# Patient Record
Sex: Male | Born: 1950
Health system: Southern US, Community
[De-identification: ages and names within clinical notes are randomized; demographics above are authoritative.]

## PROBLEM LIST (undated history)

## (undated) DIAGNOSIS — Z87438 Personal history of other diseases of male genital organs: Secondary | ICD-10-CM

## (undated) DIAGNOSIS — E871 Hypo-osmolality and hyponatremia: Secondary | ICD-10-CM

## (undated) DIAGNOSIS — K7460 Unspecified cirrhosis of liver: Secondary | ICD-10-CM

## (undated) DIAGNOSIS — E538 Deficiency of other specified B group vitamins: Secondary | ICD-10-CM

## (undated) DIAGNOSIS — F17200 Nicotine dependence, unspecified, uncomplicated: Secondary | ICD-10-CM

## (undated) DIAGNOSIS — F1911 Other psychoactive substance abuse, in remission: Secondary | ICD-10-CM

## (undated) DIAGNOSIS — M7021 Olecranon bursitis, right elbow: Secondary | ICD-10-CM

## (undated) DIAGNOSIS — M159 Polyosteoarthritis, unspecified: Secondary | ICD-10-CM

## (undated) DIAGNOSIS — I509 Heart failure, unspecified: Secondary | ICD-10-CM

## (undated) DIAGNOSIS — F329 Major depressive disorder, single episode, unspecified: Secondary | ICD-10-CM

## (undated) DIAGNOSIS — J449 Chronic obstructive pulmonary disease, unspecified: Secondary | ICD-10-CM

## (undated) DIAGNOSIS — I480 Paroxysmal atrial fibrillation: Secondary | ICD-10-CM

## (undated) DIAGNOSIS — I10 Essential (primary) hypertension: Secondary | ICD-10-CM

## (undated) DIAGNOSIS — N401 Enlarged prostate with lower urinary tract symptoms: Secondary | ICD-10-CM

## (undated) DIAGNOSIS — R5381 Other malaise: Secondary | ICD-10-CM

## (undated) DIAGNOSIS — C801 Malignant (primary) neoplasm, unspecified: Secondary | ICD-10-CM

## (undated) DIAGNOSIS — Z8719 Personal history of other diseases of the digestive system: Secondary | ICD-10-CM

## (undated) DIAGNOSIS — R45851 Suicidal ideations: Secondary | ICD-10-CM

## (undated) DIAGNOSIS — L0292 Furuncle, unspecified: Secondary | ICD-10-CM

## (undated) DIAGNOSIS — N529 Male erectile dysfunction, unspecified: Secondary | ICD-10-CM

## (undated) DIAGNOSIS — F1020 Alcohol dependence, uncomplicated: Secondary | ICD-10-CM

## (undated) DIAGNOSIS — F32A Depression, unspecified: Secondary | ICD-10-CM

## (undated) DIAGNOSIS — I5189 Other ill-defined heart diseases: Secondary | ICD-10-CM

## (undated) DIAGNOSIS — I951 Orthostatic hypotension: Secondary | ICD-10-CM

## (undated) DIAGNOSIS — N138 Other obstructive and reflux uropathy: Secondary | ICD-10-CM

## (undated) DIAGNOSIS — Z8619 Personal history of other infectious and parasitic diseases: Secondary | ICD-10-CM

## (undated) DIAGNOSIS — F419 Anxiety disorder, unspecified: Secondary | ICD-10-CM

## (undated) DIAGNOSIS — I4891 Unspecified atrial fibrillation: Secondary | ICD-10-CM

## (undated) DIAGNOSIS — R972 Elevated prostate specific antigen [PSA]: Secondary | ICD-10-CM

## (undated) DIAGNOSIS — N200 Calculus of kidney: Secondary | ICD-10-CM

## (undated) DIAGNOSIS — J45909 Unspecified asthma, uncomplicated: Secondary | ICD-10-CM

## (undated) DIAGNOSIS — M1990 Unspecified osteoarthritis, unspecified site: Secondary | ICD-10-CM

## (undated) HISTORY — DX: Male erectile dysfunction, unspecified: N52.9

## (undated) HISTORY — DX: Other ill-defined heart diseases: I51.89

## (undated) HISTORY — DX: Depression, unspecified: F32.A

## (undated) HISTORY — DX: Deficiency of other specified B group vitamins: E53.8

## (undated) HISTORY — DX: Calculus of kidney: N20.0

## (undated) HISTORY — DX: Furuncle, unspecified: L02.92

## (undated) HISTORY — DX: Alcohol dependence, uncomplicated: F10.20

## (undated) HISTORY — DX: Other malaise: R53.81

## (undated) HISTORY — DX: Orthostatic hypotension: I95.1

## (undated) HISTORY — DX: Chronic obstructive pulmonary disease, unspecified: J44.9

## (undated) HISTORY — DX: Anxiety disorder, unspecified: F41.9

## (undated) HISTORY — DX: Unspecified cirrhosis of liver: K74.60

## (undated) HISTORY — DX: Elevated prostate specific antigen (PSA): R97.20

## (undated) HISTORY — DX: Major depressive disorder, single episode, unspecified: F32.9

## (undated) HISTORY — DX: Polyosteoarthritis, unspecified: M15.9

## (undated) HISTORY — DX: Personal history of other diseases of male genital organs: Z87.438

## (undated) HISTORY — DX: Other obstructive and reflux uropathy: N40.1

## (undated) HISTORY — DX: Hypo-osmolality and hyponatremia: E87.1

## (undated) HISTORY — DX: Benign prostatic hyperplasia with lower urinary tract symptoms: N13.8

## (undated) HISTORY — DX: Nicotine dependence, unspecified, uncomplicated: F17.200

## (undated) HISTORY — PX: HIP FRACTURE SURGERY: SHX118

## (undated) HISTORY — DX: Personal history of other infectious and parasitic diseases: Z86.19

## (undated) HISTORY — DX: Suicidal ideations: R45.851

## (undated) HISTORY — DX: Unspecified atrial fibrillation: I48.91

## (undated) HISTORY — DX: Essential (primary) hypertension: I10

## (undated) HISTORY — DX: Other psychoactive substance abuse, in remission: F19.11

## (undated) HISTORY — DX: Paroxysmal atrial fibrillation: I48.0

---

## 2001-03-22 ENCOUNTER — Inpatient Hospital Stay (HOSPITAL_COMMUNITY): Admission: EM | Admit: 2001-03-22 | Discharge: 2001-03-25 | Payer: Self-pay | Admitting: *Deleted

## 2010-12-17 ENCOUNTER — Emergency Department (HOSPITAL_COMMUNITY): Payer: Self-pay

## 2010-12-17 ENCOUNTER — Emergency Department (HOSPITAL_COMMUNITY)
Admission: EM | Admit: 2010-12-17 | Discharge: 2010-12-18 | Disposition: A | Discharge status: Home / Self Care | Payer: Self-pay | Attending: Emergency Medicine | Admitting: Emergency Medicine

## 2010-12-17 DIAGNOSIS — IMO0002 Reserved for concepts with insufficient information to code with codable children: Secondary | ICD-10-CM | POA: Insufficient documentation

## 2010-12-17 DIAGNOSIS — W19XXXA Unspecified fall, initial encounter: Secondary | ICD-10-CM | POA: Insufficient documentation

## 2010-12-17 DIAGNOSIS — M7989 Other specified soft tissue disorders: Secondary | ICD-10-CM | POA: Insufficient documentation

## 2010-12-17 DIAGNOSIS — M79609 Pain in unspecified limb: Secondary | ICD-10-CM | POA: Insufficient documentation

## 2010-12-17 DIAGNOSIS — F1010 Alcohol abuse, uncomplicated: Secondary | ICD-10-CM | POA: Insufficient documentation

## 2010-12-17 DIAGNOSIS — S5010XA Contusion of unspecified forearm, initial encounter: Secondary | ICD-10-CM | POA: Insufficient documentation

## 2010-12-17 DIAGNOSIS — I10 Essential (primary) hypertension: Secondary | ICD-10-CM | POA: Insufficient documentation

## 2010-12-17 DIAGNOSIS — F341 Dysthymic disorder: Secondary | ICD-10-CM | POA: Insufficient documentation

## 2010-12-17 DIAGNOSIS — Y929 Unspecified place or not applicable: Secondary | ICD-10-CM | POA: Insufficient documentation

## 2010-12-17 LAB — POCT I-STAT, CHEM 8
Chloride: 92 mEq/L — ABNORMAL LOW (ref 96–112)
Creatinine, Ser: 1.3 mg/dL (ref 0.4–1.5)
Glucose, Bld: 91 mg/dL (ref 70–99)
Potassium: 3.7 mEq/L (ref 3.5–5.1)

## 2010-12-17 IMAGING — CR DG FOREARM 2V*R*
2 series · 2 of 2 positions shown · non-contrast
Comparison: None.

CLINICAL DATA: Fall, pain.  Laceration.

RIGHT FOREARM - 2 VIEW

[view not recorded (1 of 2)]
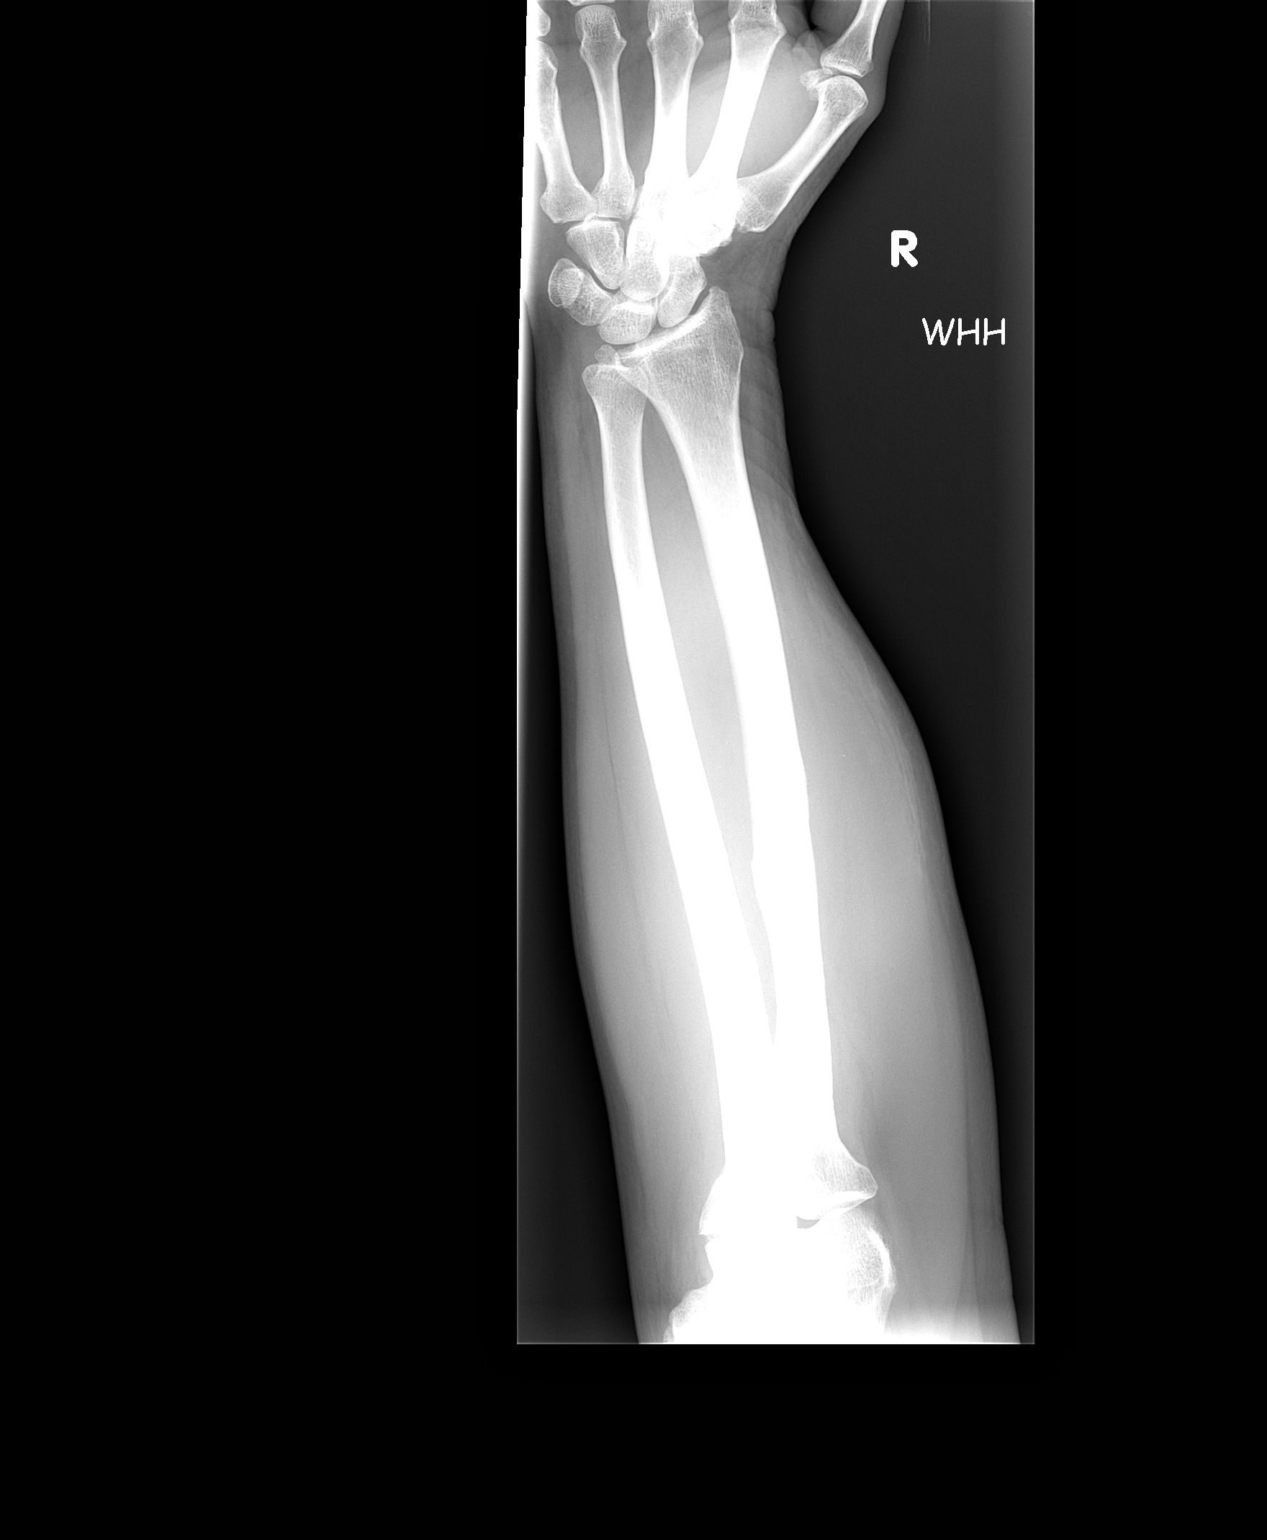

[view not recorded (2 of 2)]
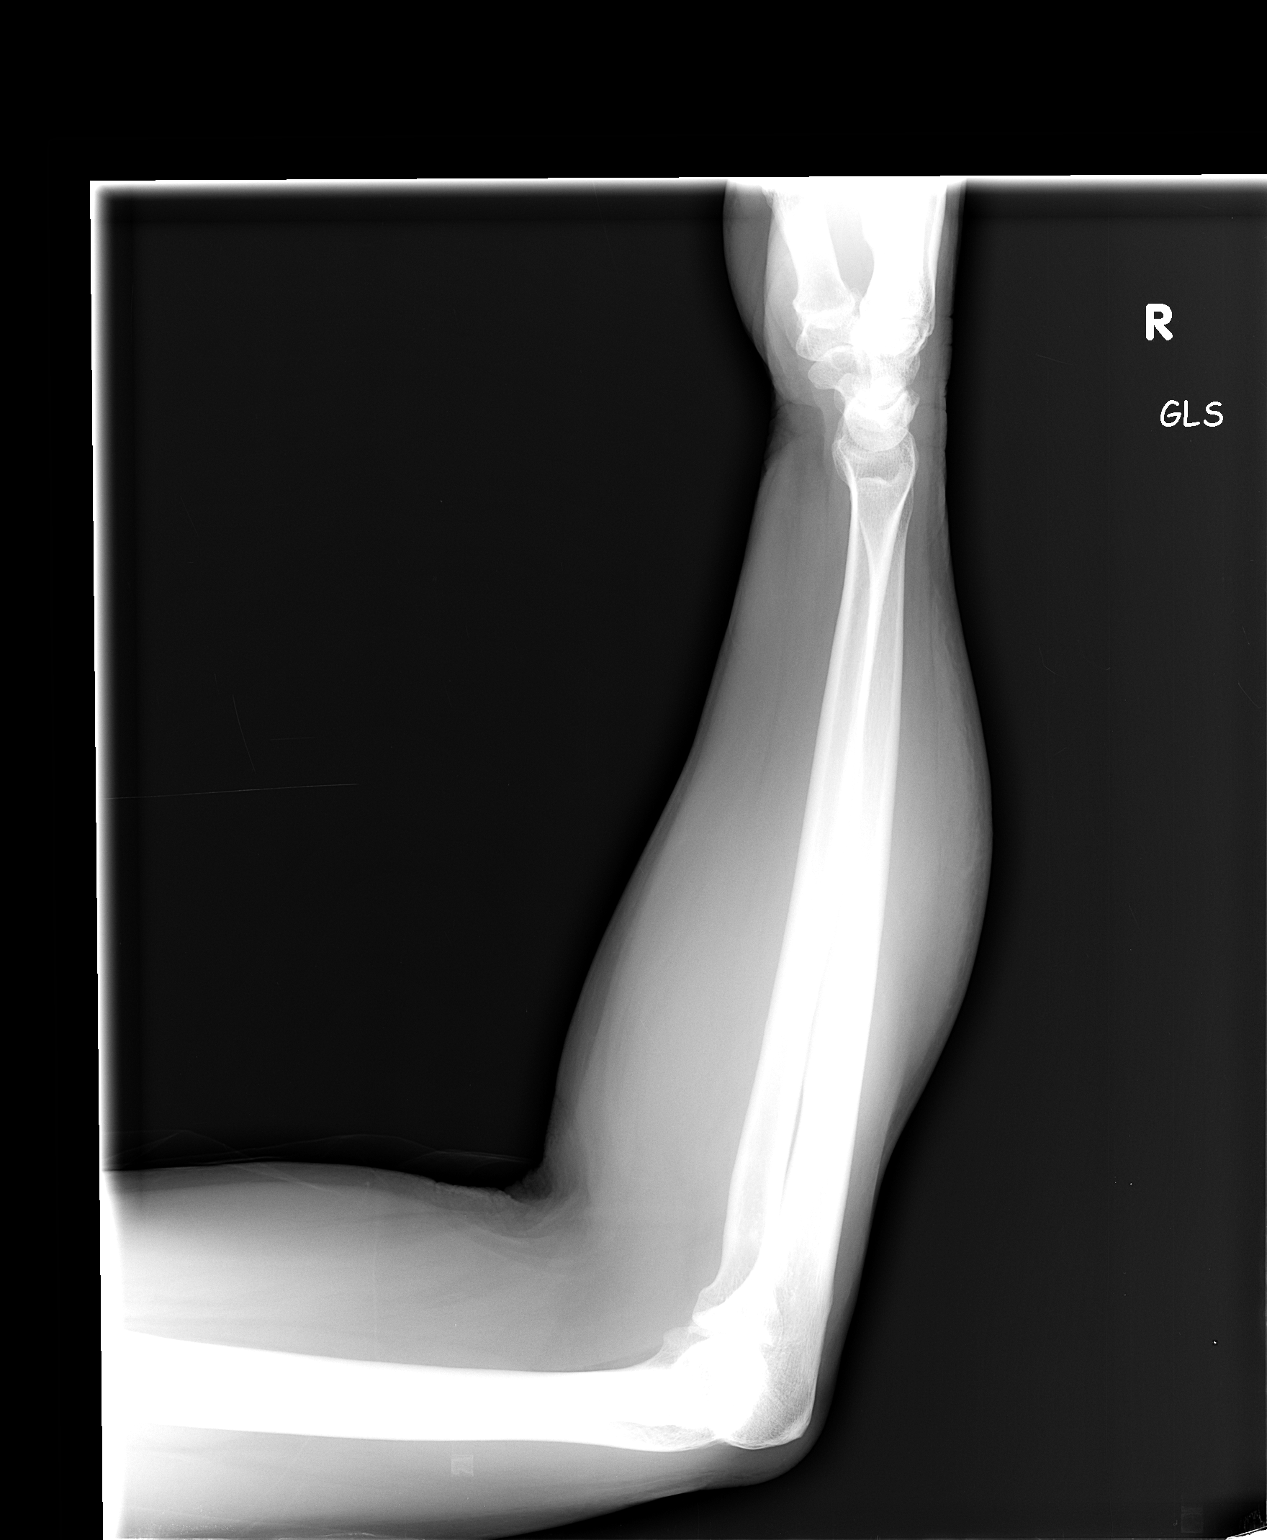

[2 of 2 positions shown; findings below may reference images not displayed]

FINDINGS: There is marked soft tissue swelling over the dorsum of
the mid forearm.  No underlying fracture or foreign body.
IMPRESSION: Soft tissue swelling.  No acute bony abnormality.

## 2012-02-06 ENCOUNTER — Ambulatory Visit (INDEPENDENT_AMBULATORY_CARE_PROVIDER_SITE_OTHER): Payer: Self-pay | Admitting: Family Medicine

## 2012-02-06 ENCOUNTER — Encounter: Payer: Self-pay | Admitting: Family Medicine

## 2012-02-06 VITALS — BP 112/77 | HR 71 | Temp 98.3°F | Wt 181.0 lb

## 2012-02-06 DIAGNOSIS — J441 Chronic obstructive pulmonary disease with (acute) exacerbation: Secondary | ICD-10-CM | POA: Insufficient documentation

## 2012-02-06 DIAGNOSIS — E538 Deficiency of other specified B group vitamins: Secondary | ICD-10-CM

## 2012-02-06 DIAGNOSIS — I10 Essential (primary) hypertension: Secondary | ICD-10-CM

## 2012-02-06 DIAGNOSIS — J449 Chronic obstructive pulmonary disease, unspecified: Secondary | ICD-10-CM | POA: Insufficient documentation

## 2012-02-06 DIAGNOSIS — J4489 Other specified chronic obstructive pulmonary disease: Secondary | ICD-10-CM

## 2012-02-06 DIAGNOSIS — F17200 Nicotine dependence, unspecified, uncomplicated: Secondary | ICD-10-CM

## 2012-02-06 MED ORDER — PREDNISONE 20 MG PO TABS: ORAL_TABLET | ORAL | Status: DC

## 2012-02-06 MED ORDER — CYANOCOBALAMIN 1000 MCG/ML IJ SOLN: 1000.0000 ug | Freq: Once | INTRAMUSCULAR | Status: DC

## 2012-02-06 MED ORDER — AZITHROMYCIN 250 MG PO TABS: ORAL_TABLET | ORAL | Status: DC

## 2012-02-06 NOTE — Assessment & Plan Note (Signed)
Improved aeration/wheezing with one albuterol/atrovent (from sample closet) neb in office today. He clearly needs steroids: rx'd 15 d prednisone taper--60 qd x 5d, then 40 qd x 5d, then 20 qd x 5d. Z-pack rx'd as well.  Continue ventolin HFA q4h prn.  Call or return if not improving in 48h.

## 2012-02-06 NOTE — Assessment & Plan Note (Signed)
Poor control, pt refusal to comply with recommendations of chronic/daily preventative meds (like inhaled steroid/bronchodilator combo, spireva, etc) due to cost.  Pt refuses to quit smoking.  I stressed the importance of quitting smoking today in no uncertain terms.  I told him his COPD is uncontrolled and the best way to have a chance of getting better is to quit smoking.  He expressed understanding but made no commitment to a quit attempt.  Will continue to encourage him, continue to work on getting him to accept a daily controller med--at least samples in the beginning and maybe patient assistance beyond that.

## 2012-02-06 NOTE — Assessment & Plan Note (Signed)
Control good as per his bp check here today. Will obtain old records. No change in meds today.  No labs today.

## 2012-02-06 NOTE — Patient Instructions (Signed)
Buy robitussin DM over the counter cough med and take as directed.

## 2012-02-06 NOTE — Progress Notes (Signed)
Office Note 02/06/2012  CC:  Chief Complaint  Patient presents with  . Establish Care    COPD flare    HPI:  Jacob Frank is a 61 y.o. White male who is here to establish care and discuss respiratory complaints. Patient's most recent primary MD: Dr. Milford Cage at [facility] in [place], [place].  I also saw him at that same office when I worked there a couple of years ago, so he is mildly familiar to me. Old records were not reviewed prior to or during today's visit.  He reports switching care to me today b/c of lack of available appt at his current office as well as frustration with that office regarding his desire for some disability paperwork to be filled out.   He also says he'll be making f/u appt soon with me to get another DOT physical (requires annual updates of this form lately due to HTN, COPD, med mgmt).  Last one was 03/21/2011 and he brought this form in today to photocopy for our records.  Pt presents complaining of respiratory symptoms for 10 + days.  Primary symptoms are: nasal congestion, cough productive of whitish sputum.  Worst symptoms seems to be the cough, wheezing, achiness.  Lately the symptoms seem to be worsening.  Feels like fatigue is setting in, wheezing and chest tightness worsening.  No fevers.  In the first couple of days of the illness he had some frequent watery BMs but these have resolved.  He has had no n/v.  Appetite down last few days.  No abd pains.    Pertinent negatives:  No pain in face or teeth, no HA or ST.  Symptoms made worse by ambulation.  Symptoms improved by ventolin.  No OTC cough med. Smoker? yes Recent sick contact? Not known Muscle or joint aches? Shoulders achy and weak. Flu shot this season at least 2 wks ago? yes  Additional ROS: no n/v/d or abdominal pain.  No rash.  No neck stiffness.   +Mild fatigue.  +Mild appetite loss.   Past Medical History  Diagnosis Date  . HTN (hypertension)   . Anxiety   .  Tobacco dependence     80-90 pack-yr hx  . History of substance abuse     cocaine and meth; pt claims he's been clean since 2005  . Vitamin B12 deficiency     hx unclear but pt states he's been getting monthly vit B12 injections and they help him feel better  . Furuncles     inner thighs; required I&D in the past  . COPD (chronic obstructive pulmonary disease)     hx of treatment with prn albuterol only due to pt refusing to pay for any daily preventative meds.  Has required many courses of systemic steroids.    History reviewed. No pertinent past surgical history.  Family History  Problem Relation Age of Onset  . Arthritis Mother   . Cirrhosis Mother   . Cancer Father     brain tumor    History   Social History  . Marital Status: Married    Spouse Name: N/A    Number of Children: N/A  . Years of Education: N/A   Occupational History  . Not on file.   Social History Main Topics  . Smoking status: Current Everyday Smoker    Types: Cigarettes  . Smokeless tobacco: Never Used  . Alcohol Use: Yes     1-2 x month (6-12 pk)  . Drug  Use: No  . Sexually Active: Not on file   Other Topics Concern  . 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  Alcohol 6-12 beers q month.Admits to being a cocaine and meth addict in the past, says he's been clean since 2005.He is unable to exercise.    Outpatient Encounter Prescriptions as of 02/06/2012  Medication Sig Dispense Refill  . albuterol (VENTOLIN HFA) 108 (90 BASE) MCG/ACT inhaler Inhale 2 puffs into the lungs every 4 (four) hours as needed.      . ALPRAZolam (XANAX) 1 MG tablet Take 1 mg by mouth 3 (three) times daily as needed.      [name] aspirin 325 MG EC tablet Take 325 mg by mouth daily.      . cyanocobalamin (,VITAMIN B-12,) 1000 MCG/ML injection Inject 1,000 mcg into the  muscle every 30 (thirty) days.      [name] FLUoxetine (PROZAC) 20 MG capsule Take 20 mg by mouth daily.      [name] lisinopril-hydrochlorothiazide (PRINZIDE,ZESTORETIC) 20-25 MG per tablet Take 1 tablet by mouth daily.      [name] azithromycin (ZITHROMAX) 250 MG tablet 2 tabs po qd x 1d, then 1 tab po qd x 4d  6 each  0  . predniSONE (DELTASONE) 20 MG tablet 3 tabs po qd x 5d, then 2 tabs po qd x 5d, then 1 tab po qd x 5d, then stop  30 tablet  0   Facility-Administered Encounter Medications as of 02/06/2012  Medication Dose Route Frequency Provider Last Rate Last Dose  . cyanocobalamin ((VITAMIN B-12)) injection 1,000 mcg  1,000 mcg Intramuscular Once Jeoffrey Massed, MD        No Known Allergies  ROS Review of Systems  Constitutional: Positive for fatigue. Negative for fever.  HENT: Positive for congestion. Negative for sore throat and sinus pressure.   Eyes: Negative for visual disturbance.  Respiratory: Positive for cough, chest tightness, shortness of breath and wheezing.   Cardiovascular: Negative for chest pain.  Gastrointestinal: Negative for nausea, vomiting and abdominal pain.  Genitourinary: Negative for dysuria.  Musculoskeletal: Positive for myalgias. Negative for back pain and joint swelling.  Skin: Negative for rash.  Neurological: Positive for weakness (generalized). Negative for headaches.  Hematological: Negative for adenopathy.    PE; Blood pressure 112/77, pulse 71, temperature 98.3 F (36.8 C), temperature source Temporal, weight 181 lb (82.101 kg), SpO2 95.00%. VS: noted--normal. Gen: alert, NAD, NONTOXIC APPEARING. HEENT: eyes without injection, drainage, or swelling.  Ears: EACs clear, TMs with normal light reflex and landmarks.  Nose: no rhinorrhea noted.  Nasal mucosa minimally injected and edematous.  No paranasal sinus TTP.  No facial swelling.  Throat and mouth without focal lesion.  No pharyngial swelling, erythema, or exudate.   Neck: supple, no LAD.  Carotids 2+  bilat. LUNGS:  Some inspiratory rhonchi are heard diffusely, with prominent expiratory wheezing and prolonged exp phase heard in all lung fields, aeration moderately diminished on expiration, breathing is nonlabored.   CV: RRR, no m/r/g. ABD: soft, NT, ND, BS normal.  No hepatospenomegaly or mass.  No bruits. EXT: no c/c/e SKIN: no rash  Pertinent labs:  None today  ASSESSMENT AND PLAN:   COPD exacerbation Improved aeration/wheezing with one albuterol/atrovent (from sample closet) neb in office today. He clearly needs steroids: rx'd 15 d prednisone taper--60  qd x 5d, then 40 qd x 5d, then 20 qd x 5d. Z-pack rx'd as well.  Continue ventolin HFA q4h prn.  Call or return if not improving in 48h.  COPD (chronic obstructive pulmonary disease) Poor control, pt refusal to comply with recommendations of chronic/daily preventative meds (like inhaled steroid/bronchodilator combo, spireva, etc) due to cost.  Pt refuses to quit smoking.  I stressed the importance of quitting smoking today in no uncertain terms.  I told him his COPD is uncontrolled and the best way to have a chance of getting better is to quit smoking.  He expressed understanding but made no commitment to a quit attempt.  Will continue to encourage him, continue to work on getting him to accept a daily controller med--at least samples in the beginning and maybe patient assistance beyond that.  HTN (hypertension), benign Control good as per his bp check here today. Will obtain old records. No change in meds today.  No labs today.  Tobacco dependence Spent 3-10 minutes today discussing pt's smoking habit and the short and long term risks of smoking.  I clearly advised patient to completely quit smoking.   Vitamin B12 deficiency Unclear whether he has a documented hx of vit B12 deficiency or maybe these were started simply as a potential energy boost/aid. Will obtain old records.  Gave 1000 mcg dose IM here today.     Return for  about 1 mo f/u for DOT physical.

## 2012-02-06 NOTE — Assessment & Plan Note (Signed)
Spent 3-10 minutes today discussing pt's smoking habit and the short and long term risks of smoking.  I clearly advised patient to completely quit smoking.  

## 2012-02-06 NOTE — Assessment & Plan Note (Signed)
Unclear whether he has a documented hx of vit B12 deficiency or maybe these were started simply as a potential energy boost/aid. Will obtain old records.  Gave 1000 mcg dose IM here today.

## 2012-02-14 ENCOUNTER — Encounter: Payer: Self-pay | Admitting: Family Medicine

## 2012-02-29 ENCOUNTER — Ambulatory Visit (INDEPENDENT_AMBULATORY_CARE_PROVIDER_SITE_OTHER): Payer: Self-pay | Admitting: *Deleted

## 2012-02-29 ENCOUNTER — Other Ambulatory Visit: Payer: Self-pay | Admitting: Family Medicine

## 2012-02-29 DIAGNOSIS — E538 Deficiency of other specified B group vitamins: Secondary | ICD-10-CM

## 2012-02-29 MED ORDER — CYANOCOBALAMIN 1000 MCG/ML IJ SOLN
1000.0000 ug | Freq: Once | INTRAMUSCULAR | Status: AC
  Administered 2012-02-29: 1000 ug via INTRAMUSCULAR

## 2012-02-29 MED ORDER — ALPRAZOLAM 1 MG PO TABS: 1.0000 mg | ORAL_TABLET | Freq: Three times a day (TID) | ORAL | Status: DC | PRN

## 2012-02-29 NOTE — Progress Notes (Signed)
Pt arrived in office accompanying his mother and requesting B12 injection.  OK per Dr. Milinda Cave.  Pt was advised we will do this time as it is more convenient, but we will not routinely administer early.  Pt tolerated injection well in right deltoid.  Pt was given written RX for alprazolam.

## 2012-03-31 ENCOUNTER — Other Ambulatory Visit: Payer: Self-pay | Admitting: Family Medicine

## 2012-03-31 NOTE — Telephone Encounter (Signed)
eScribe request for refill on Albuterol MDI Last seen on 02/06/12 Follow up needed in one month for DOT physical, otherwise no follow up noted RX sent

## 2012-04-16 ENCOUNTER — Telehealth: Payer: Self-pay | Admitting: Family Medicine

## 2012-04-16 NOTE — Telephone Encounter (Signed)
Advised pt he would need to be seen to get steroid RX.  appt made for Monday 7/22.

## 2012-04-16 NOTE — Telephone Encounter (Signed)
Patient just got back from [place] and had a hard time breathing due to the heat. He is getting ready to drive back out, can he get a steroid? Patient could not recall the name of the med but said that Dr Milinda Cave knows what kind works for him.

## 2012-04-21 ENCOUNTER — Encounter: Payer: Self-pay | Admitting: Family Medicine

## 2012-04-21 ENCOUNTER — Ambulatory Visit (INDEPENDENT_AMBULATORY_CARE_PROVIDER_SITE_OTHER): Payer: Self-pay | Admitting: Family Medicine

## 2012-04-21 VITALS — BP 125/79 | HR 84 | Temp 98.2°F | Wt 183.0 lb

## 2012-04-21 DIAGNOSIS — R310 Gross hematuria: Secondary | ICD-10-CM

## 2012-04-21 DIAGNOSIS — N419 Inflammatory disease of prostate, unspecified: Secondary | ICD-10-CM

## 2012-04-21 DIAGNOSIS — J449 Chronic obstructive pulmonary disease, unspecified: Secondary | ICD-10-CM

## 2012-04-21 DIAGNOSIS — E538 Deficiency of other specified B group vitamins: Secondary | ICD-10-CM

## 2012-04-21 LAB — POCT URINALYSIS DIPSTICK
Glucose, UA: NEGATIVE
Ketones, UA: NEGATIVE
Leukocytes, UA: NEGATIVE
Spec Grav, UA: 1.01
Urobilinogen, UA: 0.2

## 2012-04-21 MED ORDER — CYANOCOBALAMIN 1000 MCG/ML IJ SOLN: 1000.0000 ug | Freq: Once | INTRAMUSCULAR | Status: DC

## 2012-04-21 MED ORDER — SULFAMETHOXAZOLE-TMP DS 800-160 MG PO TABS: ORAL_TABLET | ORAL | Status: DC

## 2012-04-21 NOTE — Progress Notes (Signed)
OFFICE NOTE  04/21/2012  CC:  Chief Complaint  Patient presents with  . Breathing Problem    PC on 7/17 stating breathing problems due to heat     HPI: Patient is a 61 y.o. Caucasian male who is here for recent groin region pain and today had gross blood in urine x 2.  Has been doing a lot of long distance trucking lately, "bouncing around a lot" in truck.  Has had acute prostatitis before but never with gross hematuria.  No fever, no dysuria, no flank pain.  Says his breathing is at baseline--no change in dyspnea, cough, or sputum production.  He has chronic bronchitis/COPD and continues to smoke.  Sounds like he uses albuterol pretty regularly.  Pertinent PMH:  Past Medical History  Diagnosis Date  . HTN (hypertension)   . Anxiety and depression   . Tobacco dependence     80-90 pack-yr hx  . History of substance abuse     cocaine and meth + IV drug use; pt claims he's been clean since 2005  . Vitamin B12 deficiency     hx unclear but pt states he's been getting monthly vit B12 injections and they help him feel better  . Furuncles     inner thighs; required I&D in the past  . COPD (chronic obstructive pulmonary disease)     hx of treatment with prn albuterol only due to pt refusing to pay for any daily preventative meds.  Has required many courses of systemic steroids.  . Erectile dysfunction   . History of acute prostatitis   . Hearing loss     ? 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  . History of hepatitis Distant past    Hep B surface antigen and antibody NEG and Hep C antibody testing neg; transaminases ok.    MEDS:  Outpatient Prescriptions Prior to Visit  Medication Sig Dispense Refill  . ALPRAZolam (XANAX) 1 MG tablet Take 1 tablet (1 mg total) by mouth 3 (three) times daily as needed.  90 tablet  2  . aspirin 325 MG EC tablet Take 325 mg by mouth daily.      [name] azithromycin (ZITHROMAX) 250 MG tablet 2 tabs po qd x 1d, then 1 tab po qd x 4d  6  each  0  . cyanocobalamin (,VITAMIN B-12,) 1000 MCG/ML injection Inject 1,000 mcg into the muscle every 30 (thirty) days.      [name] FLUoxetine (PROZAC) 20 MG capsule Take 20 mg by mouth daily.      [name] lisinopril-hydrochlorothiazide (PRINZIDE,ZESTORETIC) 20-25 MG per tablet Take 1 tablet by mouth daily.      . predniSONE (DELTASONE) 20 MG tablet 3 tabs po qd x 5d, then 2 tabs po qd x 5d, then 1 tab po qd x 5d, then stop  30 tablet  0  . VENTOLIN HFA 108 (90 BASE) MCG/ACT inhaler 2 PUFFS EVERY FOUR HOURS AS NEEDED FOR WHEEZING  18 g  1  **Note: currently not on prednisone and zithromax as is listed above.  PE: Blood pressure 125/79, pulse 84, temperature 98.2 F (36.8 C), temperature source Temporal, weight 183 lb (83.008 kg), SpO2 94.00%. Gen: Alert,  NAD.  Patient is oriented to person, place, time, and situation. DRE: no anal lesions, no palbable mass in anal vault.  He has a moderately sized prostate gland with diffuse tenderness to palpation and mild induration felt diffusely, right side>left.  No distinct nodule palpable.   Genitals normal; both  testes normal without tenderness, masses, hydroceles, varicoceles, erythema or swelling. Shaft normal, circumcised, meatus normal without discharge. No inguinal hernia noted. No inguinal lymphadenopathy.  LABS: CC UA today showed small blood and was otherwise normal.  IMPRESSION AND PLAN:  Vitamin B12 deficiency Vit B12 1000 mcg IM given today.  Prostatitis Send urine for c/s. Start bactrim 500mg  bid x 14d. Return for recheck of prostate exam to make sure sx's have improved/resolved and the induration of his prostate gland is gone.  COPD (chronic obstructive pulmonary disease) With ongoing tobacco dependence. No acute changes to support dx of acute exacerbation. I added tudorza 1 puff bid today (#2 samples given today)--nurse did inhaler education in office today.  Therapeutic expectations and side effect profile of medication discussed today.   Patient's questions answered. He may continue q4h prn albuterol inhaler. Encouraged smoking cessation but he is not contemplating it at this time.     FOLLOW UP: 3-4 wks, earlier if not improving.

## 2012-04-21 NOTE — Assessment & Plan Note (Signed)
Vit B12 1000 mcg IM given today.

## 2012-04-22 ENCOUNTER — Encounter: Payer: Self-pay | Admitting: Family Medicine

## 2012-04-22 ENCOUNTER — Telehealth: Payer: Self-pay | Admitting: Family Medicine

## 2012-04-22 NOTE — Telephone Encounter (Signed)
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

## 2012-04-22 NOTE — Telephone Encounter (Signed)
Caller: Ethon/Pt; Phone Number: [phone]; Message from caller: Jacob Frank would like a note faxed to his work so he may be out of work through 04/24/12 due to his illness.  Pls fax to [employer]: [employer]  [employer].  Fax [phone]. Pls call Humphrey when this is done.

## 2012-04-22 NOTE — Telephone Encounter (Signed)
Letter faxed as requested and pt notified

## 2012-04-22 NOTE — Telephone Encounter (Signed)
Letter done-PM 

## 2012-04-22 NOTE — Telephone Encounter (Signed)
Dr. Milinda Cave, is this note OK?

## 2012-04-23 DIAGNOSIS — N419 Inflammatory disease of prostate, unspecified: Secondary | ICD-10-CM | POA: Insufficient documentation

## 2012-04-23 DIAGNOSIS — R310 Gross hematuria: Secondary | ICD-10-CM | POA: Insufficient documentation

## 2012-04-23 LAB — URINE CULTURE
Colony Count: NO GROWTH
Organism ID, Bacteria: NO GROWTH

## 2012-04-23 NOTE — Assessment & Plan Note (Signed)
With ongoing tobacco dependence. No acute changes to support dx of acute exacerbation. I added tudorza 1 puff bid today (#2 samples given today)--nurse did inhaler education in office today.  Therapeutic expectations and side effect profile of medication discussed today.  Patient's questions answered. He may continue q4h prn albuterol inhaler. Encouraged smoking cessation but he is not contemplating it at this time.

## 2012-04-23 NOTE — Assessment & Plan Note (Signed)
Send urine for c/s. Start bactrim 500mg  bid x 14d. Return for recheck of prostate exam to make sure sx's have improved/resolved and the induration of his prostate gland is gone.

## 2012-05-12 ENCOUNTER — Ambulatory Visit (INDEPENDENT_AMBULATORY_CARE_PROVIDER_SITE_OTHER): Payer: Self-pay | Admitting: Family Medicine

## 2012-05-12 ENCOUNTER — Encounter: Payer: Self-pay | Admitting: Family Medicine

## 2012-05-12 VITALS — BP 146/90 | HR 99 | Ht 69.0 in | Wt 185.0 lb

## 2012-05-12 DIAGNOSIS — N419 Inflammatory disease of prostate, unspecified: Secondary | ICD-10-CM

## 2012-05-12 DIAGNOSIS — R319 Hematuria, unspecified: Secondary | ICD-10-CM

## 2012-05-12 DIAGNOSIS — Z125 Encounter for screening for malignant neoplasm of prostate: Secondary | ICD-10-CM

## 2012-05-12 DIAGNOSIS — R972 Elevated prostate specific antigen [PSA]: Secondary | ICD-10-CM

## 2012-05-12 LAB — URINALYSIS, ROUTINE W REFLEX MICROSCOPIC
Hgb urine dipstick: NEGATIVE
Ketones, ur: NEGATIVE
Leukocytes, UA: NEGATIVE
Specific Gravity, Urine: 1.015 (ref 1.000–1.030)
Urobilinogen, UA: 0.2 (ref 0.0–1.0)

## 2012-05-12 NOTE — Progress Notes (Signed)
OFFICE NOTE  05/12/2012  CC:  Chief Complaint  Patient presents with  . Follow-up    Prostate, possible urine test  . Medication Management    Refills on BP and other prescriptions.     HPI: Patient is a 61 y.o. Caucasian male who is here for 2 wk f/u for acute prostatitis with gross hematuria, he is now s/p 2 wk course of bactrim and took some time off work.  No more discomfort or pain, no more gross blood.    Pertinent PMH:  Past Medical History  Diagnosis Date  . HTN (hypertension)   . Anxiety and depression   . Tobacco dependence     80-90 pack-yr hx  . History of substance abuse     cocaine and meth + IV drug use; pt claims he's been clean since 2005  . Vitamin B12 deficiency     hx unclear but pt states he's been getting monthly vit B12 injections and they help him feel better  . Furuncles     inner thighs; required I&D in the past  . COPD (chronic obstructive pulmonary disease)     hx of treatment with prn albuterol only due to pt refusing to pay for any daily preventative meds.  Has required many courses of systemic steroids.  . Erectile dysfunction   . History of acute prostatitis   . Hearing loss     ? 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  . History of hepatitis Distant past    Hep B surface antigen and antibody NEG and Hep C antibody testing neg; transaminases ok.    MEDS:  Outpatient Prescriptions Prior to Visit  Medication Sig Dispense Refill  . ALPRAZolam (XANAX) 1 MG tablet Take 1 tablet (1 mg total) by mouth 3 (three) times daily as needed.  90 tablet  2  . aspirin 325 MG EC tablet Take 325 mg by mouth daily.      . cyanocobalamin (,VITAMIN B-12,) 1000 MCG/ML injection Inject 1,000 mcg into the muscle every 30 (thirty) days.      [name] FLUoxetine (PROZAC) 20 MG capsule Take 20 mg by mouth daily.      [name] lisinopril-hydrochlorothiazide (PRINZIDE,ZESTORETIC) 20-25 MG per tablet Take 1 tablet by mouth daily.      . VENTOLIN HFA 108 (90  BASE) MCG/ACT inhaler 2 PUFFS EVERY FOUR HOURS AS NEEDED FOR WHEEZING  18 g  1  . sulfamethoxazole-trimethoprim (BACTRIM DS) 800-160 MG per tablet 1 tab po bid x 14d  28 tablet  0    PE: Blood pressure 146/90, pulse 99, height 5\' 9"  (1.753 m), weight 185 lb (83.915 kg), SpO2 99.00%. Gen: Alert, well appearing.  Patient is oriented to person, place, time, and situation. DRE today: prostate without tenderness.  Moderate sized, right lobe minimally firm and large compared to left.  Brown stool wipings in rectal vault.  No mass.  IMPRESSION AND PLAN:  Prostatitis Resolved with 2 wks of bactrim. Will check urine today to make sure his hematuria has resolved.  Prostate cancer screening Check PSA today.  I feel reassured that his prostate exam is better today compared to when he had acute prostatitis.     FOLLOW UP: 50mo for CPE

## 2012-05-12 NOTE — Assessment & Plan Note (Signed)
Check PSA today.  I feel reassured that his prostate exam is better today compared to when he had acute prostatitis.

## 2012-05-12 NOTE — Assessment & Plan Note (Signed)
Resolved with 2 wks of bactrim. Will check urine today to make sure his hematuria has resolved.

## 2012-05-13 ENCOUNTER — Telehealth: Payer: Self-pay | Admitting: *Deleted

## 2012-05-13 NOTE — Telephone Encounter (Signed)
Called number we had on file for [place] and it was disconnected.  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   I advised her there was no sign of blood in the urine.  I did advise his prostate blood test was elevated.  I explained that sometmes a recent infection can cause this.  I told her to have him call in 1 month and make a lab appointment and Dr. Milinda Cave will enter the order.  If it is still elevated then he needs to see a urologist to get evaluated for possible prostate cancer.  It if comes back down to normal then we will keep a close eye on it for a while. (  PSA test every 6 mo for a year or two.)

## 2012-05-13 NOTE — Addendum Note (Signed)
Addended by: Jeoffrey Massed on: 05/13/2012 08:21 AM   Modules accepted: Orders

## 2012-05-13 NOTE — Telephone Encounter (Signed)
Message copied by Derry Skill on Tue May 13, 2012 10:25 AM ------      Message from: Jeoffrey Massed      Created: Tue May 13, 2012  8:20 AM       Pls notify pt that his urine no longer had any sign of blood in it.  However, his prostate blood test was elevated.  Sometimes this is because of recent infection like he had, so I want to wait 1 month and then repeat it (have him make appt for lab visit in 1 mo and I'll enter the order).  If it is still elevated then he needs to see a urologist to get evaluated for possible prostate cancer.  If it comes back down to normal, then we'll simply keep a close eye on it for a while (PSA every 107mo for a year or two).  --thx

## 2012-05-21 ENCOUNTER — Other Ambulatory Visit: Payer: Self-pay | Admitting: Family Medicine

## 2012-05-21 NOTE — Telephone Encounter (Signed)
eScribe request for refill on ALPRAZOLAM Last filled - 02/29/12, 90 X 2  Last seen on - 05/12/12 Follow up - 6 MONTHS FOR CPE Please advise refills.  eScribe request for refill on ZESTORETIC Last filled - never filled by our office Last seen on - 05/12/12 Follow up - 6 MONTHS FOR CPE  RX sent

## 2012-05-21 NOTE — Telephone Encounter (Signed)
RX faxed

## 2012-05-23 ENCOUNTER — Encounter: Payer: Self-pay | Admitting: Family Medicine

## 2012-05-23 ENCOUNTER — Encounter: Payer: Self-pay | Admitting: *Deleted

## 2012-05-23 ENCOUNTER — Ambulatory Visit (INDEPENDENT_AMBULATORY_CARE_PROVIDER_SITE_OTHER): Payer: Self-pay | Admitting: Family Medicine

## 2012-05-23 VITALS — BP 120/83 | HR 75 | Ht 69.0 in | Wt 180.0 lb

## 2012-05-23 DIAGNOSIS — N419 Inflammatory disease of prostate, unspecified: Secondary | ICD-10-CM

## 2012-05-23 DIAGNOSIS — R310 Gross hematuria: Secondary | ICD-10-CM

## 2012-05-23 DIAGNOSIS — R300 Dysuria: Secondary | ICD-10-CM

## 2012-05-23 DIAGNOSIS — E538 Deficiency of other specified B group vitamins: Secondary | ICD-10-CM

## 2012-05-23 DIAGNOSIS — R972 Elevated prostate specific antigen [PSA]: Secondary | ICD-10-CM

## 2012-05-23 LAB — CBC WITH DIFFERENTIAL/PLATELET
Basophils Absolute: 0.1 10*3/uL (ref 0.0–0.1)
Eosinophils Absolute: 0.1 10*3/uL (ref 0.0–0.7)
Lymphocytes Relative: 29.3 % (ref 12.0–46.0)
MCHC: 33.6 g/dL (ref 30.0–36.0)
MCV: 97.3 fl (ref 78.0–100.0)
Monocytes Absolute: 0.5 10*3/uL (ref 0.1–1.0)
Neutrophils Relative %: 62.5 % (ref 43.0–77.0)
Platelets: 231 10*3/uL (ref 150.0–400.0)
RDW: 13.8 % (ref 11.5–14.6)

## 2012-05-23 LAB — URINALYSIS, ROUTINE W REFLEX MICROSCOPIC
Ketones, ur: NEGATIVE
Specific Gravity, Urine: 1.025 (ref 1.000–1.030)
Total Protein, Urine: NEGATIVE
Urine Glucose: NEGATIVE
Urobilinogen, UA: 0.2 (ref 0.0–1.0)
pH: 6 (ref 5.0–8.0)

## 2012-05-23 LAB — COMPREHENSIVE METABOLIC PANEL
ALT: 12 U/L (ref 0–53)
AST: 16 U/L (ref 0–37)
Albumin: 4 g/dL (ref 3.5–5.2)
Alkaline Phosphatase: 36 U/L — ABNORMAL LOW (ref 39–117)
BUN: 14 mg/dL (ref 6–23)
Calcium: 8.9 mg/dL (ref 8.4–10.5)
Chloride: 99 mEq/L (ref 96–112)
Potassium: 3.9 mEq/L (ref 3.5–5.1)

## 2012-05-23 LAB — PSA: PSA: 7.71 ng/mL — ABNORMAL HIGH (ref 0.10–4.00)

## 2012-05-23 MED ORDER — CYANOCOBALAMIN 1000 MCG/ML IJ SOLN
1000.0000 ug | INTRAMUSCULAR | Status: DC
  Administered 2012-05-23: 1000 ug via INTRAMUSCULAR

## 2012-05-23 MED ORDER — FLUOXETINE HCL 20 MG PO CAPS: 20.0000 mg | ORAL_CAPSULE | Freq: Every day | ORAL | Status: DC

## 2012-05-23 MED ORDER — HYDROCODONE-ACETAMINOPHEN 5-325 MG PO TABS: ORAL_TABLET | ORAL | Status: DC

## 2012-05-23 MED ORDER — CYANOCOBALAMIN 1000 MCG/ML IJ SOLN: 1000.0000 ug | Freq: Once | INTRAMUSCULAR | Status: DC

## 2012-05-23 MED ORDER — ALPRAZOLAM 1 MG PO TABS: ORAL_TABLET | ORAL | Status: DC

## 2012-05-23 MED ORDER — SULFAMETHOXAZOLE-TRIMETHOPRIM 800-160 MG PO TABS: ORAL_TABLET | ORAL | Status: DC

## 2012-05-23 NOTE — Progress Notes (Signed)
OFFICE NOTE  05/23/2012  CC:  Chief Complaint  Patient presents with  . Hematuria     HPI: Patient is a 61 y.o. Caucasian male who is here for 11 day f/u for recent prostatitis with gross hematuria and elevated PSA level.  This had responded 100% to a 2 wk course of bactrim and he was able to return to work as a [employer].   Now he has had rectal pain/prostate pain again for several days, last 2-3 days has had gross hematuria, dysuria, hurting in bottom and both sides/flanks.  No fever.  +Generalized weakness.  No nausea.  Appetite is fine.  No dysuria, no rectal bleeding or melena or constipation.  Pertinent PMH:  Past Medical History  Diagnosis Date  . HTN (hypertension)   . Anxiety and depression   . Tobacco dependence     80-90 pack-yr hx  . History of substance abuse     cocaine and meth + IV drug use; pt claims he's been clean since 2005  . Vitamin B12 deficiency     hx unclear but pt states he's been getting monthly vit B12 injections and they help him feel better  . Furuncles     inner thighs; required I&D in the past  . COPD (chronic obstructive pulmonary disease)     hx of treatment with prn albuterol only due to pt refusing to pay for any daily preventative meds.  Has required many courses of systemic steroids.  . Erectile dysfunction   . History of acute prostatitis   . Hearing loss     ? 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  . History of hepatitis Distant past    Hep B surface antigen and antibody NEG and Hep C antibody testing neg; transaminases ok.    MEDS:  Outpatient Prescriptions Prior to Visit  Medication Sig Dispense Refill  . ALPRAZolam (XANAX) 1 MG tablet TAKE ONE THREE TIMES DAILY AS NEEDED  90 tablet  2  . aspirin 325 MG EC tablet Take 325 mg by mouth daily.      . cyanocobalamin (,VITAMIN B-12,) 1000 MCG/ML injection Inject 1,000 mcg into the muscle every 30 (thirty) days.      [name] FLUoxetine (PROZAC) 20 MG capsule Take 20 mg  by mouth daily.      . VENTOLIN HFA 108 (90 BASE) MCG/ACT inhaler 2 PUFFS EVERY FOUR HOURS AS NEEDED FOR WHEEZING  18 g  1  . ZESTORETIC 20-25 MG per tablet TAKE ONE TABLET DAILY.  30 each  6   No facility-administered medications prior to visit.    PE: Blood pressure 120/83, pulse 75, height 5\' 9"  (1.753 m), weight 180 lb (81.647 kg). Gen: Alert, well appearing, makes some intermittent groaning/grunting sounds as if in pain, but NAD.  Patient is oriented to person, place, time, and situation. Rectal: Rectal exam: PROSTATE EXAM: smooth and symmetric without nodule or signifiant generalized induration.  Very tender to palpation diffusely over prostate gland.  Prostate size wnl.  IMPRESSION AND PLAN:  Prostatitis His clinical picture fits this, but will likely get him to urologist in the near future (he wants to wait until his insurance "kicks in" after 06/01/12 to get eval to make sure bladder and prostate look ok from a malignancy standpoint. Sent UA with micro to lab today as well as a repeat PSA. Restart bactrim DS 1 tab bid x 14d Note for work/excuse written today.     FOLLOW UP: 2 wks in  office

## 2012-05-24 LAB — GC/CHLAMYDIA PROBE AMP, URINE: Chlamydia, Swab/Urine, PCR: NEGATIVE

## 2012-05-25 LAB — URINE CULTURE
Colony Count: NO GROWTH
Organism ID, Bacteria: NO GROWTH

## 2012-05-26 ENCOUNTER — Ambulatory Visit: Payer: Self-pay | Admitting: Family Medicine

## 2012-05-26 NOTE — Assessment & Plan Note (Signed)
His clinical picture fits this, but will likely get him to urologist in the near future (he wants to wait until his insurance "kicks in" after 06/01/12 to get eval to make sure bladder and prostate look ok from a malignancy standpoint. Sent UA with micro to lab today as well as a repeat PSA. Restart bactrim DS 1 tab bid x 14d Note for work/excuse written today.

## 2012-06-06 ENCOUNTER — Ambulatory Visit (INDEPENDENT_AMBULATORY_CARE_PROVIDER_SITE_OTHER): Payer: BC Managed Care – PPO | Admitting: Family Medicine

## 2012-06-06 ENCOUNTER — Encounter: Payer: Self-pay | Admitting: Family Medicine

## 2012-06-06 VITALS — BP 88/60 | HR 84 | Ht 69.0 in | Wt 176.0 lb

## 2012-06-06 DIAGNOSIS — R310 Gross hematuria: Secondary | ICD-10-CM

## 2012-06-06 DIAGNOSIS — N419 Inflammatory disease of prostate, unspecified: Secondary | ICD-10-CM

## 2012-06-06 NOTE — Assessment & Plan Note (Signed)
Resolved. I would like to check his PSA level when he is NOT in the midst of an infection. He'll return in 1 mo for lab visit for PSA and UA.

## 2012-06-06 NOTE — Progress Notes (Signed)
OFFICE NOTE  06/06/2012  CC:  Chief Complaint  Patient presents with  . Follow-up    urine,      HPI: Patient is a 61 y.o. Caucasian male who is here for recheck prostatitis. He reports feeling well.  All blood in urine and pain resolved not long after he started taking the abx I rx'd for him 05/23/12.  He has finished this med.  No dysuria, no urinary hesitancy, no frequency, no suprapubic or rectal pain.  Hurts a bit in muscles of mid/upper back.  Pertinent PMH:  Past Medical History  Diagnosis Date  . HTN (hypertension)   . Anxiety and depression   . Tobacco dependence     80-90 pack-yr hx  . History of substance abuse     cocaine and meth + IV drug use; pt claims he's been clean since 2005  . Vitamin B12 deficiency     hx unclear but pt states he's been getting monthly vit B12 injections and they help him feel better  . Furuncles     inner thighs; required I&D in the past  . COPD (chronic obstructive pulmonary disease)     hx of treatment with prn albuterol only due to pt refusing to pay for any daily preventative meds.  Has required many courses of systemic steroids.  . Erectile dysfunction   . History of acute prostatitis   . Hearing loss     ? 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  . History of hepatitis Distant past    Hep B surface antigen and antibody NEG and Hep C antibody testing neg; transaminases ok.    MEDS:  Outpatient Prescriptions Prior to Visit  Medication Sig Dispense Refill  . ALPRAZolam (XANAX) 1 MG tablet 1 tab po tid prn  90 tablet  2  . aspirin 325 MG EC tablet Take 325 mg by mouth daily.      . cyanocobalamin (,VITAMIN B-12,) 1000 MCG/ML injection Inject 1,000 mcg into the muscle every 30 (thirty) days.      [name] FLUoxetine (PROZAC) 20 MG capsule Take 1 capsule (20 mg total) by mouth daily.  30 capsule  6  . HYDROcodone-acetaminophen (NORCO/VICODIN) 5-325 MG per tablet 1-2 tabs po q6h prn pain  30 tablet  1  . VENTOLIN HFA 108  (90 BASE) MCG/ACT inhaler 2 PUFFS EVERY FOUR HOURS AS NEEDED FOR WHEEZING  18 g  1  . ZESTORETIC 20-25 MG per tablet TAKE ONE TABLET DAILY.  30 each  6  . sulfamethoxazole-trimethoprim (BACTRIM DS,SEPTRA DS) 800-160 MG per tablet 1 tab po bid x 14d  28 tablet  0    PE: Blood pressure 88/60, pulse 84, height 5\' 9"  (1.753 m), weight 176 lb (79.833 kg). Gen: Alert, well appearing.  Patient is oriented to person, place, time, and situation. No further exam today.  IMPRESSION AND PLAN:  Prostatitis Resolved. I would like to check his PSA level when he is NOT in the midst of an infection. He'll return in 1 mo for lab visit for PSA and UA.     FOLLOW UP: 14mo for lab visit and 4-6 mo for office visit.

## 2012-07-04 ENCOUNTER — Other Ambulatory Visit: Payer: BC Managed Care – PPO

## 2012-08-27 ENCOUNTER — Other Ambulatory Visit: Payer: Self-pay | Admitting: Family Medicine

## 2012-08-27 NOTE — Telephone Encounter (Signed)
RX faxed

## 2012-08-27 NOTE — Telephone Encounter (Signed)
eScribe request for refill on ALPRAZOLAM Last filled - 05/23/12, #90 x 2 Last seen on - 06/06/12 Follow up - 4-6 MONTHS Please advise refills.

## 2012-09-19 ENCOUNTER — Telehealth: Payer: Self-pay | Admitting: *Deleted

## 2012-09-19 NOTE — Telephone Encounter (Signed)
PC from mother requesting note from our office stating that we advised him to be out of work times one week.  Advised that we had printed and given him a note at his 8/23 visit.  This letter reprinted and mailed to patient.

## 2012-10-06 ENCOUNTER — Ambulatory Visit: Payer: BC Managed Care – PPO | Admitting: Family Medicine

## 2012-10-08 ENCOUNTER — Telehealth: Payer: Self-pay | Admitting: Family Medicine

## 2012-10-08 NOTE — Telephone Encounter (Signed)
Patient needs to know the last date he received a flu shot.

## 2012-10-08 NOTE — Telephone Encounter (Signed)
Pt has not had vaccine this year.  Last flu shot 09/05/11 in [facility] records.

## 2012-11-22 ENCOUNTER — Other Ambulatory Visit: Payer: Self-pay | Admitting: Family Medicine

## 2012-11-24 ENCOUNTER — Other Ambulatory Visit: Payer: Self-pay | Admitting: *Deleted

## 2012-11-24 MED ORDER — ALPRAZOLAM 1 MG PO TABS: ORAL_TABLET | ORAL | Status: DC

## 2012-11-24 NOTE — Telephone Encounter (Signed)
RX faxed

## 2012-11-24 NOTE — Telephone Encounter (Signed)
Faxed/Phoned refill request received from pharmacy for ALPRAZOLAM Last filled by MD on 08/27/12, #90 X 2 Last seen on 06/06/12 Follow up 4-6 MONTHS Please advise refills.

## 2012-11-24 NOTE — Telephone Encounter (Signed)
eScribe request for refill on LISIN-HCTZ Last filled - 05/21/12, #30 X 6 Last seen on - 06/06/12 Follow up - 4-6 MONTHS, due for follow up in March RX sent

## 2013-02-17 ENCOUNTER — Telehealth: Payer: Self-pay | Admitting: *Deleted

## 2013-02-17 MED ORDER — ALPRAZOLAM 1 MG PO TABS: ORAL_TABLET | ORAL | Status: DC

## 2013-02-17 NOTE — Telephone Encounter (Signed)
Rx ready for p/u; LMOM with contact name and number for return call RE: medication refill request and further provider instructions/SLS

## 2013-02-17 NOTE — Telephone Encounter (Signed)
Patient wants to pick up Rx 02/18/13 if possible

## 2013-02-17 NOTE — Telephone Encounter (Signed)
Rx for 30 d supply with no additional RFs printed today. Pt must f/u in office prior to any FURTHER RF's.

## 2013-02-17 NOTE — Telephone Encounter (Signed)
Faxed refill request received from pharmacy for ALPRAZOLAM  Last filled by MD on 02.24.14, #90 X 2  Last seen on 09.06.13 [No show lab appt 10.14.13; Canceled F/U visit 01.06.14] Follow up 4-6 MONTHS [No future appointments] Please advise refills/SLS

## 2013-02-18 ENCOUNTER — Telehealth: Payer: Self-pay | Admitting: Family Medicine

## 2013-02-18 NOTE — Telephone Encounter (Signed)
PER COMMENT NOTE IN VISIT INFORMATION: Jacob Frank on 02/18/2013 02:03 PM Rx Call In Patient states he would like his prescription called in to [facility] pharmacy instead of picking it up from the office and that it has been done in the past before. He also wanted Dr. Milinda Cave to know he goes before the board about his disability on June 2nd and he will schedule an appointment to see him asap.  Rx request faxed to pharmacy/SLS

## 2013-02-18 NOTE — Telephone Encounter (Signed)
PLEASE SEE Open Note from 05.20.14/SLS

## 2013-03-04 ENCOUNTER — Ambulatory Visit (INDEPENDENT_AMBULATORY_CARE_PROVIDER_SITE_OTHER): Payer: Self-pay | Admitting: Family Medicine

## 2013-03-04 ENCOUNTER — Encounter: Payer: Self-pay | Admitting: Family Medicine

## 2013-03-04 VITALS — BP 122/89 | HR 86 | Temp 97.7°F | Resp 18 | Wt 195.8 lb

## 2013-03-04 DIAGNOSIS — I10 Essential (primary) hypertension: Secondary | ICD-10-CM

## 2013-03-04 DIAGNOSIS — E538 Deficiency of other specified B group vitamins: Secondary | ICD-10-CM

## 2013-03-04 DIAGNOSIS — F411 Generalized anxiety disorder: Secondary | ICD-10-CM

## 2013-03-04 MED ORDER — FLUOXETINE HCL 20 MG PO CAPS: 20.0000 mg | ORAL_CAPSULE | Freq: Every day | ORAL | Status: DC

## 2013-03-04 MED ORDER — LISINOPRIL-HYDROCHLOROTHIAZIDE 20-25 MG PO TABS: ORAL_TABLET | ORAL | Status: DC

## 2013-03-04 MED ORDER — CYANOCOBALAMIN 1000 MCG/ML IJ SOLN
1000.0000 ug | Freq: Once | INTRAMUSCULAR | Status: AC
  Administered 2013-03-04: 1000 ug via INTRAMUSCULAR
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
## 2013-03-06 ENCOUNTER — Encounter: Payer: Self-pay | Admitting: Family Medicine

## 2013-03-19 ENCOUNTER — Telehealth: Payer: Self-pay | Admitting: Family Medicine

## 2013-03-19 MED ORDER — ALPRAZOLAM 1 MG PO TABS: ORAL_TABLET | ORAL | Status: DC

## 2013-03-19 NOTE — Telephone Encounter (Signed)
Request for ALPRAZOLAM Last Rx: 05.20.14, #90x0 Last OV: 06.04.14 Return: 6-Months Please advise on refills/SLS

## 2013-03-19 NOTE — Telephone Encounter (Signed)
Rx request faxed to pharmacy/SLS  

## 2013-03-19 NOTE — Telephone Encounter (Signed)
I did 1 mo supply with 1 additional RF.

## 2013-03-31 DIAGNOSIS — M7021 Olecranon bursitis, right elbow: Secondary | ICD-10-CM

## 2013-03-31 HISTORY — DX: Olecranon bursitis, right elbow: M70.21

## 2013-04-13 ENCOUNTER — Ambulatory Visit (INDEPENDENT_AMBULATORY_CARE_PROVIDER_SITE_OTHER): Payer: Self-pay | Admitting: Family Medicine

## 2013-04-13 ENCOUNTER — Encounter: Payer: Self-pay | Admitting: Family Medicine

## 2013-04-13 VITALS — BP 136/89 | HR 71 | Temp 98.2°F | Resp 22 | Ht 69.0 in | Wt 189.0 lb

## 2013-04-13 DIAGNOSIS — M7020 Olecranon bursitis, unspecified elbow: Secondary | ICD-10-CM

## 2013-04-13 DIAGNOSIS — E538 Deficiency of other specified B group vitamins: Secondary | ICD-10-CM

## 2013-04-13 DIAGNOSIS — M7021 Olecranon bursitis, right elbow: Secondary | ICD-10-CM | POA: Insufficient documentation

## 2013-04-13 MED ORDER — CYANOCOBALAMIN 1000 MCG/ML IJ SOLN
1000.0000 ug | Freq: Once | INTRAMUSCULAR | Status: AC
  Administered 2013-04-13: 1000 ug via INTRAMUSCULAR

## 2013-04-13 NOTE — Addendum Note (Signed)
Addended by: Eulah Pont on: 04/13/2013 02:49 PM   Modules accepted: Orders

## 2013-04-13 NOTE — Progress Notes (Signed)
OFFICE NOTE  04/13/2013  CC:  Chief Complaint  Patient presents with  . Mass    elbow  . B12 Injection    pt wants injection     HPI: Patient is a 62 y.o. Caucasian male who is here for right elbow swelling.   Noted onset of swelling in right elbow 2 days ago, no trauma recalled.  Hurts when he has pressure on it, like when he lies in bed.   Not driving any lately.  No overuse, no excessive pressure on elbow lately.  Otherwise feels ok physically, admits he has the "blahs". He has never had this problem with his elbow before.  Pertinent PMH:  Past Medical History  Diagnosis Date  . HTN (hypertension)   . Anxiety and depression   . Tobacco dependence     80-90 pack-yr hx  . History of substance abuse     cocaine and meth + IV drug use; pt claims he's been clean since 2005  . Vitamin B12 deficiency     hx unclear but pt states he's been getting monthly vit B12 injections and they help him feel better  . Furuncles     inner thighs; required I&D in the past  . COPD (chronic obstructive pulmonary disease)     hx of treatment with prn albuterol only due to pt refusing to pay for any daily preventative meds.  Has required many courses of systemic steroids.  . Erectile dysfunction   . History of acute prostatitis   . Hearing loss     ? 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  . History of hepatitis Distant past    Hep B surface antigen and antibody NEG and Hep C antibody testing neg; transaminases ok.   Past surgical, social, and family history reviewed and no changes noted since last office visit.  MEDS:  Outpatient Prescriptions Prior to Visit  Medication Sig Dispense Refill  . ALPRAZolam (XANAX) 1 MG tablet TAKE ONE THREE TIMES DAILY AS NEEDED  90 tablet  1  . aspirin 325 MG EC tablet Take 325 mg by mouth daily.      . cyanocobalamin (,VITAMIN B-12,) 1000 MCG/ML injection Inject 1,000 mcg into the muscle every 30 (thirty) days.      [name] FLUoxetine  (PROZAC) 20 MG capsule Take 1 capsule (20 mg total) by mouth daily.  30 capsule  6  . lisinopril-hydrochlorothiazide (PRINZIDE,ZESTORETIC) 20-25 MG per tablet TAKE ONE TABLET DAILY.  30 tablet  6  . VENTOLIN HFA 108 (90 BASE) MCG/ACT inhaler 2 PUFFS EVERY FOUR HOURS AS NEEDED FOR WHEEZING  18 g  1   No facility-administered medications prior to visit.    PE: Blood pressure 136/89, pulse 71, temperature 98.2 F (36.8 C), temperature source Temporal, resp. rate 22, height 5\' 9"  (1.753 m), weight 189 lb (85.73 kg), SpO2 95.00%. Gen: Alert, well appearing.  Patient is oriented to person, place, time, and situation. Right elbow: golf-ball sized swelling over the olecranon process--fluctuant but nontender and without erythema or warmth.  Elbow with full ROM.  Upper arm and low arm nontender.  IMPRESSION AND PLAN:  Right olecranon bursitis: discussed option of conservative management with ice/compression/oral NSAIDs vs acute treatment today with aspiration and potential injection of steroid into olecranon bursa. He wants aspiration today.  Consent form reviewed, signed pt pt and myself and witnessed by CMA Faythe Ghee. Using sterile technique I aspirated approx 8-10 cc of serosanguinous fluid from the right olecranon bursa.  I decided against steroid injection today after seeing the serosanguinous fluid.  The fluid did not appear purulent. Pt tolerated procedure well, no immediate complications.  Ace bandage wrapped for pressure.  Recommended ice/pressure at the site 20 min at a time, three times per day for a few days. Call or return if fluid reaccummulates and will need to consider checking plain film of elbow. Signs/symptoms to call or return for were reviewed and pt expressed understanding.  Pt's monthly vitamin B12 injection was given while he was here today (1000 mcg IM).  FOLLOW UP: prn

## 2013-04-18 ENCOUNTER — Emergency Department (HOSPITAL_COMMUNITY)
Admission: EM | Admit: 2013-04-18 | Discharge: 2013-04-18 | Disposition: A | Discharge status: Home / Self Care | Payer: Self-pay | Attending: Emergency Medicine | Admitting: Emergency Medicine

## 2013-04-18 ENCOUNTER — Encounter (HOSPITAL_COMMUNITY): Payer: Self-pay

## 2013-04-18 DIAGNOSIS — E538 Deficiency of other specified B group vitamins: Secondary | ICD-10-CM | POA: Insufficient documentation

## 2013-04-18 DIAGNOSIS — J4489 Other specified chronic obstructive pulmonary disease: Secondary | ICD-10-CM | POA: Insufficient documentation

## 2013-04-18 DIAGNOSIS — Z8619 Personal history of other infectious and parasitic diseases: Secondary | ICD-10-CM | POA: Insufficient documentation

## 2013-04-18 DIAGNOSIS — M7020 Olecranon bursitis, unspecified elbow: Secondary | ICD-10-CM | POA: Insufficient documentation

## 2013-04-18 DIAGNOSIS — Z79899 Other long term (current) drug therapy: Secondary | ICD-10-CM | POA: Insufficient documentation

## 2013-04-18 DIAGNOSIS — M7021 Olecranon bursitis, right elbow: Secondary | ICD-10-CM

## 2013-04-18 DIAGNOSIS — Z872 Personal history of diseases of the skin and subcutaneous tissue: Secondary | ICD-10-CM | POA: Insufficient documentation

## 2013-04-18 DIAGNOSIS — F341 Dysthymic disorder: Secondary | ICD-10-CM | POA: Insufficient documentation

## 2013-04-18 DIAGNOSIS — Z87448 Personal history of other diseases of urinary system: Secondary | ICD-10-CM | POA: Insufficient documentation

## 2013-04-18 DIAGNOSIS — F17200 Nicotine dependence, unspecified, uncomplicated: Secondary | ICD-10-CM | POA: Insufficient documentation

## 2013-04-18 DIAGNOSIS — H9190 Unspecified hearing loss, unspecified ear: Secondary | ICD-10-CM | POA: Insufficient documentation

## 2013-04-18 DIAGNOSIS — J449 Chronic obstructive pulmonary disease, unspecified: Secondary | ICD-10-CM | POA: Insufficient documentation

## 2013-04-18 DIAGNOSIS — I10 Essential (primary) hypertension: Secondary | ICD-10-CM | POA: Insufficient documentation

## 2013-04-18 DIAGNOSIS — Z7982 Long term (current) use of aspirin: Secondary | ICD-10-CM | POA: Insufficient documentation

## 2013-04-18 HISTORY — DX: Olecranon bursitis, right elbow: M70.21

## 2013-04-18 MED ORDER — HYDROCODONE-ACETAMINOPHEN 5-325 MG PO TABS: 1.0000 | ORAL_TABLET | Freq: Four times a day (QID) | ORAL | Status: DC | PRN

## 2013-04-18 NOTE — ED Notes (Signed)
Complain of fluid in right elbow. Fluid was drawn off by PCP on Monday and is back.

## 2013-04-18 NOTE — ED Provider Notes (Signed)
History    This chart was scribed for Shelda Jakes, MD by Leone Payor, ED Scribe. This patient was seen in room APFT24/APFT24 and the patient's care was started 2:58 PM.   CSN: [id number] Arrival date & time 04/18/13  1343  First MD Initiated Contact with Patient 04/18/13 1457     Chief Complaint  Patient presents with  . Bursitis    The history is provided by the patient. No language interpreter was used.    HPI Comments: Jacob Frank is a 62 y.o. male who presents to the Emergency Department complaining of a new episode of bursitis to the R elbow that returned yesterday morning. The bursitis started 1 week ago and had swelling that gradually increased until he had fluid removed by his PCP 5 days ago. Rates the pain as 8/10 at max and is worse when he leans on or applies pressure to area. He denies any recent injury or trauma to the area. Pt has applied ice with minimal relief. States it is very painful and hard to move his arm.   Past Medical History  Diagnosis Date  . HTN (hypertension)   . Anxiety and depression   . Tobacco dependence     80-90 pack-yr hx  . History of substance abuse     cocaine and meth + IV drug use; pt claims he's been clean since 2005  . Vitamin B12 deficiency     hx unclear but pt states he's been getting monthly vit B12 injections and they help him feel better  . Furuncles     inner thighs; required I&D in the past  . COPD (chronic obstructive pulmonary disease)     hx of treatment with prn albuterol only due to pt refusing to pay for any daily preventative meds.  Has required many courses of systemic steroids.  . Erectile dysfunction   . History of acute prostatitis   . Hearing loss     ? 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  . History of hepatitis Distant past    Hep B surface antigen and antibody NEG and Hep C antibody testing neg; transaminases ok.   History reviewed. No pertinent past surgical history. Family  History  Problem Relation Age of Onset  . Arthritis Mother   . Cirrhosis Mother   . Cancer Father     brain tumor   History  Substance Use Topics  . Smoking status: Current Every Day Smoker -- 2.00 packs/day for 50 years    Types: Cigarettes  . Smokeless tobacco: Never Used  . Alcohol Use: Yes     Comment: 1-2 x month (6-12 pk)    Review of Systems  Constitutional: Negative for fever and chills.  HENT: Negative for sore throat and rhinorrhea.   Eyes: Negative for visual disturbance.  Respiratory: Negative for cough and shortness of breath.   Cardiovascular: Negative for chest pain and leg swelling.  Gastrointestinal: Negative for nausea, vomiting, abdominal pain and diarrhea.  Musculoskeletal: Positive for joint swelling (R elbow) and arthralgias (R elbow).  Skin: Negative for rash.  Neurological: Negative for numbness.  Hematological: Does not bruise/bleed easily.  Psychiatric/Behavioral: Negative for confusion.    Allergies  Citalopram  Home Medications   Current Outpatient Rx  Name  Route  Sig  Dispense  Refill  . ALPRAZolam (XANAX) 1 MG tablet      TAKE ONE THREE TIMES DAILY AS NEEDED   90 tablet   1   .  aspirin 325 MG EC tablet   Oral   Take 325 mg by mouth daily.         . cyanocobalamin (,VITAMIN B-12,) 1000 MCG/ML injection   Intramuscular   Inject 1,000 mcg into the muscle every 30 (thirty) days.         [name] FLUoxetine (PROZAC) 20 MG capsule   Oral   Take 1 capsule (20 mg total) by mouth daily.   30 capsule   6   . HYDROcodone-acetaminophen (NORCO/VICODIN) 5-325 MG per tablet   Oral   Take 1-2 tablets by mouth every 6 (six) hours as needed for pain.   20 tablet   0   . lisinopril-hydrochlorothiazide (PRINZIDE,ZESTORETIC) 20-25 MG per tablet      TAKE ONE TABLET DAILY.   30 tablet   6   . VENTOLIN HFA 108 (90 BASE) MCG/ACT inhaler      2 PUFFS EVERY FOUR HOURS AS NEEDED FOR WHEEZING   18 g   1    BP 125/86  Pulse 90  Temp(Src)  97.8 F (36.6 C) (Oral)  Resp 19  Ht 5\' 9"  (1.753 m)  Wt 189 lb (85.73 kg)  BMI 27.9 kg/m2  SpO2 98% Physical Exam  Nursing note and vitals reviewed. Constitutional: He is oriented to person, place, and time. He appears well-developed and well-nourished.  HENT:  Head: Normocephalic and atraumatic.  Eyes: Conjunctivae and EOM are normal. Pupils are equal, round, and reactive to light.  Neck: Normal range of motion. Neck supple.  Cardiovascular: Normal rate, regular rhythm and normal heart sounds.  Exam reveals no gallop and no friction rub.   No murmur heard. Pulses:      Radial pulses are 2+ on the right side.  Pulmonary/Chest: Effort normal and breath sounds normal. No respiratory distress. He has no wheezes. He has no rales. He exhibits no tenderness.  Abdominal: Soft. Bowel sounds are normal. He exhibits no distension and no mass. There is no tenderness. There is no rebound and no guarding.  Musculoskeletal: Normal range of motion.  Swelling over the olecranon process over R elbow. Area is soft, squishy, and non-erythematous. Good ROM at R elbow and shoulder.   Neurological: He is alert and oriented to person, place, and time.  Skin: Skin is warm and dry.  Psychiatric: He has a normal mood and affect.    ED Course  Procedures (including critical care time)  DIAGNOSTIC STUDIES: Oxygen Saturation is 98% on RA, normal by my interpretation.    COORDINATION OF CARE: 3:05 PM Discussed treatment plan with pt at bedside and pt agreed to plan.   Labs Reviewed - No data to display No results found. 1. Olecranon bursitis of right elbow     MDM  Symptoms consistent with right olecranon bursitis. Was drained by primary care Dr. on Monday no evidence of infection. Patient did not keep it wrapped properly and has reoccurred. Will refer to orthopedics and treat with pain medication. No evidence of any significant trauma by history or exam.  I personally performed the services described  in this documentation, which was scribed in my presence. The recorded information has been reviewed and is accurate.     Shelda Jakes, MD 04/18/13 [phone]

## 2013-05-11 ENCOUNTER — Ambulatory Visit (INDEPENDENT_AMBULATORY_CARE_PROVIDER_SITE_OTHER): Payer: Self-pay | Admitting: Family Medicine

## 2013-05-11 ENCOUNTER — Encounter: Payer: Self-pay | Admitting: Family Medicine

## 2013-05-11 VITALS — BP 111/81 | HR 96 | Temp 98.4°F | Resp 20 | Ht 69.0 in | Wt 188.0 lb

## 2013-05-11 DIAGNOSIS — M7021 Olecranon bursitis, right elbow: Secondary | ICD-10-CM

## 2013-05-11 DIAGNOSIS — M7020 Olecranon bursitis, unspecified elbow: Secondary | ICD-10-CM

## 2013-05-11 DIAGNOSIS — E538 Deficiency of other specified B group vitamins: Secondary | ICD-10-CM

## 2013-05-11 MED ORDER — ALPRAZOLAM 1 MG PO TABS: ORAL_TABLET | ORAL | Status: DC

## 2013-05-11 MED ORDER — CYANOCOBALAMIN 1000 MCG/ML IJ SOLN
1000.0000 ug | Freq: Once | INTRAMUSCULAR | Status: AC
  Administered 2013-05-11: 1000 ug via INTRAMUSCULAR

## 2013-05-11 MED ORDER — METHYLPREDNISOLONE ACETATE 40 MG/ML IJ SUSP
40.0000 mg | Freq: Once | INTRAMUSCULAR | Status: AC
  Administered 2013-05-11: 40 mg via INTRA_ARTICULAR

## 2013-05-11 NOTE — Progress Notes (Signed)
OFFICE NOTE  05/11/2013  CC:  Chief Complaint  Patient presents with  . Joint Swelling    elbow   . B12 Injection     HPI: Patient is a 62 y.o. Caucasian male who is here for right elbow swelling.  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  This has been drained before by myself last month, no steroid injection.  Went to ED shortly after and was told to go to orthopedist but he didn't do this b/c he can't afford it.  Swelling has been up again since shortly after I last drained it. No fever, no malaise.  No other joint complaints.  Pertinent PMH:  Past Medical History  Diagnosis Date  . HTN (hypertension)   . Anxiety and depression   . Tobacco dependence     80-90 pack-yr hx  . History of substance abuse     cocaine and meth + IV drug use; pt claims he's been clean since 2005  . Vitamin B12 deficiency     hx unclear but pt states he's been getting monthly vit B12 injections and they help him feel better  . Furuncles     inner thighs; required I&D in the past  . COPD (chronic obstructive pulmonary disease)     hx of treatment with prn albuterol only due to pt refusing to pay for any daily preventative meds.  Has required many courses of systemic steroids.  . Erectile dysfunction   . History of acute prostatitis   . Hearing loss     ? 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  . History of hepatitis Distant past    Hep B surface antigen and antibody NEG and Hep C antibody testing neg; transaminases ok.  . Olecranon bursitis of right elbow 03/2013    Aspirated in office    MEDS:  Outpatient Prescriptions Prior to Visit  Medication Sig Dispense Refill  . ALPRAZolam (XANAX) 1 MG tablet TAKE ONE THREE TIMES DAILY AS NEEDED  90 tablet  1  . aspirin 325 MG EC tablet Take 325 mg by mouth daily.      . cyanocobalamin (,VITAMIN B-12,) 1000 MCG/ML injection Inject 1,000 mcg into the muscle every 30 (thirty) days.      [name] FLUoxetine (PROZAC) 20 MG capsule  Take 1 capsule (20 mg total) by mouth daily.  30 capsule  6  . lisinopril-hydrochlorothiazide (PRINZIDE,ZESTORETIC) 20-25 MG per tablet TAKE ONE TABLET DAILY.  30 tablet  6  . VENTOLIN HFA 108 (90 BASE) MCG/ACT inhaler 2 PUFFS EVERY FOUR HOURS AS NEEDED FOR WHEEZING  18 g  1  . HYDROcodone-acetaminophen (NORCO/VICODIN) 5-325 MG per tablet Take 1-2 tablets by mouth every 6 (six) hours as needed for pain.  20 tablet  0   No facility-administered medications prior to visit.    PE: Blood pressure 111/81, pulse 96, temperature 98.4 F (36.9 C), temperature source Temporal, resp. rate 20, height 5\' 9"  (1.753 m), weight 188 lb (85.276 kg), SpO2 96.00%. Gen: Alert, well appearing.  Patient is oriented to person, place, time, and situation. CV: RRR LUNGS: CTA bilat, nonlabored resps. Right elbow: prominent swelling of olecranon bursa, mild discomfort to palpation but not really tender.  Minimal violaceous hue to skin of the swollen area.  Elbow with rull ROM.  No erythema or streaking of right arm skin.  IMPRESSION AND PLAN:  Olecranon bursitis of right elbow Persistent/recurrent after aspiration 03/2013.   Needs aspiration today and will  inject 40 mg depo-medrol + 2 ml of 1% lidocaine into bursa sac.  PROCEDURE:  Aspiration of right olecranon bursa + injection of steroid into bursa sac: sterile technique used, 18 g 1 and 1/2 inch needle used to aspirate bursa and got return of approx 25 cc of serosanquinous fluid.  I then injected the depo medrol+lidocaine into the bursa sac w/out resistance. Patient tolerated procedure well, no immediate complications.  Aftercare discussed: compression wrap + ice bid x 3d.   An After Visit Summary was printed and given to the patient.  FOLLOW UP: prn

## 2013-05-11 NOTE — Assessment & Plan Note (Addendum)
Persistent/recurrent after aspiration 03/2013.   Needs aspiration today and will inject 40 mg depo-medrol + 2 ml of 1% lidocaine into bursa sac.  PROCEDURE:  Aspiration of right olecranon bursa + injection of steroid into bursa sac: sterile technique used, 18 g 1 and 1/2 inch needle used to aspirate bursa and got return of approx 25 cc of serosanquinous fluid.  I then injected the depo medrol+lidocaine into the bursa sac w/out resistance. Patient tolerated procedure well, no immediate complications.  Aftercare discussed: compression wrap + ice bid x 3d.

## 2013-07-06 ENCOUNTER — Telehealth: Payer: Self-pay | Admitting: Family Medicine

## 2013-07-06 MED ORDER — ALPRAZOLAM 1 MG PO TABS: ORAL_TABLET | ORAL | Status: DC

## 2013-07-06 NOTE — Telephone Encounter (Signed)
Rx printed

## 2013-07-06 NOTE — Telephone Encounter (Signed)
Patient requesting refill on xanax.  Patient last seen 05/11/13.  Medication printed 05/11/13 x 1 refill.  Please advise refill.

## 2013-07-07 NOTE — Telephone Encounter (Signed)
Rx faxed to pharmacy  

## 2013-09-03 ENCOUNTER — Ambulatory Visit: Payer: Self-pay | Admitting: Family Medicine

## 2013-09-07 ENCOUNTER — Ambulatory Visit: Payer: Self-pay | Admitting: Family Medicine

## 2013-09-07 ENCOUNTER — Encounter: Payer: Self-pay | Admitting: Family Medicine

## 2013-09-07 ENCOUNTER — Other Ambulatory Visit: Payer: Self-pay | Admitting: Family Medicine

## 2013-09-07 ENCOUNTER — Ambulatory Visit (INDEPENDENT_AMBULATORY_CARE_PROVIDER_SITE_OTHER): Payer: Self-pay | Admitting: Family Medicine

## 2013-09-07 VITALS — BP 106/75 | HR 68 | Temp 98.8°F | Resp 18 | Ht 69.0 in | Wt 178.0 lb

## 2013-09-07 DIAGNOSIS — E538 Deficiency of other specified B group vitamins: Secondary | ICD-10-CM

## 2013-09-07 MED ORDER — ALPRAZOLAM 1 MG PO TABS: ORAL_TABLET | ORAL | Status: DC

## 2013-09-07 MED ORDER — CYANOCOBALAMIN 1000 MCG/ML IJ SOLN
1000.0000 ug | Freq: Once | INTRAMUSCULAR | Status: AC
  Administered 2013-09-07: 1000 ug via INTRAMUSCULAR

## 2013-09-07 NOTE — Progress Notes (Addendum)
OFFICE NOTE Pre visit review using our clinic review tool, if applicable. No additional management support is needed unless otherwise documented below in the visit note.  09/07/2013  CC:  Chief Complaint  Patient presents with  . Medication Refill  . B12 Injection  . Fatigue     HPI: Patient is a 62 y.o. Caucasian male who is here for f/u COPD, anxiety, HTN. Asks for vit B12 shot today so I gave him 1000 mcg IM.  Still smoking a carton of cigs per week, using e cigs some.  Says wheezing is bad some days and other days not bad--nothing consistent, no change in sputum or SOB.  NO fevers.  Has fatigue as per his usual, asks for "steroids" to get my energy back.  When he checks bp at [facility] it is normal per pt.  Anxiety well controlled on current med regimen.  Ran out of xanax a few days ago.  Pertinent PMH:  Past Medical History  Diagnosis Date  . HTN (hypertension)   . Anxiety and depression   . Tobacco dependence     80-90 pack-yr hx  . History of substance abuse     cocaine and meth + IV drug use; pt claims he's been clean since 2005  . Vitamin B12 deficiency     hx unclear but pt states he's been getting monthly vit B12 injections and they help him feel better  . Furuncles     inner thighs; required I&D in the past  . COPD (chronic obstructive pulmonary disease)     hx of treatment with prn albuterol only due to pt refusing to pay for any daily preventative meds.  Has required many courses of systemic steroids.  . Erectile dysfunction   . History of acute prostatitis   . Hearing loss     ? 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  . History of hepatitis Distant past    Hep B surface antigen and antibody NEG and Hep C antibody testing neg; transaminases ok.  . Olecranon bursitis of right elbow 03/2013    Aspirated in office    MEDS:  Outpatient Prescriptions Prior to Visit  Medication Sig Dispense Refill  . ALPRAZolam (XANAX) 1 MG tablet TAKE  ONE THREE TIMES DAILY AS NEEDED  90 tablet  1  . aspirin 325 MG EC tablet Take 325 mg by mouth daily.      . cyanocobalamin (,VITAMIN B-12,) 1000 MCG/ML injection Inject 1,000 mcg into the muscle every 30 (thirty) days.      [name] FLUoxetine (PROZAC) 20 MG capsule Take 1 capsule (20 mg total) by mouth daily.  30 capsule  6  . Fluticasone-Salmeterol (ADVAIR DISKUS) 250-50 MCG/DOSE AEPB Inhale 1 puff into the lungs every 12 (twelve) hours.      [name] lisinopril-hydrochlorothiazide (PRINZIDE,ZESTORETIC) 20-25 MG per tablet TAKE ONE TABLET DAILY.  30 tablet  6  . VENTOLIN HFA 108 (90 BASE) MCG/ACT inhaler 2 PUFFS EVERY FOUR HOURS AS NEEDED FOR WHEEZING  18 g  1  . HYDROcodone-acetaminophen (NORCO/VICODIN) 5-325 MG per tablet Take 1-2 tablets by mouth every 6 (six) hours as needed for pain.  20 tablet  0   No facility-administered medications prior to visit.    PE: Blood pressure 106/75, pulse 68, temperature 98.8 F (37.1 C), temperature source Temporal, resp. rate 18, height 5\' 9"  (1.753 m), weight 178 lb (80.74 kg), SpO2 97.00%. Gen: Alert, well appearing.  Patient is oriented to person, place, time, and situation.  ZOX:WRUE: no injection, icteris, swelling, or exudate.  EOMI, PERRLA. Mouth: lips without lesion/swelling.  Oral mucosa pink and moist. Oropharynx without erythema, exudate, or swelling.  CV: RRR, distant S1 and S2 LUNGS: distant BS diffusely, without wheezing, aeration is good.  IMPRESSION AND PLAN:  1) Anxiety: The current medical regimen is effective;  continue present plan and medications. Controlled substance contract reviewed with patient today.  Patient signed this and it will be placed in the chart.   RF'd xanax today--printed rx handed to pt.  2) COPD: stable.  No acute exac.  Continue current med regimen.  STOP SMOKING.  3) Fatigue: no pathophysiologic causes suspected----he needs to quit smoking and begin exercise. Per his request I gave vit B12 1000 mcg IM today.  FOLLOW  UP: 45mo f/u COPD and anxiety

## 2013-09-29 ENCOUNTER — Ambulatory Visit: Payer: Self-pay

## 2013-10-31 ENCOUNTER — Other Ambulatory Visit: Payer: Self-pay | Admitting: Family Medicine

## 2013-12-01 ENCOUNTER — Other Ambulatory Visit: Payer: Self-pay | Admitting: Family Medicine

## 2013-12-01 ENCOUNTER — Encounter: Payer: Self-pay | Admitting: Family Medicine

## 2013-12-01 ENCOUNTER — Telehealth: Payer: Self-pay | Admitting: Family Medicine

## 2013-12-01 ENCOUNTER — Ambulatory Visit (INDEPENDENT_AMBULATORY_CARE_PROVIDER_SITE_OTHER): Payer: BC Managed Care – PPO | Admitting: Family Medicine

## 2013-12-01 VITALS — BP 146/95 | HR 70 | Temp 98.5°F | Resp 22 | Ht 69.0 in | Wt 177.0 lb

## 2013-12-01 DIAGNOSIS — R972 Elevated prostate specific antigen [PSA]: Secondary | ICD-10-CM

## 2013-12-01 DIAGNOSIS — Z0000 Encounter for general adult medical examination without abnormal findings: Secondary | ICD-10-CM

## 2013-12-01 DIAGNOSIS — Z0389 Encounter for observation for other suspected diseases and conditions ruled out: Secondary | ICD-10-CM

## 2013-12-01 DIAGNOSIS — E538 Deficiency of other specified B group vitamins: Secondary | ICD-10-CM

## 2013-12-01 LAB — CBC WITH DIFFERENTIAL/PLATELET
BASOS ABS: 0 10*3/uL (ref 0.0–0.1)
Basophils Relative: 0.5 % (ref 0.0–3.0)
EOS ABS: 0.1 10*3/uL (ref 0.0–0.7)
Eosinophils Relative: 0.8 % (ref 0.0–5.0)
HCT: 44.2 % (ref 39.0–52.0)
HEMOGLOBIN: 14.6 g/dL (ref 13.0–17.0)
LYMPHS ABS: 2 10*3/uL (ref 0.7–4.0)
LYMPHS PCT: 28.4 % (ref 12.0–46.0)
MCHC: 33 g/dL (ref 30.0–36.0)
MCV: 98 fl (ref 78.0–100.0)
MONO ABS: 0.5 10*3/uL (ref 0.1–1.0)
Monocytes Relative: 6.8 % (ref 3.0–12.0)
NEUTROS ABS: 4.5 10*3/uL (ref 1.4–7.7)
Neutrophils Relative %: 63.5 % (ref 43.0–77.0)
Platelets: 257 10*3/uL (ref 150.0–400.0)
RBC: 4.51 Mil/uL (ref 4.22–5.81)
RDW: 14.3 % (ref 11.5–14.6)
WBC: 7.1 10*3/uL (ref 4.5–10.5)

## 2013-12-01 LAB — LIPID PANEL
CHOL/HDL RATIO: 2
Cholesterol: 156 mg/dL (ref 0–200)
HDL: 63.3 mg/dL (ref >39.00)
LDL Cholesterol: 54 mg/dL (ref 0–99)
Triglycerides: 196 mg/dL — ABNORMAL HIGH (ref 0.0–149.0)
VLDL: 39.2 mg/dL (ref 0.0–40.0)

## 2013-12-01 LAB — TSH: TSH: 0.43 u[IU]/mL (ref 0.35–5.50)

## 2013-12-01 LAB — COMPREHENSIVE METABOLIC PANEL
ALK PHOS: 31 U/L — AB (ref 39–117)
ALT: 11 U/L (ref 0–53)
AST: 14 U/L (ref 0–37)
Albumin: 4 g/dL (ref 3.5–5.2)
BILIRUBIN TOTAL: 0.5 mg/dL (ref 0.3–1.2)
BUN: 11 mg/dL (ref 6–23)
CO2: 30 mEq/L (ref 19–32)
CREATININE: 1 mg/dL (ref 0.4–1.5)
Calcium: 9.1 mg/dL (ref 8.4–10.5)
Chloride: 100 mEq/L (ref 96–112)
GFR: 77.95 mL/min (ref >60.00)
Glucose, Bld: 68 mg/dL — ABNORMAL LOW (ref 70–99)
Potassium: 4 mEq/L (ref 3.5–5.1)
Sodium: 135 mEq/L (ref 135–145)
Total Protein: 6.4 g/dL (ref 6.0–8.3)

## 2013-12-01 LAB — PSA: PSA: 13.46 ng/mL — AB (ref 0.10–4.00)

## 2013-12-01 MED ORDER — CYANOCOBALAMIN 1000 MCG/ML IJ SOLN
1000.0000 ug | Freq: Once | INTRAMUSCULAR | Status: AC
  Administered 2013-12-01: 1000 ug via INTRAMUSCULAR

## 2013-12-01 MED ORDER — FLUTICASONE-SALMETEROL 250-50 MCG/DOSE IN AEPB: 1.0000 | INHALATION_SPRAY | Freq: Two times a day (BID) | RESPIRATORY_TRACT | Status: DC

## 2013-12-01 NOTE — Progress Notes (Signed)
Office Note 12/02/2013  CC:  Chief Complaint  Patient presents with  . Annual Exam    HPI:  Jacob Frank is a 63 y.o. White male who is here today for CPE.   Working a lot, driving truck cross country, doesn't take care of himself, smokes constantly, dips tobacco, does not exercise.  Says he aches all over, asks for pain pills, specifically "vicodin 10/325's". Asks for his injection of vit B12 today. No sx's of prostatitis currently.  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  Drinks coffee "constantly" while on the road.  His mother died since I last saw him and he is appropriately grieving about this.  Past Medical History  Diagnosis Date  . HTN (hypertension)   . Anxiety and depression   . Tobacco dependence     80-90 pack-yr hx  . History of substance abuse     cocaine and meth + IV drug use; pt claims he's been clean since 2005  . Vitamin B12 deficiency     hx unclear but pt states he's been getting monthly vit B12 injections and they help him feel better  . Furuncles     inner thighs; required I&D in the past  . COPD (chronic obstructive pulmonary disease)     hx of treatment with prn albuterol only due to pt refusing to pay for any daily preventative meds.  Has required many courses of systemic steroids.  . Erectile dysfunction   . History of acute prostatitis   . Hearing loss     ? 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  . History of hepatitis Distant past    Hep B surface antigen and antibody NEG and Hep C antibody testing neg; transaminases ok.  . Olecranon bursitis of right elbow 03/2013    Aspirated in office    No past surgical history on file.  Family History  Problem Relation Age of Onset  . Arthritis Mother   . Cirrhosis Mother   . Cancer Father     brain tumor    History   Social History  . Marital Status: Married    Spouse Name: N/A    Number of Children: N/A  .  Years of Education: N/A   Occupational History  . Not on file.   Social History Main Topics  . Smoking status: Current Every Day Smoker -- 2.00 packs/day for 50 years    Types: Cigarettes  . Smokeless tobacco: Never Used  . Alcohol Use: Yes     Comment: 1-2 x month (6-12 pk)  . Drug Use: No  . Sexual Activity: Not on file   Other Topics Concern  . Not on file   Social History Narrative   Divorced, remarried, has two adult children, 1 grandson.   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   Tobacco: 2-3 packs per day x 35+ yrs.     Alcohol 6-12 beers q month--"only when not on the road".  +Hx of ETOH abuse.   Admits to being a cocaine and meth addict in the past, says he's been clean since 2005.   He is unable to exercise due to COPD/breathing limitations.    Outpatient Prescriptions Prior to Visit  Medication Sig Dispense Refill  . ALPRAZolam (XANAX) 1 MG tablet TAKE ONE THREE TIMES DAILY AS NEEDED  90 tablet  5  . aspirin 325 MG  EC tablet Take 325 mg by mouth daily.      . cyanocobalamin (,VITAMIN B-12,) 1000 MCG/ML injection Inject 1,000 mcg into the muscle every 30 (thirty) days.      [name] FLUoxetine (PROZAC) 20 MG capsule TAKE ONE CAPSULE DAILY.  30 capsule  3  . lisinopril-hydrochlorothiazide (PRINZIDE,ZESTORETIC) 20-25 MG per tablet TAKE ONE TABLET DAILY.  30 tablet  3  . Fluticasone-Salmeterol (ADVAIR DISKUS) 250-50 MCG/DOSE AEPB Inhale 1 puff into the lungs every 12 (twelve) hours.      . VENTOLIN HFA 108 (90 BASE) MCG/ACT inhaler 2 PUFFS EVERY FOUR HOURS AS NEEDED FOR WHEEZING  18 g  1   No facility-administered medications prior to visit.    Allergies  Allergen Reactions  . Citalopram Other (See Comments)    Question of whether it was making him feel like his throat was "closed up".    ROS Review of Systems  Constitutional: Negative for fever, chills, appetite change and fatigue.  HENT: Negative for congestion, dental  problem, ear pain and sore throat.   Eyes: Negative for discharge, redness and visual disturbance.  Respiratory: Positive for cough (chronic "smoker's cough"). Negative for chest tightness, shortness of breath and wheezing.   Cardiovascular: Negative for chest pain, palpitations and leg swelling.  Gastrointestinal: Negative for nausea, vomiting, abdominal pain, diarrhea and blood in stool.  Genitourinary: Negative for dysuria, urgency, frequency, hematuria, flank pain and difficulty urinating.  Musculoskeletal: Positive for arthralgias (diffuse). Negative for back pain, joint swelling, myalgias and neck stiffness.  Skin: Negative for pallor and rash.  Neurological: Negative for dizziness, speech difficulty, weakness and headaches.  Hematological: Negative for adenopathy. Does not bruise/bleed easily.  Psychiatric/Behavioral: Negative for confusion and sleep disturbance. The patient is not nervous/anxious.     PE; Blood pressure 146/95, pulse 70, temperature 98.5 F (36.9 C), temperature source Temporal, resp. rate 22, height 5\' 9"  (1.753 m), weight 177 lb (80.287 kg), SpO2 99.00%. Gen: Alert, well appearing.  Patient is oriented to person, place, time, and situation. AFFECT: pleasant, lucid thought and speech. ENT: Ears: EACs clear, normal epithelium.  TMs with good light reflex and landmarks bilaterally.  Eyes: no injection, icteris, swelling, or exudate.  EOMI, PERRLA. Nose: no drainage or turbinate edema/swelling.  No injection or focal lesion.  Mouth: lips without lesion/swelling.  Oral mucosa pink and moist.  Edentulous, no gingival swelling.  Oropharynx without erythema, exudate, or swelling.  Neck: supple/nontender.  No LAD, mass, or TM.  Carotid pulses 2+ bilaterally, without bruits. CV: RRR, no m/r/g.   LUNGS: CTA bilat, nonlabored resps, good aeration in all lung fields. ABD: soft, NT, ND, BS normal.  No hepatospenomegaly or mass.  No bruits. EXT: no clubbing, cyanosis, or edema.   Musculoskeletal: no joint swelling, erythema, warmth, or tenderness.  ROM of all joints intact. Skin - no sores or suspicious lesions or rashes or color changes Rectal exam: negative without mass, lesions or tenderness, PROSTATE EXAM: smooth, firm, mildly enlarged, diffusely tender but mildly so.  Pertinent labs:  None today  ASSESSMENT AND PLAN:   Health maintenance examination Reviewed age and gender appropriate health maintenance issues (prudent diet, regular exercise, health risks of tobacco and excessive alcohol, use of seatbelts, fire alarms in home, use of sunscreen).  Also reviewed age and gender appropriate health screening as well as vaccine recommendations. HP and PSA drawn today.  Elevated PSA This has been elevated during episode of acute prostatitis AND when w/out prostatitis. Needs f/u PSA today and if still up  similarly as in the past will advise pt again that he needs to see a urologist for further evaluation and management.   An After Visit Summary was printed and given to the patient.  FOLLOW UP:  6 mo  ADDENDUM 12/02/13: PSA up higher than last summer when he had acute prostatitis.  Pt will be contacted and Urologist referral will be strongly recommended.--PM  Lab Results  Component Value Date   PSA 13.46* 12/01/2013   PSA 7.71* 05/23/2012   PSA 7.22* 05/12/2012

## 2013-12-01 NOTE — Progress Notes (Signed)
Pre visit review using our clinic review tool, if applicable. No additional management support is needed unless otherwise documented below in the visit note. 

## 2013-12-01 NOTE — Assessment & Plan Note (Signed)
Reviewed age and gender appropriate health maintenance issues (prudent diet, regular exercise, health risks of tobacco and excessive alcohol, use of seatbelts, fire alarms in home, use of sunscreen).  Also reviewed age and gender appropriate health screening as well as vaccine recommendations. HP and PSA drawn today.

## 2013-12-01 NOTE — Telephone Encounter (Signed)
Relevant patient education mailed to patient.  

## 2013-12-02 ENCOUNTER — Encounter: Payer: Self-pay | Admitting: Family Medicine

## 2013-12-02 ENCOUNTER — Telehealth: Payer: Self-pay | Admitting: Family Medicine

## 2013-12-02 DIAGNOSIS — R972 Elevated prostate specific antigen [PSA]: Secondary | ICD-10-CM | POA: Insufficient documentation

## 2013-12-02 MED ORDER — ALBUTEROL SULFATE HFA 108 (90 BASE) MCG/ACT IN AERS: 2.0000 | INHALATION_SPRAY | RESPIRATORY_TRACT | Status: DC | PRN

## 2013-12-02 NOTE — Assessment & Plan Note (Signed)
This has been elevated during episode of acute prostatitis AND when w/out prostatitis. Needs f/u PSA today and if still up similarly as in the past will advise pt again that he needs to see a urologist for further evaluation and management.

## 2013-12-02 NOTE — Telephone Encounter (Signed)
Is there something else? I think ProAir is just as expensive. Please advise.

## 2013-12-02 NOTE — Telephone Encounter (Signed)
Called pt, LMOM to CB. 

## 2013-12-02 NOTE — Telephone Encounter (Signed)
Pls notify pt I sent in new rx for ProAir inhaler that should be on his insurance's formulary so it should be cheaper.-thx

## 2013-12-02 NOTE — Telephone Encounter (Signed)
Patient states inhaler was $150. Is there something else that he can take that will be cheaper?

## 2013-12-03 NOTE — Telephone Encounter (Signed)
Spoke with pt, advised inhaler is at his pharmacy. Pt understood.

## 2013-12-03 NOTE — Addendum Note (Signed)
Addended by: [name] on: 12/03/2013 11:57 AM   Modules accepted: Orders

## 2013-12-03 NOTE — Progress Notes (Signed)
Referral to [facility] ordered.

## 2013-12-10 ENCOUNTER — Encounter: Payer: Self-pay | Admitting: Family Medicine

## 2014-01-06 ENCOUNTER — Encounter (HOSPITAL_COMMUNITY): Payer: Self-pay | Admitting: Pharmacy Technician

## 2014-01-08 ENCOUNTER — Other Ambulatory Visit: Payer: Self-pay

## 2014-01-08 ENCOUNTER — Encounter (HOSPITAL_COMMUNITY)
Admission: RE | Admit: 2014-01-08 | Discharge: 2014-01-08 | Disposition: A | Discharge status: Home / Self Care | Payer: BC Managed Care – PPO | Source: Ambulatory Visit | Attending: Urology | Admitting: Urology

## 2014-01-08 ENCOUNTER — Encounter (HOSPITAL_COMMUNITY): Payer: Self-pay

## 2014-01-08 DIAGNOSIS — Z01810 Encounter for preprocedural cardiovascular examination: Secondary | ICD-10-CM | POA: Insufficient documentation

## 2014-01-08 DIAGNOSIS — Z01812 Encounter for preprocedural laboratory examination: Secondary | ICD-10-CM | POA: Insufficient documentation

## 2014-01-08 HISTORY — DX: Unspecified osteoarthritis, unspecified site: M19.90

## 2014-01-08 LAB — CBC
HCT: 40.8 % (ref 39.0–52.0)
Hemoglobin: 13.9 g/dL (ref 13.0–17.0)
MCH: 32.5 pg (ref 26.0–34.0)
MCHC: 34.1 g/dL (ref 30.0–36.0)
MCV: 95.3 fL (ref 78.0–100.0)
PLATELETS: 195 10*3/uL (ref 150–400)
RBC: 4.28 MIL/uL (ref 4.22–5.81)
RDW: 13.1 % (ref 11.5–15.5)
WBC: 5.9 10*3/uL (ref 4.0–10.5)

## 2014-01-08 LAB — BASIC METABOLIC PANEL
BUN: 16 mg/dL (ref 6–23)
CALCIUM: 9.5 mg/dL (ref 8.4–10.5)
CO2: 31 meq/L (ref 19–32)
Chloride: 100 mEq/L (ref 96–112)
Creatinine, Ser: 0.98 mg/dL (ref 0.50–1.35)
GFR calc Af Amer: >90 mL/min (ref >90)
GFR calc non Af Amer: 87 mL/min — ABNORMAL LOW (ref >90)
Glucose, Bld: 88 mg/dL (ref 70–99)
POTASSIUM: 5.1 meq/L (ref 3.7–5.3)
SODIUM: 141 meq/L (ref 137–147)

## 2014-01-08 NOTE — Patient Instructions (Signed)
Jacob Frank  01/08/2014   Your procedure is scheduled on:   01/14/2014  Report to [facility] at  845  AM.  Call this number if you have problems the morning of surgery: [phone]   Remember:   Do not eat food or drink liquids after midnight.   Take these medicines the morning of surgery with A SIP OF WATER:  Xanax, prozac, lisinopril, oxycodone. Take your advair and albuterol before you come.   Do not wear jewelry, make-up or nail polish.  Do not wear lotions, powders, or perfumes.   Do not shave 48 hours prior to surgery. Men may shave face and neck.  Do not bring valuables to the hospital.  [facility] is not responsible for any belongings or valuables.               Contacts, dentures or bridgework may not be worn into surgery.  Leave suitcase in the car. After surgery it may be brought to your room.  For patients admitted to the hospital, discharge time is determined by your treatment team.               Patients discharged the day of surgery will not be allowed to drive home.  Name and phone number of your driver: family  Special Instructions: Shower using CHG 2 nights before surgery and the night before surgery.  If you shower the day of surgery use CHG.  Use special wash - you have one bottle of CHG for all showers.  You should use approximately 1/3 of the bottle for each shower.   Please read over the following fact sheets that you were given: Pain Booklet, Coughing and Deep Breathing, Surgical Site Infection Prevention, Anesthesia Post-op Instructions and Care and Recovery After Surgery Bone Biopsy, Open An open bone biopsy is a surgical procedure to remove a small piece of bone. This piece of bone is examined under a microscope by a specialist Armed forces logistics/support/administrative officer). It is usually taken in the area where X-rays and or MRI have identified a concern. The biopsy may be done to make sure something seen in the bone is not cancer or another problem that needs treatment. It can identify  problems such as:  A tumor of the bone marrow (multiple myeloma).  Bone that forms abnormally (Paget's disease).  Benign bone cysts.  Bony growths.  An infection in the bone (osteomyelitis). LET YOUR CAREGIVER KNOW ABOUT:   Previous problems with anesthetics or medicines used to numb the skin.  Allergies to dyes, iodine, foods, or latex.  Medicines taken including herbs, eye drops, prescription medicines (especially medicines used to "thin the blood"), aspirin and other over-the-counter medicines, and steroids (by mouth or as a cream).  History of bleeding or blood problems.  Possibility of pregnancy, if this applies.  History of blood clots in your legs or lungs .  Previous surgery.  Other important health problems. RISKS AND COMPLICATIONS All surgery is associated with risks. Some of these risks are:   Excessive bleeding.  Infection.  Injury to surrounding tissue. BEFORE THE PROCEDURE  Ask your caregiver how long to withhold aspirin or blood thinners prior to the procedure, as these can cause excessive bleeding after surgery.  Do not eat or drink anything after midnight the night before the biopsy  Let your caregiver know if you develop a cold or other infectious problem prior to surgery.  You should be present 60 minutes prior to your procedure or as directed.  PROCEDURE   A general anesthetic is usually given. This means you will be asleep during the procedure. Sometimes a regional anesthesia is used. This means just the location of the surgical site will be numbed for the procedure.  After the sample is removed through a cut made by the surgeon, the cut is sewn up with stitches.  This is usually a same day surgery, although your caregiver may want you to stay overnight for observation. If you are allowed to go home, you can usually leave within a couple of hours after surgery as long as there are no complications. HOME CARE INSTRUCTIONS   Keep your surgery  site clean and dry.  You may shower and bathe normally unless instructed otherwise by your surgeon.  Pat the wound dry and put on a new dressing (medication and bandage) after cleansing.  Protect the wound until your caregiver advises you can return to regular daily activities. Finding out the results of your test Not all test results are available during your visit. If your test results are not back during the visit, make an appointment with your caregiver to find out the results. Do not assume everything is normal if you have not heard from your caregiver or the medical facility. It is important for you to follow up on all of your test results.  SEEK MEDICAL CARE IF:   You have redness, swelling, or increasing pain in the surgical site.  You have pus coming from the surgical site.  You have drainage from the biopsy site lasting longer than 1 day.  You notice a bad smell coming from the biopsy site or dressing.  You have a breaking open of the site (edges not staying together) after sutures have been removed.  You develop persistent nausea or vomiting. SEEK IMMEDIATE MEDICAL CARE IF:   You have a fever.  You develop a rash.  You have difficulty breathing.  You develop any reaction or side effects to medicines given. Document Released: 07/27/2004 Document Revised: 12/10/2011 Document Reviewed: 02/23/2009 [facility] Patient Information 2014 [place], [place]. PATIENT INSTRUCTIONS POST-ANESTHESIA  IMMEDIATELY FOLLOWING SURGERY:  Do not drive or operate machinery for the first twenty four hours after surgery.  Do not make any important decisions for twenty four hours after surgery or while taking narcotic pain medications or sedatives.  If you develop intractable nausea and vomiting or a severe headache please notify your doctor immediately.  FOLLOW-UP:  Please make an appointment with your surgeon as instructed. You do not need to follow up with anesthesia unless specifically  instructed to do so.  WOUND CARE INSTRUCTIONS (if applicable):  Keep a dry clean dressing on the anesthesia/puncture wound site if there is drainage.  Once the wound has quit draining you may leave it open to air.  Generally you should leave the bandage intact for twenty four hours unless there is drainage.  If the epidural site drains for more than 36-48 hours please call the anesthesia department.  QUESTIONS?:  Please feel free to call your physician or the hospital operator if you have any questions, and they will be happy to assist you.

## 2014-01-08 NOTE — Pre-Procedure Instructions (Signed)
Patient given information to sign up for my chart at home. 

## 2014-01-12 NOTE — H&P (Addendum)
Urology History and Physical Exam  CC: has hard area in prostate and elevated psa to 13.46 prostate biopsy is negative. I have told him that i have to do biopsy under anesthesia in day surgery. I have discussed with him possible complications of the procedure which include infection. I will give him prophylactic antibiotic during the procedure and post operatively.  HPI: 63 y.o. year old male   PMH: Past Medical History  Diagnosis Date  . HTN (hypertension)   . Anxiety and depression   . Tobacco dependence     80-90 pack-yr hx  . History of substance abuse     cocaine and meth + IV drug use; pt claims he's been clean since 2005  . Vitamin B12 deficiency     hx unclear but pt states he's been getting monthly vit B12 injections and they help him feel better  . Furuncles     inner thighs; required I&D in the past  . COPD (chronic obstructive pulmonary disease)     hx of treatment with prn albuterol only due to pt refusing to pay for any daily preventative meds.  Has required many courses of systemic steroids.  . Erectile dysfunction   . History of acute prostatitis   . Hearing loss     ? 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  . History of hepatitis Distant past    Hep B surface antigen and antibody NEG and Hep C antibody testing neg; transaminases ok.  . Olecranon bursitis of right elbow 03/2013    Aspirated in office  . Mini stroke     right side of mouth droops  . Anxiety   . Arthritis     PSH: No past surgical history on file.  Allergies: Allergies  Allergen Reactions  . Citalopram Other (See Comments)    Question of whether it was making him feel like his throat was "closed up".    Medications: No prescriptions prior to admission     Social History: History   Social History  . Marital Status: Married    Spouse Name: N/A    Number of Children: N/A  . Years of Education: N/A   Occupational History  . Not on file.   Social History Main  Topics  . Smoking status: Former Smoker -- 2.00 packs/day for 50 years    Types: Cigarettes    Quit date: 01/06/2014  . Smokeless tobacco: Never Used  . Alcohol Use: Yes     Comment: 1-2 x month (6-12 pk)  . Drug Use: No  . Sexual Activity: Not on file   Other Topics Concern  . Not on file   Social History Narrative   Divorced, remarried, has two adult children, 1 grandson.   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   Tobacco: 2-3 packs per day x 35+ yrs.     Alcohol 6-12 beers q month--"only when not on the road".  +Hx of ETOH abuse.   Admits to being a cocaine and meth addict in the past, says he's been clean since 2005.   He is unable to exercise due to COPD/breathing limitations.    Family History: Family History  Problem Relation Age of Onset  . Arthritis Mother   . Cirrhosis Mother   . Cancer Father     brain tumor    Review of Systems: Positive:  Negative:   A further 10 point review of systems was negative except what  is listed in                 the HPI.  Physical Exam: @VITALS2 @ General: No acute distress.  Awake. Head:  Normocephalic.  Atraumatic. ENT:  EOMI.  Mucous membranes moist Neck:  Supple.  No lymphadenopathy. CV:  S1 present. S2 present. Regular rate. Pulmonary: Equal effort bilaterally.  Clear to auscultation bilaterally. Abdomen: Soft non tender Skin:  Normal turgor.  No visible rash. Extremity: No gross deformity of bilateral upper extremities.  No gross deformity of                             lower extremities. Neurologic: Alert. Appropriate mood.  Penis:  circumcised.  No lesions. Urethra: Orthotopic meatus. Scrotum: No lesions.  No ecchymosis.  No erythema. Testicles: Descended bilaterally.  No masses bilaterally. Epididymis: Palpable bilaterally. Nontender to palpation.  Studies:  No results found for this basename: HGB, WBC, PLT,  in the last 72 hours  No results found for this basename: NA,  K, CL, CO2, BUN, CREATININE, CALCIUM, MAGNESIUM, GFRNONAA, GFRAA,  in the last 72 hours   No results found for this basename: PT, INR, APTT,  in the last 72 hours   No components found with this basename: ABG,     Assessment:  Elevated psa and hard prostate on r side near apex. Plan trans rectal prostate biopsy under anesthesia  Marissa Nestle  01/12/2014 12:50 PM

## 2014-01-13 NOTE — OR Nursing (Signed)
Last dose of asa 325mg  per patient was 1 month ago.

## 2014-01-14 ENCOUNTER — Encounter (HOSPITAL_COMMUNITY): Payer: Self-pay | Admitting: *Deleted

## 2014-01-14 ENCOUNTER — Encounter (HOSPITAL_COMMUNITY): Payer: BC Managed Care – PPO | Admitting: Anesthesiology

## 2014-01-14 ENCOUNTER — Ambulatory Visit (HOSPITAL_COMMUNITY)
Admission: RE | Admit: 2014-01-14 | Discharge: 2014-01-14 | Disposition: A | Discharge status: Home / Self Care | Payer: BC Managed Care – PPO | Source: Ambulatory Visit | Attending: Urology | Admitting: Urology

## 2014-01-14 ENCOUNTER — Ambulatory Visit (HOSPITAL_COMMUNITY): Payer: BC Managed Care – PPO | Admitting: Anesthesiology

## 2014-01-14 ENCOUNTER — Encounter (HOSPITAL_COMMUNITY)
Admission: RE | Disposition: A | Discharge status: Home / Self Care | Payer: Self-pay | Source: Ambulatory Visit | Attending: Urology

## 2014-01-14 DIAGNOSIS — J4489 Other specified chronic obstructive pulmonary disease: Secondary | ICD-10-CM | POA: Insufficient documentation

## 2014-01-14 DIAGNOSIS — I10 Essential (primary) hypertension: Secondary | ICD-10-CM | POA: Insufficient documentation

## 2014-01-14 DIAGNOSIS — J449 Chronic obstructive pulmonary disease, unspecified: Secondary | ICD-10-CM | POA: Insufficient documentation

## 2014-01-14 DIAGNOSIS — R972 Elevated prostate specific antigen [PSA]: Secondary | ICD-10-CM | POA: Insufficient documentation

## 2014-01-14 DIAGNOSIS — Z79899 Other long term (current) drug therapy: Secondary | ICD-10-CM | POA: Insufficient documentation

## 2014-01-14 DIAGNOSIS — Z87891 Personal history of nicotine dependence: Secondary | ICD-10-CM | POA: Insufficient documentation

## 2014-01-14 DIAGNOSIS — F411 Generalized anxiety disorder: Secondary | ICD-10-CM | POA: Insufficient documentation

## 2014-01-14 HISTORY — PX: PROSTATE BIOPSY: SHX241

## 2014-01-14 SURGERY — BIOPSY, PROSTATE
Anesthesia: Monitor Anesthesia Care | Site: Prostate

## 2014-01-14 MED ORDER — MIDAZOLAM HCL 2 MG/2ML IJ SOLN
INTRAMUSCULAR | Status: AC
  Filled 2014-01-14: qty 2

## 2014-01-14 MED ORDER — STERILE WATER FOR IRRIGATION IR SOLN
Status: DC | PRN
  Administered 2014-01-14: 200 mL

## 2014-01-14 MED ORDER — FENTANYL CITRATE 0.05 MG/ML IJ SOLN: 25.0000 ug | INTRAMUSCULAR | Status: DC | PRN

## 2014-01-14 MED ORDER — LIDOCAINE HCL (CARDIAC) 10 MG/ML IV SOLN
INTRAVENOUS | Status: DC | PRN
  Administered 2014-01-14: 10 mg via INTRAVENOUS

## 2014-01-14 MED ORDER — FENTANYL CITRATE 0.05 MG/ML IJ SOLN
INTRAMUSCULAR | Status: DC | PRN
  Administered 2014-01-14 (×2): 25 ug via INTRAVENOUS

## 2014-01-14 MED ORDER — ONDANSETRON HCL 4 MG/2ML IJ SOLN
4.0000 mg | Freq: Once | INTRAMUSCULAR | Status: AC
  Administered 2014-01-14: 4 mg via INTRAVENOUS

## 2014-01-14 MED ORDER — MIDAZOLAM HCL 2 MG/2ML IJ SOLN
1.0000 mg | INTRAMUSCULAR | Status: AC | PRN
  Administered 2014-01-14 (×3): 2 mg via INTRAVENOUS
  Filled 2014-01-14: qty 2

## 2014-01-14 MED ORDER — CIPROFLOXACIN IN D5W 400 MG/200ML IV SOLN
400.0000 mg | Freq: Once | INTRAVENOUS | Status: AC
  Administered 2014-01-14: 400 mg via INTRAVENOUS
  Filled 2014-01-14: qty 200

## 2014-01-14 MED ORDER — LACTATED RINGERS IV SOLN
INTRAVENOUS | Status: DC
  Administered 2014-01-14: 10:00:00 via INTRAVENOUS

## 2014-01-14 MED ORDER — PROPOFOL INFUSION 10 MG/ML OPTIME
INTRAVENOUS | Status: DC | PRN
  Administered 2014-01-14: 150 ug/kg/min via INTRAVENOUS

## 2014-01-14 MED ORDER — FENTANYL CITRATE 0.05 MG/ML IJ SOLN
25.0000 ug | INTRAMUSCULAR | Status: AC
  Administered 2014-01-14 (×2): 25 ug via INTRAVENOUS

## 2014-01-14 MED ORDER — FENTANYL CITRATE 0.05 MG/ML IJ SOLN
INTRAMUSCULAR | Status: AC
  Filled 2014-01-14: qty 2

## 2014-01-14 MED ORDER — ONDANSETRON HCL 4 MG/2ML IJ SOLN: 4.0000 mg | Freq: Once | INTRAMUSCULAR | Status: DC | PRN

## 2014-01-14 MED ORDER — ONDANSETRON HCL 4 MG/2ML IJ SOLN
INTRAMUSCULAR | Status: AC
  Filled 2014-01-14: qty 2

## 2014-01-14 MED ORDER — PROPOFOL 10 MG/ML IV BOLUS
INTRAVENOUS | Status: DC | PRN
  Administered 2014-01-14 (×3): 8 mg via INTRAVENOUS

## 2014-01-14 SURGICAL SUPPLY — 25 items
CLOTH BEACON ORANGE TIMEOUT ST (SAFETY) ×3 IMPLANT
COVER LIGHT HANDLE STERIS (MISCELLANEOUS) ×6 IMPLANT
COVER TABLE BACK 60X90 (DRAPES) ×3 IMPLANT
DRAPE PROXIMA HALF (DRAPES) ×3 IMPLANT
GLOVE BIO SURGEON STRL SZ7 (GLOVE) ×6 IMPLANT
GLOVE ECLIPSE 8.5 STRL (GLOVE) ×2 IMPLANT
GLOVE INDICATOR 7.0 STRL GRN (GLOVE) ×2 IMPLANT
GLOVE INDICATOR 8.5 STRL (GLOVE) ×2 IMPLANT
GLOVE SS BIOGEL STRL SZ 6.5 (GLOVE) IMPLANT
GLOVE SUPERSENSE BIOGEL SZ 6.5 (GLOVE) ×2
GOWN STRL REUS W/TWL LRG LVL3 (GOWN DISPOSABLE) ×3 IMPLANT
KIT ROOM TURNOVER AP CYSTO (KITS) ×3 IMPLANT
LUBRICANT JELLY 4.5OZ STERILE (MISCELLANEOUS) ×3 IMPLANT
MANIFOLD NEPTUNE II (INSTRUMENTS) ×3 IMPLANT
MARKER SKIN DUAL TIP RULER LAB (MISCELLANEOUS) ×3 IMPLANT
NDL BIOPSY 18X20 MAGNUM (NEEDLE) ×1 IMPLANT
NEEDLE BIOPSY 18X20 MAGNUM (NEEDLE) ×3 IMPLANT
NS IRRIG 1000ML POUR BTL (IV SOLUTION) ×3 IMPLANT
PAD ARMBOARD 7.5X6 YLW CONV (MISCELLANEOUS) ×3 IMPLANT
PAD TELFA 3X4 1S STER (GAUZE/BANDAGES/DRESSINGS) ×3 IMPLANT
SPONGE GAUZE 4X4 12PLY (GAUZE/BANDAGES/DRESSINGS) ×3 IMPLANT
SYRINGE IRR TOOMEY STRL 70CC (SYRINGE) IMPLANT
TOWEL OR 17X26 4PK STRL BLUE (TOWEL DISPOSABLE) ×3 IMPLANT
TRAY FOLEY CATH 16FR SILVER (SET/KITS/TRAYS/PACK) IMPLANT
WATER STERILE IRR 1000ML POUR (IV SOLUTION) ×3 IMPLANT

## 2014-01-14 NOTE — Anesthesia Preprocedure Evaluation (Signed)
Anesthesia Evaluation  Patient identified by MRN, date of birth, ID band Patient awake    Reviewed: Allergy & Precautions, H&P , NPO status , Patient's Chart, lab work & pertinent test results  Airway Mallampati: II TM Distance: >3 FB     Dental  (+) Edentulous Upper, Edentulous Lower   Pulmonary COPD COPD inhaler, former smoker,  breath sounds clear to auscultation        Cardiovascular hypertension, Pt. on medications Rhythm:Regular Rate:Normal     Neuro/Psych PSYCHIATRIC DISORDERS Anxiety Depression    GI/Hepatic (+)     substance abuse  cocaine use and IV drug use, Hepatitis -, B  Endo/Other    Renal/GU      Musculoskeletal   Abdominal   Peds  Hematology   Anesthesia Other Findings   Reproductive/Obstetrics                           Anesthesia Physical Anesthesia Plan  ASA: III  Anesthesia Plan: MAC   Post-op Pain Management:    Induction: Intravenous  Airway Management Planned: Simple Face Mask  Additional Equipment:   Intra-op Plan:   Post-operative Plan:   Informed Consent: I have reviewed the patients History and Physical, chart, labs and discussed the procedure including the risks, benefits and alternatives for the proposed anesthesia with the patient or authorized representative who has indicated his/her understanding and acceptance.     Plan Discussed with:   Anesthesia Plan Comments:         Anesthesia Quick Evaluation

## 2014-01-14 NOTE — Anesthesia Postprocedure Evaluation (Signed)
Anesthesia Post Note  Patient: Jacob Frank  Procedure(s) Performed: Procedure(s) (LRB): PROSTATE BIOPSY (N/A)  Anesthesia type: MAC  Patient location: PACU  Post pain: Pain level controlled  Post assessment: Post-op Vital signs reviewed, Patient's Cardiovascular Status Stable, Respiratory Function Stable, Patent Airway, No signs of Nausea or vomiting and Pain level controlled  Last Vitals:  Filed Vitals:   01/14/14 1110  BP: 98/49  Pulse: 62  Temp: 36.5 C  Resp: 15    Post vital signs: Reviewed and stable  Level of consciousness: awake and alert   Complications: No apparent anesthesia complications

## 2014-01-14 NOTE — Anesthesia Procedure Notes (Signed)
Procedure Name: MAC Date/Time: 01/14/2014 10:41 AM Performed by: Vista Deck Pre-anesthesia Checklist: Patient identified, Emergency Drugs available, Suction available, Timeout performed and Patient being monitored Patient Re-evaluated:Patient Re-evaluated prior to inductionOxygen Delivery Method: Non-rebreather mask

## 2014-01-14 NOTE — Progress Notes (Signed)
No change in H&P on reexamination. 

## 2014-01-14 NOTE — Brief Op Note (Signed)
01/14/2014  11:07 AM  PATIENT:  Sammuel Hines Caldera  63 y.o. male  PRE-OPERATIVE DIAGNOSIS:  elevated psa  POST-OPERATIVE DIAGNOSIS:  elevated psa  PROCEDURE:  Procedure(s) with comments: PROSTATE BIOPSY (N/A) - Dr. Michela Pitcher does not want ultrasound  SURGEON:  Surgeon(s) and Role:    * Marissa Nestle, MD - Primary  PHYSICIAN ASSISTANT:   ASSISTANTS: none   ANESTHESIA:   MAC  EBL:  Total I/O In: 600 [I.V.:600] Out: 0   BLOOD ADMINISTERED:none  DRAINS: none   LOCAL MEDICATIONS USED:  NONE  SPECIMEN:  Source of Specimen:  prostate r lobe near apex and one piece from l lobe  DISPOSITION OF SPECIMEN:  PATHOLOGY  COUNTS:  YES  TOURNIQUET:  * No tourniquets in log *  DICTATION: .Other Dictation: Dictation Number dictatio #[id number]  PLAN OF CARE: Discharge to home after PACU  PATIENT DISPOSITION:  PACU - hemodynamically stable.   Delay start of Pharmacological VTE agent (>24hrs) due to surgical blood loss or risk of bleeding:

## 2014-01-14 NOTE — Discharge Instructions (Signed)
Call if fever or bleeding   PATIENT INSTRUCTIONS POST-ANESTHESIA  IMMEDIATELY FOLLOWING SURGERY:  Do not drive or operate machinery for the first twenty four hours after surgery.  Do not make any important decisions for twenty four hours after surgery or while taking narcotic pain medications or sedatives.  If you develop intractable nausea and vomiting or a severe headache please notify your doctor immediately.  FOLLOW-UP:  Please make an appointment with your surgeon as instructed. You do not need to follow up with anesthesia unless specifically instructed to do so.  WOUND CARE INSTRUCTIONS (if applicable):  Keep a dry clean dressing on the anesthesia/puncture wound site if there is drainage.  Once the wound has quit draining you may leave it open to air.  Generally you should leave the bandage intact for twenty four hours unless there is drainage.  If the epidural site drains for more than 36-48 hours please call the anesthesia department.  QUESTIONS?:  Please feel free to call your physician or the hospital operator if you have any questions, and they will be happy to assist you.

## 2014-01-14 NOTE — Transfer of Care (Signed)
Immediate Anesthesia Transfer of Care Note  Patient: Jacob Frank  Procedure(s) Performed: Procedure(s) (LRB): PROSTATE BIOPSY (N/A)  Patient Location: PACU  Anesthesia Type: MAC  Level of Consciousness: awake  Airway & Oxygen Therapy: Patient Spontanous Breathing. Nasal cannula  Post-op Assessment: Report given to PACU RN, Post -op Vital signs reviewed and stable and Patient moving all extremities  Post vital signs: Reviewed and stable  Complications: No apparent anesthesia complications

## 2014-01-15 ENCOUNTER — Encounter (HOSPITAL_COMMUNITY): Payer: Self-pay | Admitting: Urology

## 2014-01-15 NOTE — Op Note (Signed)
NAME:  Jacob Frank, HALLUMS NO.:  [id number]  MEDICAL RECORD NO.:  [id number]  LOCATION:                                 FACILITY:  PHYSICIAN:  Marissa Nestle, M.D.DATE OF BIRTH:  1951/08/31  DATE OF PROCEDURE:  01/14/2014 DATE OF DISCHARGE:  01/14/2014                              OPERATIVE REPORT   PREOPERATIVE DIAGNOSIS:  Elevated PSA, hard area in the right lobe of the prostate near the apex.  PROCEDURE:  Transrectal needle biopsies of the prostate.  ANESTHESIA:  MAC.  DESCRIPTION OF PROCEDURE:  The patient under MAC anesthesia, in lithotomy position, after usual prep and drape, rectal examination was done.  Interestingly, when I did a rectal exam, I did not feel the right lobe of the prostate.  It was hard in that area near the apex, every time I did the examination in the office I could feel that area and I have done biopsy with the ultrasound in the office and it came back negative.  Hopefully, this comes out negative.  He was placed in lithotomy position under MAC anesthesia, after usual prep and drape, using automatic biopsy machine, multiple biopsies from near the apex of the right lobe of the prostate was done and one biopsy was done from the left side.  No complication.  The patient left the operating room in satisfactory condition.  He was given 400 mg IV Cipro preoperatively. On postop, I am going to give him Cipro for 3 days, 1 tab twice a day 250 mg #6 and I am going to see him back in 1 week in the office.     Marissa Nestle, M.D.     MIJ/MEDQ  D:  01/14/2014  T:  01/15/2014  Job:  [id number]

## 2014-01-18 ENCOUNTER — Encounter: Payer: Self-pay | Admitting: Family Medicine

## 2014-02-05 ENCOUNTER — Ambulatory Visit: Payer: BC Managed Care – PPO

## 2014-02-05 ENCOUNTER — Ambulatory Visit: Payer: BC Managed Care – PPO | Admitting: Family Medicine

## 2014-02-15 ENCOUNTER — Other Ambulatory Visit: Payer: Self-pay | Admitting: Family Medicine

## 2014-02-26 ENCOUNTER — Other Ambulatory Visit: Payer: Self-pay

## 2014-02-26 ENCOUNTER — Telehealth: Payer: Self-pay

## 2014-02-26 MED ORDER — ALPRAZOLAM 1 MG PO TABS: 1.0000 mg | ORAL_TABLET | Freq: Three times a day (TID) | ORAL | Status: DC | PRN

## 2014-02-26 NOTE — Telephone Encounter (Signed)
Rx faxed

## 2014-02-26 NOTE — Telephone Encounter (Signed)
[facility] requesting refill on Alprazolam 1 mg #90 TID. Last Ov 12/01/2013.

## 2014-02-26 NOTE — Telephone Encounter (Signed)
Alpraz rx printed. 

## 2014-03-16 ENCOUNTER — Other Ambulatory Visit: Payer: Self-pay

## 2014-03-16 MED ORDER — FLUOXETINE HCL 20 MG PO CAPS: 20.0000 mg | ORAL_CAPSULE | Freq: Every day | ORAL | Status: DC

## 2014-03-16 NOTE — Telephone Encounter (Signed)
Pt needs refill on Prozac.Refill sent.

## 2014-04-06 ENCOUNTER — Telehealth: Payer: Self-pay | Admitting: Family Medicine

## 2014-04-06 DIAGNOSIS — J410 Simple chronic bronchitis: Secondary | ICD-10-CM

## 2014-04-06 MED ORDER — NEBULIZER COMPRESSOR KIT: PACK | Status: DC

## 2014-04-06 MED ORDER — ALBUTEROL SULFATE (2.5 MG/3ML) 0.083% IN NEBU: 2.5000 mg | INHALATION_SOLUTION | Freq: Four times a day (QID) | RESPIRATORY_TRACT | Status: DC | PRN

## 2014-04-06 NOTE — Telephone Encounter (Signed)
Patient needs an RX for a nebulizer machine and medicine if possible sent to [facility].  He used to use his wife's machine but she is divorcing him so he doesn't have access to it anymore.  Please advise.

## 2014-04-06 NOTE — Telephone Encounter (Signed)
Patient aware.

## 2014-04-06 NOTE — Telephone Encounter (Signed)
Rxs sent

## 2014-04-13 ENCOUNTER — Ambulatory Visit (INDEPENDENT_AMBULATORY_CARE_PROVIDER_SITE_OTHER): Payer: BC Managed Care – PPO | Admitting: *Deleted

## 2014-04-13 DIAGNOSIS — E538 Deficiency of other specified B group vitamins: Secondary | ICD-10-CM

## 2014-04-13 MED ORDER — CYANOCOBALAMIN 1000 MCG/ML IJ SOLN
1000.0000 ug | Freq: Once | INTRAMUSCULAR | Status: AC
  Administered 2014-04-13: 1000 ug via INTRAMUSCULAR

## 2014-06-25 ENCOUNTER — Telehealth: Payer: Self-pay | Admitting: Family Medicine

## 2014-06-25 NOTE — Telephone Encounter (Signed)
The antibiotic sounds like an appropriate thing to take, at least for a few more days. I do not recommend pouring urine into the ear.

## 2014-06-25 NOTE — Telephone Encounter (Signed)
Left detailed message on pt's cell.  Okay according to prior message.

## 2014-06-25 NOTE — Telephone Encounter (Signed)
Patient states his ear is sore. He had a friend look in it & the friend said it was red. Patient said he thinks "there is a boil in it". He has gotten advise from 2 different people telling him to pour urine in his ear to get it to heal. Patient wants to know if it is true. Patient also mentioned that his cousin gave him some antibiotics to take. Patient states he has been taking Doxycyline for about a week. Some of the pills were 500 MG and some were 100 MG. He has been taking them 2 x day. 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

## 2014-06-30 ENCOUNTER — Other Ambulatory Visit: Payer: Self-pay | Admitting: Family Medicine

## 2014-08-09 ENCOUNTER — Other Ambulatory Visit: Payer: Self-pay | Admitting: Family Medicine

## 2014-09-02 ENCOUNTER — Telehealth: Payer: Self-pay | Admitting: Family Medicine

## 2014-09-02 MED ORDER — ALPRAZOLAM 1 MG PO TABS: 1.0000 mg | ORAL_TABLET | Freq: Three times a day (TID) | ORAL | Status: DC | PRN

## 2014-09-02 NOTE — Telephone Encounter (Signed)
Alpraz rx printed. I did the usual 30 d supply with 5 additional RF's.   This is the final rx he'll get, though, until he is seen for f/u in office (tell him to aim for f/u sometime in March 2016)-thx

## 2014-09-02 NOTE — Telephone Encounter (Signed)
Pharmacy sent request for pt's alprazolam.  Patient last OV was 12/01/13.  Last RX printed was 02/26/14 x 5 rfs.  Please advise.

## 2014-09-03 NOTE — Telephone Encounter (Signed)
Patient aware.  Rx faxed.

## 2014-11-22 ENCOUNTER — Other Ambulatory Visit: Payer: Self-pay | Admitting: Family Medicine

## 2015-02-16 ENCOUNTER — Other Ambulatory Visit: Payer: Self-pay | Admitting: Family Medicine

## 2015-02-21 ENCOUNTER — Encounter: Payer: Self-pay | Admitting: Family Medicine

## 2015-02-21 ENCOUNTER — Ambulatory Visit (INDEPENDENT_AMBULATORY_CARE_PROVIDER_SITE_OTHER): Payer: Medicaid Other | Admitting: Family Medicine

## 2015-02-21 VITALS — BP 131/93 | HR 71 | Temp 97.5°F | Resp 18 | Ht 68.25 in | Wt 174.0 lb

## 2015-02-21 DIAGNOSIS — Z87898 Personal history of other specified conditions: Secondary | ICD-10-CM

## 2015-02-21 DIAGNOSIS — Z0000 Encounter for general adult medical examination without abnormal findings: Secondary | ICD-10-CM | POA: Diagnosis not present

## 2015-02-21 DIAGNOSIS — I10 Essential (primary) hypertension: Secondary | ICD-10-CM | POA: Diagnosis not present

## 2015-02-21 DIAGNOSIS — E538 Deficiency of other specified B group vitamins: Secondary | ICD-10-CM

## 2015-02-21 DIAGNOSIS — Z23 Encounter for immunization: Secondary | ICD-10-CM | POA: Diagnosis not present

## 2015-02-21 DIAGNOSIS — E871 Hypo-osmolality and hyponatremia: Secondary | ICD-10-CM

## 2015-02-21 LAB — CBC WITH DIFFERENTIAL/PLATELET
BASOS ABS: 0 10*3/uL (ref 0.0–0.1)
BASOS PCT: 0.6 % (ref 0.0–3.0)
Eosinophils Absolute: 0 10*3/uL (ref 0.0–0.7)
Eosinophils Relative: 0.8 % (ref 0.0–5.0)
HEMATOCRIT: 42.8 % (ref 39.0–52.0)
Hemoglobin: 14.8 g/dL (ref 13.0–17.0)
LYMPHS ABS: 1.5 10*3/uL (ref 0.7–4.0)
Lymphocytes Relative: 27.2 % (ref 12.0–46.0)
MCHC: 34.7 g/dL (ref 30.0–36.0)
MCV: 93.1 fl (ref 78.0–100.0)
MONO ABS: 0.5 10*3/uL (ref 0.1–1.0)
Monocytes Relative: 8.4 % (ref 3.0–12.0)
NEUTROS ABS: 3.5 10*3/uL (ref 1.4–7.7)
NEUTROS PCT: 63 % (ref 43.0–77.0)
Platelets: 269 10*3/uL (ref 150.0–400.0)
RBC: 4.59 Mil/uL (ref 4.22–5.81)
RDW: 14.7 % (ref 11.5–15.5)
WBC: 5.6 10*3/uL (ref 4.0–10.5)

## 2015-02-21 LAB — LIPID PANEL
CHOL/HDL RATIO: 2
Cholesterol: 176 mg/dL (ref 0–200)
HDL: 97.4 mg/dL (ref >39.00)
LDL CALC: 58 mg/dL (ref 0–99)
NonHDL: 78.6
Triglycerides: 101 mg/dL (ref 0.0–149.0)
VLDL: 20.2 mg/dL (ref 0.0–40.0)

## 2015-02-21 LAB — COMPREHENSIVE METABOLIC PANEL
ALT: 13 U/L (ref 0–53)
AST: 15 U/L (ref 0–37)
Albumin: 4.6 g/dL (ref 3.5–5.2)
Alkaline Phosphatase: 36 U/L — ABNORMAL LOW (ref 39–117)
BUN: 8 mg/dL (ref 6–23)
CALCIUM: 9.5 mg/dL (ref 8.4–10.5)
CHLORIDE: 89 meq/L — AB (ref 96–112)
CO2: 32 mEq/L (ref 19–32)
Creatinine, Ser: 0.7 mg/dL (ref 0.40–1.50)
GFR: 121.24 mL/min (ref >60.00)
GLUCOSE: 85 mg/dL (ref 70–99)
Potassium: 4.3 mEq/L (ref 3.5–5.1)
SODIUM: 127 meq/L — AB (ref 135–145)
Total Bilirubin: 0.7 mg/dL (ref 0.2–1.2)
Total Protein: 6.9 g/dL (ref 6.0–8.3)

## 2015-02-21 LAB — TSH: TSH: 0.82 u[IU]/mL (ref 0.35–4.50)

## 2015-02-21 LAB — PSA, MEDICARE: PSA: 16.14 ng/ml — ABNORMAL HIGH (ref 0.10–4.00)

## 2015-02-21 MED ORDER — ALPRAZOLAM 1 MG PO TABS: 1.0000 mg | ORAL_TABLET | Freq: Three times a day (TID) | ORAL | Status: DC | PRN

## 2015-02-21 MED ORDER — CYANOCOBALAMIN 1000 MCG/ML IJ SOLN
1000.0000 ug | Freq: Once | INTRAMUSCULAR | Status: AC
  Administered 2015-02-21: 1000 ug via INTRAMUSCULAR

## 2015-02-21 MED ORDER — FLUTICASONE-SALMETEROL 250-50 MCG/DOSE IN AEPB: 1.0000 | INHALATION_SPRAY | Freq: Two times a day (BID) | RESPIRATORY_TRACT | Status: DC

## 2015-02-21 MED ORDER — FLUOXETINE HCL 20 MG PO CAPS: ORAL_CAPSULE | ORAL | Status: DC

## 2015-02-21 NOTE — Progress Notes (Signed)
Office Note 02/21/2015  CC:  Chief Complaint  Patient presents with  . Annual Exam    Pt is fasting  . B12 Injection    HPI:  Jacob Frank is a 64 y.o. White male who is here for annual health maintenance exam and vit B12 injection. Stressed, wife left him w/in the last year but now they are trying to work things out. Says he just got approved for disability.   Past Medical History  Diagnosis Date  . HTN (hypertension)   . Anxiety and depression   . Tobacco dependence     80-90 pack-yr hx  . History of substance abuse     cocaine and meth + IV drug use; pt claims he's been clean since 2005  . Vitamin B12 deficiency     hx unclear but pt states he's been getting monthly vit B12 injections and they help him feel better  . Furuncles     inner thighs; required I&D in the past  . COPD (chronic obstructive pulmonary disease)     hx of treatment with prn albuterol only due to pt refusing to pay for any daily preventative meds.  Has required many courses of systemic steroids.  . Erectile dysfunction   . History of acute prostatitis   . Hearing loss     ? 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  . History of hepatitis Distant past    Hep B surface antigen and antibody NEG and Hep C antibody testing neg; transaminases ok.  . Olecranon bursitis of right elbow 03/2013    Aspirated in office  . History of CVA (cerebrovascular accident)     right side of mouth droops  . Anxiety   . Arthritis   . Elevated PSA 2015    Prostate bx 12/2013: benign    Past Surgical History  Procedure Laterality Date  . Prostate biopsy N/A 01/14/2014    Procedure: PROSTATE BIOPSY;  Surgeon: Marissa Nestle, MD;  Location: [facility];  Service: Urology;  Laterality: N/A;  Dr. Michela Pitcher does not want ultrasound    Family History  Problem Relation Age of Onset  . Arthritis Mother   . Cirrhosis Mother   . Cancer Father     brain tumor    History   Social History  . Marital  Status: Married    Spouse Name: N/A  . Number of Children: N/A  . Years of Education: N/A   Occupational History  . Not on file.   Social History Main Topics  . Smoking status: Former Smoker -- 2.00 packs/day for 50 years    Types: Cigarettes    Quit date: 01/06/2014  . Smokeless tobacco: Never Used  . Alcohol Use: Yes     Comment: 1-2 x month (6-12 pk)  . Drug Use: No  . Sexual Activity: Not on file   Other Topics Concern  . Not on file   Social History Narrative   Divorced, remarried, has two adult children, 1 grandson.   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   Tobacco: 2-3 packs per day x 35+ yrs.     Alcohol 6-12 beers q month--"only when not on the road".  +Hx of ETOH abuse.   Admits to being a cocaine and meth addict in the past, says he's been clean since 2005.   He is unable to exercise due to COPD/breathing limitations.    Outpatient Prescriptions Prior to Visit  Medication Sig Dispense Refill  . albuterol (PROVENTIL HFA;VENTOLIN HFA) 108 (90 BASE) MCG/ACT inhaler Inhale 2 puffs into the lungs every 4 (four) hours as needed for wheezing or shortness of breath.    [name] albuterol (PROVENTIL) (2.5 MG/3ML) 0.083% nebulizer solution Take 3 mLs (2.5 mg total) by nebulization every 6 (six) hours as needed for wheezing or shortness of breath. 75 mL 1  . Ascorbic Acid (VITAMIN C PO) Take 1 tablet by mouth daily.    [name] aspirin 325 MG EC tablet Take 325 mg by mouth daily.    . cyanocobalamin (,VITAMIN B-12,) 1000 MCG/ML injection Inject 1,000 mcg into the muscle every 30 (thirty) days.    [name] lisinopril-hydrochlorothiazide (PRINZIDE,ZESTORETIC) 20-25 MG per tablet TAKE ONE TABLET BY MOUTH ONCE DAILY. 30 tablet 3  . Respiratory Therapy Supplies (NEBULIZER COMPRESSOR) KIT 1 unit dose of albuterol via nebulizer q4h prn wheezing and shortness of breath 1 each 0  . ALPRAZolam (XANAX) 1 MG tablet Take 1 tablet (1 mg total) by mouth 3 (three) times  daily as needed for anxiety. 90 tablet 5  . Fluticasone-Salmeterol (ADVAIR) 250-50 MCG/DOSE AEPB Inhale 1 puff into the lungs every 12 (twelve) hours.    . IRON PO Take 1 tablet by mouth daily.    . Omega-3 Fatty Acids (FISH OIL) 1000 MG CAPS Take 1 capsule by mouth daily.    [name] oxyCODONE-acetaminophen (PERCOCET) 7.5-325 MG per tablet Take 1 tablet by mouth every 4 (four) hours as needed for pain.    [name] FLUoxetine (PROZAC) 20 MG capsule TAKE ONE CAPSULE BY MOUTH ONCE DAILY. (Patient not taking: Reported on 02/21/2015) 30 capsule 0   No facility-administered medications prior to visit.    Allergies  Allergen Reactions  . Citalopram Other (See Comments)    Question of whether it was making him feel like his throat was "closed up".    ROS Review of Systems  Constitutional: Negative for fever and fatigue.  HENT: Negative for congestion and sore throat.   Eyes: Negative for visual disturbance.  Respiratory: Positive for cough, shortness of breath and wheezing.   Cardiovascular: Negative for chest pain.  Gastrointestinal: Negative for nausea and abdominal pain.  Genitourinary: Negative for dysuria.  Musculoskeletal: Negative for back pain and joint swelling.  Skin: Negative for rash.  Neurological: Negative for weakness and headaches.  Hematological: Negative for adenopathy.    PE; Blood pressure 131/93, pulse 71, temperature 97.5 F (36.4 C), temperature source Oral, resp. rate 18, height 5' 8.25" (1.734 m), weight 174 lb (78.926 kg), SpO2 98 %. Gen: Alert, well appearing.  Patient is oriented to person, place, time, and situation. ENT: Ears: EACs clear, normal epithelium.  TMs with good light reflex and landmarks bilaterally.  Eyes: no injection, icteris, swelling, or exudate.  EOMI, PERRLA.   Nose: no drainage or turbinate edema/swelling.  No injection or focal lesion.  Mouth: lips without lesion/swelling.  Oral mucosa pink and moist.  Edentulous. Oropharynx without erythema, exudate,  or swelling.  Neck: supple/nontender.  No LAD, mass, or TM.  Carotid pulses 2+ bilaterally, without bruits. CV: RRR, distant S1, S2 , no m/r/g.   LUNGS: Diffuse insp/exp coarse wheezing, prolonged exp phase, nonlabored resps, decent aeration in all lung fields. ABD: soft, NT/ND, BS normal.  No mass, no HSM, no bruit. Rectal exam: tone a bit diminished, without mass, lesions or tenderness. Prostate: mild left sided asymmetry w/out nodule or induration or tenderness.  Pertinent labs:  Lab Results  Component Value Date  TSH 0.43 12/01/2013   Lab Results  Component Value Date   WBC 5.9 01/08/2014   HGB 13.9 01/08/2014   HCT 40.8 01/08/2014   MCV 95.3 01/08/2014   PLT 195 01/08/2014   Lab Results  Component Value Date   CREATININE 0.98 01/08/2014   BUN 16 01/08/2014   NA 141 01/08/2014   K 5.1 01/08/2014   CL 100 01/08/2014   CO2 31 01/08/2014   Lab Results  Component Value Date   ALT 11 12/01/2013   AST 14 12/01/2013   ALKPHOS 31* 12/01/2013   BILITOT 0.5 12/01/2013   Lab Results  Component Value Date   CHOL 156 12/01/2013   Lab Results  Component Value Date   HDL 63.30 12/01/2013   Lab Results  Component Value Date   LDLCALC 54 12/01/2013   Lab Results  Component Value Date   TRIG 196.0* 12/01/2013   Lab Results  Component Value Date   CHOLHDL 2 12/01/2013   Lab Results  Component Value Date   PSA 13.46* 12/01/2013   PSA 7.71* 05/23/2012   PSA 7.22* 05/12/2012   ASSESSMENT AND PLAN:   1) Health maintenance exam:  Reviewed age and gender appropriate health maintenance issues (prudent diet, regular exercise, health risks of tobacco and excessive alcohol, use of seatbelts, fire alarms in home, use of sunscreen).  Also reviewed age and gender appropriate health screening as well as vaccine recommendations. Tdap today. HP labs today-fasting.  2) Vit b12 def by history; good response to vit B12 injections in the past.  3) Hx of elevated PSA; bx neg in  the past.  His urologist retired so we'll recheck PSA today and work on getting approp f/u with new urologist.  4) HTN: The current medical regimen is effective;  continue present plan and medications.  5) COPD: rf advair, continu albut q4h prn.  An After Visit Summary was printed and given to the patient.  FOLLOW UP:  Return in about 3 months (around 05/24/2015).

## 2015-02-21 NOTE — Progress Notes (Signed)
Pre visit review using our clinic review tool, if applicable. No additional management support is needed unless otherwise documented below in the visit note. 

## 2015-02-22 ENCOUNTER — Other Ambulatory Visit: Payer: Self-pay | Admitting: Family Medicine

## 2015-02-22 DIAGNOSIS — Z9889 Other specified postprocedural states: Secondary | ICD-10-CM

## 2015-02-22 DIAGNOSIS — R972 Elevated prostate specific antigen [PSA]: Secondary | ICD-10-CM

## 2015-02-22 MED ORDER — LISINOPRIL 40 MG PO TABS: 40.0000 mg | ORAL_TABLET | Freq: Every day | ORAL | Status: DC

## 2015-02-22 NOTE — Addendum Note (Signed)
Addended by: Lanae Crumbly on: 02/22/2015 01:53 PM   Modules accepted: Orders

## 2015-03-02 ENCOUNTER — Telehealth: Payer: Self-pay | Admitting: Family Medicine

## 2015-03-02 ENCOUNTER — Telehealth: Payer: Self-pay | Admitting: *Deleted

## 2015-03-02 NOTE — Telephone Encounter (Signed)
Pts wife called stating that she noticed that pt had oxycodone/apap on his avs. She wants to know should he be taking this. She states that he does not have a prescription. Please advise.

## 2015-03-02 NOTE — Telephone Encounter (Signed)
Pt advised that he needs to have blood work done in 2 weeks.

## 2015-03-02 NOTE — Telephone Encounter (Signed)
Pt states he received a call from [facility] to come in and see Dr. Anitra Lauth in 2 weeks and he is confused about this? Please call and confirm if he needs an appt. [place]

## 2015-03-03 ENCOUNTER — Other Ambulatory Visit: Payer: Self-pay | Admitting: Family Medicine

## 2015-03-03 NOTE — Telephone Encounter (Signed)
No he is not supposed to be on oxycodone anymore. I'll take this med off of his med list.

## 2015-03-04 NOTE — Telephone Encounter (Signed)
Phone number in chart no longer working.

## 2015-03-16 ENCOUNTER — Other Ambulatory Visit: Payer: Medicaid Other

## 2015-03-21 ENCOUNTER — Other Ambulatory Visit (INDEPENDENT_AMBULATORY_CARE_PROVIDER_SITE_OTHER): Payer: Medicaid Other

## 2015-03-21 ENCOUNTER — Ambulatory Visit (INDEPENDENT_AMBULATORY_CARE_PROVIDER_SITE_OTHER): Payer: Medicaid Other | Admitting: Family Medicine

## 2015-03-21 DIAGNOSIS — E871 Hypo-osmolality and hyponatremia: Secondary | ICD-10-CM

## 2015-03-21 DIAGNOSIS — E538 Deficiency of other specified B group vitamins: Secondary | ICD-10-CM | POA: Diagnosis not present

## 2015-03-21 LAB — BASIC METABOLIC PANEL
BUN: 10 mg/dL (ref 6–23)
CO2: 30 mEq/L (ref 19–32)
Calcium: 9.5 mg/dL (ref 8.4–10.5)
Chloride: 101 mEq/L (ref 96–112)
Creatinine, Ser: 0.84 mg/dL (ref 0.40–1.50)
GFR: 98.21 mL/min (ref >60.00)
GLUCOSE: 77 mg/dL (ref 70–99)
POTASSIUM: 4.6 meq/L (ref 3.5–5.1)
SODIUM: 136 meq/L (ref 135–145)

## 2015-03-21 MED ORDER — CYANOCOBALAMIN 1000 MCG/ML IJ SOLN
1000.0000 ug | Freq: Once | INTRAMUSCULAR | Status: AC
  Administered 2015-03-21: 1000 ug via INTRAMUSCULAR

## 2015-04-25 ENCOUNTER — Encounter: Payer: Self-pay | Admitting: Family Medicine

## 2015-05-02 ENCOUNTER — Other Ambulatory Visit: Payer: Self-pay | Admitting: Family Medicine

## 2015-05-02 NOTE — Telephone Encounter (Signed)
RF request for ProAir LOV: 02/21/15 Next ov: 05/23/15 Last written: Unknown.

## 2015-05-18 ENCOUNTER — Other Ambulatory Visit: Payer: Self-pay | Admitting: Family Medicine

## 2015-05-18 NOTE — Telephone Encounter (Signed)
RF request for albuterol LOV: 02/21/15 Next ov: 05/23/15 Last written: 04/07/15 83ml w/ 1RF

## 2015-05-23 ENCOUNTER — Encounter: Payer: Medicaid Other | Admitting: Family Medicine

## 2015-05-23 ENCOUNTER — Ambulatory Visit (INDEPENDENT_AMBULATORY_CARE_PROVIDER_SITE_OTHER): Payer: Medicaid Other | Admitting: Family Medicine

## 2015-05-23 ENCOUNTER — Encounter: Payer: Self-pay | Admitting: Family Medicine

## 2015-05-23 VITALS — BP 164/108 | HR 70 | Temp 97.9°F | Resp 20 | Ht 68.75 in | Wt 185.0 lb

## 2015-05-23 DIAGNOSIS — J438 Other emphysema: Secondary | ICD-10-CM

## 2015-05-23 DIAGNOSIS — I10 Essential (primary) hypertension: Secondary | ICD-10-CM | POA: Diagnosis not present

## 2015-05-23 DIAGNOSIS — F17200 Nicotine dependence, unspecified, uncomplicated: Secondary | ICD-10-CM | POA: Diagnosis not present

## 2015-05-23 MED ORDER — BUPROPION HCL ER (SMOKING DET) 150 MG PO TB12: 150.0000 mg | ORAL_TABLET | Freq: Two times a day (BID) | ORAL | Status: DC

## 2015-05-23 MED ORDER — AMLODIPINE BESYLATE 10 MG PO TABS: 10.0000 mg | ORAL_TABLET | Freq: Every day | ORAL | Status: DC

## 2015-05-23 NOTE — Progress Notes (Signed)
OFFICE VISIT  05/23/2015   CC:  Chief Complaint  Patient presents with  . Follow-up  . B12 Injection   HPI:    Patient is a 64 y.o. Caucasian male who presents for 3 mo f/u HTN tob dependence, COPD.  Still with chronic cough, rattly, mostly in mornings. No fevers, no recent acute change in cough/breathlessness, or sputum.  Still smoking, wants to quit but is not a chantix candidate due to strong past hx of anxiety/depression.  Checks bp only occ at [employer] [facility] and says it is "Okay" but can't give me a number. Continues to smoke a lot.  No HA's, no CP, no dizziness, no palpitations, no n/v.   Past Medical History  Diagnosis Date  . HTN (hypertension)   . Anxiety and depression   . Tobacco dependence     80-90 pack-yr hx  . History of substance abuse     cocaine and meth + IV drug use; pt claims he's been clean since 2005  . Vitamin B12 deficiency     hx unclear but pt states he's been getting monthly vit B12 injections and they help him feel better  . Furuncles     inner thighs; required I&D in the past  . COPD (chronic obstructive pulmonary disease)     hx of treatment with prn albuterol only due to pt refusing to pay for any daily preventative meds.  Has required many courses of systemic steroids.  . Erectile dysfunction   . History of acute prostatitis   . Hearing loss     ? 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  . History of hepatitis Distant past    Hep B surface antigen and antibody NEG and Hep C antibody testing neg; transaminases ok.  . Olecranon bursitis of right elbow 03/2013    Needle aspiration done in office  . Anxiety   . Arthritis   . Elevated PSA 2015    Prostate bx 12/2013: benign.  [facility] assumed his care 04/2015 and started abx and tamsulosin, repeated PSA (pending)    Past Surgical History  Procedure Laterality Date  . Prostate biopsy N/A 01/14/2014    Procedure: PROSTATE BIOPSY;  Surgeon: Marissa Nestle, MD;   Location: [facility];  Service: Urology;  Laterality: N/A;  Dr. Michela Pitcher does not want ultrasound    Outpatient Prescriptions Prior to Visit  Medication Sig Dispense Refill  . albuterol (PROVENTIL) (2.5 MG/3ML) 0.083% nebulizer solution INHALE ONE VIAL VIA NEBULIZER EVERY SIX HOURS AS NEEDED FOR WHEEZING OR SHORTNESS OF BREATH. 75 mL 1  . ALPRAZolam (XANAX) 1 MG tablet Take 1 tablet (1 mg total) by mouth 3 (three) times daily as needed for anxiety. 90 tablet 5  . Ascorbic Acid (VITAMIN C PO) Take 1 tablet by mouth daily.    [name] aspirin 325 MG EC tablet Take 325 mg by mouth daily.    . cyanocobalamin (,VITAMIN B-12,) 1000 MCG/ML injection Inject 1,000 mcg into the muscle every 30 (thirty) days.    [name] FLUoxetine (PROZAC) 20 MG capsule TAKE ONE CAPSULE BY MOUTH ONCE DAILY. 30 capsule 6  . Fluticasone-Salmeterol (ADVAIR) 250-50 MCG/DOSE AEPB Inhale 1 puff into the lungs every 12 (twelve) hours. 60 each 11  . lisinopril (PRINIVIL,ZESTRIL) 40 MG tablet Take 1 tablet (40 mg total) by mouth daily. 30 tablet 11  . Omega-3 Fatty Acids (FISH OIL) 1000 MG CAPS Take 1 capsule by mouth daily.    [name] PROAIR HFA 108 (90 BASE)  MCG/ACT inhaler 2 PUFFS EVERY FOUR HOURS AS NEEDED FOR WHEEZING 8.5 g 3  . Respiratory Therapy Supplies (NEBULIZER COMPRESSOR) KIT 1 unit dose of albuterol via nebulizer q4h prn wheezing and shortness of breath 1 each 0  . IRON PO Take 1 tablet by mouth daily.     No facility-administered medications prior to visit.    Allergies  Allergen Reactions  . Citalopram Other (See Comments)    Question of whether it was making him feel like his throat was "closed up".    ROS As per HPI  PE: Blood pressure 168/113, pulse 70, temperature 97.9 F (36.6 C), temperature source Oral, resp. rate 20, height 5' 8.75" (1.746 m), weight 185 lb (83.915 kg), SpO2 97 %. Repeat manual bp 164/108 R arm RRR, distant S1 and S2, no audible murmur/rub/gallop LUNGS: CTA bilat except occ exp rhonchi-soft, good  aeration. EXT: no clubbing, cyanosis, or edema.    LABS:  Lab Results  Component Value Date   TSH 0.82 02/21/2015   Lab Results  Component Value Date   WBC 5.6 02/21/2015   HGB 14.8 02/21/2015   HCT 42.8 02/21/2015   MCV 93.1 02/21/2015   PLT 269.0 02/21/2015   Lab Results  Component Value Date   CREATININE 0.84 03/21/2015   BUN 10 03/21/2015   NA 136 03/21/2015   K 4.6 03/21/2015   CL 101 03/21/2015   CO2 30 03/21/2015   Lab Results  Component Value Date   ALT 13 02/21/2015   AST 15 02/21/2015   ALKPHOS 36* 02/21/2015   BILITOT 0.7 02/21/2015   Lab Results  Component Value Date   CHOL 176 02/21/2015   Lab Results  Component Value Date   HDL 97.40 02/21/2015   Lab Results  Component Value Date   LDLCALC 58 02/21/2015   Lab Results  Component Value Date   TRIG 101.0 02/21/2015   Lab Results  Component Value Date   CHOLHDL 2 02/21/2015   IMPRESSION AND PLAN:  1) HTN: not well controlled.  Add amolipine 50m qd today.  Try to monitor at home/[employer] more. Recheck in office 2-3 weeks.  2) COPD: fairly stable, no changes today.  3) Tob cessation: Spent 10 minutes today discussing pt's smoking habit and the short and long term risks of smoking.  I clearly advised patient to completely quit smoking. Buprop 1583mbid rx'd today--Therapeutic expectations and side effect profile of medication discussed today.  Patient's questions answered.  Quit date of 06/02/15 chosen today by pt.  An After Visit Summary was printed and given to the patient.  FOLLOW UP: Return for f/u 2-3 weeks HTN and smoking cessation.

## 2015-05-23 NOTE — Progress Notes (Deleted)
OFFICE VISIT  05/23/2015   CC: No chief complaint on file.  HPI:    Opened in Error  Past Medical History  Diagnosis Date  . HTN (hypertension)   . Anxiety and depression   . Tobacco dependence     80-90 pack-yr hx  . History of substance abuse     cocaine and meth + IV drug use; pt claims he's been clean since 2005  . Vitamin B12 deficiency     hx unclear but pt states he's been getting monthly vit B12 injections and they help him feel better  . Furuncles     inner thighs; required I&D in the past  . COPD (chronic obstructive pulmonary disease)     hx of treatment with prn albuterol only due to pt refusing to pay for any daily preventative meds.  Has required many courses of systemic steroids.  . Erectile dysfunction   . History of acute prostatitis   . Hearing loss     ? 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  . History of hepatitis Distant past    Hep B surface antigen and antibody NEG and Hep C antibody testing neg; transaminases ok.  . Olecranon bursitis of right elbow 03/2013    Needle aspiration done in office  . Anxiety   . Arthritis   . Elevated PSA 2015    Prostate bx 12/2013: benign.  [facility] assumed his care 04/2015 and started abx and tamsulosin, repeated PSA (pending)    Past Surgical History  Procedure Laterality Date  . Prostate biopsy N/A 01/14/2014    Procedure: PROSTATE BIOPSY;  Surgeon: Marissa Nestle, MD;  Location: [facility];  Service: Urology;  Laterality: N/A;  Dr. Michela Pitcher does not want ultrasound    Outpatient Prescriptions Prior to Visit  Medication Sig Dispense Refill  . albuterol (PROVENTIL) (2.5 MG/3ML) 0.083% nebulizer solution INHALE ONE VIAL VIA NEBULIZER EVERY SIX HOURS AS NEEDED FOR WHEEZING OR SHORTNESS OF BREATH. 75 mL 1  . ALPRAZolam (XANAX) 1 MG tablet Take 1 tablet (1 mg total) by mouth 3 (three) times daily as needed for anxiety. 90 tablet 5  . Ascorbic Acid (VITAMIN C PO) Take 1 tablet by mouth daily.    [name]  aspirin 325 MG EC tablet Take 325 mg by mouth daily.    . cyanocobalamin (,VITAMIN B-12,) 1000 MCG/ML injection Inject 1,000 mcg into the muscle every 30 (thirty) days.    [name] FLUoxetine (PROZAC) 20 MG capsule TAKE ONE CAPSULE BY MOUTH ONCE DAILY. 30 capsule 6  . Fluticasone-Salmeterol (ADVAIR) 250-50 MCG/DOSE AEPB Inhale 1 puff into the lungs every 12 (twelve) hours. 60 each 11  . IRON PO Take 1 tablet by mouth daily.    [name] lisinopril (PRINIVIL,ZESTRIL) 40 MG tablet Take 1 tablet (40 mg total) by mouth daily. 30 tablet 11  . Omega-3 Fatty Acids (FISH OIL) 1000 MG CAPS Take 1 capsule by mouth daily.    [name] PROAIR HFA 108 (90 BASE) MCG/ACT inhaler 2 PUFFS EVERY FOUR HOURS AS NEEDED FOR WHEEZING 8.5 g 3  . Respiratory Therapy Supplies (NEBULIZER COMPRESSOR) KIT 1 unit dose of albuterol via nebulizer q4h prn wheezing and shortness of breath 1 each 0   No facility-administered medications prior to visit.    Allergies  Allergen Reactions  . Citalopram Other (See Comments)    Question of whether it was making him feel like his throat was "closed up".    ROS As per HPI  PE:  There were no vitals taken for this visit. ***  LABS:  Lab Results  Component Value Date   TSH 0.82 02/21/2015   Lab Results  Component Value Date   WBC 5.6 02/21/2015   HGB 14.8 02/21/2015   HCT 42.8 02/21/2015   MCV 93.1 02/21/2015   PLT 269.0 02/21/2015   Lab Results  Component Value Date   CREATININE 0.84 03/21/2015   BUN 10 03/21/2015   NA 136 03/21/2015   K 4.6 03/21/2015   CL 101 03/21/2015   CO2 30 03/21/2015   Lab Results  Component Value Date   ALT 13 02/21/2015   AST 15 02/21/2015   ALKPHOS 36* 02/21/2015   BILITOT 0.7 02/21/2015   Lab Results  Component Value Date   CHOL 176 02/21/2015   Lab Results  Component Value Date   HDL 97.40 02/21/2015   Lab Results  Component Value Date   LDLCALC 58 02/21/2015   Lab Results  Component Value Date   TRIG 101.0 02/21/2015   Lab Results   Component Value Date   CHOLHDL 2 02/21/2015   Lab Results  Component Value Date   PSA 16.14* 02/21/2015   PSA 13.46* 12/01/2013   PSA 7.71* 05/23/2012       IMPRESSION AND PLAN:  No problem-specific assessment & plan notes found for this encounter.   FOLLOW UP: No Follow-up on file.

## 2015-05-23 NOTE — Progress Notes (Signed)
Pre visit review using our clinic review tool, if applicable. No additional management support is needed unless otherwise documented below in the visit note. 

## 2015-05-25 NOTE — Progress Notes (Signed)
A user error has taken place: encounter opened in error, closed for administrative reasons.

## 2015-05-26 ENCOUNTER — Encounter: Payer: Self-pay | Admitting: Family Medicine

## 2015-06-08 ENCOUNTER — Encounter: Payer: Self-pay | Admitting: Family Medicine

## 2015-06-08 ENCOUNTER — Ambulatory Visit (INDEPENDENT_AMBULATORY_CARE_PROVIDER_SITE_OTHER): Payer: Medicaid Other | Admitting: Family Medicine

## 2015-06-08 VITALS — BP 111/76 | HR 77 | Temp 97.7°F | Resp 16 | Ht 68.75 in | Wt 180.0 lb

## 2015-06-08 DIAGNOSIS — E538 Deficiency of other specified B group vitamins: Secondary | ICD-10-CM | POA: Diagnosis not present

## 2015-06-08 DIAGNOSIS — Z0000 Encounter for general adult medical examination without abnormal findings: Secondary | ICD-10-CM

## 2015-06-08 DIAGNOSIS — I10 Essential (primary) hypertension: Secondary | ICD-10-CM

## 2015-06-08 DIAGNOSIS — F17200 Nicotine dependence, unspecified, uncomplicated: Secondary | ICD-10-CM | POA: Diagnosis not present

## 2015-06-08 DIAGNOSIS — R972 Elevated prostate specific antigen [PSA]: Secondary | ICD-10-CM

## 2015-06-08 DIAGNOSIS — Z23 Encounter for immunization: Secondary | ICD-10-CM

## 2015-06-08 MED ORDER — CYANOCOBALAMIN 1000 MCG/ML IJ SOLN
1000.0000 ug | Freq: Once | INTRAMUSCULAR | Status: AC
  Administered 2015-06-08: 1000 ug via INTRAMUSCULAR

## 2015-06-08 NOTE — Progress Notes (Signed)
OFFICE NOTE  06/08/2015  CC:  Chief Complaint  Patient presents with  . Follow-up    Pt is not fasting.   . B12 Injection    HPI: Patient is a 64 y.o. Caucasian male who is here for 2 week f/u HTN and smoking cessation. Added amlodipine 10mg  qd last visit. Also started bupropion 150mg  bid for smoking cessation at that time.  He thinks the bupropion is helping: smoking about 1/2 pack a day instead of 1 pack a day. He thinks his breathing is feeling a bit better lately.  No bp check since last visit.  No side effects noted from the meds.  He is going to get another prostate biopsy soon by Dr. Marica Otter.   Pertinent PMH:  Past medical, surgical, social, and family history reviewed and no changes are noted since last office visit.  MEDS:  Outpatient Prescriptions Prior to Visit  Medication Sig Dispense Refill  . albuterol (PROVENTIL) (2.5 MG/3ML) 0.083% nebulizer solution INHALE ONE VIAL VIA NEBULIZER EVERY SIX HOURS AS NEEDED FOR WHEEZING OR SHORTNESS OF BREATH. 75 mL 1  . ALPRAZolam (XANAX) 1 MG tablet Take 1 tablet (1 mg total) by mouth 3 (three) times daily as needed for anxiety. 90 tablet 5  . amLODipine (NORVASC) 10 MG tablet Take 1 tablet (10 mg total) by mouth daily. 30 tablet 3  . Ascorbic Acid (VITAMIN C PO) Take 1 tablet by mouth daily.    [name] aspirin 325 MG EC tablet Take 325 mg by mouth daily.    [name] buPROPion (ZYBAN) 150 MG 12 hr tablet Take 1 tablet (150 mg total) by mouth 2 (two) times daily. 60 tablet 2  . cyanocobalamin (,VITAMIN B-12,) 1000 MCG/ML injection Inject 1,000 mcg into the muscle every 30 (thirty) days.    [name] FLUoxetine (PROZAC) 20 MG capsule TAKE ONE CAPSULE BY MOUTH ONCE DAILY. 30 capsule 6  . Fluticasone-Salmeterol (ADVAIR) 250-50 MCG/DOSE AEPB Inhale 1 puff into the lungs every 12 (twelve) hours. 60 each 11  . lisinopril (PRINIVIL,ZESTRIL) 40 MG tablet Take 1 tablet (40 mg total) by mouth daily. 30 tablet 11  . Omega-3 Fatty Acids (FISH OIL) 1000 MG CAPS  Take 1 capsule by mouth daily.    [name] PROAIR HFA 108 (90 BASE) MCG/ACT inhaler 2 PUFFS EVERY FOUR HOURS AS NEEDED FOR WHEEZING 8.5 g 3  . Respiratory Therapy Supplies (NEBULIZER COMPRESSOR) KIT 1 unit dose of albuterol via nebulizer q4h prn wheezing and shortness of breath 1 each 0   No facility-administered medications prior to visit.    PE: Blood pressure 111/76, pulse 77, temperature 97.7 F (36.5 C), temperature source Oral, resp. rate 16, height 5' 8.75" (1.746 m), weight 180 lb (81.647 kg), SpO2 97 %. Gen: Alert, well appearing.  Patient is oriented to person, place, time, and situation. CV: RRR, slightly distant S1 and S2, no m/r/g.   LUNGS: CTA bilat, nonlabored resps, good aeration in all lung fields. EXT: no clubbing, cyanosis, or edema.    IMPRESSION AND PLAN:  1) HTN; improved/well controlled in office today. Continue current meds.    2) Tob dependence: doing ok with cessation so far--cutting back while using bupropion. Continue 150mg  bupropion bid and continue to work on cutting back.  3) Vit B12 def : 1000 mcg IM given today.  4) Prev health care: Flu vaccine IM today. Will try to clarify his pneumonia vaccine status in chart, as I suspect he has had pneumovax 23 in the past but we have no record  of it.    5) Elevated PSA: set to get repeat prostate bx soon.  An After Visit Summary was printed and given to the patient.   FOLLOW UP: 6 mo

## 2015-06-08 NOTE — Addendum Note (Signed)
Addended by: Lanae Crumbly on: 06/08/2015 11:07 AM   Modules accepted: Orders

## 2015-06-08 NOTE — Progress Notes (Signed)
Pre visit review using our clinic review tool, if applicable. No additional management support is needed unless otherwise documented below in the visit note. 

## 2015-06-28 ENCOUNTER — Encounter: Payer: Self-pay | Admitting: Family Medicine

## 2015-07-14 ENCOUNTER — Encounter: Payer: Self-pay | Admitting: Family Medicine

## 2015-07-22 ENCOUNTER — Other Ambulatory Visit: Payer: Self-pay | Admitting: Family Medicine

## 2015-07-25 NOTE — Telephone Encounter (Signed)
RF request for alprazolam. Last OV was 06/08/15 Last RX 02/21/15 x 5 rfs. Please advise.

## 2015-09-08 DIAGNOSIS — Z23 Encounter for immunization: Secondary | ICD-10-CM | POA: Diagnosis not present

## 2015-09-08 NOTE — Addendum Note (Signed)
Addended by: Lanae Crumbly on: 09/08/2015 07:56 AM   Modules accepted: Orders

## 2015-09-26 ENCOUNTER — Other Ambulatory Visit: Payer: Self-pay | Admitting: Family Medicine

## 2015-10-15 ENCOUNTER — Other Ambulatory Visit: Payer: Self-pay | Admitting: Family Medicine

## 2015-10-17 NOTE — Telephone Encounter (Signed)
RF request for proair LOV: 06/08/15 Next ov: None Last written: 05/02/15 8.5g w/ 3RF

## 2015-10-28 ENCOUNTER — Other Ambulatory Visit: Payer: Self-pay | Admitting: Family Medicine

## 2015-12-06 ENCOUNTER — Other Ambulatory Visit: Payer: Self-pay | Admitting: Family Medicine

## 2016-01-05 ENCOUNTER — Encounter: Payer: Self-pay | Admitting: Family Medicine

## 2016-01-05 ENCOUNTER — Ambulatory Visit (INDEPENDENT_AMBULATORY_CARE_PROVIDER_SITE_OTHER): Payer: Medicaid Other | Admitting: Family Medicine

## 2016-01-05 ENCOUNTER — Ambulatory Visit (HOSPITAL_COMMUNITY)
Admission: RE | Admit: 2016-01-05 | Discharge: 2016-01-05 | Disposition: A | Discharge status: Home / Self Care | Payer: Medicaid Other | Source: Ambulatory Visit | Attending: Family Medicine | Admitting: Family Medicine

## 2016-01-05 ENCOUNTER — Telehealth: Payer: Self-pay | Admitting: Family Medicine

## 2016-01-05 VITALS — BP 106/70 | HR 62 | Temp 98.1°F | Resp 16 | Ht 68.75 in | Wt 179.0 lb

## 2016-01-05 DIAGNOSIS — I10 Essential (primary) hypertension: Secondary | ICD-10-CM | POA: Diagnosis not present

## 2016-01-05 DIAGNOSIS — R918 Other nonspecific abnormal finding of lung field: Secondary | ICD-10-CM | POA: Diagnosis not present

## 2016-01-05 DIAGNOSIS — Z720 Tobacco use: Secondary | ICD-10-CM

## 2016-01-05 DIAGNOSIS — R5382 Chronic fatigue, unspecified: Secondary | ICD-10-CM

## 2016-01-05 DIAGNOSIS — J438 Other emphysema: Secondary | ICD-10-CM

## 2016-01-05 DIAGNOSIS — E538 Deficiency of other specified B group vitamins: Secondary | ICD-10-CM

## 2016-01-05 LAB — CBC WITH DIFFERENTIAL/PLATELET
BASOS ABS: 0 10*3/uL (ref 0.0–0.1)
Basophils Relative: 0.6 % (ref 0.0–3.0)
EOS ABS: 0.1 10*3/uL (ref 0.0–0.7)
Eosinophils Relative: 1.5 % (ref 0.0–5.0)
HEMATOCRIT: 40.4 % (ref 39.0–52.0)
HEMOGLOBIN: 13.6 g/dL (ref 13.0–17.0)
LYMPHS PCT: 30.2 % (ref 12.0–46.0)
Lymphs Abs: 2.1 10*3/uL (ref 0.7–4.0)
MCHC: 33.8 g/dL (ref 30.0–36.0)
MCV: 91.5 fl (ref 78.0–100.0)
MONOS PCT: 7.3 % (ref 3.0–12.0)
Monocytes Absolute: 0.5 10*3/uL (ref 0.1–1.0)
Neutro Abs: 4.2 10*3/uL (ref 1.4–7.7)
Neutrophils Relative %: 60.4 % (ref 43.0–77.0)
Platelets: 225 10*3/uL (ref 150.0–400.0)
RBC: 4.41 Mil/uL (ref 4.22–5.81)
RDW: 15 % (ref 11.5–15.5)
WBC: 7 10*3/uL (ref 4.0–10.5)

## 2016-01-05 LAB — C-REACTIVE PROTEIN: CRP: 0.6 mg/dL (ref 0.5–20.0)

## 2016-01-05 LAB — COMPREHENSIVE METABOLIC PANEL
ALBUMIN: 4.5 g/dL (ref 3.5–5.2)
ALK PHOS: 37 U/L — AB (ref 39–117)
ALT: 12 U/L (ref 0–53)
AST: 15 U/L (ref 0–37)
BUN: 10 mg/dL (ref 6–23)
CALCIUM: 9.4 mg/dL (ref 8.4–10.5)
CO2: 28 mEq/L (ref 19–32)
Chloride: 103 mEq/L (ref 96–112)
Creatinine, Ser: 0.88 mg/dL (ref 0.40–1.50)
GFR: 92.84 mL/min (ref >60.00)
Glucose, Bld: 81 mg/dL (ref 70–99)
POTASSIUM: 4.1 meq/L (ref 3.5–5.1)
Sodium: 141 mEq/L (ref 135–145)
TOTAL PROTEIN: 6.7 g/dL (ref 6.0–8.3)
Total Bilirubin: 0.4 mg/dL (ref 0.2–1.2)

## 2016-01-05 LAB — TSH: TSH: 0.87 u[IU]/mL (ref 0.35–4.50)

## 2016-01-05 LAB — SEDIMENTATION RATE: Sed Rate: 23 mm/hr — ABNORMAL HIGH (ref 0–22)

## 2016-01-05 IMAGING — DX DG CHEST 2V
2 series · 2 of 2 positions shown · non-contrast
Comparison: [DATE].

CLINICAL DATA: Chronic fatigue.

EXAM:
CHEST  2 VIEW

[chest pa]
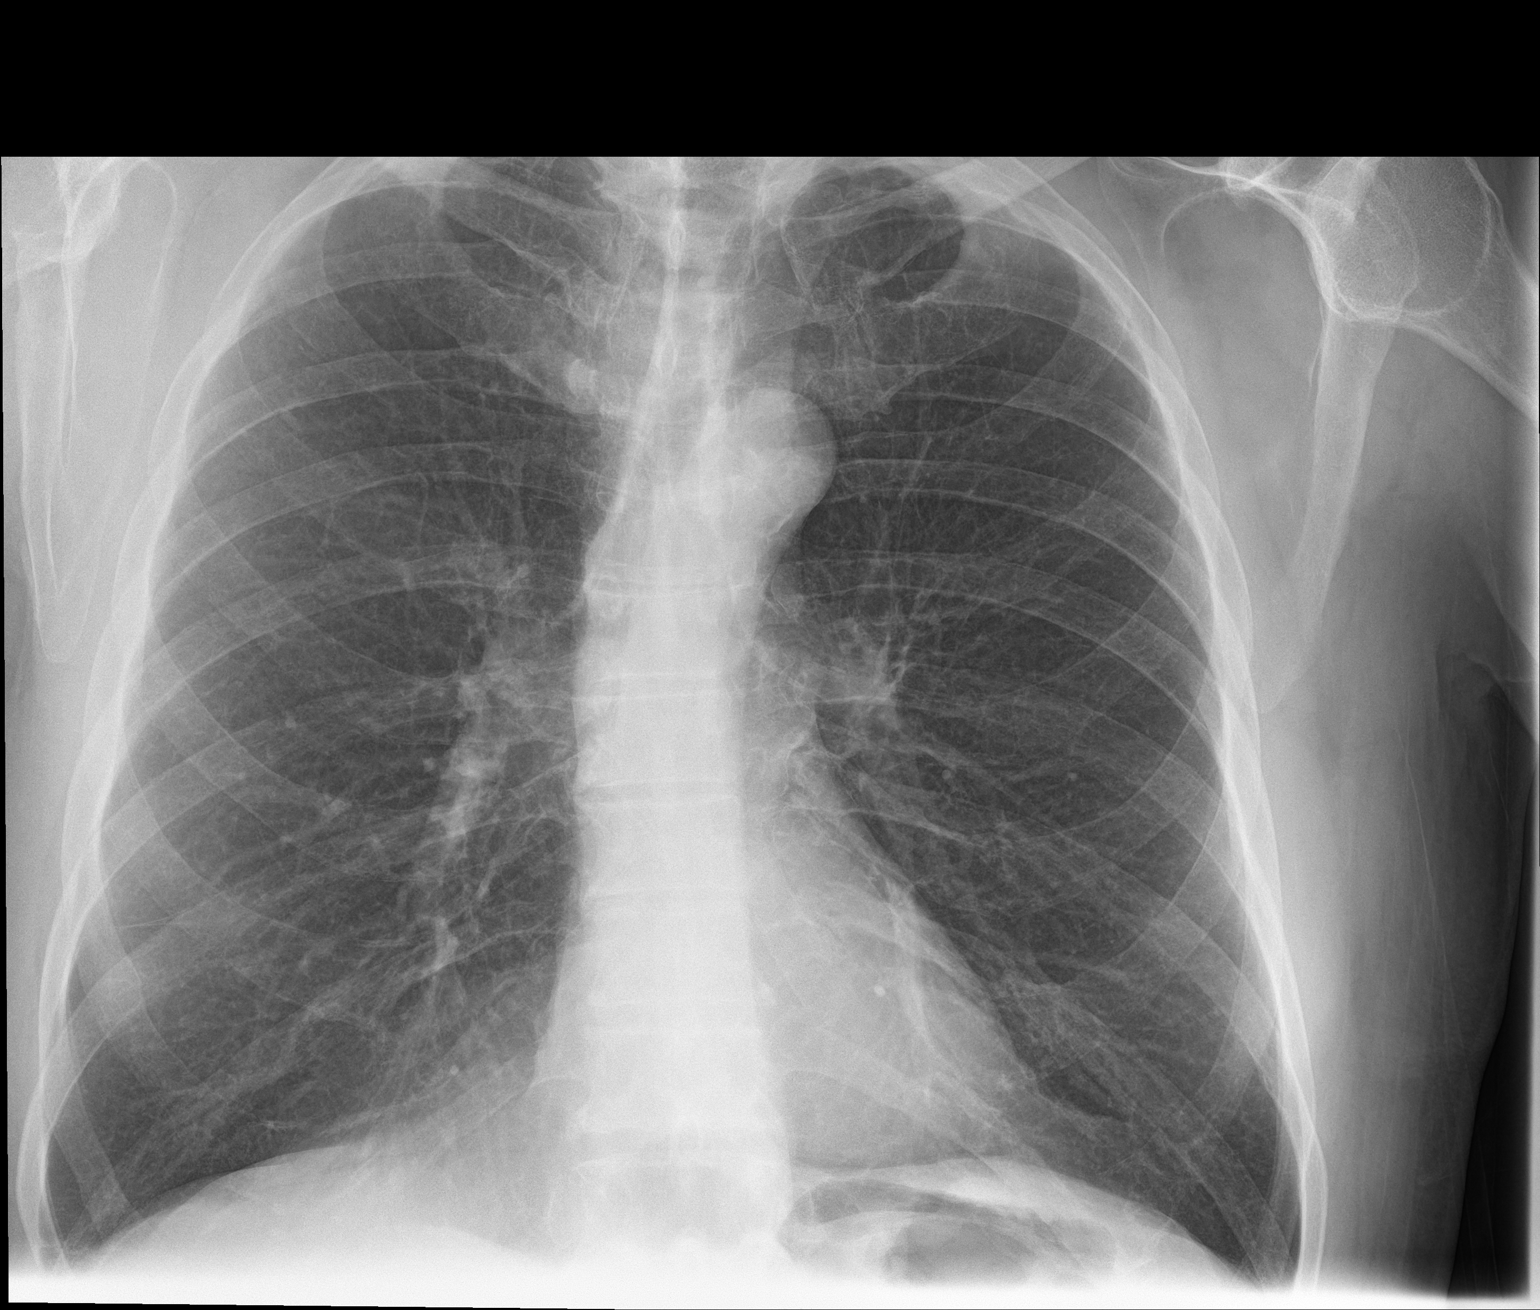

[chest lat]
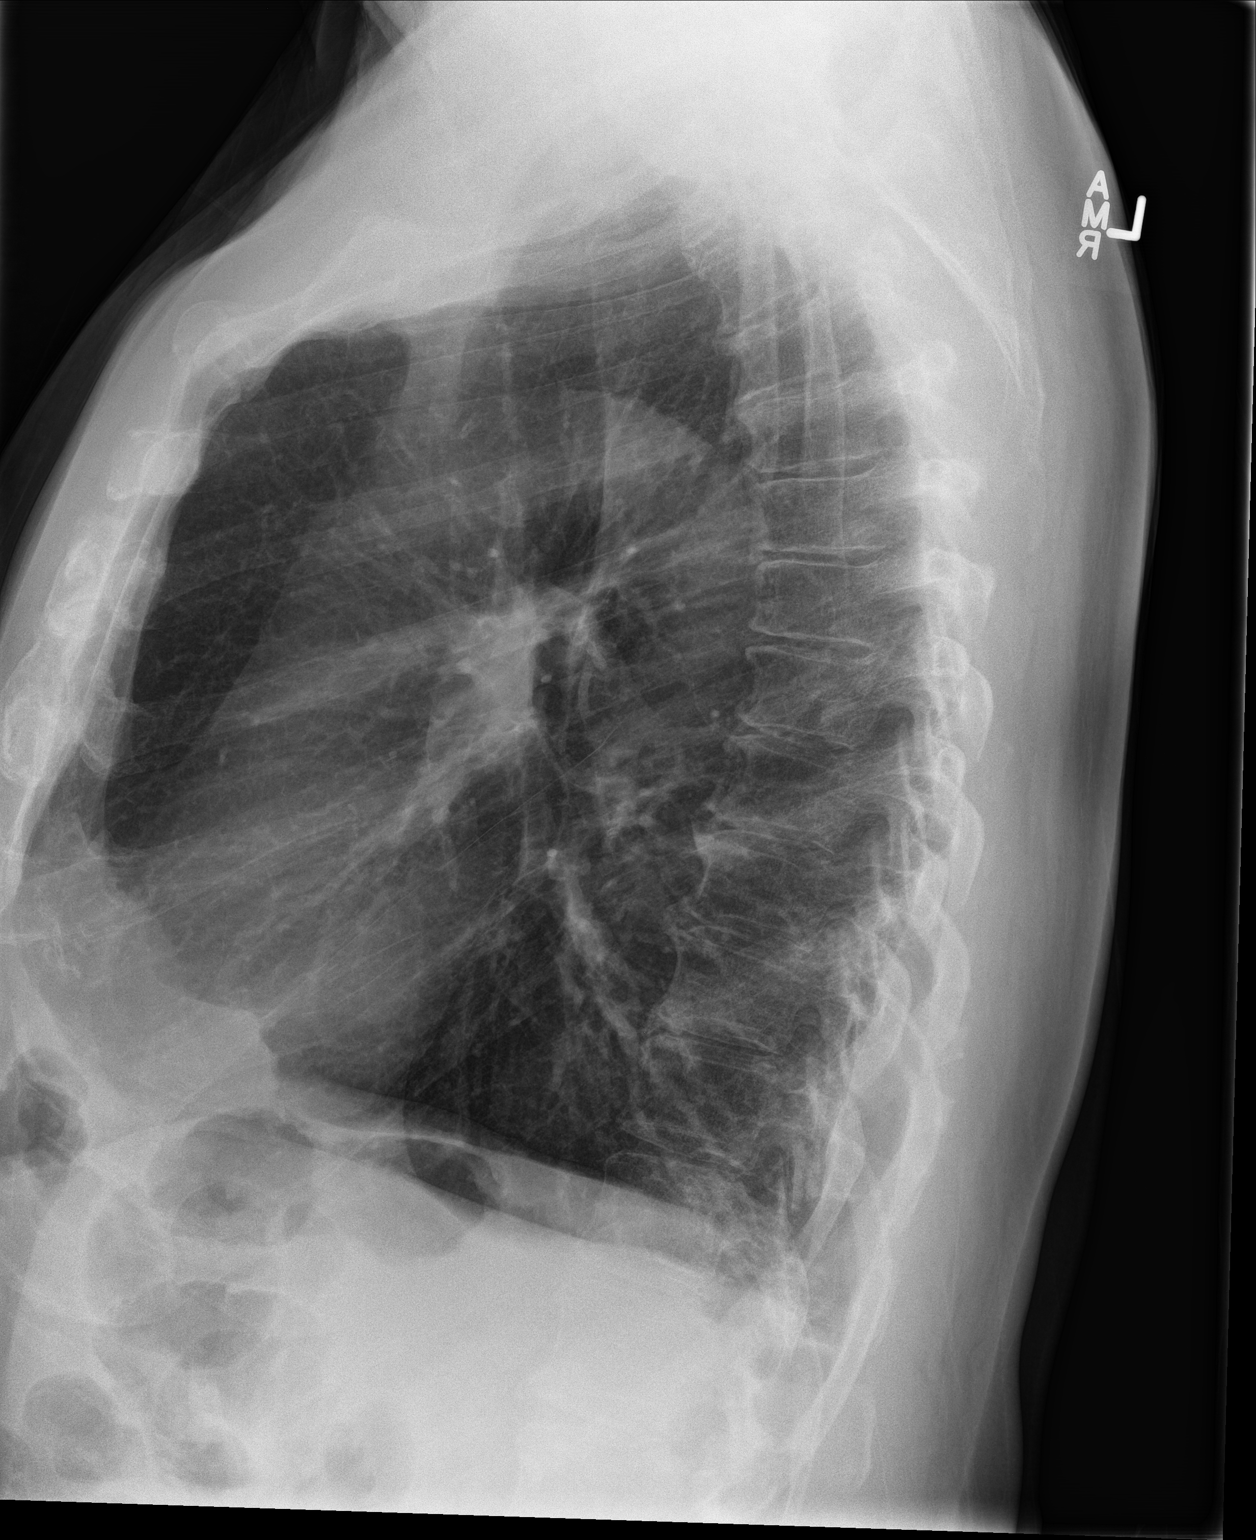

[2 of 2 positions shown; findings below may reference images not displayed]

FINDINGS: Mediastinum hilar structures normal. Lungs are clear. Heart size
normal. No pleural effusion or pneumothorax. Bilateral pleural
parenchymal thickening noted consistent with scarring. Degenerative
changes thoracic spine .
IMPRESSION: Bilateral pleural parenchymal thickening consistent with scarring.
No acute cardiopulmonary disease.

## 2016-01-05 MED ORDER — CYANOCOBALAMIN 1000 MCG/ML IJ SOLN
1000.0000 ug | Freq: Once | INTRAMUSCULAR | Status: AC
  Administered 2016-01-05: 1000 ug via INTRAMUSCULAR

## 2016-01-05 MED ORDER — ALBUTEROL SULFATE (2.5 MG/3ML) 0.083% IN NEBU: INHALATION_SOLUTION | RESPIRATORY_TRACT | Status: DC

## 2016-01-05 NOTE — Progress Notes (Signed)
Pre visit review using our clinic review tool, if applicable. No additional management support is needed unless otherwise documented below in the visit note. 

## 2016-01-05 NOTE — Telephone Encounter (Signed)
Patient requests medication for his nebulizer sent to [facility]

## 2016-01-05 NOTE — Progress Notes (Signed)
OFFICE VISIT  01/05/2016   CC:  Chief Complaint  Patient presents with  . Follow-up    Pt is not fasting.    HPI:    Patient is a 65 y.o. Caucasian male who presents for 6 mo f/u active chronic illness f/u: HTN, COPD, tob abuse.  Says in general he doesn't feel very good. Blurry vision intermittent, fatigue, "for a while".  He can't be more specific.  Pt is very poor historian. Denies polyuria, polydipsia, or polyphagia. 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  Still smoking 1/2 pack cigs/day, says breathing feels fine lately.  HTN: no home/outside bp monitoring.  Pt is compliant with meds w/out side effect.   Past Medical History  Diagnosis Date  . HTN (hypertension)   . Anxiety and depression   . Tobacco dependence     80-90 pack-yr hx  . History of substance abuse     cocaine and meth + IV drug use; pt claims he's been clean since 2005  . Vitamin B12 deficiency     hx unclear but pt states he's been getting monthly vit B12 injections and they help him feel better  . Furuncles     inner thighs; required I&D in the past  . COPD (chronic obstructive pulmonary disease) (HCC)     hx of treatment with prn albuterol only due to pt refusing to pay for any daily preventative meds.  Has required many courses of systemic steroids.  . Erectile dysfunction   . History of acute prostatitis   . Hearing loss     ? 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  . History of hepatitis Distant past    Hep B surface antigen and antibody NEG and Hep C antibody testing neg; transaminases ok.  . Olecranon bursitis of right elbow 03/2013    Needle aspiration done in office  . Anxiety   . Arthritis   . Elevated PSA 2015/16    Prostate bx 12/2013: benign.  [facility] assumed his care 04/2015 and repeat biopsy done was again BENIGN.  Likely needs re-biopsy 1 yr per urologist.    Past Surgical History  Procedure Laterality Date  . Prostate biopsy N/A 01/14/2014   Procedure: PROSTATE BIOPSY;  Surgeon: Marissa Nestle, MD;  Location: [facility];  Service: Urology;  Laterality: N/A;  Dr. Michela Pitcher does not want ultrasound    Outpatient Prescriptions Prior to Visit  Medication Sig Dispense Refill  . ALPRAZolam (XANAX) 1 MG tablet TAKE ONE THREE TIMES DAILY AS NEEDED 90 tablet 5  . amLODipine (NORVASC) 10 MG tablet TAKE ONE TABLET BY MOUTH ONCE DAILY. 30 tablet 0  . Ascorbic Acid (VITAMIN C PO) Take 1 tablet by mouth daily.    [name] aspirin 325 MG EC tablet Take 325 mg by mouth daily.    [name] buPROPion (ZYBAN) 150 MG 12 hr tablet Take 1 tablet (150 mg total) by mouth 2 (two) times daily. 60 tablet 2  . cyanocobalamin (,VITAMIN B-12,) 1000 MCG/ML injection Inject 1,000 mcg into the muscle every 30 (thirty) days.    [name] FLUoxetine (PROZAC) 20 MG capsule TAKE ONE CAPSULE BY MOUTH ONCE DAILY. 30 capsule 0  . Fluticasone-Salmeterol (ADVAIR) 250-50 MCG/DOSE AEPB Inhale 1 puff into the lungs every 12 (twelve) hours. 60 each 11  . lisinopril (PRINIVIL,ZESTRIL) 40 MG tablet Take 1 tablet (40 mg total) by mouth daily. 30 tablet 11  . Omega-3 Fatty Acids (FISH OIL) 1000 MG CAPS Take 1 capsule  by mouth daily.    [name] PROAIR HFA 108 (90 Base) MCG/ACT inhaler 2 PUFFS EVERY FOUR HOURS AS NEEDED FOR WHEEZING 8.5 g 3  . Respiratory Therapy Supplies (NEBULIZER COMPRESSOR) KIT 1 unit dose of albuterol via nebulizer q4h prn wheezing and shortness of breath 1 each 0  . albuterol (PROVENTIL) (2.5 MG/3ML) 0.083% nebulizer solution INHALE ONE VIAL VIA NEBULIZER EVERY SIX HOURS AS NEEDED FOR WHEEZING OR SHORTNESS OF BREATH. 75 mL 1   No facility-administered medications prior to visit.    Allergies  Allergen Reactions  . Citalopram Other (See Comments)    Question of whether it was making him feel like his throat was "closed up".    ROS As per HPI  PE: Blood pressure 106/70, pulse 62, temperature 98.1 F (36.7 C), temperature source Oral, resp. rate 16, height 5' 8.75" (1.746 m), weight  179 lb (81.194 kg), SpO2 95 %. Gen: Alert, well appearing.  Patient is oriented to person, place, time, and situation. WCH:ENID: no injection, icteris, swelling, or exudate.  EOMI, PERRLA. Mouth: lips without lesion/swelling.  Oral mucosa pink and moist. Oropharynx without erythema, exudate, or swelling.  Neck - No masses or thyromegaly or limitation in range of motion CV: RRR, no m/r/g.   LUNGS: CTA bilat, nonlabored resps, good aeration in all lung fields. EXT: no clubbing, cyanosis, or edema.   LABS:  None today  IMPRESSION AND PLAN:  1) Fatigue: suspect this is multifactorial and due to his chronic disease processes and ongoing cigarette smoking. Will check CXR, ESR, TSH, CMET, CBC, CRP.  2) COPD: continue prn albuterol.  Emphasized need to completely quit smoking.  3) HTN: The current medical regimen is effective;  continue present plan and medications.  4) Hx of elevated PSA: he is s/p bx x 2, both benign.  He is adamant that he will not let anyone do another biopsy on him.  Nevertheless, I encouraged him to keep f/u appt with urologist.  An After Visit Summary was printed and given to the patient.  FOLLOW UP: Return in about 4 months (around 05/06/2016) for routine chronic illness f/u.  Signed:  Crissie Sickles, MD           01/05/2016

## 2016-01-06 NOTE — Telephone Encounter (Signed)
Called [facility]#. No answer. Left detailed message per DPR. Placed letter up front for pick up.

## 2016-01-06 NOTE — Telephone Encounter (Signed)
This encounter was created in error - please disregard.

## 2016-01-06 NOTE — Telephone Encounter (Signed)
-----   Message from [name], MD sent at 01/06/2016  7:57 AM EDT ----- All labs AND chest x-ray were normal.  Reassure patient.  Pls tell him the letter he requested has been done--does he want it mailed or does he want to pick it up?

## 2016-01-09 NOTE — Telephone Encounter (Signed)
Pt called office this morning for lab and cxr results. Pt was advised and voiced understanding. He requested that letter be mailed to him at his [address]. Address was confirmed and letter put in mail today.

## 2016-02-02 ENCOUNTER — Other Ambulatory Visit: Payer: Self-pay | Admitting: Family Medicine

## 2016-02-09 ENCOUNTER — Other Ambulatory Visit: Payer: Self-pay | Admitting: Family Medicine

## 2016-02-10 NOTE — Telephone Encounter (Signed)
RF request for alprazolam LOV: 01/05/16 Next ov: None Last written: 07/26/15 #90 w/ 5RF  Please advise. Thanks.

## 2016-02-10 NOTE — Telephone Encounter (Signed)
Rx faxed

## 2016-03-09 ENCOUNTER — Other Ambulatory Visit: Payer: Self-pay | Admitting: Family Medicine

## 2016-03-12 ENCOUNTER — Other Ambulatory Visit: Payer: Self-pay | Admitting: Family Medicine

## 2016-03-17 ENCOUNTER — Other Ambulatory Visit: Payer: Self-pay | Admitting: Family Medicine

## 2016-03-19 NOTE — Telephone Encounter (Signed)
RF request for Advair LOV: 01/05/16 Next ov: None Last written: 02/21/15 #60 w/ 11RF

## 2016-04-30 ENCOUNTER — Other Ambulatory Visit: Payer: Self-pay | Admitting: Family Medicine

## 2016-04-30 NOTE — Telephone Encounter (Signed)
RF request for nystatin - medication not on pts med list - please advise. Thanks.  LOV: 01/05/16 Next ov: None Last written: unknown  RF request for proair - Rx sent.  Last written: 03/09/16 #8.5g w/ 1RF

## 2016-05-09 ENCOUNTER — Other Ambulatory Visit: Payer: Self-pay | Admitting: Family Medicine

## 2016-06-12 ENCOUNTER — Other Ambulatory Visit: Payer: Self-pay | Admitting: Family Medicine

## 2016-06-27 ENCOUNTER — Ambulatory Visit: Payer: Medicaid Other | Admitting: Family Medicine

## 2016-06-27 ENCOUNTER — Telehealth: Payer: Self-pay | Admitting: Family Medicine

## 2016-06-27 NOTE — Telephone Encounter (Signed)
Noted  

## 2016-06-27 NOTE — Telephone Encounter (Signed)
Patient arrived to office at 9:20am for scheduled 9:00am appt.  I informed patient that his appt was scheduled at 9am and that I needed to consult with PCP to see if he was still able to see him.    After consulting with PCP, patient was instructed he needed to reschedule his appt due to arriving after scheduled appt time.  I offered to reschedule appt for patient.  However he became upset and walked out of the office. Patient came back in a few moments later and asked when he could be rescheduled and I informed him we could r/s appt to tomorrow (06/28/16).  Patient stated he had something to do tomorrow and walked out of the door again.  He returned back and requested to be transferred to another [place] office.  Patient left and then called back to the office requesting to speak with Diane, I informed him Diane was out of the office and asked if I could assist him.  He states he wants to speak with Dr. Anitra Lauth because he doesn't feel this is fair as patient states he was told his appt was at 9:30am and not 9:00am.  After which patient hung up the phone.

## 2016-07-04 ENCOUNTER — Ambulatory Visit (INDEPENDENT_AMBULATORY_CARE_PROVIDER_SITE_OTHER): Payer: Medicaid Other | Admitting: Family Medicine

## 2016-07-04 ENCOUNTER — Encounter: Payer: Self-pay | Admitting: Family Medicine

## 2016-07-04 VITALS — BP 115/77 | HR 63 | Temp 98.0°F | Resp 18 | Wt 170.8 lb

## 2016-07-04 DIAGNOSIS — J438 Other emphysema: Secondary | ICD-10-CM | POA: Diagnosis not present

## 2016-07-04 DIAGNOSIS — F418 Other specified anxiety disorders: Secondary | ICD-10-CM

## 2016-07-04 DIAGNOSIS — F419 Anxiety disorder, unspecified: Principal | ICD-10-CM

## 2016-07-04 DIAGNOSIS — Z23 Encounter for immunization: Secondary | ICD-10-CM

## 2016-07-04 DIAGNOSIS — I10 Essential (primary) hypertension: Secondary | ICD-10-CM | POA: Diagnosis not present

## 2016-07-04 DIAGNOSIS — E538 Deficiency of other specified B group vitamins: Secondary | ICD-10-CM

## 2016-07-04 DIAGNOSIS — F329 Major depressive disorder, single episode, unspecified: Secondary | ICD-10-CM

## 2016-07-04 DIAGNOSIS — F17200 Nicotine dependence, unspecified, uncomplicated: Secondary | ICD-10-CM | POA: Diagnosis not present

## 2016-07-04 MED ORDER — CYANOCOBALAMIN 1000 MCG/ML IJ SOLN
1000.0000 ug | Freq: Once | INTRAMUSCULAR | Status: AC
  Administered 2016-07-04: 1000 ug via INTRAMUSCULAR

## 2016-07-04 MED ORDER — FLUOXETINE HCL 40 MG PO CAPS: 40.0000 mg | ORAL_CAPSULE | Freq: Every day | ORAL | 3 refills | Status: DC

## 2016-07-04 MED ORDER — VARENICLINE TARTRATE 1 MG PO TABS: 1.0000 mg | ORAL_TABLET | Freq: Two times a day (BID) | ORAL | 1 refills | Status: DC

## 2016-07-04 MED ORDER — VARENICLINE TARTRATE 0.5 MG X 11 & 1 MG X 42 PO MISC: ORAL | 0 refills | Status: DC

## 2016-07-04 NOTE — Addendum Note (Signed)
Addended by: Gordy Councilman on: 07/04/2016 02:07 PM   Modules accepted: Orders

## 2016-07-04 NOTE — Progress Notes (Signed)
Pre visit review using our clinic review tool, if applicable. No additional management support is needed unless otherwise documented below in the visit note. 

## 2016-07-04 NOTE — Progress Notes (Signed)
OFFICE VISIT  07/04/2016   CC:  Chief Complaint  Patient presents with  . Follow-up    HTN, discuss medications    HPI:    Patient is a 65 y.o. Caucasian male who presents for f/u HTN, anxiety/depression, copd. Still has the "blahs" and feels like the prozac needs to be increased.  +Anhedonia, poor concentration, irritability, has started smoking again.  No SI or HI.  He asks about trial of chantix--currently smoking 1/2-1 pack per day.  About 1 week ago developed painful rash on buttocks.  Now still has one small clump of blisters in his words.  Taking bp med daily.  No home bp monitoring.  No CP, no palp's, no HAs, no vision complaints.  Past Medical History:  Diagnosis Date  . Anxiety   . Anxiety and depression   . Arthritis   . COPD (chronic obstructive pulmonary disease) (HCC)    hx of treatment with prn albuterol only due to pt refusing to pay for any daily preventative meds.  Has required many courses of systemic steroids.  . Elevated PSA 2015/16   Prostate bx 12/2013: benign.  [facility] assumed his care 04/2015 and repeat biopsy done was again BENIGN.  Likely needs re-biopsy 1 yr per urologist.  . Erectile dysfunction   . Furuncles    inner thighs; required I&D in the past  . Hearing loss    ? 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  . History of acute prostatitis   . History of hepatitis Distant past   Hep B surface antigen and antibody NEG and Hep C antibody testing neg; transaminases ok.  . History of substance abuse    cocaine and meth + IV drug use; pt claims he's been clean since 2005  . HTN (hypertension)   . Olecranon bursitis of right elbow 03/2013   Needle aspiration done in office  . Tobacco dependence    80-90 pack-yr hx  . Vitamin B12 deficiency    hx unclear but pt states he's been getting monthly vit B12 injections and they help him feel better    Past Surgical History:  Procedure Laterality Date  . PROSTATE BIOPSY N/A  01/14/2014   Procedure: PROSTATE BIOPSY;  Surgeon: Marissa Nestle, MD;  Location: [facility];  Service: Urology;  Laterality: N/A;  Dr. Michela Pitcher does not want ultrasound    Outpatient Medications Prior to Visit  Medication Sig Dispense Refill  . ADVAIR DISKUS 250-50 MCG/DOSE AEPB INHALE ONE PUFF EVERY 12 HOURS. 60 each 11  . albuterol (PROVENTIL) (2.5 MG/3ML) 0.083% nebulizer solution INHALE ONE VIAL VIA NEBULIZER EVERY SIX HOURS AS NEEDED FOR WHEEZING OR SHORTNESS OF BREATH. 75 mL 1  . ALPRAZolam (XANAX) 1 MG tablet TAKE ONE THREE TIMES DAILY AS NEEDED 90 tablet 5  . amLODipine (NORVASC) 10 MG tablet TAKE ONE TABLET BY MOUTH ONCE DAILY. 30 tablet 0  . Ascorbic Acid (VITAMIN C PO) Take 1 tablet by mouth daily.    [name] aspirin 325 MG EC tablet Take 325 mg by mouth daily.    [name] buPROPion (ZYBAN) 150 MG 12 hr tablet Take 1 tablet (150 mg total) by mouth 2 (two) times daily. 60 tablet 2  . cyanocobalamin (,VITAMIN B-12,) 1000 MCG/ML injection Inject 1,000 mcg into the muscle every 30 (thirty) days.    [name] lisinopril (PRINIVIL,ZESTRIL) 40 MG tablet TAKE ONE TABLET BY MOUTH ONCE DAILY. 30 tablet 0  . nystatin cream (MYCOSTATIN) APPLY TO THE AFFECTED AREAS TWICE  DAILY FOR 2 WEEKS. 30 g 0  . Omega-3 Fatty Acids (FISH OIL) 1000 MG CAPS Take 1 capsule by mouth daily.    [name] PROAIR HFA 108 (90 Base) MCG/ACT inhaler 2 PUFFS EVERY FOUR HOURS AS NEEDED FOR WHEEZING 8.5 g 6  . Respiratory Therapy Supplies (NEBULIZER COMPRESSOR) KIT 1 unit dose of albuterol via nebulizer q4h prn wheezing and shortness of breath 1 each 0  . FLUoxetine (PROZAC) 20 MG capsule TAKE ONE CAPSULE BY MOUTH ONCE DAILY. 30 capsule 0   No facility-administered medications prior to visit.     Allergies  Allergen Reactions  . Citalopram Other (See Comments)    Question of whether it was making him feel like his throat was "closed up".    ROS As per HPI  PE: Blood pressure 115/77, pulse 63, temperature 98 F (36.7 C), temperature source  Oral, resp. rate 18, weight 170 lb 12.8 oz (77.5 kg), SpO2 96 %. Gen: Alert, well appearing.  Patient is oriented to person, place, time, and situation. AFFECT: pleasant, lucid thought and speech. CV: RRR, no m/r/g.   LUNGS: CTA bilat, nonlabored resps, good aeration in all lung fields.   LABS:  Lab Results  Component Value Date   TSH 0.87 01/05/2016   Lab Results  Component Value Date   WBC 7.0 01/05/2016   HGB 13.6 01/05/2016   HCT 40.4 01/05/2016   MCV 91.5 01/05/2016   PLT 225.0 01/05/2016   Lab Results  Component Value Date   CREATININE 0.88 01/05/2016   BUN 10 01/05/2016   NA 141 01/05/2016   K 4.1 01/05/2016   CL 103 01/05/2016   CO2 28 01/05/2016   Lab Results  Component Value Date   ALT 12 01/05/2016   AST 15 01/05/2016   ALKPHOS 37 (L) 01/05/2016   BILITOT 0.4 01/05/2016   Lab Results  Component Value Date   CHOL 176 02/21/2015   Lab Results  Component Value Date   HDL 97.40 02/21/2015   Lab Results  Component Value Date   LDLCALC 58 02/21/2015   Lab Results  Component Value Date   TRIG 101.0 02/21/2015   Lab Results  Component Value Date   CHOLHDL 2 02/21/2015   Lab Results  Component Value Date   PSA 16.14 (H) 02/21/2015   PSA 13.46 (H) 12/01/2013   PSA 7.71 (H) 05/23/2012     IMPRESSION AND PLAN:  1) Depression and anxiety: not ideally controlled on 69m fluoxetine.  Increase fluoxetine to 467mqd.  2) Tobacco dependence: will do trial of chantix--eRx'd today.  Therapeutic expectations and side effect profile of medication discussed today.  Patient's questions answered.  3) HTN: The current medical regimen is effective;  continue present plan and medications. Recheck lytes/cr at f/u in 6 weeks.  4) COPD: stable, compliant with meds, needs to quit smoking: see #2 above.  5) Vit B12 def: vit B12 1000 mcg IM today.  Flu vaccine given today.  An After Visit Summary was printed and given to the patient.  FOLLOW UP: Return in  about 6 weeks (around 08/15/2016) for f/u depression and tob dependence.  Signed:  PhCrissie SicklesMD           07/04/2016

## 2016-07-14 ENCOUNTER — Other Ambulatory Visit: Payer: Self-pay | Admitting: Family Medicine

## 2016-08-03 ENCOUNTER — Other Ambulatory Visit: Payer: Self-pay | Admitting: Family Medicine

## 2016-08-03 NOTE — Telephone Encounter (Signed)
RF request for alprazolam. Last OV 07/04/16. Next OV 08/17/16. Last RF 02/10/16 x 5 RFS.  Please advise.

## 2016-08-06 NOTE — Telephone Encounter (Signed)
Done, faxed to pharmacy  

## 2016-08-13 ENCOUNTER — Other Ambulatory Visit: Payer: Self-pay | Admitting: Family Medicine

## 2016-08-15 ENCOUNTER — Ambulatory Visit (INDEPENDENT_AMBULATORY_CARE_PROVIDER_SITE_OTHER): Payer: Medicaid Other | Admitting: Family Medicine

## 2016-08-15 ENCOUNTER — Encounter: Payer: Self-pay | Admitting: Family Medicine

## 2016-08-15 VITALS — BP 128/84 | HR 62 | Temp 97.3°F | Resp 18 | Wt 165.8 lb

## 2016-08-15 DIAGNOSIS — E538 Deficiency of other specified B group vitamins: Secondary | ICD-10-CM

## 2016-08-15 DIAGNOSIS — F329 Major depressive disorder, single episode, unspecified: Secondary | ICD-10-CM

## 2016-08-15 DIAGNOSIS — F17200 Nicotine dependence, unspecified, uncomplicated: Secondary | ICD-10-CM

## 2016-08-15 DIAGNOSIS — F418 Other specified anxiety disorders: Secondary | ICD-10-CM

## 2016-08-15 DIAGNOSIS — F419 Anxiety disorder, unspecified: Principal | ICD-10-CM

## 2016-08-15 DIAGNOSIS — H9313 Tinnitus, bilateral: Secondary | ICD-10-CM | POA: Diagnosis not present

## 2016-08-15 DIAGNOSIS — H918X3 Other specified hearing loss, bilateral: Secondary | ICD-10-CM | POA: Diagnosis not present

## 2016-08-15 MED ORDER — CYANOCOBALAMIN 1000 MCG/ML IJ SOLN
1000.0000 ug | Freq: Once | INTRAMUSCULAR | Status: AC
  Administered 2016-08-15: 1000 ug via INTRAMUSCULAR

## 2016-08-15 NOTE — Progress Notes (Signed)
OFFICE VISIT  08/15/2016   CC:  Chief Complaint  Patient presents with  . Follow-up    B12 injection, ringing in ears   HPI:    Patient is a 65 y.o. Caucasian male who presents for f/u anxiety/depression. Last visit about 6 wks ago I increased his prozac to 84m qd.  Also rx'd chantix for smoking cessation. He is very noncommittal about whether or not the increase in prozac has helped.  He says he doesn't feel worse.  He says the chantix has helped, he is down to only a few cigarettes a day.  C/o lots of problem with hearing impairment and bad ringing in both ears.  Says going on years but worse last 6 mo or so.  Says he has never seen an audiologist.  Past Medical History:  Diagnosis Date  . Anxiety   . Anxiety and depression   . Arthritis   . COPD (chronic obstructive pulmonary disease) (HCC)    hx of treatment with prn albuterol only due to pt refusing to pay for any daily preventative meds.  Has required many courses of systemic steroids.  . Elevated PSA 2015/16   Prostate bx 12/2013: benign.  [facility] and repeat biopsy done was again BENIGN.  Likely needs re-biopsy 1 yr per urologist.  . Erectile dysfunction   . Furuncles    inner thighs; required I&D in the past  . Hearing loss    ? 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  . History of acute prostatitis   . History of hepatitis Distant past   Hep B surface antigen and antibody NEG and Hep C antibody testing neg; transaminases ok.  . History of substance abuse    cocaine and meth + IV drug use; pt claims he's been clean since 2005  . HTN (hypertension)   . Olecranon bursitis of right elbow 03/2013   Needle aspiration done in office  . Tobacco dependence    80-90 pack-yr hx  . Vitamin B12 deficiency    hx unclear but pt states he's been getting monthly vit B12 injections and they help him feel better    Past Surgical History:  Procedure Laterality Date  . PROSTATE  BIOPSY N/A 01/14/2014   Procedure: PROSTATE BIOPSY;  Surgeon: MMarissa Nestle MD;  Location: [facility];  Service: Urology;  Laterality: N/A;  Dr. JMichela Pitcherdoes not want ultrasound    Outpatient Medications Prior to Visit  Medication Sig Dispense Refill  . ADVAIR DISKUS 250-50 MCG/DOSE AEPB INHALE ONE PUFF EVERY 12 HOURS. 60 each 11  . albuterol (PROVENTIL) (2.5 MG/3ML) 0.083% nebulizer solution INHALE ONE VIAL VIA NEBULIZER EVERY SIX HOURS AS NEEDED FOR WHEEZING OR SHORTNESS OF BREATH. 75 mL 1  . ALPRAZolam (XANAX) 1 MG tablet TAKE ONE THREE TIMES DAILY AS NEEDED 90 tablet 5  . amLODipine (NORVASC) 10 MG tablet TAKE ONE TABLET BY MOUTH ONCE DAILY. 30 tablet 3  . Ascorbic Acid (VITAMIN C PO) Take 1 tablet by mouth daily.    .Marland Kitchenaspirin 325 MG EC tablet Take 325 mg by mouth daily.    .Marland KitchenbuPROPion (ZYBAN) 150 MG 12 hr tablet Take 1 tablet (150 mg total) by mouth 2 (two) times daily. 60 tablet 2  . cyanocobalamin (,VITAMIN B-12,) 1000 MCG/ML injection Inject 1,000 mcg into the muscle every 30 (thirty) days.    .Marland KitchenFLUoxetine (PROZAC) 40 MG capsule Take 1 capsule (40 mg total) by mouth daily. 30 capsule  3  . lisinopril (PRINIVIL,ZESTRIL) 40 MG tablet TAKE ONE TABLET BY MOUTH ONCE DAILY. 30 tablet 3  . nystatin cream (MYCOSTATIN) APPLY TO THE AFFECTED AREAS TWICE DAILY FOR 2 WEEKS. 30 g 0  . Omega-3 Fatty Acids (FISH OIL) 1000 MG CAPS Take 1 capsule by mouth daily.    [name] PROAIR HFA 108 (90 Base) MCG/ACT inhaler 2 PUFFS EVERY FOUR HOURS AS NEEDED FOR WHEEZING 8.5 g 6  . Respiratory Therapy Supplies (NEBULIZER COMPRESSOR) KIT 1 unit dose of albuterol via nebulizer q4h prn wheezing and shortness of breath 1 each 0  . varenicline (CHANTIX CONTINUING MONTH PAK) 1 MG tablet Take 1 tablet (1 mg total) by mouth 2 (two) times daily. 60 tablet 1  . varenicline (CHANTIX STARTING MONTH PAK) 0.5 MG X 11 & 1 MG X 42 tablet Take one 0.5 mg tablet by mouth once daily for 3 days, then increase to one 0.5 mg tablet twice daily  for 4 days, then increase to one 1 mg tablet twice daily. (Patient not taking: Reported on 08/15/2016) 53 tablet 0   No facility-administered medications prior to visit.     Allergies  Allergen Reactions  . Citalopram Other (See Comments)    Question of whether it was making him feel like his throat was "closed up".    ROS As per HPI  PE: Blood pressure 128/84, pulse 62, temperature 97.3 F (36.3 C), temperature source Temporal, resp. rate 18, weight 165 lb 12.8 oz (75.2 kg), SpO2 97 %. Gen: Alert, well appearing.  Patient is oriented to person, place, time, and situation. AFFECT: pleasant, lucid thought and speech. EARS: R EAC with minimal cerumen, TM w/out eryth or bulging.  L EAC with no cerumen.  TM without erythema or bulging.  No fluid visible in either middle ear.  LABS:    Chemistry      Component Value Date/Time   NA 141 01/05/2016 1118   K 4.1 01/05/2016 1118   CL 103 01/05/2016 1118   CO2 28 01/05/2016 1118   BUN 10 01/05/2016 1118   CREATININE 0.88 01/05/2016 1118      Component Value Date/Time   CALCIUM 9.4 01/05/2016 1118   ALKPHOS 37 (L) 01/05/2016 1118   AST 15 01/05/2016 1118   ALT 12 01/05/2016 1118   BILITOT 0.4 01/05/2016 1118       IMPRESSION AND PLAN:  1) Anxiety and depression: stable. We'll give the recent increase of prozac to 42m qd more time.  2) Tob dependence: improved on chantix.  Encouraged pt to completely quit smoking.  Finish chantix.  3) Chronic, progressive bilateral hearing impairment with tinnitus: referred to audiology today.  An After Visit Summary was printed and given to the patient.  FOLLOW UP: Return in about 4 months (around 12/13/2016) for routine chronic illness f/u (30 min).  Signed:  PCrissie Sickles MD           08/15/2016

## 2016-08-15 NOTE — Progress Notes (Signed)
Pre visit review using our clinic review tool, if applicable. No additional management support is needed unless otherwise documented below in the visit note. 

## 2016-09-15 DIAGNOSIS — N200 Calculus of kidney: Secondary | ICD-10-CM

## 2016-09-15 HISTORY — DX: Calculus of kidney: N20.0

## 2016-10-09 ENCOUNTER — Encounter: Payer: Self-pay | Admitting: Radiology

## 2016-10-09 ENCOUNTER — Telehealth: Payer: Self-pay | Admitting: Radiology

## 2016-10-09 NOTE — Telephone Encounter (Signed)
[facility] called for stat report on pt for his CT Abdomen Pelvis. Verbal results were given and results were also faxed over. PT has 76mm left ureteral calculus with mild obstructive uropathy.

## 2016-10-10 NOTE — Telephone Encounter (Signed)
This encounter was created in error - please disregard.

## 2016-10-20 ENCOUNTER — Other Ambulatory Visit: Payer: Self-pay | Admitting: Family Medicine

## 2016-10-23 ENCOUNTER — Encounter: Payer: Self-pay | Admitting: Family Medicine

## 2016-10-23 ENCOUNTER — Telehealth: Payer: Self-pay | Admitting: Family Medicine
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
## 2016-10-23 NOTE — Telephone Encounter (Signed)
Pls find out which hospital he went to b/c we'll have to request records. I suspect that they won't send them just based on his request.-thx

## 2016-10-24 NOTE — Telephone Encounter (Signed)
Patient notified and will call back to schedule appointment. 

## 2016-10-24 NOTE — Telephone Encounter (Signed)
Records reviewed.   OK for pt to make appt (30 min) any time for hospital f/u.-thx

## 2016-10-26 ENCOUNTER — Encounter: Payer: Self-pay | Admitting: Family Medicine

## 2016-10-30 ENCOUNTER — Encounter: Payer: Self-pay | Admitting: Family Medicine

## 2016-10-30 ENCOUNTER — Ambulatory Visit (INDEPENDENT_AMBULATORY_CARE_PROVIDER_SITE_OTHER): Payer: Medicaid Other | Admitting: Family Medicine

## 2016-10-30 VITALS — BP 104/74 | HR 67 | Temp 98.0°F | Resp 16 | Ht 68.75 in | Wt 167.5 lb

## 2016-10-30 DIAGNOSIS — E538 Deficiency of other specified B group vitamins: Secondary | ICD-10-CM | POA: Diagnosis not present

## 2016-10-30 DIAGNOSIS — F1910 Other psychoactive substance abuse, uncomplicated: Secondary | ICD-10-CM | POA: Diagnosis not present

## 2016-10-30 DIAGNOSIS — I10 Essential (primary) hypertension: Secondary | ICD-10-CM | POA: Diagnosis not present

## 2016-10-30 DIAGNOSIS — J438 Other emphysema: Secondary | ICD-10-CM | POA: Diagnosis not present

## 2016-10-30 DIAGNOSIS — F411 Generalized anxiety disorder: Secondary | ICD-10-CM | POA: Diagnosis not present

## 2016-10-30 MED ORDER — ALBUTEROL SULFATE (2.5 MG/3ML) 0.083% IN NEBU: INHALATION_SOLUTION | RESPIRATORY_TRACT | 1 refills | Status: DC

## 2016-10-30 MED ORDER — CYANOCOBALAMIN 1000 MCG/ML IJ SOLN
1000.0000 ug | Freq: Once | INTRAMUSCULAR | Status: AC
  Administered 2016-10-30: 1000 ug via INTRAMUSCULAR

## 2016-10-30 NOTE — Addendum Note (Signed)
Addended by: Onalee Hua on: 10/30/2016 10:41 AM   Modules accepted: Orders

## 2016-10-30 NOTE — Progress Notes (Signed)
Pre visit review using our clinic review tool, if applicable. No additional management support is needed unless otherwise documented below in the visit note. 
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
## 2016-10-30 NOTE — Progress Notes (Signed)
A user error has taken place: encounter opened in error, closed for administrative reasons.

## 2016-10-31 ENCOUNTER — Ambulatory Visit: Payer: Medicaid Other | Admitting: Audiology

## 2016-11-29 ENCOUNTER — Other Ambulatory Visit: Payer: Self-pay | Admitting: Family Medicine

## 2016-11-29 NOTE — Telephone Encounter (Signed)
[place].  RF request for amlodipine LOV: 10/30/16 Next ov: None Last written: 08/13/16 #30 w/ 3Rf  RF request for lisinopril Last written: 08/13/16 #30 w/ 3RF

## 2016-12-05 ENCOUNTER — Telehealth: Payer: Self-pay | Admitting: Family Medicine

## 2016-12-05 NOTE — Telephone Encounter (Signed)
Patient is calling to request refills for his COPD medications, He says he does not know what they are called and would like a call back.  He does not sound too good.   Thank you,  -LL

## 2016-12-05 NOTE — Telephone Encounter (Signed)
SW pt and he stated that he did not need refills on his breathing medication. He stated that he has been coughing up "pus" for 2 days and can't breath. I tried to get pt to schedule an apt with Dr. Raoul Pitch today since Dr. Anitra Lauth is out of the office and pt declined. He stated that he lives in [place], [place] and does not have a way to get here. I advised pt that he will need to be seen by a doctor in order to be treated and that he should go to an urgent care or ER in [place], [place]. Pt voiced understanding and stated that he would see if someone could take him today or tomorrow.

## 2016-12-12 ENCOUNTER — Encounter (HOSPITAL_COMMUNITY): Payer: Self-pay | Admitting: Emergency Medicine

## 2016-12-12 ENCOUNTER — Telehealth: Payer: Self-pay | Admitting: Family Medicine

## 2016-12-12 ENCOUNTER — Inpatient Hospital Stay (HOSPITAL_COMMUNITY)
Admission: EM | Admit: 2016-12-12 | Discharge: 2016-12-14 | DRG: 641 | Payer: Medicare Other | Attending: Family Medicine | Admitting: Family Medicine

## 2016-12-12 DIAGNOSIS — E871 Hypo-osmolality and hyponatremia: Principal | ICD-10-CM | POA: Diagnosis present

## 2016-12-12 DIAGNOSIS — I10 Essential (primary) hypertension: Secondary | ICD-10-CM | POA: Diagnosis present

## 2016-12-12 DIAGNOSIS — J449 Chronic obstructive pulmonary disease, unspecified: Secondary | ICD-10-CM | POA: Diagnosis not present

## 2016-12-12 DIAGNOSIS — F1010 Alcohol abuse, uncomplicated: Secondary | ICD-10-CM

## 2016-12-12 DIAGNOSIS — R296 Repeated falls: Secondary | ICD-10-CM | POA: Diagnosis present

## 2016-12-12 DIAGNOSIS — F10129 Alcohol abuse with intoxication, unspecified: Secondary | ICD-10-CM | POA: Diagnosis not present

## 2016-12-12 DIAGNOSIS — R55 Syncope and collapse: Secondary | ICD-10-CM

## 2016-12-12 DIAGNOSIS — Z9114 Patient's other noncompliance with medication regimen: Secondary | ICD-10-CM

## 2016-12-12 DIAGNOSIS — Z888 Allergy status to other drugs, medicaments and biological substances status: Secondary | ICD-10-CM

## 2016-12-12 DIAGNOSIS — R569 Unspecified convulsions: Secondary | ICD-10-CM

## 2016-12-12 DIAGNOSIS — Z79899 Other long term (current) drug therapy: Secondary | ICD-10-CM

## 2016-12-12 DIAGNOSIS — E861 Hypovolemia: Secondary | ICD-10-CM | POA: Diagnosis present

## 2016-12-12 DIAGNOSIS — F17210 Nicotine dependence, cigarettes, uncomplicated: Secondary | ICD-10-CM | POA: Diagnosis present

## 2016-12-12 DIAGNOSIS — Z7951 Long term (current) use of inhaled steroids: Secondary | ICD-10-CM

## 2016-12-12 DIAGNOSIS — Z7982 Long term (current) use of aspirin: Secondary | ICD-10-CM

## 2016-12-12 LAB — BASIC METABOLIC PANEL
Anion gap: 10 (ref 5–15)
BUN: 8 mg/dL (ref 6–20)
CHLORIDE: 92 mmol/L — AB (ref 101–111)
CO2: 24 mmol/L (ref 22–32)
CREATININE: 0.79 mg/dL (ref 0.61–1.24)
Calcium: 8.2 mg/dL — ABNORMAL LOW (ref 8.9–10.3)
GFR calc Af Amer: >60 mL/min (ref >60)
GFR calc non Af Amer: >60 mL/min (ref >60)
GLUCOSE: 92 mg/dL (ref 65–99)
POTASSIUM: 4.4 mmol/L (ref 3.5–5.1)
Sodium: 126 mmol/L — ABNORMAL LOW (ref 135–145)

## 2016-12-12 LAB — COMPREHENSIVE METABOLIC PANEL
ALK PHOS: 45 U/L (ref 38–126)
ALT: 29 U/L (ref 17–63)
ANION GAP: 15 (ref 5–15)
AST: 41 U/L (ref 15–41)
Albumin: 4.1 g/dL (ref 3.5–5.0)
BILIRUBIN TOTAL: 0.7 mg/dL (ref 0.3–1.2)
BUN: 6 mg/dL (ref 6–20)
CALCIUM: 8.6 mg/dL — AB (ref 8.9–10.3)
CO2: 22 mmol/L (ref 22–32)
Chloride: 89 mmol/L — ABNORMAL LOW (ref 101–111)
Creatinine, Ser: 0.71 mg/dL (ref 0.61–1.24)
GLUCOSE: 75 mg/dL (ref 65–99)
POTASSIUM: 4.3 mmol/L (ref 3.5–5.1)
Sodium: 126 mmol/L — ABNORMAL LOW (ref 135–145)
TOTAL PROTEIN: 7.4 g/dL (ref 6.5–8.1)

## 2016-12-12 LAB — CBC
HCT: 38 % — ABNORMAL LOW (ref 39.0–52.0)
HEMOGLOBIN: 13.2 g/dL (ref 13.0–17.0)
MCH: 32.3 pg (ref 26.0–34.0)
MCHC: 34.7 g/dL (ref 30.0–36.0)
MCV: 92.9 fL (ref 78.0–100.0)
PLATELETS: 291 10*3/uL (ref 150–400)
RBC: 4.09 MIL/uL — AB (ref 4.22–5.81)
RDW: 13.5 % (ref 11.5–15.5)
WBC: 8.2 10*3/uL (ref 4.0–10.5)

## 2016-12-12 LAB — RAPID URINE DRUG SCREEN, HOSP PERFORMED
AMPHETAMINES: NOT DETECTED
Barbiturates: NOT DETECTED
Benzodiazepines: POSITIVE — AB
COCAINE: NOT DETECTED
OPIATES: NOT DETECTED
TETRAHYDROCANNABINOL: NOT DETECTED

## 2016-12-12 LAB — ETHANOL: ALCOHOL ETHYL (B): 92 mg/dL — AB (ref <5)

## 2016-12-12 MED ORDER — FLUOXETINE HCL 40 MG PO CAPS: 40.0000 mg | ORAL_CAPSULE | Freq: Every day | ORAL | Status: DC

## 2016-12-12 MED ORDER — SODIUM CHLORIDE 0.9 % IV SOLN: INTRAVENOUS | Status: DC

## 2016-12-12 MED ORDER — SODIUM CHLORIDE 0.9 % IV SOLN: 250.0000 mL | INTRAVENOUS | Status: DC | PRN

## 2016-12-12 MED ORDER — SODIUM CHLORIDE 0.9 % IV BOLUS (SEPSIS)
500.0000 mL | Freq: Once | INTRAVENOUS | Status: AC
  Administered 2016-12-12: 500 mL via INTRAVENOUS

## 2016-12-12 MED ORDER — SODIUM CHLORIDE 0.9% FLUSH
3.0000 mL | Freq: Two times a day (BID) | INTRAVENOUS | Status: DC
  Administered 2016-12-13 – 2016-12-14 (×2): 3 mL via INTRAVENOUS

## 2016-12-12 MED ORDER — SODIUM CHLORIDE 0.9 % IV SOLN
INTRAVENOUS | Status: DC
  Administered 2016-12-13 – 2016-12-14 (×6): via INTRAVENOUS

## 2016-12-12 MED ORDER — FLUOXETINE HCL 20 MG PO CAPS: 40.0000 mg | ORAL_CAPSULE | Freq: Every day | ORAL | Status: DC

## 2016-12-12 MED ORDER — FOLIC ACID 1 MG PO TABS
1.0000 mg | ORAL_TABLET | Freq: Every day | ORAL | Status: DC
  Administered 2016-12-13 – 2016-12-14 (×2): 1 mg via ORAL
  Filled 2016-12-12 (×3): qty 1

## 2016-12-12 MED ORDER — ADULT MULTIVITAMIN W/MINERALS CH
1.0000 | ORAL_TABLET | Freq: Every day | ORAL | Status: DC
  Administered 2016-12-13 – 2016-12-14 (×2): 1 via ORAL
  Filled 2016-12-12 (×3): qty 1

## 2016-12-12 MED ORDER — VITAMIN B-1 100 MG PO TABS
100.0000 mg | ORAL_TABLET | Freq: Every day | ORAL | Status: DC
  Administered 2016-12-13 – 2016-12-14 (×2): 100 mg via ORAL
  Filled 2016-12-12 (×3): qty 1

## 2016-12-12 MED ORDER — ALBUTEROL SULFATE (2.5 MG/3ML) 0.083% IN NEBU
2.5000 mg | INHALATION_SOLUTION | Freq: Four times a day (QID) | RESPIRATORY_TRACT | Status: DC
  Administered 2016-12-12 – 2016-12-14 (×7): 2.5 mg via RESPIRATORY_TRACT
  Filled 2016-12-12 (×7): qty 3

## 2016-12-12 MED ORDER — LORAZEPAM 1 MG PO TABS
0.0000 mg | ORAL_TABLET | Freq: Four times a day (QID) | ORAL | Status: DC
  Administered 2016-12-12: 1 mg via ORAL
  Filled 2016-12-12: qty 1

## 2016-12-12 MED ORDER — LORAZEPAM 1 MG PO TABS
1.0000 mg | ORAL_TABLET | Freq: Four times a day (QID) | ORAL | Status: DC | PRN
  Administered 2016-12-13 – 2016-12-14 (×3): 1 mg via ORAL
  Filled 2016-12-12 (×3): qty 1

## 2016-12-12 MED ORDER — MOMETASONE FURO-FORMOTEROL FUM 200-5 MCG/ACT IN AERO
INHALATION_SPRAY | RESPIRATORY_TRACT | Status: AC
  Filled 2016-12-12: qty 8.8

## 2016-12-12 MED ORDER — LORAZEPAM 1 MG PO TABS: 0.0000 mg | ORAL_TABLET | Freq: Two times a day (BID) | ORAL | Status: DC

## 2016-12-12 MED ORDER — ASPIRIN EC 325 MG PO TBEC
325.0000 mg | DELAYED_RELEASE_TABLET | Freq: Every day | ORAL | Status: DC
  Administered 2016-12-13 – 2016-12-14 (×2): 325 mg via ORAL
  Filled 2016-12-12 (×3): qty 1

## 2016-12-12 MED ORDER — FLUOXETINE HCL 20 MG PO CAPS
40.0000 mg | ORAL_CAPSULE | Freq: Every day | ORAL | Status: DC
  Administered 2016-12-13: 40 mg via ORAL
  Filled 2016-12-12: qty 2

## 2016-12-12 MED ORDER — SODIUM CHLORIDE 0.9% FLUSH: 3.0000 mL | INTRAVENOUS | Status: DC | PRN

## 2016-12-12 MED ORDER — LORAZEPAM 1 MG PO TABS
0.0000 mg | ORAL_TABLET | Freq: Four times a day (QID) | ORAL | Status: DC
Start: 2016-12-12 — End: 2016-12-14
  Administered 2016-12-12: 1 mg via ORAL
  Administered 2016-12-13 (×3): 2 mg via ORAL
  Administered 2016-12-14 (×2): 1 mg via ORAL
  Filled 2016-12-12 (×3): qty 2
  Filled 2016-12-12: qty 1
  Filled 2016-12-12 (×2): qty 2

## 2016-12-12 MED ORDER — MOMETASONE FURO-FORMOTEROL FUM 200-5 MCG/ACT IN AERO
2.0000 | INHALATION_SPRAY | Freq: Two times a day (BID) | RESPIRATORY_TRACT | Status: DC
  Administered 2016-12-12 – 2016-12-14 (×4): 2 via RESPIRATORY_TRACT
  Filled 2016-12-12: qty 8.8

## 2016-12-12 MED ORDER — LORAZEPAM 2 MG/ML IJ SOLN
1.0000 mg | Freq: Four times a day (QID) | INTRAMUSCULAR | Status: DC | PRN
  Administered 2016-12-13: 1 mg via INTRAVENOUS
  Filled 2016-12-12: qty 1

## 2016-12-12 MED ORDER — THIAMINE HCL 100 MG/ML IJ SOLN: 100.0000 mg | Freq: Every day | INTRAMUSCULAR | Status: DC

## 2016-12-12 NOTE — Telephone Encounter (Signed)
SW Dr. Anitra Lauth who recommended that pt have someone take him to [facility] or have EMS come get him. Pt was advised and voiced understanding. Pt stated that he has a friend coming to get him to take him to [facility].

## 2016-12-12 NOTE — ED Triage Notes (Signed)
Pt reports he drinks alcohol constantly and has been drinking since yesterday.  Last alcohol 4 hours ago.  Also using cocaine.  Last use about 4 months ago.

## 2016-12-12 NOTE — ED Notes (Signed)
Pt denies suicidal or homicidal ideation.

## 2016-12-12 NOTE — ED Notes (Addendum)
Pt aware of protocol. Seizure pads placed. Pt on cardiac monitor. Pt last used cocaine x 1 month ago
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
## 2016-12-12 NOTE — Telephone Encounter (Signed)
Patient called expressing that he needs to go to detox and needs to speak to nurse. I transferred call to [facility] to [place], [place].

## 2016-12-12 NOTE — ED Notes (Signed)
Floor unable to take report at this time.

## 2016-12-13 DIAGNOSIS — F1010 Alcohol abuse, uncomplicated: Secondary | ICD-10-CM | POA: Diagnosis present

## 2016-12-13 DIAGNOSIS — Z888 Allergy status to other drugs, medicaments and biological substances status: Secondary | ICD-10-CM | POA: Diagnosis not present

## 2016-12-13 DIAGNOSIS — R296 Repeated falls: Secondary | ICD-10-CM | POA: Diagnosis present

## 2016-12-13 DIAGNOSIS — R55 Syncope and collapse: Secondary | ICD-10-CM | POA: Diagnosis not present

## 2016-12-13 DIAGNOSIS — E861 Hypovolemia: Secondary | ICD-10-CM | POA: Diagnosis present

## 2016-12-13 DIAGNOSIS — E871 Hypo-osmolality and hyponatremia: Secondary | ICD-10-CM | POA: Diagnosis present

## 2016-12-13 DIAGNOSIS — J449 Chronic obstructive pulmonary disease, unspecified: Secondary | ICD-10-CM | POA: Diagnosis present

## 2016-12-13 DIAGNOSIS — Z9114 Patient's other noncompliance with medication regimen: Secondary | ICD-10-CM | POA: Diagnosis not present

## 2016-12-13 DIAGNOSIS — F10129 Alcohol abuse with intoxication, unspecified: Secondary | ICD-10-CM | POA: Diagnosis present

## 2016-12-13 DIAGNOSIS — I10 Essential (primary) hypertension: Secondary | ICD-10-CM | POA: Diagnosis present

## 2016-12-13 DIAGNOSIS — Z7951 Long term (current) use of inhaled steroids: Secondary | ICD-10-CM | POA: Diagnosis not present

## 2016-12-13 DIAGNOSIS — Z7982 Long term (current) use of aspirin: Secondary | ICD-10-CM | POA: Diagnosis not present

## 2016-12-13 DIAGNOSIS — F17210 Nicotine dependence, cigarettes, uncomplicated: Secondary | ICD-10-CM | POA: Diagnosis present

## 2016-12-13 DIAGNOSIS — Z79899 Other long term (current) drug therapy: Secondary | ICD-10-CM | POA: Diagnosis not present

## 2016-12-13 LAB — BASIC METABOLIC PANEL
ANION GAP: 11 (ref 5–15)
Anion gap: 14 (ref 5–15)
BUN: 7 mg/dL (ref 6–20)
BUN: 8 mg/dL (ref 6–20)
CHLORIDE: 94 mmol/L — AB (ref 101–111)
CHLORIDE: 95 mmol/L — AB (ref 101–111)
CO2: 23 mmol/L (ref 22–32)
CO2: 25 mmol/L (ref 22–32)
CREATININE: 0.78 mg/dL (ref 0.61–1.24)
Calcium: 8.2 mg/dL — ABNORMAL LOW (ref 8.9–10.3)
Calcium: 8.4 mg/dL — ABNORMAL LOW (ref 8.9–10.3)
Creatinine, Ser: 0.69 mg/dL (ref 0.61–1.24)
GFR calc Af Amer: >60 mL/min (ref >60)
GFR calc non Af Amer: >60 mL/min (ref >60)
GLUCOSE: 67 mg/dL (ref 65–99)
Glucose, Bld: 72 mg/dL (ref 65–99)
POTASSIUM: 3.9 mmol/L (ref 3.5–5.1)
POTASSIUM: 4.2 mmol/L (ref 3.5–5.1)
SODIUM: 131 mmol/L — AB (ref 135–145)
Sodium: 131 mmol/L — ABNORMAL LOW (ref 135–145)

## 2016-12-13 NOTE — Care Management (Signed)
    Durable Medical Equipment        Start     Ordered   12/13/16 [phone]  For home use only DME Walker rolling  Once    Question:  Patient needs a walker to treat with the following condition  Answer:  Arthritis   12/13/16 0946

## 2016-12-13 NOTE — Evaluation (Signed)
Physical Therapy Evaluation Patient Details Name: Jacob Frank MRN: [id number] DOB: October 05, 1950 Today's Date: 12/13/2016   History of Present Illness  66 year old man with alcohol abuse presented to the emergency department on the advice of his physician for detox. Patient reported multiple seizures at home to the emergency department physician. Workup revealed hyponatremia and elevated alcohol level. Referral to observation for further evaluation and treatment of hyponatremia.  Clinical Impression  Patient received supine in bed, pleasant and willing to participate in skilled PT services. Able to complete all bed mobility with independence however due to unsteadiness requires min guard and rolling walker for safety with functional transfers and gait, however able to complete all tasks with this assistive device. Patient states that he is homeless with no friends/family in area to assist, and it is indicated in his chart that he has Medicare; however CSW indicates that due to insurance limitations it is possible that patient may not qualify for SNF placement. At this time DPT does recommend that SNF placement would be ideal but at bare minimum he will need rolling walker for mobility. Will plan to work with patient daily during this admission with focus on balance, gait, and functional strength. Patient left supine in bed with all needs met, mobility sheet placed in room, and bed alarm activated.     Follow Up Recommendations SNF;Other (comment) (ideally patient would receive SNF but may be ineligible due to insurance limitations; at bare minimum he will need walker )    Equipment Recommendations  Rolling walker with 5" wheels    Recommendations for Other Services       Precautions / Restrictions Precautions Precautions: Fall Restrictions Weight Bearing Restrictions: No      Mobility  Bed Mobility Overal bed mobility: Modified Independent                Transfers Overall transfer  level: Needs assistance Equipment used: Rolling walker (2 wheeled) Transfers: Sit to/from Stand Sit to Stand: Min guard            Ambulation/Gait Ambulation/Gait assistance: Min guard Ambulation Distance (Feet): 20 Feet Assistive device: Rolling walker (2 wheeled) Gait Pattern/deviations: Step-to pattern;Decreased dorsiflexion - right;Decreased dorsiflexion - left;Scissoring;Drifts right/left;Trunk flexed;Narrow base of support   Gait velocity interpretation: <1.8 ft/sec, indicative of risk for recurrent falls    Stairs            Wheelchair Mobility    Modified Rankin (Stroke Patients Only)       Balance Overall balance assessment: History of Falls;Needs assistance Sitting-balance support: No upper extremity supported Sitting balance-Leahy Scale: Good     Standing balance support: Bilateral upper extremity supported Standing balance-Leahy Scale: Fair Standing balance comment: standing balance with no UE support POOR                              Pertinent Vitals/Pain Pain Assessment: No/denies pain ("just dizzy" )    Home Living Family/patient expects to be discharged to:: Shelter/Homeless                 Additional Comments: homeless with no friends/family to assist     Prior Function Level of Independence: Independent with assistive device(s)         Comments: frequent LOB and falls      Hand Dominance        Extremity/Trunk Assessment        Lower Extremity Assessment Lower Extremity Assessment: Generalized weakness  Cervical / Trunk Assessment Cervical / Trunk Assessment: Normal  Communication   Communication: No difficulties  Cognition Arousal/Alertness: Awake/alert Behavior During Therapy: WFL for tasks assessed/performed Overall Cognitive Status: Within Functional Limits for tasks assessed                      General Comments      Exercises     Assessment/Plan    PT Assessment Patient  needs continued PT services  PT Problem List Decreased strength;Decreased coordination;Decreased activity tolerance;Decreased balance       PT Treatment Interventions DME instruction;Therapeutic activities;Gait training;Therapeutic exercise;Patient/family education;Stair training;Balance training;Functional mobility training;Neuromuscular re-education    PT Goals (Current goals can be found in the Care Plan section)  Acute Rehab PT Goals Patient Stated Goal: no more falls, return to baseline  PT Goal Formulation: With patient Time For Goal Achievement: 12/27/16 Potential to Achieve Goals: Fair    Frequency Min 5X/week   Barriers to discharge        Co-evaluation               End of Session Equipment Utilized During Treatment: Gait belt Activity Tolerance: Patient tolerated treatment well Patient left: in bed;with call bell/phone within reach;with bed alarm set Nurse Communication: Mobility status;Other (comment) (patient is homeless/on observation status on Medicare; per social work staff, this will prevent placement in short term rehab ) PT Visit Diagnosis: Unsteadiness on feet (R26.81);Other abnormalities of gait and mobility (R26.89);Repeated falls (R29.6);Muscle weakness (generalized) (M62.81);History of falling (Z91.81);Difficulty in walking, not elsewhere classified (R26.2);Dizziness and giddiness (R42)    Functional Assessment Tool Used: AM-PAC 6 Clicks Basic Mobility;Clinical judgement Functional Limitation: Mobility: Walking and moving around Mobility: Walking and Moving Around Current Status (S3159): At least 40 percent but less than 60 percent impaired, limited or restricted Mobility: Walking and Moving Around Goal Status [phone]): At least 20 percent but less than 40 percent impaired, limited or restricted    Time: 0845-0900 PT Time Calculation (min) (ACUTE ONLY): 15 min   Charges:   PT Evaluation $PT Eval Moderate Complexity: 1 Procedure     PT G Codes:    PT G-Codes **NOT FOR INPATIENT CLASS** Functional Assessment Tool Used: AM-PAC 6 Clicks Basic Mobility;Clinical judgement Functional Limitation: Mobility: Walking and moving around Mobility: Walking and Moving Around Current Status (Y9244): At least 40 percent but less than 60 percent impaired, limited or restricted Mobility: Walking and Moving Around Goal Status [phone]): At least 20 percent but less than 40 percent impaired, limited or restricted     Deniece Ree PT, DPT [phone]

## 2016-12-13 NOTE — Clinical Social Work Note (Signed)
Pt now interested in detox. CSW called multiple local facilities and none accept Medicare/Medicaid for detox. Pt and MD notified.   Benay Pike, [place]

## 2016-12-13 NOTE — Care Management Note (Signed)
Case Management Note  Patient Details  Name: Jacob Frank MRN: [id number] Date of Birth: 05-28-1951  Subjective/Objective:                  Pt admitted with hyponatremia. He lives in homeless shelter in [place]. He is ind with ADL's. He abuses ETOH. He has PCP and transportation to appointments, he has insurance with drug coverage and reports no difficulty affording medications. PT has recommended RW and SNF. PT will not have 3 day inpt stay and understands medicare will not pay for SNF. He was given option of placement under his medicaid but does not want to give up check. HH will not come to shelter and pt understands. He was ordered RW and pt has chosen [facility] from list of DME providers.   Action/Plan: Jacob Frank, of [facility], aware of referral and will obtain pt info from chart. He plans to return to homeless shelter tomorrow.   Expected Discharge Date:    12/14/2016              Expected Discharge Plan:  Homeless Shelter  In-House Referral:  NA  Discharge planning Services  CM Consult  Post Acute Care Choice:  Durable Medical Equipment Choice offered to:  Patient  DME Arranged:  Walker rolling DME Agency:  [facility].  Status of Service:  Completed, signed off  Sherald Barge, RN 12/13/2016, 3:01 PM

## 2016-12-13 NOTE — Clinical Social Work Note (Signed)
See note by Ambrose Pancoast, LCSW regarding substance use and discussion of rehab. This CSW followed up with pt regarding decision on placement and he states that he does not want to go to rehab. Pt plans to stay at the [facility] shelter until it closes in April and then will consider his options. He states he has a case Freight forwarder working on low income housing. CM notified.  Benay Pike, [place]

## 2016-12-13 NOTE — Care Management Obs Status (Signed)
[facility] NOTIFICATION   Patient Details  Name: Jacob Frank MRN: [id number] Date of Birth: 10/28/1950   Medicare Observation Status Notification Given:  Yes    Sherald Barge, RN 12/13/2016, 12:29 PM

## 2016-12-13 NOTE — Progress Notes (Signed)
PROGRESS NOTE  NHAT HEARNE EQA:[id number] DOB: 03-20-1951 DOA: 12/12/2016 PCP: [name], MD  Brief Narrative: 66 year old man with alcohol abuse presented to the emergency department on the advice of his physician for detox. Patient reported multiple seizures at home to the emergency department physician. Workup revealed hyponatremia and elevated alcohol level. Referral to observation for further evaluation and treatment of hyponatremia.  Assessment/Plan 1. Hypovolemic hyponatremia, secondary to potomania, poor solute intake. Improving with IV fluids as anticipated.  Appears asymptomatic. 2. Syncope with multiple falls at home, secondary to alcohol abuse, complicated by Xanax use. Doubt hyponatremia was contributing. Telemetry sinus tachycardia. EKG reassuring. No evidence of seizure activity. On close questioning on admission the patient denied seizures but did report tremors and passing out. 3. Alcohol intoxication, abuse. No evidence of withdrawal.   Physical therapy recommended skilled nursing facility placement, at a minimum recommended rolling walker at home.  Unfortunately no detox facility accepts patient's insurance.  Patient does not want to go to SNF.  Continue IVF, check BMP in AM and would anticipate d/c if stable.  DVT prophylaxis: SCDs Code Status: full code Family Communication: none Disposition Plan: home    Murray Hodgkins, MD  [facility] Direct contact: [phone] --Via [place]  --[email or web address]; password [email or web address]  7PM-7AM contact night coverage as above 12/13/2016, 3:15 PM  LOS: 0 days   Consultants:    Procedures:    Antimicrobials:    Interval history/Subjective: Overall feels better. Does have some tremors. Ambulating okay. No syncope. No seizures.  Objective: Vitals:   12/13/16 1100 12/13/16 1152 12/13/16 1200 12/13/16 1420  BP: 122/87 139/83 129/88   Pulse: 90 90 97   Resp:   18   Temp:   99.5 F (37.5 C)   TempSrc:    Oral   SpO2:   94% 95%  Weight:      Height:        Intake/Output Summary (Last 24 hours) at 12/13/16 1515 Last data filed at 12/13/16 1324  Gross per 24 hour  Intake           2437.5 ml  Output             2150 ml  Net            287.5 ml     Filed Weights   12/12/16 1128 12/12/16 1646  Weight: 74.8 kg (165 lb) 72.3 kg (159 lb 4.8 oz)    Exam:    Constitutional: Appears calm, comfortable. Speech fluent and clear.   Cardiovascular: Regular rate and rhythm. No murmur, rub or gallop. No lower extremity edema.  Respiratory clear to auscultation bilaterally. No wheezes, rales or rhonchi. Normal respiratory effort.   Psychiatric. Alert, oriented, speech clear and appropriate. Oriented to self, month, year, hospital.   I have personally reviewed following labs and imaging studies:  Sodium 131, potassium normal, remainder BMP unremarkable. Urine drug screen positive for benzodiazepines.  Scheduled Meds: . albuterol  2.5 mg Nebulization Q6H  . aspirin  325 mg Oral Daily  . FLUoxetine  40 mg Oral Daily  . folic acid  1 mg Oral Daily  . LORazepam  0-4 mg Oral Q6H   Followed by  . [START ON 12/14/2016] LORazepam  0-4 mg Oral Q12H  . mometasone-formoterol  2 puff Inhalation BID  . multivitamin with minerals  1 tablet Oral Daily  . sodium chloride flush  3 mL Intravenous Q12H  . sodium chloride flush  3 mL Intravenous Q12H  .  thiamine  100 mg Oral Daily   Or  . thiamine  100 mg Intravenous Daily   Continuous Infusions: . sodium chloride 150 mL/hr at 12/13/16 1022    Principal Problem:   Hyponatremia Active Problems:   COPD (chronic obstructive pulmonary disease) (HCC)   Alcohol abuse with intoxication ([place])   Syncope   LOS: 0 days

## 2016-12-13 NOTE — Clinical Social Work Note (Signed)
Clinical Social Work Assessment  Patient Details  Name: Jacob Frank MRN: [id number] Date of Birth: June 30, 1951  Date of referral:  12/13/16               Reason for consult:  Discharge Planning                Permission sought to share information with:    Permission granted to share information::     Name::        Agency::     Relationship::     Contact Information:     Housing/Transportation Living arrangements for the past 2 months:  [place] of Information:  Patient Patient Interpreter Needed:  None Criminal Activity/Legal Involvement Pertinent to Current Situation/Hospitalization:  No - Comment as needed Significant Relationships:  None Lives with:  Other (Comment) ([place]) Do you feel safe going back to the place where you live?  Yes Need for family participation in patient care:  Yes (Comment)  Care giving concerns:  None identified.    Social Worker assessment / plan:  Patient lives in a homeles shelter. He ambulates with a cane. He has no family support. He stated that he drinks alcohol almost daily. Patient stated that he drinks alcohol almost daily. Patient stated that "might decline." LCSW advised patient that she would contact him again today for a decision. LCSW left patient a list of facilities. Patient did not participate in SBIRT.   Employment status:  Retired Forensic scientist:  Medicare PT Recommendations:  [place] / Referral to community resources:  [place]  Patient/Family's Response to care:  Patient is unsure as to whether he desires placement.   Patient/Family's Understanding of and Emotional Response to Diagnosis, Current Treatment, and Prognosis: Patient understands his diagnosis, treatment and prognosis.   Emotional Assessment Appearance:  Appears stated age Attitude/Demeanor/Rapport:   (Cooperative) Affect (typically observed):  Accepting Orientation:  Oriented to Place,  Oriented to Self, Oriented to  Time, Oriented to Situation Alcohol / Substance use:  Alcohol Use Psych involvement (Current and /or in the community):  No (Comment)  Discharge Needs  Concerns to be addressed:  Discharge Planning Concerns Readmission within the last 30 days:  No Current discharge risk:  None Barriers to Discharge:  No Barriers Identified   Ihor Gully, LCSW 12/13/2016, 2:46 PM

## 2016-12-14 LAB — BASIC METABOLIC PANEL
ANION GAP: 8 (ref 5–15)
BUN: 6 mg/dL (ref 6–20)
CALCIUM: 7.9 mg/dL — AB (ref 8.9–10.3)
CO2: 27 mmol/L (ref 22–32)
CREATININE: 0.67 mg/dL (ref 0.61–1.24)
Chloride: 98 mmol/L — ABNORMAL LOW (ref 101–111)
GFR calc Af Amer: >60 mL/min (ref >60)
Glucose, Bld: 105 mg/dL — ABNORMAL HIGH (ref 65–99)
Potassium: 3.4 mmol/L — ABNORMAL LOW (ref 3.5–5.1)
Sodium: 133 mmol/L — ABNORMAL LOW (ref 135–145)

## 2016-12-14 LAB — HIV ANTIBODY (ROUTINE TESTING W REFLEX): HIV SCREEN 4TH GENERATION: NONREACTIVE

## 2016-12-14 MED ORDER — POTASSIUM CHLORIDE CRYS ER 20 MEQ PO TBCR
40.0000 meq | EXTENDED_RELEASE_TABLET | Freq: Once | ORAL | Status: AC
  Administered 2016-12-14: 40 meq via ORAL
  Filled 2016-12-14: qty 2

## 2016-12-14 MED ORDER — ADULT MULTIVITAMIN W/MINERALS CH: 1.0000 | ORAL_TABLET | Freq: Every day | ORAL | Status: DC

## 2016-12-14 MED ORDER — FOLIC ACID 1 MG PO TABS: 1.0000 mg | ORAL_TABLET | Freq: Every day | ORAL | 0 refills | Status: DC

## 2016-12-14 MED ORDER — THIAMINE HCL 100 MG PO TABS
100.0000 mg | ORAL_TABLET | Freq: Every day | ORAL | Status: DC
Start: 2016-12-15 — End: 2016-12-24

## 2016-12-14 NOTE — Progress Notes (Signed)
Dr. Dionicia Abler paged to make aware of pts CIWA score of 15, given 2mg  of Ativan per CIWA protocol and 1mg  PRN. Pt requesting Xanax home dose. Dr. Dionicia Abler stated that pt will have to discuss with day doctor. Pt made aware. Very agitated.

## 2016-12-14 NOTE — Care Management (Signed)
    Durable Medical Equipment        Start     Ordered   12/14/16 1128  For home use only DME Cane  Once     12/14/16 1128   12/13/16 0946  For home use only DME Walker rolling  Once    Question:  Patient needs a walker to treat with the following condition  Answer:  Arthritis   12/13/16 0946

## 2016-12-14 NOTE — Discharge Summary (Addendum)
Physician Discharge Summary  Jacob Frank QQV:[id number] DOB: [birth year] DOA: 12/12/2016  PCP: [name], MD  Admit date: 12/12/2016 Discharge date: 12/14/2016  Recommendations for Outpatient Follow-up:   Alcohol abuse  Chronic Xanax in context of above    Follow-up Information    MCGOWEN,PHILIP H, MD. Schedule an appointment as soon as possible for a visit in 1 week(s).   Specialty:  Family Medicine Contact information: [address] [place]  [phone]          Discharge Diagnoses:  1. Hypovolemic hyponatremia 2. Syncope 3. Multiple falls at home 4. Alcohol intoxication 5. Alcohol abuse  Discharge Condition: Improved Disposition: Return to shelter  Diet recommendation: Regular  Filed Weights   12/12/16 1128 12/12/16 1646  Weight: 74.8 kg (165 lb) 72.3 kg (159 lb 4.8 oz)    History of present illness:  66 year old man with alcohol abuse presented to the emergency department on the advice of his physician for detox. Patient reported multiple seizures at home to the emergency department physician. Workup revealed hyponatremia and elevated alcohol level. Referral to observation for further evaluation and treatment of hyponatremia.  Hospital Course:  Patient was observed, treated with IV fluids with gradual improvement of sodium. No recurrent syncope. No evidence of alcohol withdrawal. He was seen by social work and offered skilled nursing facility placement but eventually declined this in preference of returning to shelter at this time. Detox facility was pursued but none accept the patient's insurance. At this point he will return to shelter. Individual issues as below. Hospitalization was uncomplicated.  1. Hypovolemic hyponatremia secondary to poor solute intake. Essentially resolved with IV fluids.  2. Syncope, multiple falls at home secondary to alcohol abuse, complicated by Xanax use. Telemetry and EKG unrevealing. No evidence of  seizure activity. On close questioning, patient denied seizures but reported tremors and passing out. History and exam at that time most suggestive of alcohol intoxication. No evidence of delirium tremors or withdrawal. 3. Alcohol intoxication, abuse. No evidence of withdrawal.   Discharge Instructions  Discharge Instructions    Activity as tolerated - No restrictions    Complete by:  As directed    Diet general    Complete by:  As directed    Discharge instructions    Complete by:  As directed    Call your physician or seek immediate medical attention for passing out, seizures, confusion or worsening of condition. Please discuss alcohol use with primary care physician, recommend cessation with your physician's supervision and care.  Please discuss Xanax use with your physician.     Allergies as of 12/14/2016      Reactions   Citalopram Other (See Comments)   Question of whether it was making him feel like his throat was "closed up".      Medication List    TAKE these medications   ADVAIR DISKUS 250-50 MCG/DOSE Aepb Generic drug:  Fluticasone-Salmeterol INHALE ONE PUFF EVERY 12 HOURS.   ALPRAZolam 1 MG tablet Commonly known as:  XANAX TAKE ONE THREE TIMES DAILY AS NEEDED   amLODipine 10 MG tablet Commonly known as:  NORVASC TAKE ONE TABLET BY MOUTH ONCE DAILY.   aspirin 325 MG EC tablet Take 325 mg by mouth daily.   cyanocobalamin 1000 MCG/ML injection Commonly known as:  (VITAMIN B-12) Inject 1,000 mcg into the muscle every 30 (thirty) days.   Fish Oil 1000 MG Caps Take 1 capsule by mouth daily.   FLUoxetine 40 MG capsule Commonly known  as:  PROZAC TAKE ONE CAPSULE BY MOUTH ONCE DAILY.   folic acid 1 MG tablet Commonly known as:  FOLVITE Take 1 tablet (1 mg total) by mouth daily. Start taking on:  12/15/2016   lisinopril 40 MG tablet Commonly known as:  PRINIVIL,ZESTRIL TAKE ONE TABLET BY MOUTH ONCE DAILY.   multivitamin with minerals Tabs tablet Take 1  tablet by mouth daily. Start taking on:  12/15/2016   Nebulizer Compressor Kit 1 unit dose of albuterol via nebulizer q4h prn wheezing and shortness of breath   nystatin cream Commonly known as:  MYCOSTATIN APPLY TO THE AFFECTED AREAS TWICE DAILY FOR 2 WEEKS.   PROAIR HFA 108 (90 Base) MCG/ACT inhaler Generic drug:  albuterol 2 PUFFS EVERY FOUR HOURS AS NEEDED FOR WHEEZING   albuterol (2.5 MG/3ML) 0.083% nebulizer solution Commonly known as:  PROVENTIL USE 1 VIAL IN NEBULIZER EVERY 6 HOURS AS NEEDED FOR WHEEZING OR SHORTNESS OF BREATH.   thiamine 100 MG tablet Take 1 tablet (100 mg total) by mouth daily. Start taking on:  12/15/2016   VITAMIN C PO Take 1 tablet by mouth daily.            Durable Medical Equipment        Start     Ordered   12/13/16 [phone]  For home use only DME Walker rolling  Once    Question:  Patient needs a walker to treat with the following condition  Answer:  Arthritis   12/13/16 0946     Allergies  Allergen Reactions  . Citalopram Other (See Comments)    Question of whether it was making him feel like his throat was "closed up".    The results of significant diagnostics from this hospitalization (including imaging, microbiology, ancillary and laboratory) are listed below for reference.      Labs: Basic Metabolic Panel:  Recent Labs Lab 12/12/16 1154 12/12/16 1911 12/13/16 0002 12/13/16 0534 12/14/16 0513  NA 126* 126* 131* 131* 133*  K 4.3 4.4 4.2 3.9 3.4*  CL 89* 92* 94* 95* 98*  CO2 '22 24 23 25 27  '$ GLUCOSE 75 92 67 72 105*  BUN '6 8 8 7 6  '$ CREATININE 0.71 0.79 0.78 0.69 0.67  CALCIUM 8.6* 8.2* 8.4* 8.2* 7.9*   Liver Function Tests:  Recent Labs Lab 12/12/16 1154  AST 41  ALT 29  ALKPHOS 45  BILITOT 0.7  PROT 7.4  ALBUMIN 4.1   CBC:  Recent Labs Lab 12/12/16 1154  WBC 8.2  HGB 13.2  HCT 38.0*  MCV 92.9  PLT 291    Principal Problem:   Hyponatremia Active Problems:   COPD (chronic obstructive pulmonary  disease) (HCC)   Alcohol abuse with intoxication ([place])   Syncope   Time coordinating discharge: 25 minutes  Signed:  Murray Hodgkins, MD [facility] 12/14/2016, 10:57 AM

## 2016-12-14 NOTE — Progress Notes (Signed)
PROGRESS NOTE  Jacob Frank [id number]:[id number] DOB: 10/26/1950 DOA: 12/12/2016 PCP: [name], MD  Brief Narrative: 66 year old man with alcohol abuse presented to the emergency department on the advice of his physician for detox. Patient reported multiple seizures at home to the emergency department physician. Workup revealed hyponatremia and elevated alcohol level. Referral to observation for further evaluation and treatment of hyponatremia.  Assessment/Plan 1. Hypovolemic hyponatremia secondary to poor solute intake. Essentially resolved with IV fluids. ACE inhibitor [place]. 2. Syncope, multiple falls at home secondary to alcohol abuse, complicated by Xanax use. Telemetry and EKG unrevealing. No evidence of seizure activity. On close questioning, patient denied seizures but reported tremors and passing out. 3. Alcohol intoxication, abuse. No evidence of withdrawal.   Patient declines placement, prefers to return to shelter. Unfortunately detox on an option secondary to insurance.   Murray Hodgkins, MD  [facility] Direct contact: [phone] --Via amion app [place]  --[email or web address]; password [email or web address]  7PM-7AM contact night coverage as above 12/14/2016, 10:52 AM  LOS: 1 day   Consultants:    Procedures:    Antimicrobials:    Interval history/Subjective: Was agitated last night. This morning feels well. Tremors better. No complaints. He plans to go back to the shelter.  Objective: Vitals:   12/13/16 2322 12/14/16 0250 12/14/16 0528 12/14/16 0734  BP: (!) 141/89  121/78   Pulse: 90  81   Resp: 16  18   Temp: 98.5 F (36.9 C)  98.4 F (36.9 C)   TempSrc: Oral  Oral   SpO2: 93% 94% 98% 95%  Weight:      Height:        Intake/Output Summary (Last 24 hours) at 12/14/16 1052 Last data filed at 12/14/16 0200  Gross per 24 hour  Intake             1795 ml  Output             1500 ml  Net              295 ml     Filed Weights   12/12/16 1128 12/12/16 1646    Weight: 74.8 kg (165 lb) 72.3 kg (159 lb 4.8 oz)    Exam: Constitutional: Appears calm, comfortable. No significant tremors. Psychiatric: Alert. Speech fluent and clear. Oriented to self, hospital, month, year. Prevascular: Regular rate and rhythm. No murmur, rub or gallop. Respiratory: Clear to auscultation bilaterally. No wheezes, rales or rhonchi. Normal respiratory effort.   I have personally reviewed following labs and imaging studies:  Sodium 133, potassium 3.4, remainder BMP unremarkable.  Scheduled Meds: . albuterol  2.5 mg Nebulization Q6H  . aspirin  325 mg Oral Daily  . FLUoxetine  40 mg Oral Daily  . folic acid  1 mg Oral Daily  . LORazepam  0-4 mg Oral Q6H   Followed by  . LORazepam  0-4 mg Oral Q12H  . mometasone-formoterol  2 puff Inhalation BID  . multivitamin with minerals  1 tablet Oral Daily  . sodium chloride flush  3 mL Intravenous Q12H  . sodium chloride flush  3 mL Intravenous Q12H  . thiamine  100 mg Oral Daily   Or  . thiamine  100 mg Intravenous Daily   Continuous Infusions: . sodium chloride 150 mL/hr at 12/14/16 0535    Principal Problem:   Hyponatremia Active Problems:   COPD (chronic obstructive pulmonary disease) (HCC)   Alcohol abuse with intoxication ([place])   Syncope   LOS: 1 day

## 2016-12-14 NOTE — Care Management (Signed)
RN reports patient no longer wants RW, but wants a cane instead. Romualdo Bolk of [facility] notified.

## 2016-12-14 NOTE — Progress Notes (Signed)
Discharge instructions given, verbalized understanding, out in stable condition via w/c with staff. 

## 2016-12-17 ENCOUNTER — Telehealth: Payer: Self-pay | Admitting: *Deleted

## 2016-12-17 NOTE — Patient Instructions (Signed)
Patient instructed to take his nebulizer treatments 2-3 times per day as ordered and to keep his scheduled appointment with DR Ernestine Conrad on 12-24-16

## 2016-12-17 NOTE — Telephone Encounter (Signed)
PLEASE NOTE: All timestamps contained within this report are represented as [place] Standard Time. CONFIDENTIALTY NOTICE: This fax transmission is intended only for the addressee. It contains information that is legally privileged, confidential or otherwise protected from use or disclosure. If you are not the intended recipient, you are strictly prohibited from reviewing, disclosing, copying using or disseminating any of this information or taking any action in reliance on or regarding this information. If you have received this fax in error, please notify us immediately by telephone so that we can arrange for its return to [place]. Phone: [phone], Toll-Free: [phone], Fax: [phone] Page: 1 of 1 Call Id: [id number] [place] Patient Name: Jacob Frank Gender: Male DOB: 02-05-1951 Age: 66 Y 61 M 9 D Return Phone Number: [phone] (Primary) City/State/Zip: [place] Client [place] Night - Client Client Site [place] Night Physician Crissie Sickles - MD Who Is Calling Patient / Member / Family / Caregiver Call Type Triage / Clinical Relationship To Patient Self Return Phone Number [phone] (Primary) Chief Complaint FAINTING or [place] Reason for Call Symptomatic / Request for [place] states he is weak and about to pass out. He is coughing, he has copd and enthozym No Triage Reason Patient declined Nurse Assessment Nurse: Julien Girt, RN, Almyra Free Date/Time Eilene Ghazi Time): 12/15/2016 8:11:19 AM Confirm and document reason for call. If symptomatic, describe symptoms. ---Caller states he is weak and about to pass out. He is coughing, he has copd and emphysema. He was seen in the ED this week, but "they did not tell him anything" but was admitted for 3 days. Adds he lives in a homeless shelter and will have to be out  this morning. He is dizzy and feels disoriented and "does not time for these questions". Caller refused triage or assistance. States "he will walk it off and call back later" Select reason for no triage. ---Patient declined Please document clinical information provided and list any resource used. ---Strongly urged caller to accept triage and all 911. He refused and disconnected. Guidelines Guideline Title Affirmed Question Disp. Time Eilene Ghazi Time) Disposition Final User 12/15/2016 8:24:48 AM Clinical Call Yes Julien Girt, RN, Almyra Free
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
## 2016-12-17 NOTE — Congregational Nurse Program (Signed)
Admitted into [facility] on last week for COPD,Seizure  Disorder and Generalized Weakness.also is a r ecovering alcoholic.stated he takes his medications as ordered except his Albuterol Nebulizer treatments. Explained the importance of doing nebs  as ordered.  Needs to get his machine from his friends house  VS B/P 116/78  P [place] [phone]

## 2016-12-17 NOTE — Telephone Encounter (Signed)
Please advise. Thanks.  

## 2016-12-17 NOTE — Telephone Encounter (Signed)
Agree with nurse's triage to ED or call 911. Pt declined and hung up phone w/out further conversation, so there is nothing further to do at this time.
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
## 2016-12-24 ENCOUNTER — Ambulatory Visit (INDEPENDENT_AMBULATORY_CARE_PROVIDER_SITE_OTHER): Payer: Medicare Other | Admitting: Family Medicine

## 2016-12-24 ENCOUNTER — Other Ambulatory Visit: Payer: Self-pay | Admitting: Family Medicine

## 2016-12-24 ENCOUNTER — Encounter: Payer: Self-pay | Admitting: Family Medicine

## 2016-12-24 VITALS — BP 115/79 | HR 85 | Temp 97.9°F | Resp 16 | Ht 68.75 in | Wt 160.5 lb

## 2016-12-24 DIAGNOSIS — F10929 Alcohol use, unspecified with intoxication, unspecified: Secondary | ICD-10-CM

## 2016-12-24 DIAGNOSIS — E538 Deficiency of other specified B group vitamins: Secondary | ICD-10-CM | POA: Diagnosis not present

## 2016-12-24 DIAGNOSIS — I10 Essential (primary) hypertension: Secondary | ICD-10-CM

## 2016-12-24 DIAGNOSIS — F411 Generalized anxiety disorder: Secondary | ICD-10-CM

## 2016-12-24 DIAGNOSIS — F1020 Alcohol dependence, uncomplicated: Secondary | ICD-10-CM | POA: Diagnosis not present

## 2016-12-24 LAB — BASIC METABOLIC PANEL
BUN: 12 mg/dL (ref 6–23)
CHLORIDE: 99 meq/L (ref 96–112)
CO2: 29 meq/L (ref 19–32)
Calcium: 9.7 mg/dL (ref 8.4–10.5)
Creatinine, Ser: 0.82 mg/dL (ref 0.40–1.50)
GFR: 100.41 mL/min (ref >60.00)
GLUCOSE: 103 mg/dL — AB (ref 70–99)
POTASSIUM: 4.9 meq/L (ref 3.5–5.1)
Sodium: 136 mEq/L (ref 135–145)

## 2016-12-24 MED ORDER — ALPRAZOLAM 1 MG PO TABS: ORAL_TABLET | ORAL | 0 refills | Status: DC

## 2016-12-24 MED ORDER — CYANOCOBALAMIN 1000 MCG/ML IJ SOLN
1000.0000 ug | Freq: Once | INTRAMUSCULAR | Status: AC
  Administered 2016-12-24: 1000 ug via INTRAMUSCULAR

## 2016-12-24 MED ORDER — DULOXETINE HCL 30 MG PO CPEP: 30.0000 mg | ORAL_CAPSULE | Freq: Every day | ORAL | 1 refills | Status: DC

## 2016-12-24 NOTE — Progress Notes (Signed)
Pre visit review using our clinic review tool, if applicable. No additional management support is needed unless otherwise documented below in the visit note. 
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
## 2016-12-31 ENCOUNTER — Other Ambulatory Visit: Payer: Self-pay | Admitting: Family Medicine
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
## 2017-02-06 ENCOUNTER — Encounter: Payer: Self-pay | Admitting: Family Medicine

## 2017-02-11 ENCOUNTER — Emergency Department (HOSPITAL_COMMUNITY)
Admission: EM | Admit: 2017-02-11 | Discharge: 2017-02-11 | Disposition: A | Discharge status: Home / Self Care | Payer: Medicare Other | Attending: Emergency Medicine | Admitting: Emergency Medicine

## 2017-02-11 ENCOUNTER — Encounter (HOSPITAL_COMMUNITY): Payer: Self-pay

## 2017-02-11 DIAGNOSIS — E860 Dehydration: Secondary | ICD-10-CM | POA: Insufficient documentation

## 2017-02-11 DIAGNOSIS — F1010 Alcohol abuse, uncomplicated: Secondary | ICD-10-CM

## 2017-02-11 DIAGNOSIS — F17210 Nicotine dependence, cigarettes, uncomplicated: Secondary | ICD-10-CM | POA: Insufficient documentation

## 2017-02-11 DIAGNOSIS — R21 Rash and other nonspecific skin eruption: Secondary | ICD-10-CM | POA: Insufficient documentation

## 2017-02-11 DIAGNOSIS — I10 Essential (primary) hypertension: Secondary | ICD-10-CM | POA: Diagnosis not present

## 2017-02-11 DIAGNOSIS — Z79899 Other long term (current) drug therapy: Secondary | ICD-10-CM | POA: Diagnosis not present

## 2017-02-11 DIAGNOSIS — Z7982 Long term (current) use of aspirin: Secondary | ICD-10-CM | POA: Diagnosis not present

## 2017-02-11 DIAGNOSIS — J449 Chronic obstructive pulmonary disease, unspecified: Secondary | ICD-10-CM | POA: Diagnosis not present

## 2017-02-11 LAB — COMPREHENSIVE METABOLIC PANEL
ALBUMIN: 3.6 g/dL (ref 3.5–5.0)
ALT: 15 U/L — AB (ref 17–63)
AST: 18 U/L (ref 15–41)
Alkaline Phosphatase: 27 U/L — ABNORMAL LOW (ref 38–126)
Anion gap: 10 (ref 5–15)
BUN: 7 mg/dL (ref 6–20)
CHLORIDE: 104 mmol/L (ref 101–111)
CO2: 22 mmol/L (ref 22–32)
CREATININE: 0.61 mg/dL (ref 0.61–1.24)
Calcium: 8.4 mg/dL — ABNORMAL LOW (ref 8.9–10.3)
GFR calc Af Amer: >60 mL/min (ref >60)
GLUCOSE: 83 mg/dL (ref 65–99)
POTASSIUM: 4.4 mmol/L (ref 3.5–5.1)
Sodium: 136 mmol/L (ref 135–145)
Total Bilirubin: 0.3 mg/dL (ref 0.3–1.2)
Total Protein: 6.2 g/dL — ABNORMAL LOW (ref 6.5–8.1)

## 2017-02-11 LAB — CBC WITH DIFFERENTIAL/PLATELET
BASOS ABS: 0 10*3/uL (ref 0.0–0.1)
Basophils Relative: 1 %
EOS PCT: 1 %
Eosinophils Absolute: 0 10*3/uL (ref 0.0–0.7)
HEMATOCRIT: 34.9 % — AB (ref 39.0–52.0)
Hemoglobin: 12 g/dL — ABNORMAL LOW (ref 13.0–17.0)
LYMPHS PCT: 44 %
Lymphs Abs: 1.4 10*3/uL (ref 0.7–4.0)
MCH: 32.5 pg (ref 26.0–34.0)
MCHC: 34.4 g/dL (ref 30.0–36.0)
MCV: 94.6 fL (ref 78.0–100.0)
MONO ABS: 0.2 10*3/uL (ref 0.1–1.0)
MONOS PCT: 7 %
NEUTROS ABS: 1.5 10*3/uL — AB (ref 1.7–7.7)
Neutrophils Relative %: 47 %
PLATELETS: 178 10*3/uL (ref 150–400)
RBC: 3.69 MIL/uL — ABNORMAL LOW (ref 4.22–5.81)
RDW: 13.9 % (ref 11.5–15.5)
WBC: 3.2 10*3/uL — ABNORMAL LOW (ref 4.0–10.5)

## 2017-02-11 LAB — RAPID URINE DRUG SCREEN, HOSP PERFORMED
Amphetamines: NOT DETECTED
BARBITURATES: NOT DETECTED
BENZODIAZEPINES: POSITIVE — AB
Cocaine: POSITIVE — AB
Opiates: NOT DETECTED
Tetrahydrocannabinol: NOT DETECTED

## 2017-02-11 LAB — ETHANOL: Alcohol, Ethyl (B): 107 mg/dL — ABNORMAL HIGH (ref <5)

## 2017-02-11 MED ORDER — LORAZEPAM 1 MG PO TABS
0.0000 mg | ORAL_TABLET | Freq: Four times a day (QID) | ORAL | Status: DC
  Administered 2017-02-11: 1 mg via ORAL
  Filled 2017-02-11: qty 1

## 2017-02-11 MED ORDER — LORAZEPAM 1 MG PO TABS: 0.0000 mg | ORAL_TABLET | Freq: Two times a day (BID) | ORAL | Status: DC

## 2017-02-11 MED ORDER — ONDANSETRON HCL 4 MG PO TABS: 4.0000 mg | ORAL_TABLET | Freq: Three times a day (TID) | ORAL | Status: DC | PRN

## 2017-02-11 MED ORDER — SODIUM CHLORIDE 0.9 % IV BOLUS (SEPSIS)
1000.0000 mL | Freq: Once | INTRAVENOUS | Status: AC
  Administered 2017-02-11: 1000 mL via INTRAVENOUS

## 2017-02-11 MED ORDER — ACETAMINOPHEN 325 MG PO TABS: 650.0000 mg | ORAL_TABLET | ORAL | Status: DC | PRN

## 2017-02-11 MED ORDER — DULOXETINE HCL 30 MG PO CPEP
30.0000 mg | ORAL_CAPSULE | Freq: Every day | ORAL | Status: DC
Start: 2017-02-11 — End: 2017-02-11

## 2017-02-11 MED ORDER — THIAMINE HCL 100 MG/ML IJ SOLN
100.0000 mg | Freq: Once | INTRAMUSCULAR | Status: AC
  Administered 2017-02-11: 100 mg via INTRAVENOUS
  Filled 2017-02-11: qty 2

## 2017-02-11 NOTE — ED Triage Notes (Signed)
Pt reports he has been doing cocaine and ETOH- pt requesting to get " dry" from drugs and etoh. Last cocaine use per pt was appox midnight. Pt denies any thoughts of wanting to hurt himself.
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
## 2017-02-11 NOTE — BH Assessment (Signed)
Elmarie Shiley NP recommends detox but understands the patient has refused. Davis request referrals be faxed the patient. TTS to fax referrals [phone]

## 2017-02-14 ENCOUNTER — Telehealth: Payer: Self-pay | Admitting: Family Medicine

## 2017-02-14 NOTE — Telephone Encounter (Signed)
Patient states he is unable to sleep since Dr. Anitra Lauth has taken him off of Xanax.  He states he also has a constant ringing in his head.  Patient admits he drank approximately 14 beers and is still unable to sleep.  He states he needs something to help him sleep.

## 2017-02-14 NOTE — Telephone Encounter (Signed)
As long as he continues to drink, I am not going to prescribe any of this type of medication for him. It would be too dangerous, and it is my job to [place] to my patients.  Reassure him that his sleep pattern will normalize over the next few weeks. Sorry--thx.

## 2017-02-14 NOTE — Telephone Encounter (Signed)
Please advise. Thanks.  

## 2017-02-15 NOTE — Telephone Encounter (Signed)
Left message for pt to call back  °

## 2017-02-15 NOTE — Telephone Encounter (Signed)
Pt advised and voiced understanding.   

## 2017-03-16 ENCOUNTER — Other Ambulatory Visit: Payer: Self-pay | Admitting: Family Medicine

## 2017-03-18 NOTE — Telephone Encounter (Signed)
[place].  RF request for Proair LOV: 12/24/16 Next ov: None Last written: 04/30/16 #8.5g w/ 6RF

## 2017-03-19 ENCOUNTER — Ambulatory Visit (INDEPENDENT_AMBULATORY_CARE_PROVIDER_SITE_OTHER): Payer: Medicare Other

## 2017-03-19 DIAGNOSIS — E538 Deficiency of other specified B group vitamins: Secondary | ICD-10-CM | POA: Diagnosis not present

## 2017-03-19 MED ORDER — CYANOCOBALAMIN 1000 MCG/ML IJ SOLN
1000.0000 ug | Freq: Once | INTRAMUSCULAR | Status: AC
  Administered 2017-03-19: 1000 ug via INTRAMUSCULAR

## 2017-03-19 NOTE — Progress Notes (Signed)
Patient presented today for Vitamin B12 injection. Tolerated injection with no incidence or problems. Patient left with no complaints.

## 2017-04-10 ENCOUNTER — Other Ambulatory Visit: Payer: Self-pay | Admitting: Family Medicine

## 2017-04-10 NOTE — Telephone Encounter (Signed)
[place].  RF request for lisinopril LOV: 12/24/16 Next ov: 04/22/17 Last written: 10/31/16 #30 w/ 3RF

## 2017-04-22 ENCOUNTER — Encounter: Payer: Self-pay | Admitting: Family Medicine

## 2017-04-22 ENCOUNTER — Ambulatory Visit: Payer: Medicare Other | Admitting: Family Medicine
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
## 2017-04-24 ENCOUNTER — Other Ambulatory Visit: Payer: Self-pay | Admitting: Family Medicine

## 2017-05-03 ENCOUNTER — Other Ambulatory Visit: Payer: Self-pay | Admitting: Family Medicine

## 2017-05-13 ENCOUNTER — Ambulatory Visit (INDEPENDENT_AMBULATORY_CARE_PROVIDER_SITE_OTHER): Payer: Medicare Other | Admitting: Family Medicine

## 2017-05-13 ENCOUNTER — Encounter: Payer: Self-pay | Admitting: Family Medicine

## 2017-05-13 VITALS — BP 107/68 | HR 77 | Temp 98.2°F | Resp 16 | Ht 68.75 in | Wt 163.0 lb

## 2017-05-13 DIAGNOSIS — F329 Major depressive disorder, single episode, unspecified: Secondary | ICD-10-CM | POA: Diagnosis not present

## 2017-05-13 DIAGNOSIS — J449 Chronic obstructive pulmonary disease, unspecified: Secondary | ICD-10-CM | POA: Diagnosis not present

## 2017-05-13 DIAGNOSIS — F1021 Alcohol dependence, in remission: Secondary | ICD-10-CM | POA: Diagnosis not present

## 2017-05-13 DIAGNOSIS — F419 Anxiety disorder, unspecified: Secondary | ICD-10-CM

## 2017-05-13 DIAGNOSIS — I10 Essential (primary) hypertension: Secondary | ICD-10-CM

## 2017-05-13 DIAGNOSIS — E538 Deficiency of other specified B group vitamins: Secondary | ICD-10-CM | POA: Diagnosis not present

## 2017-05-13 DIAGNOSIS — F32A Depression, unspecified: Secondary | ICD-10-CM

## 2017-05-13 MED ORDER — CYANOCOBALAMIN 1000 MCG/ML IJ SOLN
1000.0000 ug | Freq: Once | INTRAMUSCULAR | Status: AC
  Administered 2017-05-13: 1000 ug via INTRAMUSCULAR
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
## 2017-06-04 ENCOUNTER — Telehealth: Payer: Self-pay | Admitting: Family Medicine

## 2017-06-04 ENCOUNTER — Other Ambulatory Visit: Payer: Self-pay | Admitting: Family Medicine

## 2017-06-04 NOTE — Telephone Encounter (Signed)
[place]  RF request for alprazolam LOV: 05/13/17 Next ov: None   Medication not on pts med list.  Please advise. Thanks.

## 2017-06-04 NOTE — Telephone Encounter (Signed)
Patient has scratch on arm that is infected. Can he get a Rx sent to [facility]?

## 2017-06-04 NOTE — Telephone Encounter (Signed)
SW pt advised him that he will need to schedule an apt to be evaluated to determine if a prescription is needed. Pt voiced understanding and stated that our office is to far from him to make an apt. I advised him that he can also go to an urgent care near him if he feels this may need treatment. Pt voiced understanding.

## 2017-06-10 ENCOUNTER — Telehealth: Payer: Self-pay | Admitting: Family Medicine

## 2017-06-10 NOTE — Telephone Encounter (Signed)
Have you received the results for this test?

## 2017-06-10 NOTE — Telephone Encounter (Signed)
Patient states pcp ordered breathing test.  He is requesting call back with results.

## 2017-06-10 NOTE — Telephone Encounter (Signed)
I have not seen results. Can you track down when this was done and what resp therapy company did it? -thx

## 2017-06-10 NOTE — Telephone Encounter (Signed)
SW pt and he stated that he received the test/box from [employer] and returned it.  SW [place] and she stated that box was shipped to pt last week and they have not received the box back yet.

## 2017-06-12 ENCOUNTER — Encounter: Payer: Self-pay | Admitting: Family Medicine

## 2017-06-12 DIAGNOSIS — J449 Chronic obstructive pulmonary disease, unspecified: Secondary | ICD-10-CM | POA: Diagnosis not present

## 2017-06-13 DIAGNOSIS — J449 Chronic obstructive pulmonary disease, unspecified: Secondary | ICD-10-CM | POA: Diagnosis not present

## 2017-06-21 ENCOUNTER — Telehealth: Payer: Self-pay | Admitting: Family Medicine

## 2017-06-21 NOTE — Telephone Encounter (Signed)
His overnight oximetry showed that his oxygen dropped slightly and briefly, but it did this enough to qualify for overnight oxygen supplementation.   Pls fax form I filled out to [employer]. --thx

## 2017-06-21 NOTE — Telephone Encounter (Signed)
Dr. Anitra Lauth, I think this result came in. Do you have it?

## 2017-06-21 NOTE — Telephone Encounter (Signed)
Tried calling NA, and unable to leave a message due to vm not being set up.

## 2017-06-21 NOTE — Telephone Encounter (Signed)
Patient requesting call back with results of breathing test.

## 2017-06-24 ENCOUNTER — Telehealth: Payer: Self-pay | Admitting: Family Medicine

## 2017-06-24 NOTE — Telephone Encounter (Signed)
Pt advised and voiced understanding.   

## 2017-06-24 NOTE — Telephone Encounter (Signed)
Tried calling, NA and unable to leave a message.

## 2017-06-24 NOTE — Telephone Encounter (Signed)
Paige with [employer] calling to advise she received orders for this patient.  However, they no longer accept respiratory orders in the [facility] area.  Order will need to be reassigned elsewhere.    Please return call at [phone].

## 2017-06-28 NOTE — Telephone Encounter (Signed)
OK Rx written

## 2017-06-28 NOTE — Telephone Encounter (Signed)
Need a written order to be faxed or picked up by pt. I have put the original request on you desk.

## 2017-07-01 NOTE — Telephone Encounter (Signed)
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 She stated that she does not know when he will get out. She stated that they did tell her that he could have his oxygen if needed.

## 2017-07-01 NOTE — Telephone Encounter (Signed)
Pts wife advised and voiced understanding, okay per DPR. She stated that she will talk to the nurse and doctor at the corrections office and see if they can do anything to help.

## 2017-07-01 NOTE — Telephone Encounter (Signed)
Order, pt demo, and insurance faxed to [facility] in [place], [place].

## 2017-07-01 NOTE — Telephone Encounter (Signed)
Jacob Frank with [facility] called stating that she received the orders of nocturnal oxygen but that due to Medicare Guidelines pts will need to be seen again. She stated that everything has to be done in 30 days, ov, study and order. She stated that when pt comes back in for this to put the order in [facility] and send her a staff message she will be able to handle everything from there on.

## 2017-12-26 ENCOUNTER — Other Ambulatory Visit: Payer: Self-pay | Admitting: Family Medicine

## 2018-01-02 ENCOUNTER — Ambulatory Visit: Payer: Medicare Other | Admitting: Family Medicine
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
## 2018-01-08 ENCOUNTER — Ambulatory Visit (INDEPENDENT_AMBULATORY_CARE_PROVIDER_SITE_OTHER): Payer: Medicare Other | Admitting: Family Medicine

## 2018-01-08 ENCOUNTER — Encounter: Payer: Self-pay | Admitting: Family Medicine

## 2018-01-08 VITALS — BP 100/61 | HR 71 | Temp 98.0°F | Resp 16 | Ht 68.75 in | Wt 174.1 lb

## 2018-01-08 DIAGNOSIS — J449 Chronic obstructive pulmonary disease, unspecified: Secondary | ICD-10-CM

## 2018-01-08 DIAGNOSIS — F1021 Alcohol dependence, in remission: Secondary | ICD-10-CM

## 2018-01-08 DIAGNOSIS — F17201 Nicotine dependence, unspecified, in remission: Secondary | ICD-10-CM | POA: Diagnosis not present

## 2018-01-08 DIAGNOSIS — I10 Essential (primary) hypertension: Secondary | ICD-10-CM | POA: Diagnosis not present

## 2018-01-08 DIAGNOSIS — E538 Deficiency of other specified B group vitamins: Secondary | ICD-10-CM

## 2018-01-08 DIAGNOSIS — D649 Anemia, unspecified: Secondary | ICD-10-CM | POA: Diagnosis not present

## 2018-01-08 LAB — COMPREHENSIVE METABOLIC PANEL
ALT: 19 U/L (ref 0–53)
AST: 16 U/L (ref 0–37)
Albumin: 4.6 g/dL (ref 3.5–5.2)
Alkaline Phosphatase: 42 U/L (ref 39–117)
BUN: 12 mg/dL (ref 6–23)
CHLORIDE: 94 meq/L — AB (ref 96–112)
CO2: 27 meq/L (ref 19–32)
Calcium: 9.2 mg/dL (ref 8.4–10.5)
Creatinine, Ser: 0.83 mg/dL (ref 0.40–1.50)
GFR: 98.7 mL/min (ref >60.00)
GLUCOSE: 95 mg/dL (ref 70–99)
POTASSIUM: 4.4 meq/L (ref 3.5–5.1)
SODIUM: 128 meq/L — AB (ref 135–145)
Total Bilirubin: 0.4 mg/dL (ref 0.2–1.2)
Total Protein: 7 g/dL (ref 6.0–8.3)

## 2018-01-08 LAB — CBC WITH DIFFERENTIAL/PLATELET
BASOS PCT: 0.4 % (ref 0.0–3.0)
Basophils Absolute: 0 10*3/uL (ref 0.0–0.1)
EOS PCT: 0.3 % (ref 0.0–5.0)
Eosinophils Absolute: 0 10*3/uL (ref 0.0–0.7)
HCT: 41.2 % (ref 39.0–52.0)
Hemoglobin: 14.1 g/dL (ref 13.0–17.0)
LYMPHS ABS: 1.3 10*3/uL (ref 0.7–4.0)
Lymphocytes Relative: 20.4 % (ref 12.0–46.0)
MCHC: 34.3 g/dL (ref 30.0–36.0)
MCV: 91.5 fl (ref 78.0–100.0)
MONO ABS: 0.4 10*3/uL (ref 0.1–1.0)
MONOS PCT: 6.7 % (ref 3.0–12.0)
NEUTROS PCT: 72.2 % (ref 43.0–77.0)
Neutro Abs: 4.8 10*3/uL (ref 1.4–7.7)
Platelets: 315 10*3/uL (ref 150.0–400.0)
RBC: 4.5 Mil/uL (ref 4.22–5.81)
RDW: 14.3 % (ref 11.5–15.5)
WBC: 6.6 10*3/uL (ref 4.0–10.5)

## 2018-01-08 MED ORDER — CYANOCOBALAMIN 1000 MCG/ML IJ SOLN
1000.0000 ug | Freq: Once | INTRAMUSCULAR | Status: AC
  Administered 2018-01-08: 1000 ug via INTRAMUSCULAR

## 2018-01-08 MED ORDER — MIRTAZAPINE 15 MG PO TABS: ORAL_TABLET | ORAL | 0 refills | Status: DC

## 2018-01-08 NOTE — Addendum Note (Signed)
Addended by: Onalee Hua on: 01/08/2018 02:31 PM   Modules accepted: Orders
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
## 2018-02-10 ENCOUNTER — Other Ambulatory Visit: Payer: Self-pay | Admitting: Family Medicine

## 2018-02-10 NOTE — Telephone Encounter (Signed)
[facility]  RF request for mirtazapine LOV: 01/08/18 Next ov: 02/21/18 Last written: 01/08/18 #45 w/ 0RF  Please advise. Thanks.

## 2018-02-21 ENCOUNTER — Ambulatory Visit (INDEPENDENT_AMBULATORY_CARE_PROVIDER_SITE_OTHER): Payer: Medicare Other | Admitting: Family Medicine

## 2018-02-21 ENCOUNTER — Encounter: Payer: Self-pay | Admitting: Family Medicine

## 2018-02-21 VITALS — BP 131/88 | HR 96 | Temp 97.6°F | Resp 16 | Ht 68.75 in | Wt 183.1 lb

## 2018-02-21 DIAGNOSIS — F1010 Alcohol abuse, uncomplicated: Secondary | ICD-10-CM

## 2018-02-21 DIAGNOSIS — Z8619 Personal history of other infectious and parasitic diseases: Secondary | ICD-10-CM | POA: Diagnosis not present

## 2018-02-21 DIAGNOSIS — F411 Generalized anxiety disorder: Secondary | ICD-10-CM

## 2018-02-21 DIAGNOSIS — E538 Deficiency of other specified B group vitamins: Secondary | ICD-10-CM

## 2018-02-21 DIAGNOSIS — E871 Hypo-osmolality and hyponatremia: Secondary | ICD-10-CM | POA: Diagnosis not present

## 2018-02-21 DIAGNOSIS — I10 Essential (primary) hypertension: Secondary | ICD-10-CM | POA: Diagnosis not present

## 2018-02-21 DIAGNOSIS — F3341 Major depressive disorder, recurrent, in partial remission: Secondary | ICD-10-CM | POA: Diagnosis not present

## 2018-02-21 DIAGNOSIS — E663 Overweight: Secondary | ICD-10-CM | POA: Diagnosis not present

## 2018-02-21 LAB — COMPREHENSIVE METABOLIC PANEL
ALT: 13 U/L (ref 0–53)
AST: 13 U/L (ref 0–37)
Albumin: 4.3 g/dL (ref 3.5–5.2)
Alkaline Phosphatase: 42 U/L (ref 39–117)
BILIRUBIN TOTAL: 0.3 mg/dL (ref 0.2–1.2)
BUN: 16 mg/dL (ref 6–23)
CALCIUM: 9.5 mg/dL (ref 8.4–10.5)
CO2: 27 meq/L (ref 19–32)
CREATININE: 0.91 mg/dL (ref 0.40–1.50)
Chloride: 102 mEq/L (ref 96–112)
GFR: 88.72 mL/min (ref >60.00)
GLUCOSE: 104 mg/dL — AB (ref 70–99)
Potassium: 4.2 mEq/L (ref 3.5–5.1)
Sodium: 139 mEq/L (ref 135–145)
Total Protein: 6.5 g/dL (ref 6.0–8.3)

## 2018-02-21 MED ORDER — MIRTAZAPINE 30 MG PO TABS: 30.0000 mg | ORAL_TABLET | Freq: Every day | ORAL | 2 refills | Status: DC

## 2018-02-21 MED ORDER — CYANOCOBALAMIN 1000 MCG/ML IJ SOLN
1000.0000 ug | Freq: Once | INTRAMUSCULAR | Status: AC
  Administered 2018-02-21: 1000 ug via INTRAMUSCULAR
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
## 2018-02-26 ENCOUNTER — Other Ambulatory Visit: Payer: Self-pay | Admitting: Family Medicine

## 2018-03-04 ENCOUNTER — Other Ambulatory Visit: Payer: Self-pay | Admitting: Family Medicine

## 2018-04-16 ENCOUNTER — Other Ambulatory Visit: Payer: Self-pay | Admitting: Family Medicine

## 2018-05-26 ENCOUNTER — Other Ambulatory Visit: Payer: Self-pay | Admitting: Family Medicine

## 2018-06-26 ENCOUNTER — Other Ambulatory Visit: Payer: Self-pay | Admitting: Family Medicine

## 2018-06-26 NOTE — Telephone Encounter (Signed)
Called pt, NA, unable to leave a message due to phone not taking call at this time.

## 2018-07-01 NOTE — Telephone Encounter (Signed)
Called pt, NA, unable to leave a message due to phone not taking call at this time.

## 2018-07-07 NOTE — Telephone Encounter (Signed)
Apt made for 07/11/18 at 1:15pm

## 2018-07-10 ENCOUNTER — Other Ambulatory Visit: Payer: Self-pay | Admitting: Family Medicine

## 2018-07-11 ENCOUNTER — Encounter: Payer: Self-pay | Admitting: Family Medicine

## 2018-07-11 ENCOUNTER — Ambulatory Visit (INDEPENDENT_AMBULATORY_CARE_PROVIDER_SITE_OTHER): Payer: Medicare Other | Admitting: Family Medicine

## 2018-07-11 VITALS — BP 117/71 | HR 92 | Temp 98.0°F | Resp 16 | Ht 68.75 in | Wt 196.1 lb

## 2018-07-11 DIAGNOSIS — I10 Essential (primary) hypertension: Secondary | ICD-10-CM | POA: Diagnosis not present

## 2018-07-11 DIAGNOSIS — J449 Chronic obstructive pulmonary disease, unspecified: Secondary | ICD-10-CM | POA: Diagnosis not present

## 2018-07-11 DIAGNOSIS — Z23 Encounter for immunization: Secondary | ICD-10-CM | POA: Diagnosis not present

## 2018-07-11 DIAGNOSIS — F1020 Alcohol dependence, uncomplicated: Secondary | ICD-10-CM

## 2018-07-11 DIAGNOSIS — F411 Generalized anxiety disorder: Secondary | ICD-10-CM

## 2018-07-11 DIAGNOSIS — E538 Deficiency of other specified B group vitamins: Secondary | ICD-10-CM

## 2018-07-11 DIAGNOSIS — F1911 Other psychoactive substance abuse, in remission: Secondary | ICD-10-CM

## 2018-07-11 LAB — BASIC METABOLIC PANEL
BUN: 12 mg/dL (ref 6–23)
CO2: 29 meq/L (ref 19–32)
Calcium: 9.4 mg/dL (ref 8.4–10.5)
Chloride: 100 mEq/L (ref 96–112)
Creatinine, Ser: 1.23 mg/dL (ref 0.40–1.50)
GFR: 62.59 mL/min (ref >60.00)
GLUCOSE: 94 mg/dL (ref 70–99)
POTASSIUM: 4.3 meq/L (ref 3.5–5.1)
Sodium: 138 mEq/L (ref 135–145)

## 2018-07-11 LAB — TSH: TSH: 0.79 u[IU]/mL (ref 0.35–4.50)

## 2018-07-11 MED ORDER — CYANOCOBALAMIN 1000 MCG/ML IJ SOLN
1000.0000 ug | Freq: Once | INTRAMUSCULAR | Status: AC
  Administered 2018-07-11: 1000 ug via INTRAMUSCULAR

## 2018-07-11 MED ORDER — MIRTAZAPINE 30 MG PO TABS: 30.0000 mg | ORAL_TABLET | Freq: Every day | ORAL | 1 refills | Status: DC

## 2018-07-11 MED ORDER — TIOTROPIUM BROMIDE MONOHYDRATE 18 MCG IN CAPS: 18.0000 ug | ORAL_CAPSULE | Freq: Every day | RESPIRATORY_TRACT | 11 refills | Status: DC
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
## 2018-07-16 ENCOUNTER — Telehealth: Payer: Self-pay

## 2018-07-16 MED ORDER — UMECLIDINIUM BROMIDE 62.5 MCG/INH IN AEPB: 1.0000 | INHALATION_SPRAY | Freq: Every day | RESPIRATORY_TRACT | 5 refills | Status: DC

## 2018-07-16 NOTE — Telephone Encounter (Signed)
Spiriva is not covered by insurance until patient has tried Atrovent or Incruse Ellipta.

## 2018-07-16 NOTE — Telephone Encounter (Signed)
Patient notified and verbalized understanding. 

## 2018-07-16 NOTE — Telephone Encounter (Signed)
Copied from [place] [id number]. Topic: General - Other >> Jul 15, 2018  4:14 PM Janace Aris A wrote: Reason for CRM: Patient called in, he has questions about his new inhaler he was prescribed... Says the pharmacy has to fax over paper work to the office, and he would like to know the status of it.   Please advise

## 2018-07-16 NOTE — Telephone Encounter (Signed)
OK. I just sent in a rx for incruse ellipta for pt to try.-thx

## 2018-07-16 NOTE — Telephone Encounter (Signed)
Prior authorization submitted, message received stating that patient will have to try different medication before considering Spiriva. Message sent to Dr. Anitra Lauth.

## 2018-08-15 ENCOUNTER — Other Ambulatory Visit: Payer: Self-pay

## 2018-08-18 NOTE — Telephone Encounter (Signed)
Seth Bake with Cover my meds would like to know if you are still pursuing this PA [phone]  Ref  [phone]

## 2018-08-19 NOTE — Telephone Encounter (Signed)
[facility] notified that another medication was sent in.

## 2018-09-18 DIAGNOSIS — J984 Other disorders of lung: Secondary | ICD-10-CM | POA: Diagnosis not present

## 2018-09-19 DIAGNOSIS — R197 Diarrhea, unspecified: Secondary | ICD-10-CM | POA: Diagnosis not present

## 2018-09-19 DIAGNOSIS — R112 Nausea with vomiting, unspecified: Secondary | ICD-10-CM | POA: Diagnosis not present

## 2018-09-19 DIAGNOSIS — E871 Hypo-osmolality and hyponatremia: Secondary | ICD-10-CM | POA: Diagnosis not present

## 2018-09-22 ENCOUNTER — Other Ambulatory Visit: Payer: Self-pay | Admitting: Family Medicine

## 2018-09-29 ENCOUNTER — Inpatient Hospital Stay: Payer: Self-pay | Admitting: Family Medicine

## 2018-09-30 ENCOUNTER — Encounter: Payer: Self-pay | Admitting: Family Medicine

## 2018-10-03 ENCOUNTER — Other Ambulatory Visit: Payer: Self-pay | Admitting: Family Medicine

## 2018-10-03 ENCOUNTER — Inpatient Hospital Stay: Payer: Self-pay | Admitting: Family Medicine

## 2018-10-06 ENCOUNTER — Other Ambulatory Visit: Payer: Self-pay | Admitting: Family Medicine

## 2018-10-09 ENCOUNTER — Encounter: Payer: Self-pay | Admitting: *Deleted

## 2018-10-09 DIAGNOSIS — F419 Anxiety disorder, unspecified: Secondary | ICD-10-CM

## 2018-10-09 DIAGNOSIS — F329 Major depressive disorder, single episode, unspecified: Secondary | ICD-10-CM | POA: Insufficient documentation

## 2018-10-10 ENCOUNTER — Encounter: Payer: Self-pay | Admitting: Family Medicine

## 2018-10-10 ENCOUNTER — Telehealth: Payer: Self-pay | Admitting: Family Medicine

## 2018-10-10 ENCOUNTER — Ambulatory Visit (INDEPENDENT_AMBULATORY_CARE_PROVIDER_SITE_OTHER): Payer: Medicare Other | Admitting: Family Medicine

## 2018-10-10 VITALS — BP 118/78 | HR 90 | Temp 98.3°F | Resp 16 | Ht 68.75 in | Wt 188.2 lb

## 2018-10-10 DIAGNOSIS — F1911 Other psychoactive substance abuse, in remission: Secondary | ICD-10-CM

## 2018-10-10 DIAGNOSIS — I10 Essential (primary) hypertension: Secondary | ICD-10-CM

## 2018-10-10 DIAGNOSIS — Z23 Encounter for immunization: Secondary | ICD-10-CM

## 2018-10-10 DIAGNOSIS — F411 Generalized anxiety disorder: Secondary | ICD-10-CM | POA: Diagnosis not present

## 2018-10-10 DIAGNOSIS — E538 Deficiency of other specified B group vitamins: Secondary | ICD-10-CM

## 2018-10-10 DIAGNOSIS — E663 Overweight: Secondary | ICD-10-CM | POA: Diagnosis not present

## 2018-10-10 DIAGNOSIS — J449 Chronic obstructive pulmonary disease, unspecified: Secondary | ICD-10-CM

## 2018-10-10 LAB — COMPREHENSIVE METABOLIC PANEL
ALT: 30 U/L (ref 0–53)
AST: 21 U/L (ref 0–37)
Albumin: 4.5 g/dL (ref 3.5–5.2)
Alkaline Phosphatase: 39 U/L (ref 39–117)
BILIRUBIN TOTAL: 0.4 mg/dL (ref 0.2–1.2)
BUN: 7 mg/dL (ref 6–23)
CALCIUM: 9.4 mg/dL (ref 8.4–10.5)
CO2: 28 meq/L (ref 19–32)
Chloride: 98 mEq/L (ref 96–112)
Creatinine, Ser: 0.85 mg/dL (ref 0.40–1.50)
GFR: 95.8 mL/min (ref >60.00)
Glucose, Bld: 75 mg/dL (ref 70–99)
Potassium: 4.5 mEq/L (ref 3.5–5.1)
Sodium: 137 mEq/L (ref 135–145)
Total Protein: 6.9 g/dL (ref 6.0–8.3)

## 2018-10-10 LAB — VITAMIN B12: Vitamin B-12: 396 pg/mL (ref 211–911)

## 2018-10-10 MED ORDER — CYANOCOBALAMIN 1000 MCG/ML IJ SOLN
1000.0000 ug | Freq: Once | INTRAMUSCULAR | Status: AC
  Administered 2018-10-10: 1000 ug via INTRAMUSCULAR

## 2018-10-10 MED ORDER — MIRTAZAPINE 45 MG PO TABS: 45.0000 mg | ORAL_TABLET | Freq: Every day | ORAL | 2 refills | Status: DC

## 2018-10-10 NOTE — Telephone Encounter (Signed)
He requested a service dog so he could just talk to it. He does not have any physical or neurological limitation that qualifies him for a service dog. I won't write a letter for the service dog just so he can have a dog to talk to.  Sorry.-thx

## 2018-10-10 NOTE — Telephone Encounter (Signed)
Pt requesting letter.   Please advise. Thanks.

## 2018-10-10 NOTE — Telephone Encounter (Signed)
Tried calling pt, NA and unable to leave a message due to vm box being full or not set up yet.
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
## 2018-10-10 NOTE — Telephone Encounter (Signed)
Patient is requesting paperwork be filled out by Dr. Anitra Lauth so patient will be able to have a service dog in his apartment.  Please Advise, thanks

## 2018-10-13 NOTE — Telephone Encounter (Signed)
Patient aware that Dr Anitra Lauth will not be writing letter to support patient having service dog.   Patient states that he did have "neurological disorders?, I am crazy as hell!"    I advised him that I am sorry but that is not a neurological disorder and he states that he will find someone who will write him that letter.

## 2018-10-13 NOTE — Telephone Encounter (Signed)
Noted  

## 2018-10-20 ENCOUNTER — Other Ambulatory Visit: Payer: Self-pay | Admitting: Family Medicine
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
## 2018-11-04 NOTE — Congregational Nurse Program (Signed)
Seen at the [facility]. No complaints. Stated he is taking his medications as prescribed by his MD. B P 115/77 P [place], [place], [phone]

## 2018-11-06 NOTE — Congregational Nurse Program (Signed)
Seen for blood pressure check. No complaints or concerns. BP 129/88 P [address] RN, [place] [facility], [phone]

## 2018-11-18 NOTE — Congregational Nurse Program (Signed)
Stated he was feeling so so. No acute distress. Has MD appointment  next month. Appetite good. Has trouble some days with breathing   but overall he said he is good. B P 122/83 P [address] RN, [place], [phone]

## 2018-12-01 ENCOUNTER — Other Ambulatory Visit: Payer: Self-pay | Admitting: Family Medicine

## 2018-12-05 ENCOUNTER — Encounter: Payer: Self-pay | Admitting: Family Medicine

## 2018-12-05 ENCOUNTER — Other Ambulatory Visit: Payer: Self-pay | Admitting: Family Medicine

## 2018-12-05 ENCOUNTER — Ambulatory Visit (INDEPENDENT_AMBULATORY_CARE_PROVIDER_SITE_OTHER): Payer: Medicare Other | Admitting: Family Medicine

## 2018-12-05 VITALS — BP 125/83 | HR 82 | Temp 97.8°F | Resp 16 | Ht 68.75 in | Wt 195.4 lb

## 2018-12-05 DIAGNOSIS — F411 Generalized anxiety disorder: Secondary | ICD-10-CM

## 2018-12-05 DIAGNOSIS — G4700 Insomnia, unspecified: Secondary | ICD-10-CM | POA: Diagnosis not present

## 2018-12-05 DIAGNOSIS — E538 Deficiency of other specified B group vitamins: Secondary | ICD-10-CM | POA: Diagnosis not present

## 2018-12-05 MED ORDER — CYANOCOBALAMIN 1000 MCG/ML IJ SOLN
1000.0000 ug | Freq: Once | INTRAMUSCULAR | Status: AC
  Administered 2018-12-05: 1000 ug via INTRAMUSCULAR

## 2018-12-05 MED ORDER — QUETIAPINE FUMARATE 50 MG PO TABS: ORAL_TABLET | ORAL | 0 refills | Status: DC
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
## 2018-12-07 NOTE — Congregational Nurse Program (Signed)
I am doing pre.tty good today. No complaints. [name] RN, [facility], [phone]

## 2018-12-21 NOTE — Congregational Nurse Program (Signed)
Stated he has been having a lot of respiratory issues with the pollen. at [facility] last week. No new medications  prescribed B P  134/88, P 90  No acute distress at this time. [name] RN, [facility], [phone]

## 2018-12-22 ENCOUNTER — Other Ambulatory Visit: Payer: Self-pay | Admitting: Family Medicine

## 2018-12-23 NOTE — Congregational Nurse Program (Signed)
Stated he is doing fairly well. Has been wheezing and coughing more today after being outside. Temp 98 oral, B P 137/86, P 101. No resp  distress Stated he will be moving into an apartment in [place] on tomorrow and is anxious about the move. Reminded to keep all of his medical appointments. Stated he has an appointment on next month [name] RN, [facility], [phone]

## 2018-12-25 ENCOUNTER — Other Ambulatory Visit: Payer: Self-pay | Admitting: Family Medicine

## 2018-12-26 NOTE — Congregational Nurse Program (Signed)
Spoke with Melissa at the [facility], stated client moved into an apartment in [place], [facility], [phone]

## 2018-12-30 ENCOUNTER — Other Ambulatory Visit: Payer: Self-pay | Admitting: Family Medicine

## 2019-01-02 ENCOUNTER — Ambulatory Visit (INDEPENDENT_AMBULATORY_CARE_PROVIDER_SITE_OTHER): Payer: Medicare Other

## 2019-01-02 ENCOUNTER — Ambulatory Visit: Payer: Self-pay | Admitting: Family Medicine

## 2019-01-02 ENCOUNTER — Other Ambulatory Visit: Payer: Self-pay

## 2019-01-02 DIAGNOSIS — E538 Deficiency of other specified B group vitamins: Secondary | ICD-10-CM

## 2019-01-02 MED ORDER — CYANOCOBALAMIN 1000 MCG/ML IJ SOLN
1000.0000 ug | Freq: Once | INTRAMUSCULAR | Status: AC
  Administered 2019-01-02: 1000 ug via INTRAMUSCULAR

## 2019-01-02 NOTE — Progress Notes (Addendum)
Jacob Frank is a 68 y.o. male presents to the office today for B12 injection, per physician's orders. Original order: Inject 1073mcg into the muscle every 30 days. Cyanocobalamin, 1043mcg,  was administered Left Deltoid IM today. Patient tolerated injection. Patient due for follow up labs/provider appt: No. Date due: n/a, appt made No Patient next injection due: 1 month, appt made No   ADDENDUM: Pt with vit B12 deficiency.  Agree with vit B12 1000 mcg IM in office today. Signed:  Crissie Sickles, MD           01/02/2019

## 2019-01-28 ENCOUNTER — Other Ambulatory Visit: Payer: Self-pay | Admitting: Family Medicine

## 2019-01-29 ENCOUNTER — Other Ambulatory Visit: Payer: Self-pay

## 2019-01-29 ENCOUNTER — Ambulatory Visit (INDEPENDENT_AMBULATORY_CARE_PROVIDER_SITE_OTHER): Payer: Medicare Other | Admitting: Family Medicine

## 2019-01-29 ENCOUNTER — Encounter: Payer: Self-pay | Admitting: Family Medicine

## 2019-01-29 DIAGNOSIS — F5104 Psychophysiologic insomnia: Secondary | ICD-10-CM | POA: Diagnosis not present

## 2019-01-29 MED ORDER — QUETIAPINE FUMARATE 50 MG PO TABS: ORAL_TABLET | ORAL | 3 refills | Status: DC
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
## 2019-02-02 ENCOUNTER — Other Ambulatory Visit: Payer: Self-pay | Admitting: Family Medicine

## 2019-02-06 ENCOUNTER — Ambulatory Visit (INDEPENDENT_AMBULATORY_CARE_PROVIDER_SITE_OTHER): Payer: Medicare Other

## 2019-02-06 DIAGNOSIS — E538 Deficiency of other specified B group vitamins: Secondary | ICD-10-CM

## 2019-02-06 MED ORDER — CYANOCOBALAMIN 1000 MCG/ML IJ SOLN
1000.0000 ug | Freq: Once | INTRAMUSCULAR | Status: AC
  Administered 2019-02-06: 1000 ug via INTRAMUSCULAR

## 2019-02-06 NOTE — Progress Notes (Signed)
IHAN PAT is a 68 y.o. male presents to the office today for B12 injections, per physician's orders. Original order: Inject 1021mcg into the muscle every 30 days. Cyanocobalamin 1000 mcg administered IM in left deltoid today. Patient tolerated injection. Patient due for follow up labs/provider appt: No. Date due: N/A , appt made No Patient next injection due: 30 days, appt made No  Gerilyn Nestle

## 2019-02-09 NOTE — Progress Notes (Signed)
Pt with vit B12 deficiency.  Agree with vit B12 1000 mcg IM in office today. Signed:  Phil Sarahi Borland, MD           02/09/2019  

## 2019-02-25 ENCOUNTER — Other Ambulatory Visit: Payer: Self-pay

## 2019-02-25 ENCOUNTER — Ambulatory Visit (INDEPENDENT_AMBULATORY_CARE_PROVIDER_SITE_OTHER): Payer: Medicare Other | Admitting: Family Medicine

## 2019-02-25 ENCOUNTER — Encounter: Payer: Self-pay | Admitting: Family Medicine

## 2019-02-25 DIAGNOSIS — J309 Allergic rhinitis, unspecified: Secondary | ICD-10-CM

## 2019-02-25 DIAGNOSIS — R5383 Other fatigue: Secondary | ICD-10-CM | POA: Diagnosis not present

## 2019-02-25 DIAGNOSIS — J441 Chronic obstructive pulmonary disease with (acute) exacerbation: Secondary | ICD-10-CM | POA: Diagnosis not present

## 2019-02-25 MED ORDER — PREDNISONE 20 MG PO TABS: ORAL_TABLET | ORAL | 0 refills | Status: DC

## 2019-02-25 MED ORDER — QUETIAPINE FUMARATE 50 MG PO TABS: ORAL_TABLET | ORAL | 3 refills | Status: DC

## 2019-02-25 MED ORDER — AZITHROMYCIN 250 MG PO TABS: ORAL_TABLET | ORAL | 0 refills | Status: DC
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
## 2019-03-09 ENCOUNTER — Other Ambulatory Visit: Payer: Self-pay | Admitting: Family Medicine

## 2019-04-01 ENCOUNTER — Telehealth: Payer: Self-pay | Admitting: Family Medicine

## 2019-04-01 NOTE — Telephone Encounter (Signed)
Pt is asymptomatic but would like to be tested. He was advised we would let him know about scheduling B12 after PCP decision made.  Please advise, thanks.

## 2019-04-01 NOTE — Telephone Encounter (Signed)
Patient left VM, best friend died from [place]. Patient would like to be tested. Patient would also like to make an appointment to come in for B12.

## 2019-04-01 NOTE — Telephone Encounter (Signed)
I cannot order a test for a patient who is asymptomatic. However, [facility] has a testing center for covid 19 that does not require a doctor's order. Pls give him the [facility] contact information so he can make an appt to get tested-thx

## 2019-04-02 NOTE — Telephone Encounter (Signed)
Patient declined testing, no longer needed. Scheduled nurse visit for B12 injection.

## 2019-04-06 ENCOUNTER — Ambulatory Visit: Payer: Medicare Other

## 2019-04-09 ENCOUNTER — Telehealth: Payer: Self-pay | Admitting: Family Medicine

## 2019-04-09 NOTE — Telephone Encounter (Signed)
Attempted to contact patient regarding symptoms, no answer. No VM available. Pt has appt scheduled with PCP on 04/10/2019.

## 2019-04-09 NOTE — Telephone Encounter (Signed)
Patient called stating his arms are black and blue from lack of oxygen. Patient states he is SOB, thinks he may have been running a fever. Advised patient to go to ED. Patient states he does not have transportation. Advised patient to call ambulance. Patient refused, said he has gangrene and that they would cut his arms off. It was very difficult to keep patient on track in conversation. Patient kept telling stories of his family.

## 2019-04-10 ENCOUNTER — Encounter: Payer: Self-pay | Admitting: Family Medicine

## 2019-04-10 ENCOUNTER — Other Ambulatory Visit: Payer: Self-pay

## 2019-04-10 ENCOUNTER — Ambulatory Visit (INDEPENDENT_AMBULATORY_CARE_PROVIDER_SITE_OTHER): Payer: Medicare Other | Admitting: Family Medicine

## 2019-04-10 DIAGNOSIS — J449 Chronic obstructive pulmonary disease, unspecified: Secondary | ICD-10-CM | POA: Diagnosis not present

## 2019-04-10 DIAGNOSIS — R609 Edema, unspecified: Secondary | ICD-10-CM | POA: Diagnosis not present

## 2019-04-10 DIAGNOSIS — R140 Abdominal distension (gaseous): Secondary | ICD-10-CM | POA: Diagnosis not present

## 2019-04-10 DIAGNOSIS — R05 Cough: Secondary | ICD-10-CM | POA: Diagnosis not present

## 2019-04-10 DIAGNOSIS — R600 Localized edema: Secondary | ICD-10-CM

## 2019-04-10 DIAGNOSIS — R058 Other specified cough: Secondary | ICD-10-CM

## 2019-04-10 DIAGNOSIS — F1011 Alcohol abuse, in remission: Secondary | ICD-10-CM
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
## 2019-04-13 ENCOUNTER — Ambulatory Visit: Payer: Medicare Other

## 2019-04-24 ENCOUNTER — Encounter (HOSPITAL_COMMUNITY): Payer: Self-pay | Admitting: *Deleted

## 2019-04-24 ENCOUNTER — Emergency Department (HOSPITAL_COMMUNITY): Payer: Medicare Other

## 2019-04-24 ENCOUNTER — Inpatient Hospital Stay (HOSPITAL_COMMUNITY)
Admission: EM | Admit: 2019-04-24 | Discharge: 2019-04-28 | DRG: 641 | Disposition: A | Discharge status: Home Health | Payer: Medicare Other | Attending: Internal Medicine | Admitting: Internal Medicine

## 2019-04-24 ENCOUNTER — Other Ambulatory Visit: Payer: Self-pay

## 2019-04-24 ENCOUNTER — Inpatient Hospital Stay (HOSPITAL_COMMUNITY): Payer: Medicare Other

## 2019-04-24 DIAGNOSIS — Z20828 Contact with and (suspected) exposure to other viral communicable diseases: Secondary | ICD-10-CM | POA: Diagnosis not present

## 2019-04-24 DIAGNOSIS — I4891 Unspecified atrial fibrillation: Secondary | ICD-10-CM

## 2019-04-24 DIAGNOSIS — Z79899 Other long term (current) drug therapy: Secondary | ICD-10-CM | POA: Diagnosis not present

## 2019-04-24 DIAGNOSIS — F1010 Alcohol abuse, uncomplicated: Secondary | ICD-10-CM | POA: Diagnosis present

## 2019-04-24 DIAGNOSIS — F329 Major depressive disorder, single episode, unspecified: Secondary | ICD-10-CM | POA: Diagnosis present

## 2019-04-24 DIAGNOSIS — I10 Essential (primary) hypertension: Secondary | ICD-10-CM | POA: Diagnosis present

## 2019-04-24 DIAGNOSIS — J449 Chronic obstructive pulmonary disease, unspecified: Secondary | ICD-10-CM | POA: Diagnosis present

## 2019-04-24 DIAGNOSIS — E861 Hypovolemia: Secondary | ICD-10-CM | POA: Diagnosis not present

## 2019-04-24 DIAGNOSIS — E860 Dehydration: Secondary | ICD-10-CM | POA: Diagnosis present

## 2019-04-24 DIAGNOSIS — I4819 Other persistent atrial fibrillation: Secondary | ICD-10-CM | POA: Diagnosis present

## 2019-04-24 DIAGNOSIS — Z7982 Long term (current) use of aspirin: Secondary | ICD-10-CM

## 2019-04-24 DIAGNOSIS — Z808 Family history of malignant neoplasm of other organs or systems: Secondary | ICD-10-CM | POA: Diagnosis not present

## 2019-04-24 DIAGNOSIS — F419 Anxiety disorder, unspecified: Secondary | ICD-10-CM | POA: Diagnosis present

## 2019-04-24 DIAGNOSIS — Z8261 Family history of arthritis: Secondary | ICD-10-CM | POA: Diagnosis not present

## 2019-04-24 DIAGNOSIS — Z87442 Personal history of urinary calculi: Secondary | ICD-10-CM

## 2019-04-24 DIAGNOSIS — R55 Syncope and collapse: Secondary | ICD-10-CM | POA: Diagnosis present

## 2019-04-24 DIAGNOSIS — Z7951 Long term (current) use of inhaled steroids: Secondary | ICD-10-CM | POA: Diagnosis not present

## 2019-04-24 DIAGNOSIS — Z9114 Patient's other noncompliance with medication regimen: Secondary | ICD-10-CM | POA: Diagnosis not present

## 2019-04-24 DIAGNOSIS — F17210 Nicotine dependence, cigarettes, uncomplicated: Secondary | ICD-10-CM | POA: Diagnosis present

## 2019-04-24 DIAGNOSIS — Z888 Allergy status to other drugs, medicaments and biological substances status: Secondary | ICD-10-CM | POA: Diagnosis not present

## 2019-04-24 DIAGNOSIS — E871 Hypo-osmolality and hyponatremia: Secondary | ICD-10-CM | POA: Diagnosis not present

## 2019-04-24 DIAGNOSIS — E876 Hypokalemia: Secondary | ICD-10-CM | POA: Diagnosis present

## 2019-04-24 DIAGNOSIS — R630 Anorexia: Secondary | ICD-10-CM | POA: Diagnosis present

## 2019-04-24 DIAGNOSIS — J439 Emphysema, unspecified: Secondary | ICD-10-CM | POA: Diagnosis not present

## 2019-04-24 DIAGNOSIS — F17200 Nicotine dependence, unspecified, uncomplicated: Secondary | ICD-10-CM | POA: Diagnosis not present

## 2019-04-24 HISTORY — DX: Essential (primary) hypertension: I10

## 2019-04-24 HISTORY — PX: TRANSTHORACIC ECHOCARDIOGRAM: SHX275

## 2019-04-24 LAB — MAGNESIUM: Magnesium: 1.7 mg/dL (ref 1.7–2.4)

## 2019-04-24 LAB — COMPREHENSIVE METABOLIC PANEL
ALT: 104 U/L — ABNORMAL HIGH (ref 0–44)
AST: 60 U/L — ABNORMAL HIGH (ref 15–41)
Albumin: 4.5 g/dL (ref 3.5–5.0)
Alkaline Phosphatase: 38 U/L (ref 38–126)
Anion gap: 16 — ABNORMAL HIGH (ref 5–15)
BUN: 8 mg/dL (ref 8–23)
CO2: 25 mmol/L (ref 22–32)
Calcium: 9.1 mg/dL (ref 8.9–10.3)
Chloride: 77 mmol/L — ABNORMAL LOW (ref 98–111)
Creatinine, Ser: 0.75 mg/dL (ref 0.61–1.24)
GFR calc Af Amer: >60 mL/min (ref >60)
GFR calc non Af Amer: >60 mL/min (ref >60)
Glucose, Bld: 99 mg/dL (ref 70–99)
Potassium: 3.1 mmol/L — ABNORMAL LOW (ref 3.5–5.1)
Sodium: 118 mmol/L — CL (ref 135–145)
Total Bilirubin: 1.9 mg/dL — ABNORMAL HIGH (ref 0.3–1.2)
Total Protein: 7.1 g/dL (ref 6.5–8.1)

## 2019-04-24 LAB — CORTISOL: Cortisol, Plasma: 21.3 ug/dL

## 2019-04-24 LAB — CBC WITH DIFFERENTIAL/PLATELET
Abs Immature Granulocytes: 0.06 10*3/uL (ref 0.00–0.07)
Basophils Absolute: 0 10*3/uL (ref 0.0–0.1)
Basophils Relative: 0 %
Eosinophils Absolute: 0 10*3/uL (ref 0.0–0.5)
Eosinophils Relative: 0 %
HCT: 41.3 % (ref 39.0–52.0)
Hemoglobin: 14.9 g/dL (ref 13.0–17.0)
Immature Granulocytes: 1 %
Lymphocytes Relative: 10 %
Lymphs Abs: 0.9 10*3/uL (ref 0.7–4.0)
MCH: 33.2 pg (ref 26.0–34.0)
MCHC: 36.1 g/dL — ABNORMAL HIGH (ref 30.0–36.0)
MCV: 92 fL (ref 80.0–100.0)
Monocytes Absolute: 0.8 10*3/uL (ref 0.1–1.0)
Monocytes Relative: 10 %
Neutro Abs: 6.6 10*3/uL (ref 1.7–7.7)
Neutrophils Relative %: 79 %
Platelets: 162 10*3/uL (ref 150–400)
RBC: 4.49 MIL/uL (ref 4.22–5.81)
RDW: 12.3 % (ref 11.5–15.5)
WBC: 8.4 10*3/uL (ref 4.0–10.5)
nRBC: 0 % (ref 0.0–0.2)

## 2019-04-24 LAB — SODIUM, URINE, RANDOM: Sodium, Ur: 36 mmol/L

## 2019-04-24 LAB — TROPONIN I (HIGH SENSITIVITY)
Troponin I (High Sensitivity): 3 ng/L (ref <18)
Troponin I (High Sensitivity): 3 ng/L (ref <18)

## 2019-04-24 LAB — ECHOCARDIOGRAM COMPLETE
Height: 69 in
Weight: 2560 oz

## 2019-04-24 LAB — MRSA PCR SCREENING: MRSA by PCR: NEGATIVE

## 2019-04-24 LAB — SARS CORONAVIRUS 2 BY RT PCR (HOSPITAL ORDER, PERFORMED IN ~~LOC~~ HOSPITAL LAB): SARS Coronavirus 2: NEGATIVE

## 2019-04-24 LAB — CREATININE, URINE, RANDOM: Creatinine, Urine: 18.09 mg/dL

## 2019-04-24 LAB — OSMOLALITY: Osmolality: 251 mOsm/kg — ABNORMAL LOW (ref 275–295)

## 2019-04-24 LAB — TSH: TSH: 0.731 u[IU]/mL (ref 0.350–4.500)

## 2019-04-24 LAB — PHOSPHORUS: Phosphorus: 3.4 mg/dL (ref 2.5–4.6)

## 2019-04-24 IMAGING — CR PORTABLE CHEST - 1 VIEW
1 series · 2 of 2 positions shown · non-contrast
Comparison: [DATE]

CLINICAL DATA: Syncopal episodes, nausea and generalized weakness.

EXAM:
PORTABLE CHEST 1 VIEW

[Series 1: portable · 0.17mm/px · 2 of 2 slices shown]
[im 1/2]
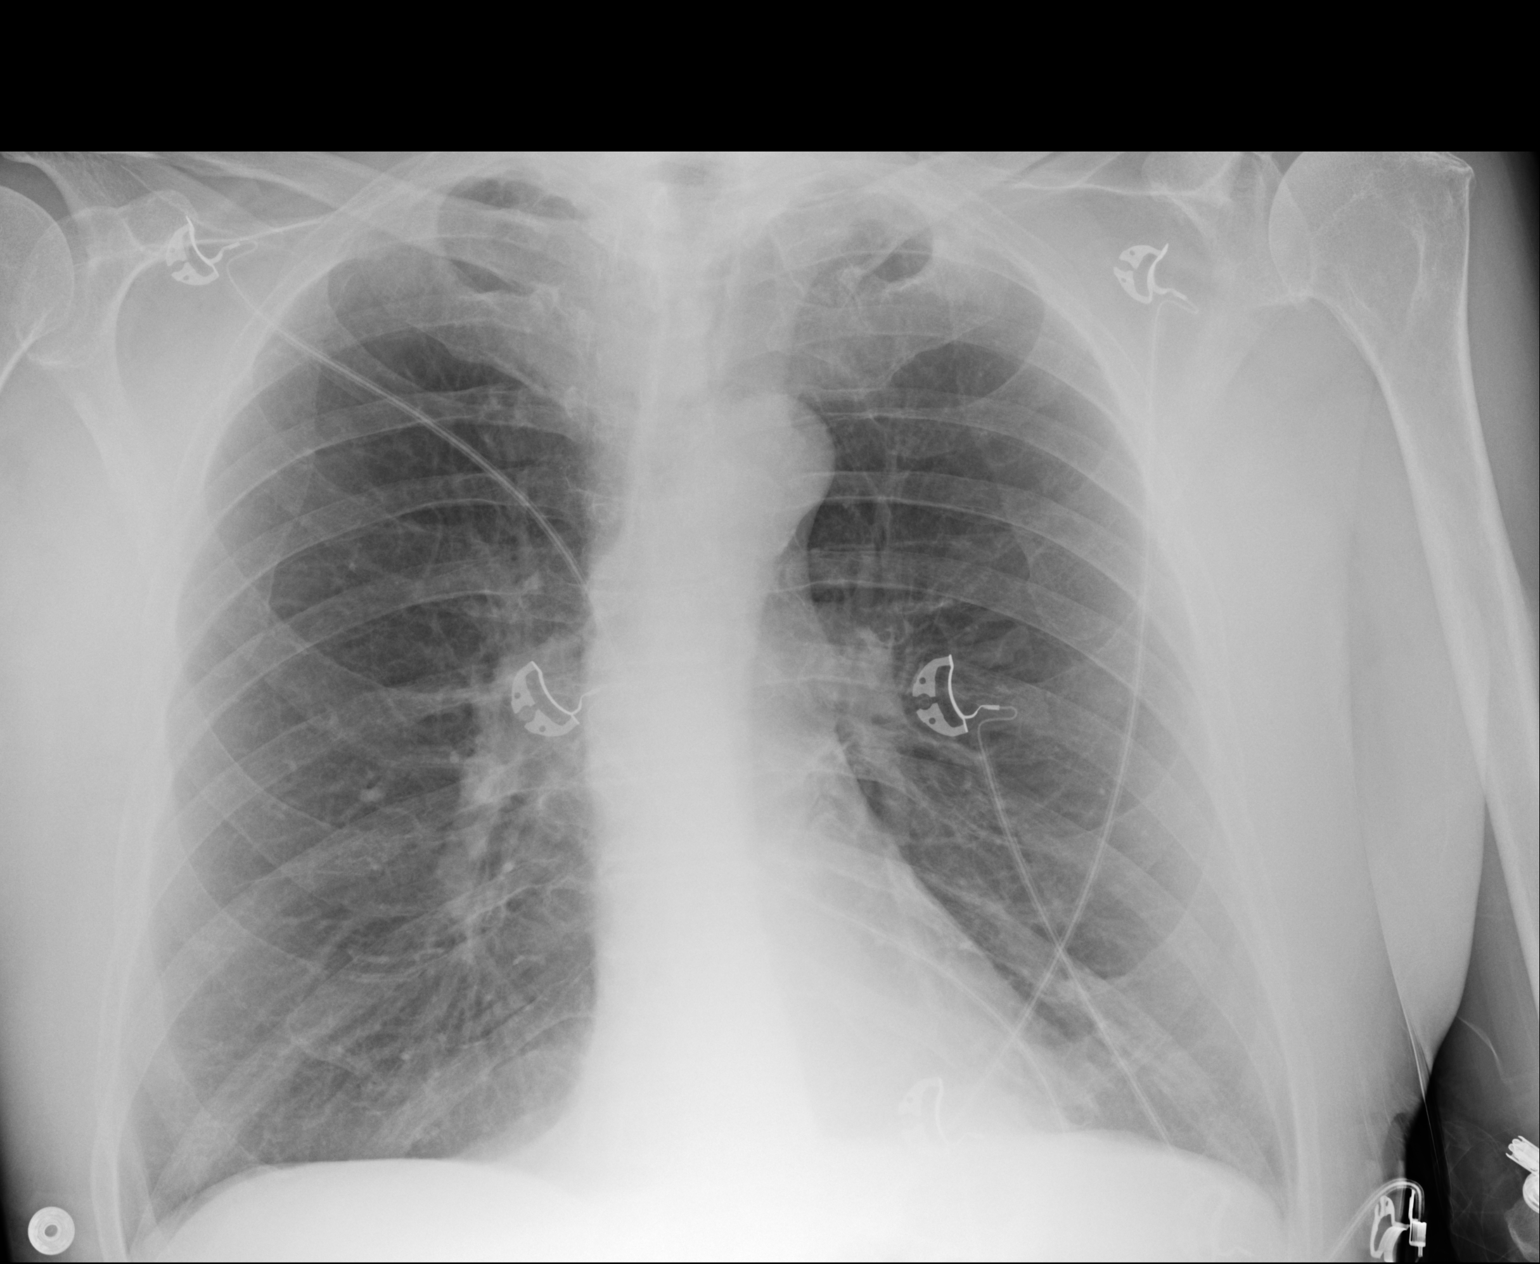
[im 2/2]
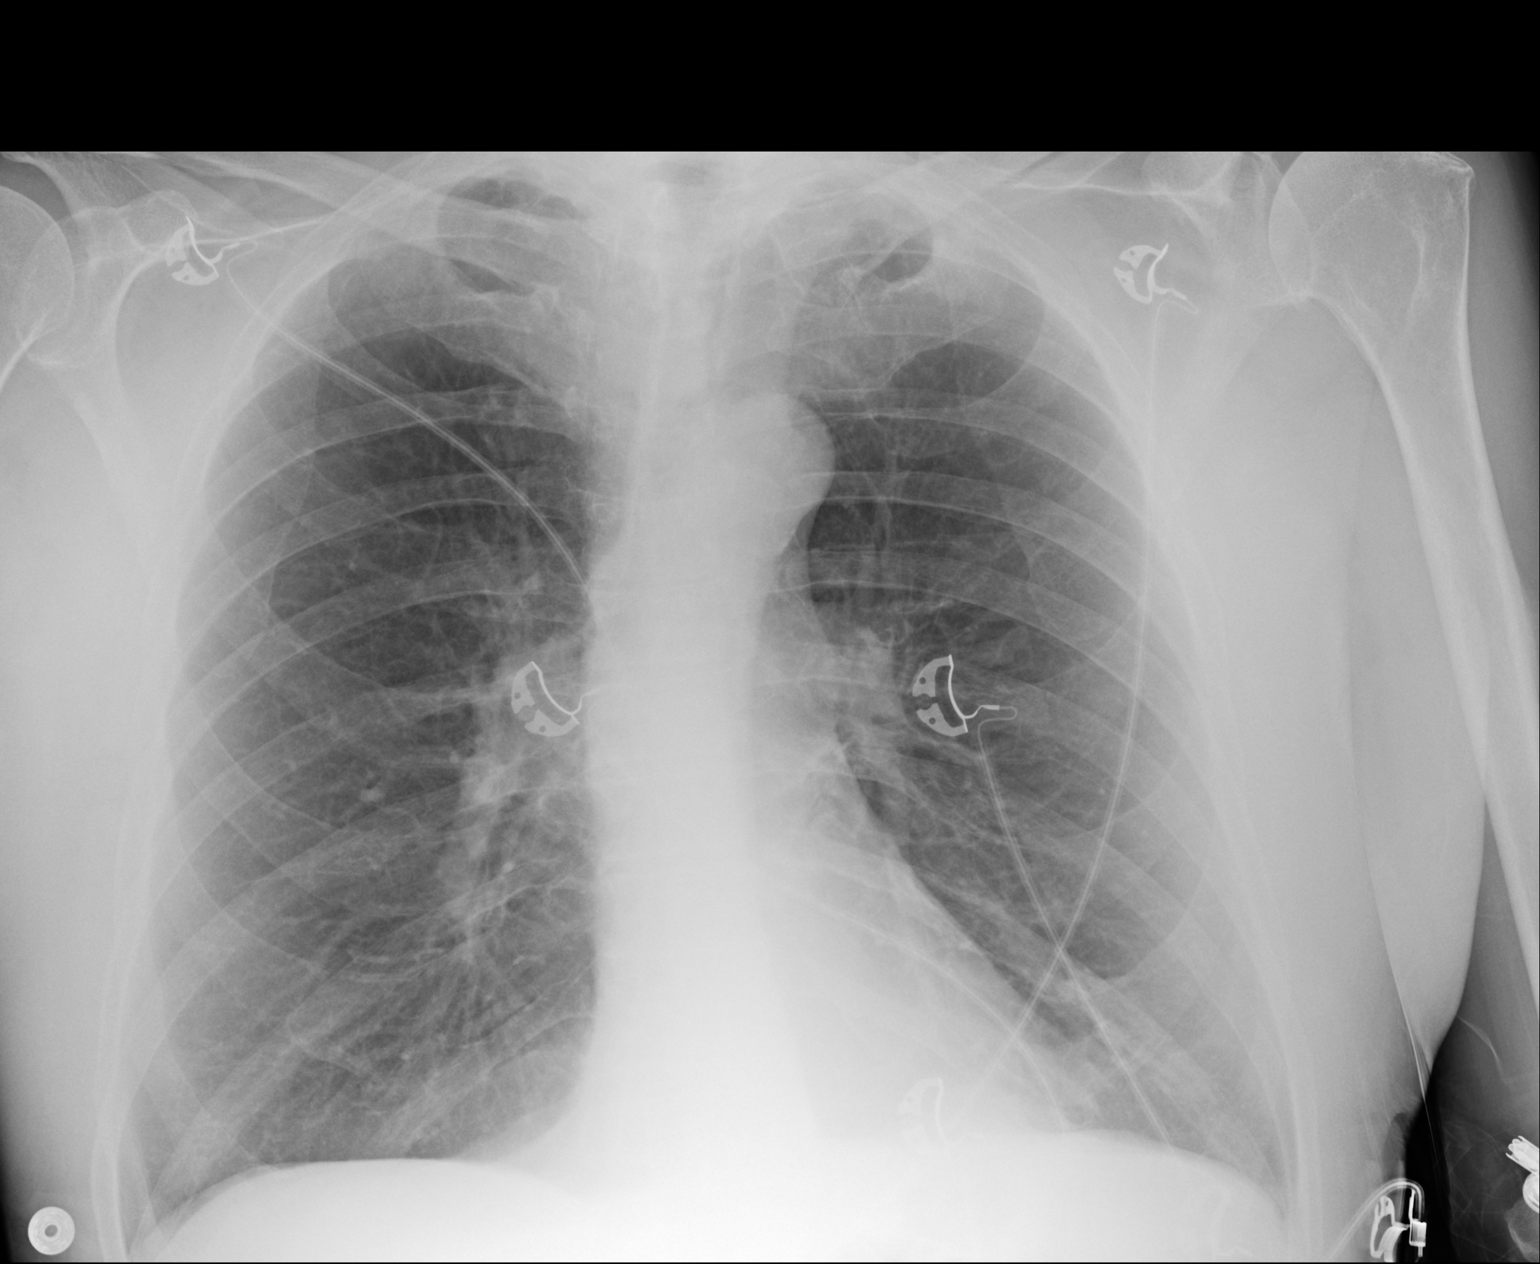

[2 of 2 positions shown; findings below may reference images not displayed]

FINDINGS: Stable emphysematous lung disease. There is no evidence of pulmonary
edema, consolidation, pneumothorax, nodule or pleural fluid. The
heart size and mediastinal contours are normal. Visualized bony
structures are unremarkable.
IMPRESSION: Stable emphysema.  No acute findings.

## 2019-04-24 MED ORDER — MAGNESIUM SULFATE 2 GM/50ML IV SOLN
2.0000 g | Freq: Once | INTRAVENOUS | Status: AC
  Administered 2019-04-24: 20:00:00 2 g via INTRAVENOUS
  Filled 2019-04-24: qty 50

## 2019-04-24 MED ORDER — MOMETASONE FURO-FORMOTEROL FUM 200-5 MCG/ACT IN AERO
2.0000 | INHALATION_SPRAY | Freq: Two times a day (BID) | RESPIRATORY_TRACT | Status: DC
  Administered 2019-04-24 – 2019-04-28 (×8): 2 via RESPIRATORY_TRACT
  Filled 2019-04-24 (×2): qty 8.8

## 2019-04-24 MED ORDER — ADULT MULTIVITAMIN W/MINERALS CH: 1.0000 | ORAL_TABLET | Freq: Every day | ORAL | Status: DC

## 2019-04-24 MED ORDER — ACETAMINOPHEN 325 MG PO TABS
650.0000 mg | ORAL_TABLET | Freq: Four times a day (QID) | ORAL | Status: DC | PRN
  Administered 2019-04-28: 650 mg via ORAL
  Filled 2019-04-24 (×2): qty 2

## 2019-04-24 MED ORDER — VITAMIN B-1 100 MG PO TABS
100.0000 mg | ORAL_TABLET | Freq: Every day | ORAL | Status: DC
  Administered 2019-04-24 – 2019-04-28 (×5): 100 mg via ORAL
  Filled 2019-04-24 (×5): qty 1

## 2019-04-24 MED ORDER — APIXABAN 5 MG PO TABS
5.0000 mg | ORAL_TABLET | Freq: Two times a day (BID) | ORAL | Status: DC
  Administered 2019-04-24 – 2019-04-28 (×8): 5 mg via ORAL
  Filled 2019-04-24 (×7): qty 1

## 2019-04-24 MED ORDER — FOLIC ACID 1 MG PO TABS
1.0000 mg | ORAL_TABLET | Freq: Every day | ORAL | Status: DC
  Administered 2019-04-24 – 2019-04-28 (×5): 1 mg via ORAL
  Filled 2019-04-24 (×5): qty 1

## 2019-04-24 MED ORDER — ALBUTEROL SULFATE (2.5 MG/3ML) 0.083% IN NEBU
2.5000 mg | INHALATION_SOLUTION | Freq: Four times a day (QID) | RESPIRATORY_TRACT | Status: DC | PRN
  Administered 2019-04-27: 06:00:00 2.5 mg via RESPIRATORY_TRACT
  Filled 2019-04-24: qty 3

## 2019-04-24 MED ORDER — NICOTINE 21 MG/24HR TD PT24
21.0000 mg | MEDICATED_PATCH | Freq: Every day | TRANSDERMAL | Status: DC
  Administered 2019-04-24 – 2019-04-28 (×5): 21 mg via TRANSDERMAL
  Filled 2019-04-24 (×5): qty 1

## 2019-04-24 MED ORDER — ONDANSETRON HCL 4 MG PO TABS: 4.0000 mg | ORAL_TABLET | Freq: Four times a day (QID) | ORAL | Status: DC | PRN

## 2019-04-24 MED ORDER — SODIUM CHLORIDE 0.9 % IV BOLUS
1000.0000 mL | Freq: Once | INTRAVENOUS | Status: AC
  Administered 2019-04-24: 10:00:00 1000 mL via INTRAVENOUS

## 2019-04-24 MED ORDER — FOLIC ACID 1 MG PO TABS: 1.0000 mg | ORAL_TABLET | Freq: Every day | ORAL | Status: DC

## 2019-04-24 MED ORDER — THIAMINE HCL 100 MG/ML IJ SOLN
100.0000 mg | Freq: Every day | INTRAMUSCULAR | Status: DC
  Filled 2019-04-24: qty 2

## 2019-04-24 MED ORDER — DILTIAZEM HCL 25 MG/5ML IV SOLN
10.0000 mg | Freq: Once | INTRAVENOUS | Status: AC
  Administered 2019-04-24: 10:00:00 10 mg via INTRAVENOUS
  Filled 2019-04-24: qty 5

## 2019-04-24 MED ORDER — POTASSIUM CHLORIDE CRYS ER 20 MEQ PO TBCR
40.0000 meq | EXTENDED_RELEASE_TABLET | Freq: Once | ORAL | Status: AC
  Administered 2019-04-24: 20:00:00 40 meq via ORAL
  Filled 2019-04-24: qty 2

## 2019-04-24 MED ORDER — DILTIAZEM HCL 100 MG IV SOLR
5.0000 mg/h | INTRAVENOUS | Status: DC
  Administered 2019-04-24: 5 mg/h via INTRAVENOUS
  Filled 2019-04-24: qty 100

## 2019-04-24 MED ORDER — ACETAMINOPHEN 650 MG RE SUPP: 650.0000 mg | Freq: Four times a day (QID) | RECTAL | Status: DC | PRN

## 2019-04-24 MED ORDER — POTASSIUM CHLORIDE CRYS ER 20 MEQ PO TBCR
40.0000 meq | EXTENDED_RELEASE_TABLET | Freq: Once | ORAL | Status: AC
  Administered 2019-04-24: 40 meq via ORAL
  Filled 2019-04-24: qty 2

## 2019-04-24 MED ORDER — ONDANSETRON HCL 4 MG/2ML IJ SOLN: 4.0000 mg | Freq: Four times a day (QID) | INTRAMUSCULAR | Status: DC | PRN

## 2019-04-24 MED ORDER — LORAZEPAM 2 MG/ML IJ SOLN
1.0000 mg | Freq: Four times a day (QID) | INTRAMUSCULAR | Status: AC | PRN
  Administered 2019-04-26: 19:00:00 1 mg via INTRAVENOUS
  Filled 2019-04-24: qty 1

## 2019-04-24 MED ORDER — SODIUM CHLORIDE 0.9 % IV BOLUS
1000.0000 mL | Freq: Once | INTRAVENOUS | Status: AC
  Administered 2019-04-24: 11:00:00 1000 mL via INTRAVENOUS

## 2019-04-24 MED ORDER — ADULT MULTIVITAMIN W/MINERALS CH
1.0000 | ORAL_TABLET | Freq: Every day | ORAL | Status: DC
  Administered 2019-04-24 – 2019-04-28 (×5): 1 via ORAL
  Filled 2019-04-24 (×5): qty 1

## 2019-04-24 MED ORDER — UMECLIDINIUM BROMIDE 62.5 MCG/INH IN AEPB
1.0000 | INHALATION_SPRAY | Freq: Every day | RESPIRATORY_TRACT | Status: DC
  Administered 2019-04-25 – 2019-04-28 (×4): 1 via RESPIRATORY_TRACT
  Filled 2019-04-24: qty 7

## 2019-04-24 MED ORDER — LORAZEPAM 1 MG PO TABS
1.0000 mg | ORAL_TABLET | Freq: Four times a day (QID) | ORAL | Status: AC | PRN
  Administered 2019-04-24 – 2019-04-27 (×8): 1 mg via ORAL
  Filled 2019-04-24 (×8): qty 1

## 2019-04-24 MED ORDER — SODIUM CHLORIDE 0.9 % IV SOLN
INTRAVENOUS | Status: DC
  Administered 2019-04-24 – 2019-04-25 (×3): via INTRAVENOUS

## 2019-04-24 NOTE — Progress Notes (Signed)
*  PRELIMINARY RESULTS* Echocardiogram 2D Echocardiogram has been performed.  Jacob Frank 04/24/2019, 12:56 PM
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
## 2019-04-24 NOTE — ED Notes (Signed)
ED Provider at bedside. 

## 2019-04-24 NOTE — ED Notes (Signed)
Date and time results received: 04/24/19 1015 (use smartphrase ".now" to insert current time)  Test: sodium Critical Value: 118  Name of Provider Notified: Dr. Roderic Palau  Orders Received? Or Actions Taken?: n/a
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
## 2019-04-24 NOTE — ED Triage Notes (Signed)
Pt comes in today with c/o several syncope episodes over the last 2 weeks. Pt also reports he has "been sick" over the last 3 months. Pt c/o nausea, dry heaves, generalized weakness for the past 3 months. Pt also reports he hasn't taken his BP medication in the last 4 days.

## 2019-04-25 ENCOUNTER — Other Ambulatory Visit: Payer: Self-pay | Admitting: Family Medicine

## 2019-04-25 DIAGNOSIS — F329 Major depressive disorder, single episode, unspecified: Secondary | ICD-10-CM

## 2019-04-25 DIAGNOSIS — R55 Syncope and collapse: Secondary | ICD-10-CM

## 2019-04-25 DIAGNOSIS — F419 Anxiety disorder, unspecified: Secondary | ICD-10-CM

## 2019-04-25 LAB — BASIC METABOLIC PANEL
Anion gap: 9 (ref 5–15)
BUN: 7 mg/dL — ABNORMAL LOW (ref 8–23)
CO2: 25 mmol/L (ref 22–32)
Calcium: 8.1 mg/dL — ABNORMAL LOW (ref 8.9–10.3)
Chloride: 93 mmol/L — ABNORMAL LOW (ref 98–111)
Creatinine, Ser: 0.67 mg/dL (ref 0.61–1.24)
GFR calc Af Amer: >60 mL/min (ref >60)
GFR calc non Af Amer: >60 mL/min (ref >60)
Glucose, Bld: 85 mg/dL (ref 70–99)
Potassium: 3.6 mmol/L (ref 3.5–5.1)
Sodium: 127 mmol/L — ABNORMAL LOW (ref 135–145)

## 2019-04-25 LAB — CBC
HCT: 38.1 % — ABNORMAL LOW (ref 39.0–52.0)
Hemoglobin: 13.2 g/dL (ref 13.0–17.0)
MCH: 33.2 pg (ref 26.0–34.0)
MCHC: 34.6 g/dL (ref 30.0–36.0)
MCV: 95.7 fL (ref 80.0–100.0)
Platelets: 161 10*3/uL (ref 150–400)
RBC: 3.98 MIL/uL — ABNORMAL LOW (ref 4.22–5.81)
RDW: 12.9 % (ref 11.5–15.5)
WBC: 5.9 10*3/uL (ref 4.0–10.5)
nRBC: 0 % (ref 0.0–0.2)

## 2019-04-25 LAB — HIV ANTIBODY (ROUTINE TESTING W REFLEX): HIV Screen 4th Generation wRfx: NONREACTIVE

## 2019-04-25 LAB — MAGNESIUM: Magnesium: 2 mg/dL (ref 1.7–2.4)

## 2019-04-25 MED ORDER — CHLORHEXIDINE GLUCONATE CLOTH 2 % EX PADS
6.0000 | MEDICATED_PAD | Freq: Every day | CUTANEOUS | Status: DC
  Administered 2019-04-25 – 2019-04-26 (×2): 6 via TOPICAL

## 2019-04-25 MED ORDER — METOPROLOL TARTRATE 25 MG PO TABS
25.0000 mg | ORAL_TABLET | Freq: Two times a day (BID) | ORAL | Status: DC
  Administered 2019-04-25 – 2019-04-26 (×3): 25 mg via ORAL
  Filled 2019-04-25 (×3): qty 1

## 2019-04-25 NOTE — Progress Notes (Signed)
PROGRESS NOTE    TAYVIN PRESLAR  ZOX:[id number] DOB: 1951-02-14 DOA: 04/24/2019 PCP: [name], MD    Brief Narrative:  68 year old male with a history of anxiety, depression, COPD, alcohol abuse, presents to the hospital with weakness and syncope.  Found to be significantly hyponatremic with a sodium of 118 and had new onset rapid atrial fibrillation.  Admitted for IV fluid hydration as well as management of rapid heart rate.   Assessment & Plan:   Principal Problem:   Syncope and collapse Active Problems:   COPD (chronic obstructive pulmonary disease) (HCC)   HTN (hypertension), benign   Tobacco dependence   Hyponatremia   Anxiety and depression   Alcohol abuse   1. Syncope and collapse.  Likely related to hypovolemia.  Symptoms have improved with IV hydration.  Echocardiogram unremarkable.  Cortisol and TSH also unrevealing.  Clinically, appears to be improving 2. Atrial fibrillation with rapid ventricular response.  Was initially on Cardizem drip which has since been discontinued.  Heart rate has trended back up.  Will start on oral Lopressor.  Troponins negative.  Anticoagulated with Eliquis. 3. Hyponatremia.  Likely related to beer potomania.  Improving with IV hydration.  Will discontinue Foley saline so as to not rapidly correct sodium. 4. Alcohol abuse.  No signs of alcohol withdrawal at this time.  Continue to monitor on CIWA protocol. 5. COPD.  No shortness of breath or wheezing at this time.  Continue bronchodilators as needed. 6. Hypertension.  Blood pressure currently stable.  Continue to monitor since he has been started on beta-blockers. 7.    DVT prophylaxis: Apixaban Code Status: Full code Family Communication: None Disposition Plan: Discharge home once heart rate is improved   Consultants:     Procedures:     Antimicrobials:      Subjective: Denies any chest pain or shortness of breath.  Still feels generally weak  Objective: Vitals:   04/25/19 0400 04/25/19 0500 04/25/19 0600 04/25/19 0800  BP: 115/88 (!) 113/94 112/87   Pulse: 93 99 95   Resp: 18 20 (!) 23   Temp:    97.9 F (36.6 C)  TempSrc:    Oral  SpO2: 96% 94% 95%   Weight:      Height:        Intake/Output Summary (Last 24 hours) at 04/25/2019 0859 Last data filed at 04/25/2019 0820 Gross per 24 hour  Intake 4404.63 ml  Output 3951 ml  Net 453.63 ml   Filed Weights   04/24/19 0921 04/24/19 1330  Weight: 72.6 kg 77.4 kg    Examination:  General exam: Appears calm and comfortable  Respiratory system: Clear to auscultation. Respiratory effort normal. Cardiovascular system: S1 & S2 heard, irregular, tachycardic. No JVD, murmurs, rubs, gallops or clicks. No pedal edema. Gastrointestinal system: Abdomen is nondistended, soft and nontender. No organomegaly or masses felt. Normal bowel sounds heard. Central nervous system: Alert and oriented. No focal neurological deficits. Extremities: Symmetric 5 x 5 power. Skin: No rashes, lesions or ulcers Psychiatry: Judgement and insight appear normal. Mood & affect appropriate.     Data Reviewed: I have personally reviewed following labs and imaging studies  CBC: Recent Labs  Lab 04/24/19 0928 04/25/19 0404  WBC 8.4 5.9  NEUTROABS 6.6  --   HGB 14.9 13.2  HCT 41.3 38.1*  MCV 92.0 95.7  PLT 162 811   Basic Metabolic Panel: Recent Labs  Lab 04/24/19 0928 04/24/19 1212 04/25/19 0404  NA 118*  --  127*  K 3.1*  --  3.6  CL 77*  --  93*  CO2 25  --  25  GLUCOSE 99  --  85  BUN 8  --  7*  CREATININE 0.75  --  0.67  CALCIUM 9.1  --  8.1*  MG  --  1.7 2.0  PHOS  --  3.4  --    GFR: Estimated Creatinine Clearance: 90.8 mL/min (by C-G formula based on SCr of 0.67 mg/dL). Liver Function Tests: Recent Labs  Lab 04/24/19 0928  AST 60*  ALT 104*  ALKPHOS 38  BILITOT 1.9*  PROT 7.1  ALBUMIN 4.5   No results for input(s): LIPASE, AMYLASE in the last 168 hours. No results for input(s): AMMONIA  in the last 168 hours. Coagulation Profile: No results for input(s): INR, PROTIME in the last 168 hours. Cardiac Enzymes: No results for input(s): CKTOTAL, CKMB, CKMBINDEX, TROPONINI in the last 168 hours. BNP (last 3 results) No results for input(s): PROBNP in the last 8760 hours. HbA1C: No results for input(s): HGBA1C in the last 72 hours. CBG: No results for input(s): GLUCAP in the last 168 hours. Lipid Profile: No results for input(s): CHOL, HDL, LDLCALC, TRIG, CHOLHDL, LDLDIRECT in the last 72 hours. Thyroid Function Tests: Recent Labs    04/24/19 1212  TSH 0.731   Anemia Panel: No results for input(s): VITAMINB12, FOLATE, FERRITIN, TIBC, IRON, RETICCTPCT in the last 72 hours. Sepsis Labs: No results for input(s): PROCALCITON, LATICACIDVEN in the last 168 hours.  Recent Results (from the past 240 hour(s))  SARS Coronavirus 2 (CEPHEID - Performed in [facility] hospital lab), Hosp Order     Status: None   Collection Time: 04/24/19 10:31 AM   Specimen: Nasopharyngeal Swab  Result Value Ref Range Status   SARS Coronavirus 2 NEGATIVE NEGATIVE Final    Comment: (NOTE) If result is NEGATIVE SARS-CoV-2 target nucleic acids are NOT DETECTED. The SARS-CoV-2 RNA is generally detectable in upper and lower  respiratory specimens during the acute phase of infection. The lowest  concentration of SARS-CoV-2 viral copies this assay can detect is 250  copies / mL. A negative result does not preclude SARS-CoV-2 infection  and should not be used as the sole basis for treatment or other  patient management decisions.  A negative result may occur with  improper specimen collection / handling, submission of specimen other  than nasopharyngeal swab, presence of viral mutation(s) within the  areas targeted by this assay, and inadequate number of viral copies  (<250 copies / mL). A negative result must be combined with clinical  observations, patient history, and epidemiological information.  If result is POSITIVE SARS-CoV-2 target nucleic acids are DETECTED. The SARS-CoV-2 RNA is generally detectable in upper and lower  respiratory specimens dur ing the acute phase of infection.  Positive  results are indicative of active infection with SARS-CoV-2.  Clinical  correlation with patient history and other diagnostic information is  necessary to determine patient infection status.  Positive results do  not rule out bacterial infection or co-infection with other viruses. If result is PRESUMPTIVE POSTIVE SARS-CoV-2 nucleic acids MAY BE PRESENT.   A presumptive positive result was obtained on the submitted specimen  and confirmed on repeat testing.  While 2019 novel coronavirus  (SARS-CoV-2) nucleic acids may be present in the submitted sample  additional confirmatory testing may be necessary for epidemiological  and / or clinical management purposes  to differentiate between  SARS-CoV-2 and other Sarbecovirus currently known  to infect humans.  If clinically indicated additional testing with an alternate test  methodology [phone]) is advised. The SARS-CoV-2 RNA is generally  detectable in upper and lower respiratory sp ecimens during the acute  phase of infection. The expected result is Negative. Fact Sheet for Patients:  StrictlyIdeas.no Fact Sheet for Healthcare Providers: BankingDealers.co.za This test is not yet approved or cleared by the [place] FDA and has been authorized for detection and/or diagnosis of SARS-CoV-2 by FDA under an Emergency Use Authorization (EUA).  This EUA will remain in effect (meaning this test can be used) for the duration of the COVID-19 declaration under Section 564(b)(1) of the Act, 21 U.S.C. section 360bbb-3(b)(1), unless the authorization is terminated or revoked sooner. Performed at [facility], [address] [place], [place] [address]   MRSA PCR Screening     Status: None   Collection  Time: 04/24/19  1:44 PM   Specimen: Nasal Mucosa; Nasopharyngeal  Result Value Ref Range Status   MRSA by PCR NEGATIVE NEGATIVE Final    Comment:        The GeneXpert MRSA Assay (FDA approved for NASAL specimens only), is one component of a comprehensive MRSA colonization surveillance program. It is not intended to diagnose MRSA infection nor to guide or monitor treatment for MRSA infections. Performed at [facility], [address] [place], [place] [address]          Radiology Studies: Dg Chest Portable 1 View  Result Date: 04/24/2019 CLINICAL DATA:  Syncopal episodes, nausea and generalized weakness. EXAM: PORTABLE CHEST 1 VIEW COMPARISON:  09/18/2018 FINDINGS: Stable emphysematous lung disease. There is no evidence of pulmonary edema, consolidation, pneumothorax, nodule or pleural fluid. The heart size and mediastinal contours are normal. Visualized bony structures are unremarkable. IMPRESSION: Stable emphysema.  No acute findings. Electronically Signed   By: Aletta Edouard M.D.   On: 04/24/2019 09:55        Scheduled Meds: . apixaban  5 mg Oral BID  . Chlorhexidine Gluconate Cloth  6 each Topical Daily  . folic acid  1 mg Oral Daily  . metoprolol tartrate  25 mg Oral BID  . mometasone-formoterol  2 puff Inhalation BID  . multivitamin with minerals  1 tablet Oral Daily  . nicotine  21 mg Transdermal Daily  . thiamine  100 mg Oral Daily   Or  . thiamine  100 mg Intravenous Daily  . umeclidinium bromide  1 puff Inhalation Daily   Continuous Infusions: . diltiazem (CARDIZEM) infusion Stopped (04/24/19 1527)     LOS: 1 day    Time spent: 59mins    Kathie Dike, MD [facility]   If 7PM-7AM, please contact night-coverage [email or web address]  04/25/2019, 8:59 AM

## 2019-04-26 LAB — CBC
HCT: 37.2 % — ABNORMAL LOW (ref 39.0–52.0)
Hemoglobin: 13 g/dL (ref 13.0–17.0)
MCH: 33.2 pg (ref 26.0–34.0)
MCHC: 34.9 g/dL (ref 30.0–36.0)
MCV: 95.1 fL (ref 80.0–100.0)
Platelets: 165 10*3/uL (ref 150–400)
RBC: 3.91 MIL/uL — ABNORMAL LOW (ref 4.22–5.81)
RDW: 13 % (ref 11.5–15.5)
WBC: 5.4 10*3/uL (ref 4.0–10.5)
nRBC: 0 % (ref 0.0–0.2)

## 2019-04-26 LAB — BASIC METABOLIC PANEL WITH GFR
Anion gap: 11 (ref 5–15)
BUN: 10 mg/dL (ref 8–23)
CO2: 24 mmol/L (ref 22–32)
Calcium: 8.6 mg/dL — ABNORMAL LOW (ref 8.9–10.3)
Chloride: 92 mmol/L — ABNORMAL LOW (ref 98–111)
Creatinine, Ser: 0.82 mg/dL (ref 0.61–1.24)
GFR calc Af Amer: >60 mL/min (ref >60)
GFR calc non Af Amer: >60 mL/min (ref >60)
Glucose, Bld: 89 mg/dL (ref 70–99)
Potassium: 2.8 mmol/L — ABNORMAL LOW (ref 3.5–5.1)
Sodium: 127 mmol/L — ABNORMAL LOW (ref 135–145)

## 2019-04-26 LAB — MAGNESIUM: Magnesium: 1.9 mg/dL (ref 1.7–2.4)

## 2019-04-26 MED ORDER — METOPROLOL TARTRATE 50 MG PO TABS
50.0000 mg | ORAL_TABLET | Freq: Two times a day (BID) | ORAL | Status: DC
  Administered 2019-04-27 (×2): 50 mg via ORAL
  Filled 2019-04-26: qty 2

## 2019-04-26 MED ORDER — SODIUM CHLORIDE 0.9 % IV SOLN
INTRAVENOUS | Status: DC
  Administered 2019-04-26: 15:00:00 via INTRAVENOUS

## 2019-04-26 MED ORDER — CYANOCOBALAMIN 1000 MCG/ML IJ SOLN
1000.0000 ug | Freq: Once | INTRAMUSCULAR | Status: AC
  Administered 2019-04-26: 18:00:00 1000 ug via INTRAMUSCULAR
  Filled 2019-04-26: qty 1

## 2019-04-26 MED ORDER — POTASSIUM CHLORIDE CRYS ER 20 MEQ PO TBCR
40.0000 meq | EXTENDED_RELEASE_TABLET | ORAL | Status: AC
  Administered 2019-04-26 (×2): 40 meq via ORAL
  Filled 2019-04-26: qty 2

## 2019-04-26 NOTE — Progress Notes (Signed)
PROGRESS NOTE    Jacob Frank  PNT:[id number] DOB: 26-Aug-1951 DOA: 04/24/2019 PCP: [name], MD    Brief Narrative:  68 y.o. male with PMH significant for anxiety.depression, COPD, alcohol abuse, tobacco abuse and HTN; who presented to ED secondary to weakness and syncope. Patient symptoms has been intermittently present for the last 2 weeks and worsening. Patient reports decrease oral intake and poor appetite. No CP, no SOB, no sick contacts, no fever, no chills, no hematuria, no dysuria, no melena, no hematochezia and no focal weakness.  In the ED workup demonstrated severe hyponatremia (Na 118), mild hypokalemia, hyponatremia and was found to be on A. Fib (HR 140's range). Neg troponin and no acute cardiopulmonary process on CXR. Patient received IVF's and was started on cardizem drip. TRH called to admit patient for further evaluation and management.  Assessment & Plan:   Principal Problem:   Syncope and collapse Active Problems:   COPD (chronic obstructive pulmonary disease) (HCC)   HTN (hypertension), benign   Tobacco dependence   Hyponatremia   Anxiety and depression   Alcohol abuse   1. Syncope and collapse.  Likely related to hypovolemia.  Symptoms have improved with IV hydration.  Echocardiogram unremarkable.  Cortisol and TSH also unrevealing.  Continue clinically improving. 2. Atrial fibrillation with rapid ventricular response.  Was initially on Cardizem drip which has since been discontinued.  Heart rate still elevated intermittently; will continue Lopressor but adjust dose for better control.  Continue Eliquis for secondary prevention.  Will follow cardiology recommendations.  2D echo with preserved ejection fraction and no significant valvular abnormalities.  3. Hyponatremia.  Likely related to beer potomania, dehydration poor oral intake. Improving with IV hydration.  Continue monitoring electrolytes trend. 4. Alcohol abuse.  No signs of alcohol withdrawal at this  time.  Continue to monitor on CIWA protocol.  Continue thiamine and folic acid. 5. COPD.  No shortness of breath or wheezing at this time.  Continue bronchodilators as needed. 6. Hypertension.  Blood pressure currently stable.  Continue to monitor since he has been started on beta-blockers. 7. Tobacco abuse: Cessation counseling has been provided.  Continue nicotine patch. 8. Anxiety and depression: Mood overall stable.  Was not taking any medications prior to admission. Receiving as needed ativan through CIWA protocol.   DVT prophylaxis: Apixaban Code Status: Full code Family Communication: None Disposition Plan: Discharge home once heart rate is improved   Consultants:   Cardiology   Procedures:   2-D echo  See below for x-ray reports.   Antimicrobials:   None    Subjective: Afebrile, no chest pain, no nausea, no vomiting.  Patient reports palpitations and is still feeling weak.   Objective: Vitals:   04/26/19 0800 04/26/19 0844 04/26/19 0900 04/26/19 1100  BP: (!) 123/108 (!) 123/108    Pulse: (!) 102 (!) 101 (!) 105   Resp: (!) 23  (!) 21   Temp:    (!) 97.3 F (36.3 C)  TempSrc:    Oral  SpO2: (!) 87%  (!) 86%   Weight:      Height:        Intake/Output Summary (Last 24 hours) at 04/26/2019 1413 Last data filed at 04/26/2019 1112 Gross per 24 hour  Intake 360 ml  Output 1000 ml  Net -640 ml   Filed Weights   04/24/19 0921 04/24/19 1330 04/26/19 0600  Weight: 72.6 kg 77.4 kg 77.5 kg    Examination: General exam: Alert, awake, oriented x 3;  slightly anxious and jittery; denies chest pain and shortness of breath.  Reports palpitations. Respiratory system: Clear to auscultation. Respiratory effort normal.  Good oxygen saturation on room air. Cardiovascular system: Irregular, positive tachycardia on exam.  No murmurs, no rubs, no gallops. Gastrointestinal system: Abdomen is nondistended, soft and nontender. No organomegaly or masses felt. Normal bowel sounds  heard. Central nervous system: Alert and oriented. No focal neurological deficits. Extremities: No C/C/E, +pedal pulses Skin: No rashes, lesions or ulcers Psychiatry: Judgement and insight appear normal. Mood & affect appropriate.    Data Reviewed: I have personally reviewed following labs and imaging studies  CBC: Recent Labs  Lab 04/24/19 0928 04/25/19 0404 04/26/19 0413  WBC 8.4 5.9 5.4  NEUTROABS 6.6  --   --   HGB 14.9 13.2 13.0  HCT 41.3 38.1* 37.2*  MCV 92.0 95.7 95.1  PLT 162 161 557   Basic Metabolic Panel: Recent Labs  Lab 04/24/19 0928 04/24/19 1212 04/25/19 0404 04/26/19 0413 04/26/19 0521  NA 118*  --  127* 127*  --   K 3.1*  --  3.6 2.8*  --   CL 77*  --  93* 92*  --   CO2 25  --  25 24  --   GLUCOSE 99  --  85 89  --   BUN 8  --  7* 10  --   CREATININE 0.75  --  0.67 0.82  --   CALCIUM 9.1  --  8.1* 8.6*  --   MG  --  1.7 2.0  --  1.9  PHOS  --  3.4  --   --   --    GFR: Estimated Creatinine Clearance: 88.6 mL/min (by C-G formula based on SCr of 0.82 mg/dL).   Liver Function Tests: Recent Labs  Lab 04/24/19 0928  AST 60*  ALT 104*  ALKPHOS 38  BILITOT 1.9*  PROT 7.1  ALBUMIN 4.5   Thyroid Function Tests: Recent Labs    04/24/19 1212  TSH 0.731    Recent Results (from the past 240 hour(s))  SARS Coronavirus 2 (CEPHEID - Performed in [facility] hospital lab), Hosp Order     Status: None   Collection Time: 04/24/19 10:31 AM   Specimen: Nasopharyngeal Swab  Result Value Ref Range Status   SARS Coronavirus 2 NEGATIVE NEGATIVE Final    Comment: (NOTE) If result is NEGATIVE SARS-CoV-2 target nucleic acids are NOT DETECTED. The SARS-CoV-2 RNA is generally detectable in upper and lower  respiratory specimens during the acute phase of infection. The lowest  concentration of SARS-CoV-2 viral copies this assay can detect is 250  copies / mL. A negative result does not preclude SARS-CoV-2 infection  and should not be used as the sole basis  for treatment or other  patient management decisions.  A negative result may occur with  improper specimen collection / handling, submission of specimen other  than nasopharyngeal swab, presence of viral mutation(s) within the  areas targeted by this assay, and inadequate number of viral copies  (<250 copies / mL). A negative result must be combined with clinical  observations, patient history, and epidemiological information. If result is POSITIVE SARS-CoV-2 target nucleic acids are DETECTED. The SARS-CoV-2 RNA is generally detectable in upper and lower  respiratory specimens dur ing the acute phase of infection.  Positive  results are indicative of active infection with SARS-CoV-2.  Clinical  correlation with patient history and other diagnostic information is  necessary to determine patient infection  status.  Positive results do  not rule out bacterial infection or co-infection with other viruses. If result is PRESUMPTIVE POSTIVE SARS-CoV-2 nucleic acids MAY BE PRESENT.   A presumptive positive result was obtained on the submitted specimen  and confirmed on repeat testing.  While 2019 novel coronavirus  (SARS-CoV-2) nucleic acids may be present in the submitted sample  additional confirmatory testing may be necessary for epidemiological  and / or clinical management purposes  to differentiate between  SARS-CoV-2 and other Sarbecovirus currently known to infect humans.  If clinically indicated additional testing with an alternate test  methodology [phone]) is advised. The SARS-CoV-2 RNA is generally  detectable in upper and lower respiratory sp ecimens during the acute  phase of infection. The expected result is Negative. Fact Sheet for Patients:  StrictlyIdeas.no Fact Sheet for Healthcare Providers: BankingDealers.co.za This test is not yet approved or cleared by the [place] FDA and has been authorized for detection and/or  diagnosis of SARS-CoV-2 by FDA under an Emergency Use Authorization (EUA).  This EUA will remain in effect (meaning this test can be used) for the duration of the COVID-19 declaration under Section 564(b)(1) of the Act, 21 U.S.C. section 360bbb-3(b)(1), unless the authorization is terminated or revoked sooner. Performed at [facility], [address], [place], [place] [address]   MRSA PCR Screening     Status: None   Collection Time: 04/24/19  1:44 PM   Specimen: Nasal Mucosa; Nasopharyngeal  Result Value Ref Range Status   MRSA by PCR NEGATIVE NEGATIVE Final    Comment:        The GeneXpert MRSA Assay (FDA approved for NASAL specimens only), is one component of a comprehensive MRSA colonization surveillance program. It is not intended to diagnose MRSA infection nor to guide or monitor treatment for MRSA infections. Performed at [facility], [address] [place], [place] [address]      Scheduled Meds: . apixaban  5 mg Oral BID  . Chlorhexidine Gluconate Cloth  6 each Topical Daily  . folic acid  1 mg Oral Daily  . metoprolol tartrate  50 mg Oral BID  . mometasone-formoterol  2 puff Inhalation BID  . multivitamin with minerals  1 tablet Oral Daily  . nicotine  21 mg Transdermal Daily  . thiamine  100 mg Oral Daily   Or  . thiamine  100 mg Intravenous Daily  . umeclidinium bromide  1 puff Inhalation Daily   Continuous Infusions: . sodium chloride       LOS: 2 days    Time spent: 30 mins    Barton Dubois, MD [facility] [phone]   04/26/2019, 2:13 PM

## 2019-04-27 ENCOUNTER — Telehealth: Payer: Self-pay | Admitting: Family Medicine

## 2019-04-27 ENCOUNTER — Encounter (HOSPITAL_COMMUNITY): Payer: Self-pay | Admitting: Cardiology

## 2019-04-27 DIAGNOSIS — J449 Chronic obstructive pulmonary disease, unspecified: Secondary | ICD-10-CM

## 2019-04-27 DIAGNOSIS — E871 Hypo-osmolality and hyponatremia: Secondary | ICD-10-CM

## 2019-04-27 DIAGNOSIS — I4819 Other persistent atrial fibrillation: Secondary | ICD-10-CM

## 2019-04-27 DIAGNOSIS — F1010 Alcohol abuse, uncomplicated: Secondary | ICD-10-CM

## 2019-04-27 DIAGNOSIS — F17200 Nicotine dependence, unspecified, uncomplicated: Secondary | ICD-10-CM

## 2019-04-27 LAB — BASIC METABOLIC PANEL
Anion gap: 9 (ref 5–15)
BUN: 8 mg/dL (ref 8–23)
CO2: 25 mmol/L (ref 22–32)
Calcium: 8.5 mg/dL — ABNORMAL LOW (ref 8.9–10.3)
Chloride: 99 mmol/L (ref 98–111)
Creatinine, Ser: 0.75 mg/dL (ref 0.61–1.24)
GFR calc Af Amer: >60 mL/min (ref >60)
GFR calc non Af Amer: >60 mL/min (ref >60)
Glucose, Bld: 89 mg/dL (ref 70–99)
Potassium: 3.5 mmol/L (ref 3.5–5.1)
Sodium: 133 mmol/L — ABNORMAL LOW (ref 135–145)

## 2019-04-27 LAB — MAGNESIUM: Magnesium: 1.8 mg/dL (ref 1.7–2.4)

## 2019-04-27 MED ORDER — ALUM & MAG HYDROXIDE-SIMETH 200-200-20 MG/5ML PO SUSP
30.0000 mL | Freq: Four times a day (QID) | ORAL | Status: DC | PRN
  Administered 2019-04-27: 19:00:00 30 mL via ORAL
  Filled 2019-04-27: qty 30

## 2019-04-27 MED ORDER — METOPROLOL TARTRATE 50 MG PO TABS
75.0000 mg | ORAL_TABLET | Freq: Two times a day (BID) | ORAL | Status: DC
  Administered 2019-04-27: 75 mg via ORAL
  Filled 2019-04-27 (×2): qty 1

## 2019-04-27 NOTE — Evaluation (Signed)
Physical Therapy Evaluation Patient Details Name: Jacob Frank MRN: [id number] DOB: Feb 10, 1951 Today's Date: 04/27/2019   History of Present Illness  Jacob Frank is a 68 y.o. male with PMH significant for anxiety.depression, COPD, alcohol abuse, tobacco abuse and HTN; who presented to ED secondary to weakness and syncope. Patient symptoms has been intermittently present for the last 2 weeks and worsening. Patient reports decrease oral intake and poor appetite. No CP, no SOB, no sick contacts, no fever, no chills, no hematuria, no dysuria, no melena, no hematochezia and no focal weakness. In the ED workup demonstrated severe hyponatremia (Na 118), mild hypokalemia, hyponatremia and was found to be on A. Fib (HR 140's range). Neg troponin and no acute cardiopulmonary process on CXR.Patient received IVF's and was started on cardizem drip. TRH called to admit patient for further evaluation and management    Clinical Impression  Patient functioning at baseline for functional mobility and gait.  Plan:  Patient discharged from physical therapy to care of nursing for ambulation daily as tolerated for length of stay.    Follow Up Recommendations Home health PT;Supervision - Intermittent    Equipment Recommendations  None recommended by PT    Recommendations for Other Services       Precautions / Restrictions Precautions Precautions: None Restrictions Weight Bearing Restrictions: No      Mobility  Bed Mobility Overal bed mobility: Modified Independent             General bed mobility comments: increased time  Transfers Overall transfer level: Modified independent Equipment used: Straight cane             General transfer comment: increased time  Ambulation/Gait Ambulation/Gait assistance: Modified independent (Device/Increase time) Gait Distance (Feet): 200 Feet Assistive device: Straight cane Gait Pattern/deviations: Step-through pattern;WFL(Within Functional Limits) Gait  velocity: decreased   General Gait Details: demonstrates good return for ambulation on level, inclined and declined surfaces without loss of balance  Stairs            Wheelchair Mobility    Modified Rankin (Stroke Patients Only)       Balance Overall balance assessment: Needs assistance Sitting-balance support: Feet supported;No upper extremity supported Sitting balance-Leahy Scale: Good Sitting balance - Comments: seated at bedside   Standing balance support: During functional activity;Single extremity supported Standing balance-Leahy Scale: Fair Standing balance comment: fair/good using SPC                             Pertinent Vitals/Pain Pain Assessment: No/denies pain    Home Living Family/patient expects to be discharged to:: Private residence Living Arrangements: Non-relatives/Friends Available Help at Discharge: Friend(s) Type of Home: Apartment Home Access: Stairs to enter Entrance Stairs-Rails: Right;Left;Can reach both Technical brewer of Steps: 3 Home Layout: One level Home Equipment: Cane - single point      Prior Function Level of Independence: Independent with assistive device(s)         Comments: household and short distanced community ambulator using [facility]     Hand Dominance        Extremity/Trunk Assessment   Upper Extremity Assessment Upper Extremity Assessment: Overall WFL for tasks assessed    Lower Extremity Assessment Lower Extremity Assessment: Overall WFL for tasks assessed    Cervical / Trunk Assessment Cervical / Trunk Assessment: Normal  Communication   Communication: No difficulties  Cognition Arousal/Alertness: Awake/alert Behavior During Therapy: WFL for tasks assessed/performed Overall Cognitive Status: Within Functional Limits  for tasks assessed                                        General Comments      Exercises     Assessment/Plan    PT Assessment All further PT  needs can be met in the next venue of care  PT Problem List Decreased strength;Decreased activity tolerance;Decreased mobility       PT Treatment Interventions      PT Goals (Current goals can be found in the Care Plan section)  Acute Rehab PT Goals Patient Stated Goal: return home with friend to assist PT Goal Formulation: With patient Time For Goal Achievement: 04/27/19 Potential to Achieve Goals: Good    Frequency     Barriers to discharge        Co-evaluation               AM-PAC PT "6 Clicks" Mobility  Outcome Measure Help needed turning from your back to your side while in a flat bed without using bedrails?: None Help needed moving from lying on your back to sitting on the side of a flat bed without using bedrails?: None Help needed moving to and from a bed to a chair (including a wheelchair)?: None Help needed standing up from a chair using your arms (e.g., wheelchair or bedside chair)?: None Help needed to walk in hospital room?: None Help needed climbing 3-5 steps with a railing? : A Little 6 Click Score: 23    End of Session   Activity Tolerance: Patient tolerated treatment well;Patient limited by fatigue Patient left: in chair;with call bell/phone within reach Nurse Communication: Mobility status PT Visit Diagnosis: Unsteadiness on feet (R26.81);Other abnormalities of gait and mobility (R26.89);Muscle weakness (generalized) (M62.81)    Time: 0092-3300 PT Time Calculation (min) (ACUTE ONLY): 23 min   Charges:   PT Evaluation $PT Eval Moderate Complexity: 1 Mod PT Treatments $Gait Training: 23-37 mins        2:47 PM, 04/27/19 Lonell Grandchild, MPT Physical Therapist with [facility] [phone] office [phone] mobile phone

## 2019-04-27 NOTE — Progress Notes (Signed)
Patient refusing bed alarm, explained rationale. Accepts responsibility for refusal of bed alarm. Continue to monitor, frequent rounding
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
## 2019-04-27 NOTE — Plan of Care (Signed)

## 2019-04-27 NOTE — Telephone Encounter (Signed)
RF request for linsopril  LOV: 04/10/19 Next ov:  Not scheduled. Patient has transportation issues Last written: 09/22/18   Please advise.

## 2019-04-27 NOTE — TOC Initial Note (Signed)
Transition of Care [facility]) - Initial/Assessment Note    Patient Details  Name: Jacob Frank MRN: [id number] Date of Birth: 07/29/1951  Transition of Care [facility]) CM/SW Contact:    Boneta Lucks, RN Phone Number: 04/27/2019, 11:50 AM  Clinical Narrative:   Patient admitted form Syncope and Collapse. Patient had previous lived in shelter and was unable to get [facility]. He does not drive, rides the [employer] and uses [employer] for deliveries.   Now living in an apartment in [place].  His friend left last week so patient is unsure how much longer he will be able to afford the apartment.  PT recommended HHPT, patient is agreeable with not preferences. TOC will add SW to assess patient at home.  Georgina Snell with [facility] took the referral. Patient will discharge on 7/28.                Expected Discharge Plan: [place] Barriers to Discharge: Continued Medical Work up   Patient Goals and CMS Choice Patient states their goals for this hospitalization and ongoing recovery are:: to go home. CMS Medicare.gov Compare Post Acute Care list provided to:: Patient Choice offered to / list presented to : Patient  Expected Discharge Plan and Services Expected Discharge Plan: [place] In-house Referral: Clinical Social Work   Post Acute Care Choice: [place] arrangements for the past 2 months: Apartment                           HH Arranged: Social Work, PT [place] Agency: [place] Date [place]: 04/27/19 Time [place]: 50 Representative spoke with at [place]: Georgina Snell  Prior Living Arrangements/Services Living arrangements for the past 2 months: Apartment Lives with:: Self Patient language and need for interpreter reviewed:: Yes Do you feel safe going back to the place where you live?: Yes      Need for Family Participation in Patient Care: Yes (Comment) Care giver support system in place?: No (comment) Current home  services: DME, Other (comment)(cane) Criminal Activity/Legal Involvement Pertinent to Current Situation/Hospitalization: No - Comment as needed  Activities of Daily Living Home Assistive Devices/Equipment: Cane (specify quad or straight) ADL Screening (condition at time of admission) Patient's cognitive ability adequate to safely complete daily activities?: Yes Is the patient deaf or have difficulty hearing?: No Does the patient have difficulty seeing, even when wearing glasses/contacts?: No Does the patient have difficulty concentrating, remembering, or making decisions?: No Patient able to express need for assistance with ADLs?: Yes Does the patient have difficulty dressing or bathing?: No Independently performs ADLs?: Yes (appropriate for developmental age) Does the patient have difficulty walking or climbing stairs?: No Weakness of Legs: None Weakness of Arms/Hands: None  Permission Sought/Granted Permission sought to share information with : Case Manager, Customer service manager Permission granted to share information with : Yes, Verbal Permission Granted  Share Information with NAME: Georgina Snell  Permission granted to share info w AGENC[facility]: [facility]        Emotional Assessment   Attitude/Demeanor/Rapport: Gracious Affect (typically observed): Accepting Orientation: : Oriented to Self, Oriented to Place, Oriented to  Time, Oriented to Situation Alcohol / Substance Use: Alcohol Use Psych Involvement: No (comment)  Admission diagnosis:  Hyponatremia [E87.1] Patient Active Problem List   Diagnosis Date Noted  . Syncope and collapse 04/24/2019  . Alcohol abuse 04/24/2019  . Anxiety and depression   . Hyponatremia 12/12/2016  .  Alcohol abuse with intoxication ([place]) 12/12/2016  . Syncope 12/12/2016  . Elevated PSA 12/02/2013  . Health maintenance examination 12/01/2013  . Olecranon bursitis of right elbow 04/13/2013  . GAD (generalized anxiety disorder) 03/04/2013  .  Prostate cancer screening 05/12/2012  . Gross hematuria 04/23/2012  . Prostatitis 04/23/2012  . COPD (chronic obstructive pulmonary disease) ([place]) 02/06/2012  . HTN (hypertension), benign 02/06/2012  . Tobacco dependence 02/06/2012  . COPD exacerbation ([place]) 02/06/2012  . Vitamin B12 deficiency 02/06/2012   PCP:  [name], MD Pharmacy:   [place], [place] [employer] [address] [place] [address] Phone: [phone] Fax: [place], [place] [address] Phone: [phone] Fax: [phone]        Readmission Risk Interventions No flowsheet data found.

## 2019-04-27 NOTE — Progress Notes (Signed)
Notified  RT due to patient wheezing and saying he needed a breathing treatment as he was found trying to take his home medications that were left in his possession earlier and unknown to staff.

## 2019-04-27 NOTE — Telephone Encounter (Signed)
Noted  

## 2019-04-27 NOTE — Progress Notes (Signed)
PROGRESS NOTE    Jacob Frank  YWV:[id number] DOB: 1951-07-19 DOA: 04/24/2019 PCP: [name], MD    Brief Narrative:  68 y.o. male with PMH significant for anxiety.depression, COPD, alcohol abuse, tobacco abuse and HTN; who presented to ED secondary to weakness and syncope. Patient symptoms has been intermittently present for the last 2 weeks and worsening. Patient reports decrease oral intake and poor appetite. No CP, no SOB, no sick contacts, no fever, no chills, no hematuria, no dysuria, no melena, no hematochezia and no focal weakness.  In the ED workup demonstrated severe hyponatremia (Na 118), mild hypokalemia, hyponatremia and was found to be on A. Fib (HR 140's range). Neg troponin and no acute cardiopulmonary process on CXR. Patient received IVF's and was started on cardizem drip. TRH called to admit patient for further evaluation and management.  Assessment & Plan:   Principal Problem:   Syncope and collapse Active Problems:   COPD (chronic obstructive pulmonary disease) (HCC)   HTN (hypertension), benign   Tobacco dependence   Hyponatremia   Anxiety and depression   Alcohol abuse   1. Syncope and collapse.  Likely related to hypovolemia.  Symptoms have improved with IV hydration.  Echocardiogram unremarkable.  Cortisol and TSH also unrevealing.  Continue clinically improving. 2. Atrial fibrillation with rapid ventricular response.  Was initially on Cardizem drip which has since been discontinued.  Heart rate still elevated intermittently; will continue Lopressor but adjust dose for better control as recommended by cardiology service.  Continue Eliquis for secondary prevention.  2D echo with preserved ejection fraction and no significant valvular abnormalities.  3. Hyponatremia.  Likely related to beer potomania, dehydration poor oral intake. Improved with IV hydration.  Continue monitoring electrolytes trend. Na 133 4. Alcohol abuse.  No signs of alcohol withdrawal at  this time.  Continue to monitor on CIWA protocol.  Continue thiamine and folic acid. 5. COPD.  No shortness of breath or wheezing at this time.  Continue bronchodilators as needed. 6. Hypertension.  Blood pressure currently stable.  Continue to monitor since he has been started on beta-blockers. 7. Tobacco abuse: Cessation counseling has been provided.  Continue nicotine patch. 8. Anxiety and depression: Mood overall stable.  Was not taking any medications prior to admission. Receiving as needed ativan through CIWA protocol.   DVT prophylaxis: Apixaban Code Status: Full code Family Communication: None Disposition Plan: Discharge home once heart rate is improved and well controlled.  [facility] cardiology service assistance and recommendations.  Will follow response to adjusted dose of metoprolol.   Consultants:   Cardiology   Procedures:   2-D echo  See below for x-ray reports.   Antimicrobials:   None    Subjective: Afebrile, no chest pain, no nausea, no vomiting.  Still having some intermittent palpitation especially on exertion.  Overall feeling better.  Objective: Vitals:   04/27/19 0700 04/27/19 0739 04/27/19 0800 04/27/19 0924  BP:   (!) 114/95 (!) 119/92  Pulse: 92  87 86  Resp: (!) 21  (!) 21 20  Temp:    97.9 F (36.6 C)  TempSrc:    Oral  SpO2: (!) 89% 95% 94% 92%  Weight:      Height:        Intake/Output Summary (Last 24 hours) at 04/27/2019 1153 Last data filed at 04/27/2019 0424 Gross per 24 hour  Intake 632.5 ml  Output 1050 ml  Net -417.5 ml   Filed Weights   04/24/19 0921 04/24/19 1330 04/26/19 0600  Weight: 72.6 kg 77.4 kg 77.5 kg    Examination: General exam: Alert, awake, oriented x 3; feeling much better.  Denies chest pain, no nausea, no vomiting.  Still experiencing palpitations with activity. Respiratory system: Clear to auscultation. Respiratory effort normal.  Good O2 sat on room air. Cardiovascular system: Irregular.  No murmurs, no  rubs, no gallops.   Gastrointestinal system: Abdomen is nondistended, soft and nontender. No organomegaly or masses felt. Normal bowel sounds heard. Central nervous system: Alert and oriented. No focal neurological deficits. Extremities: No C/C/E, +pedal pulses Skin: No rashes, lesions or ulcers Psychiatry: Judgement and insight appear normal. Mood & affect appropriate.   Data Reviewed: I have personally reviewed following labs and imaging studies  CBC: Recent Labs  Lab 04/24/19 0928 04/25/19 0404 04/26/19 0413  WBC 8.4 5.9 5.4  NEUTROABS 6.6  --   --   HGB 14.9 13.2 13.0  HCT 41.3 38.1* 37.2*  MCV 92.0 95.7 95.1  PLT 162 161 725   Basic Metabolic Panel: Recent Labs  Lab 04/24/19 0928 04/24/19 1212 04/25/19 0404 04/26/19 0413 04/26/19 0521 04/27/19 0413  NA 118*  --  127* 127*  --  133*  K 3.1*  --  3.6 2.8*  --  3.5  CL 77*  --  93* 92*  --  99  CO2 25  --  25 24  --  25  GLUCOSE 99  --  85 89  --  89  BUN 8  --  7* 10  --  8  CREATININE 0.75  --  0.67 0.82  --  0.75  CALCIUM 9.1  --  8.1* 8.6*  --  8.5*  MG  --  1.7 2.0  --  1.9 1.8  PHOS  --  3.4  --   --   --   --    GFR: Estimated Creatinine Clearance: 90.8 mL/min (by C-G formula based on SCr of 0.75 mg/dL).   Liver Function Tests: Recent Labs  Lab 04/24/19 0928  AST 60*  ALT 104*  ALKPHOS 38  BILITOT 1.9*  PROT 7.1  ALBUMIN 4.5   Thyroid Function Tests: Recent Labs    04/24/19 1212  TSH 0.731    Recent Results (from the past 240 hour(s))  SARS Coronavirus 2 (CEPHEID - Performed in [place] hospital lab), Hosp Order     Status: None   Collection Time: 04/24/19 10:31 AM   Specimen: Nasopharyngeal Swab  Result Value Ref Range Status   SARS Coronavirus 2 NEGATIVE NEGATIVE Final    Comment: (NOTE) If result is NEGATIVE SARS-CoV-2 target nucleic acids are NOT DETECTED. The SARS-CoV-2 RNA is generally detectable in upper and lower  respiratory specimens during the acute phase of infection.  The lowest  concentration of SARS-CoV-2 viral copies this assay can detect is 250  copies / mL. A negative result does not preclude SARS-CoV-2 infection  and should not be used as the sole basis for treatment or other  patient management decisions.  A negative result may occur with  improper specimen collection / handling, submission of specimen other  than nasopharyngeal swab, presence of viral mutation(s) within the  areas targeted by this assay, and inadequate number of viral copies  (<250 copies / mL). A negative result must be combined with clinical  observations, patient history, and epidemiological information. If result is POSITIVE SARS-CoV-2 target nucleic acids are DETECTED. The SARS-CoV-2 RNA is generally detectable in upper and lower  respiratory specimens dur ing the acute  phase of infection.  Positive  results are indicative of active infection with SARS-CoV-2.  Clinical  correlation with patient history and other diagnostic information is  necessary to determine patient infection status.  Positive results do  not rule out bacterial infection or co-infection with other viruses. If result is PRESUMPTIVE POSTIVE SARS-CoV-2 nucleic acids MAY BE PRESENT.   A presumptive positive result was obtained on the submitted specimen  and confirmed on repeat testing.  While 2019 novel coronavirus  (SARS-CoV-2) nucleic acids may be present in the submitted sample  additional confirmatory testing may be necessary for epidemiological  and / or clinical management purposes  to differentiate between  SARS-CoV-2 and other Sarbecovirus currently known to infect humans.  If clinically indicated additional testing with an alternate test  methodology [phone]) is advised. The SARS-CoV-2 RNA is generally  detectable in upper and lower respiratory sp ecimens during the acute  phase of infection. The expected result is Negative. Fact Sheet for Patients:  StrictlyIdeas.no  Fact Sheet for Healthcare Providers: BankingDealers.co.za This test is not yet approved or cleared by the [place] FDA and has been authorized for detection and/or diagnosis of SARS-CoV-2 by FDA under an Emergency Use Authorization (EUA).  This EUA will remain in effect (meaning this test can be used) for the duration of the COVID-19 declaration under Section 564(b)(1) of the Act, 21 U.S.C. section 360bbb-3(b)(1), unless the authorization is terminated or revoked sooner. Performed at [facility], [address] [place], [place] [address]   MRSA PCR Screening     Status: None   Collection Time: 04/24/19  1:44 PM   Specimen: Nasal Mucosa; Nasopharyngeal  Result Value Ref Range Status   MRSA by PCR NEGATIVE NEGATIVE Final    Comment:        The GeneXpert MRSA Assay (FDA approved for NASAL specimens only), is one component of a comprehensive MRSA colonization surveillance program. It is not intended to diagnose MRSA infection nor to guide or monitor treatment for MRSA infections. Performed at [facility], [address] [place],  [address]      Scheduled Meds: . apixaban  5 mg Oral BID  . folic acid  1 mg Oral Daily  . metoprolol tartrate  75 mg Oral BID  . mometasone-formoterol  2 puff Inhalation BID  . multivitamin with minerals  1 tablet Oral Daily  . nicotine  21 mg Transdermal Daily  . thiamine  100 mg Oral Daily   Or  . thiamine  100 mg Intravenous Daily  . umeclidinium bromide  1 puff Inhalation Daily   Continuous Infusions:    LOS: 3 days    Time spent: 30 mins    Barton Dubois, MD [facility] [phone]   04/27/2019, 11:53 AM

## 2019-04-27 NOTE — Telephone Encounter (Signed)
FYI. Please see below.  Spoke with patient and he just wanted to let PCP know he would be discharged this afternoon or tomorrow morning.

## 2019-04-27 NOTE — Telephone Encounter (Signed)
Copied from [place] [id number]. Topic: General - Other >> Apr 27, 2019  8:16 AM Celene Kras A wrote: Reason for CRM: Pt called stating he is needing to speak with PCP regarding his hospital visit. Please advise.

## 2019-04-27 NOTE — Telephone Encounter (Signed)
I'll deny this RF. He is in hosp and was put on lopressor for a-fib and is no longer on lisinopril. Will reassess at hosp f/u to see if any additional med (such as lisinopril) is needed.s

## 2019-04-28 DIAGNOSIS — I4891 Unspecified atrial fibrillation: Secondary | ICD-10-CM

## 2019-04-28 MED ORDER — APIXABAN 5 MG PO TABS: 5.0000 mg | ORAL_TABLET | Freq: Two times a day (BID) | ORAL | 2 refills | Status: DC

## 2019-04-28 MED ORDER — MAGNESIUM SULFATE 2 GM/50ML IV SOLN
2.0000 g | Freq: Once | INTRAVENOUS | Status: AC
  Administered 2019-04-28: 10:00:00 2 g via INTRAVENOUS
  Filled 2019-04-28: qty 50

## 2019-04-28 MED ORDER — PANTOPRAZOLE SODIUM 20 MG PO TBEC: 20.0000 mg | DELAYED_RELEASE_TABLET | Freq: Every day | ORAL | 2 refills | Status: DC

## 2019-04-28 MED ORDER — LORAZEPAM 2 MG/ML IJ SOLN: 0.0000 mg | Freq: Two times a day (BID) | INTRAMUSCULAR | Status: DC

## 2019-04-28 MED ORDER — METOPROLOL SUCCINATE ER 50 MG PO TB24
200.0000 mg | ORAL_TABLET | Freq: Every day | ORAL | Status: DC
  Administered 2019-04-28: 10:00:00 200 mg via ORAL
  Filled 2019-04-28: qty 4

## 2019-04-28 MED ORDER — ADULT MULTIVITAMIN W/MINERALS CH: 1.0000 | ORAL_TABLET | Freq: Every day | ORAL | Status: DC

## 2019-04-28 MED ORDER — LORAZEPAM 2 MG/ML IJ SOLN: 1.0000 mg | Freq: Four times a day (QID) | INTRAMUSCULAR | Status: DC | PRN

## 2019-04-28 MED ORDER — LORAZEPAM 2 MG/ML IJ SOLN: 0.0000 mg | Freq: Four times a day (QID) | INTRAMUSCULAR | Status: DC

## 2019-04-28 MED ORDER — METOPROLOL SUCCINATE ER 200 MG PO TB24: 200.0000 mg | ORAL_TABLET | Freq: Every day | ORAL | 2 refills | Status: DC

## 2019-04-28 MED ORDER — NICOTINE 21 MG/24HR TD PT24: 21.0000 mg | MEDICATED_PATCH | Freq: Every day | TRANSDERMAL | 1 refills | Status: DC

## 2019-04-28 MED ORDER — LORAZEPAM 1 MG PO TABS
1.0000 mg | ORAL_TABLET | Freq: Four times a day (QID) | ORAL | Status: DC | PRN
  Administered 2019-04-28: 1 mg via ORAL
  Filled 2019-04-28: qty 1

## 2019-04-28 MED ORDER — FOLIC ACID 1 MG PO TABS: 1.0000 mg | ORAL_TABLET | Freq: Every day | ORAL | 0 refills | Status: DC

## 2019-04-28 MED ORDER — METOPROLOL SUCCINATE ER 50 MG PO TB24: 200.0000 mg | ORAL_TABLET | Freq: Every day | ORAL | Status: DC

## 2019-04-28 NOTE — TOC Transition Note (Signed)
Transition of Care [facility]) - CM/SW Discharge Note   Patient Details  Name: Jacob Frank MRN: [id number] Date of Birth: 12-26-1950  Transition of Care [facility]) CM/SW Contact:  Ihor Gully, LCSW Phone Number: 04/28/2019, 4:45 PM   Clinical Narrative:    Georgina Snell at [facility] notified that patient will discharge today with RN, PT, SW ordered. LCSW signing off.   Final next level of care: [place] Barriers to Discharge: Continued Medical Work up   Patient Goals and CMS Choice Patient states their goals for this hospitalization and ongoing recovery are:: to go home. CMS Medicare.gov Compare Post Acute Care list provided to:: Patient Choice offered to / list presented to : Patient  Discharge Placement                       Discharge Plan and Services In-house Referral: Clinical Social Work   Post Acute Care Choice: Home Health                    HH Arranged: Social Work, PT [facility] Agency: [place] Date [place]: 04/27/19 Time Talent: [phone] Representative spoke with at [place]: [place] Determinants of Health ([place]) Interventions     Readmission Risk Interventions No flowsheet data found.

## 2019-04-28 NOTE — Discharge Summary (Signed)
Physician Discharge Summary  Jacob Frank GGY:[id number] DOB: [birth year] DOA: 04/24/2019  PCP: [name], MD  Admit date: 04/24/2019 Discharge date: 04/28/2019  Time spent: 35 minutes  Recommendations for Outpatient Follow-up:  1. Repeat basic metabolic panel to follow electrolytes and renal function 2. Reassess blood pressure and further adjust antihypertensive regimen if needed. 3. Continue assisting patient with alcohol and tobacco cessation.   Discharge Diagnoses:  Principal Problem:   Syncope and collapse Active Problems:   COPD (chronic obstructive pulmonary disease) (HCC)   HTN (hypertension), benign   Tobacco dependence   Hyponatremia   Anxiety and depression   Alcohol abuse   Atrial fibrillation with RVR ([place])   Discharge Condition: Stable and improved.  Patient discharged home with instruction to follow-up with PCP in 10 days and to follow-up with cardiology service as instructed.  Diet recommendation: Heart healthy diet.  Filed Weights   04/24/19 0921 04/24/19 1330 04/26/19 0600  Weight: 72.6 kg 77.4 kg 77.5 kg    History of present illness:  68 y.o. male with PMH significant for anxiety.depression, COPD, alcohol abuse, tobacco abuse and HTN; who presented to ED secondary to weakness and syncope. Patient symptoms has been intermittently present for the last 2 weeks and worsening. Patient reports decrease oral intake and poor appetite. No CP, no SOB, no sick contacts, no fever, no chills, no hematuria, no dysuria, no melena, no hematochezia and no focal weakness.  In the ED workup demonstrated severe hyponatremia (Na 118), mild hypokalemia, hyponatremia and was found to be on A. Fib (HR 140's range). Neg troponin and no acute cardiopulmonary process on CXR. Patient received IVF's and was started on cardizem drip. TRH called to admit patient for further evaluation and management.  Hospital Course:  1. Syncope and collapse.  Likely related to hypovolemia.   Symptoms have improved with IV hydration.  Echocardiogram unremarkable.  Cortisol and TSH also unrevealing.  Continue outpatient follow-up with cardiology service.  Patient advised to maintain adequate hydration. 2. Atrial fibrillation with rapid ventricular response.  Was initially on Cardizem drip which has since been discontinued.  Heart rate stable and well-controlled at time of discharge; will continue Lopressor but adjust dose for better control as recommended by cardiology service.  Continue Eliquis for secondary prevention.  2D echo with preserved ejection fraction and no significant valvular abnormalities.  No shortness of breath or palpitations. 3. Hyponatremia.  Likely related to beer potomania, dehydration and poor oral intake. Improved with IV hydration.  Continue monitoring electrolytes trend. Na 133 at discharge.  Repeat basic metabolic panel at follow-up visit to reassess electrolytes trend. 4. Alcohol abuse.  No signs of alcohol withdrawal at this time.  Continue to monitor on CIWA protocol.  Continue thiamine and folic acid.  Cessation counseling provided. 5. COPD.  No shortness of breath or wheezing at this time.  Continue bronchodilators as needed and home daily inhaler maintenance therapy.. 6. Hypertension.  Blood pressure currently stable.  Continue to monitor since he has been started on beta-blockers.  Advised to follow heart healthy diet and to stop alcohol consumption. 7. Tobacco abuse: Cessation counseling has been provided.  Continue nicotine patch at discharge. 8. Anxiety and depression: Mood overall stable.  Was not taking any medications prior to admission.  Continue to follow moderate stability as an outpatient.   Procedures:  See below for x-ray report  2D echo: Preserved ejection fraction, no wall motion abnormalities and no significant valvular disorders.  Consultations:  Cardiology service  Discharge Exam:  Vitals:   04/28/19 0759 04/28/19 1200  BP:  102/71   Pulse:  83  Resp:  18  Temp:  98.1 F (36.7 C)  SpO2: 97% 99%    General: Afebrile, no chest pain, no shortness of breath, no nausea, no vomiting.  Patient denies palpitation. Cardiovascular: Rate controlled, no murmurs, no rubs, no gallops. Respiratory: Good air movement bilaterally, no wheezing, normal respiratory effort. Abdomen: Soft, nontender, nondistended, positive bowel sounds Extremities: No edema, no cyanosis, no clubbing.  Discharge Instructions   Discharge Instructions    Diet - low sodium heart healthy   Complete by: As directed    Discharge instructions   Complete by: As directed    Follow heart healthy diet The medications are prescribed Arrange follow-up with PCP in 10 days Follow-up with cardiology service as instructed Maintain adequate hydration Stop tobacco abuse and alcohol abuse.     Allergies as of 04/28/2019      Reactions   Citalopram Other (See Comments)   Question of whether it was making him feel like his throat was "closed up".      Medication List    STOP taking these medications   amLODipine 10 MG tablet Commonly known as: NORVASC   aspirin 325 MG EC tablet   lisinopril 40 MG tablet Commonly known as: ZESTRIL     TAKE these medications   Advair Diskus 250-50 MCG/DOSE Aepb Generic drug: Fluticasone-Salmeterol INHALE ONE PUFF EVERY 12 HOURS. What changed: See the new instructions.   albuterol (2.5 MG/3ML) 0.083% nebulizer solution Commonly known as: PROVENTIL INHALE 1 VIAL VIA NEBULIZER EVERY 6 HOURS AS NEEDED FOR WHEEZING OR SHORTNESS OF BREATH. What changed: See the new instructions.   Ventolin HFA 108 (90 Base) MCG/ACT inhaler Generic drug: albuterol 2 PUFFS EVERY FOUR HOURS AS NEEDED FOR WHEEZING What changed: See the new instructions.   apixaban 5 MG Tabs tablet Commonly known as: ELIQUIS Take 1 tablet (5 mg total) by mouth 2 (two) times daily.   cyanocobalamin 1000 MCG/ML injection Commonly known as: (VITAMIN  B-12) Inject 1,000 mcg into the muscle every 30 (thirty) days.   folic acid 1 MG tablet Commonly known as: FOLVITE Take 1 tablet (1 mg total) by mouth daily.   metoprolol 200 MG 24 hr tablet Commonly known as: TOPROL-XL Take 1 tablet (200 mg total) by mouth daily. Take with or immediately following a meal. Start taking on: April 29, 2019   multivitamin with minerals Tabs tablet Take 1 tablet by mouth daily.   nicotine 21 mg/24hr patch Commonly known as: NICODERM CQ - dosed in mg/24 hours Place 1 patch (21 mg total) onto the skin daily. Start taking on: April 29, 2019   pantoprazole 20 MG tablet Commonly known as: Protonix Take 1 tablet (20 mg total) by mouth daily.   QUEtiapine 50 MG tablet Commonly known as: SEROQUEL TAKE 3-4 tabs po qhs prn What changed:   how much to take  how to take this  when to take this  reasons to take this   umeclidinium bromide 62.5 MCG/INH Aepb Commonly known as: Incruse Ellipta Inhale 1 puff into the lungs daily.      Allergies  Allergen Reactions  . Citalopram Other (See Comments)    Question of whether it was making him feel like his throat was "closed up".   Follow-up Information    [name], PA-C Follow up on 05/14/2019.   Specialties: Physician Assistant, Cardiology Why: Cardiology Hospital Follow-up on 05/14/2019 at 3:30 PM.  Contact information: [address] [place] [address] [phone]        [name], MD. Schedule an appointment as soon as possible for a visit in 10 day(s).   Specialty: Family Medicine Contact information: [address] [place] [address] [phone]        Satira Sark, MD .   Specialty: Cardiology Contact information: [place] [address] [phone]           The results of significant diagnostics from this hospitalization (including imaging, microbiology, ancillary and laboratory) are listed below for reference.     Significant Diagnostic Studies: Dg Chest Portable 1 View  Result Date: 04/24/2019 CLINICAL DATA:  Syncopal episodes, nausea and generalized weakness. EXAM: PORTABLE CHEST 1 VIEW COMPARISON:  09/18/2018 FINDINGS: Stable emphysematous lung disease. There is no evidence of pulmonary edema, consolidation, pneumothorax, nodule or pleural fluid. The heart size and mediastinal contours are normal. Visualized bony structures are unremarkable. IMPRESSION: Stable emphysema.  No acute findings. Electronically Signed   By: Aletta Edouard M.D.   On: 04/24/2019 09:55    Microbiology: Recent Results (from the past 240 hour(s))  SARS Coronavirus 2 (CEPHEID - Performed in [place] hospital lab), Hosp Order     Status: None   Collection Time: 04/24/19 10:31 AM   Specimen: Nasopharyngeal Swab  Result Value Ref Range Status   SARS Coronavirus 2 NEGATIVE NEGATIVE Final    Comment: (NOTE) If result is NEGATIVE SARS-CoV-2 target nucleic acids are NOT DETECTED. The SARS-CoV-2 RNA is generally detectable in upper and lower  respiratory specimens during the acute phase of infection. The lowest  concentration of SARS-CoV-2 viral copies this assay can detect is 250  copies / mL. A negative result does not preclude SARS-CoV-2 infection  and should not be used as the sole basis for treatment or other  patient management decisions.  A negative result may occur with  improper specimen collection / handling, submission of specimen other  than nasopharyngeal swab, presence of viral mutation(s) within the  areas targeted by this assay, and inadequate number of viral copies  (<250 copies / mL). A negative result must be combined with clinical  observations, patient history, and epidemiological information. If result is POSITIVE SARS-CoV-2 target nucleic acids are DETECTED. The SARS-CoV-2 RNA is generally detectable in upper and lower  respiratory specimens dur ing the acute phase of infection.  Positive  results  are indicative of active infection with SARS-CoV-2.  Clinical  correlation with patient history and other diagnostic information is  necessary to determine patient infection status.  Positive results do  not rule out bacterial infection or co-infection with other viruses. If result is PRESUMPTIVE POSTIVE SARS-CoV-2 nucleic acids MAY BE PRESENT.   A presumptive positive result was obtained on the submitted specimen  and confirmed on repeat testing.  While 2019 novel coronavirus  (SARS-CoV-2) nucleic acids may be present in the submitted sample  additional confirmatory testing may be necessary for epidemiological  and / or clinical management purposes  to differentiate between  SARS-CoV-2 and other Sarbecovirus currently known to infect humans.  If clinically indicated additional testing with an alternate test  methodology [phone]) is advised. The SARS-CoV-2 RNA is generally  detectable in upper and lower respiratory sp ecimens during the acute  phase of infection. The expected result is Negative. Fact Sheet for Patients:  StrictlyIdeas.no Fact Sheet for Healthcare Providers: BankingDealers.co.za This test is not yet approved or cleared by the  [place] Architectural technologist and has been authorized for detection and/or diagnosis of SARS-CoV-2 by FDA under an Print production planner (EUA).  This EUA will remain in effect (meaning this test can be used) for the duration of the COVID-19 declaration under Section 564(b)(1) of the Act, 21 U.S.C. section 360bbb-3(b)(1), unless the authorization is terminated or revoked sooner. Performed at [facility], [address], [place], [place] [address]   MRSA PCR Screening     Status: None   Collection Time: 04/24/19  1:44 PM   Specimen: Nasal Mucosa; Nasopharyngeal  Result Value Ref Range Status   MRSA by PCR NEGATIVE NEGATIVE Final    Comment:        The GeneXpert MRSA Assay (FDA approved for NASAL  specimens only), is one component of a comprehensive MRSA colonization surveillance program. It is not intended to diagnose MRSA infection nor to guide or monitor treatment for MRSA infections. Performed at [facility], [address], [place], [place] [address]      Labs: Basic Metabolic Panel: Recent Labs  Lab 04/24/19 [phone] 04/24/19 1212 04/25/19 0404 04/26/19 0413 04/26/19 0521 04/27/19 0413  NA 118*  --  127* 127*  --  133*  K 3.1*  --  3.6 2.8*  --  3.5  CL 77*  --  93* 92*  --  99  CO2 25  --  25 24  --  25  GLUCOSE 99  --  85 89  --  89  BUN 8  --  7* 10  --  8  CREATININE 0.75  --  0.67 0.82  --  0.75  CALCIUM 9.1  --  8.1* 8.6*  --  8.5*  MG  --  1.7 2.0  --  1.9 1.8  PHOS  --  3.4  --   --   --   --    Liver Function Tests: Recent Labs  Lab 04/24/19 0928  AST 60*  ALT 104*  ALKPHOS 38  BILITOT 1.9*  PROT 7.1  ALBUMIN 4.5   CBC: Recent Labs  Lab 04/24/19 0928 04/25/19 0404 04/26/19 0413  WBC 8.4 5.9 5.4  NEUTROABS 6.6  --   --   HGB 14.9 13.2 13.0  HCT 41.3 38.1* 37.2*  MCV 92.0 95.7 95.1  PLT 162 161 165    Signed:  Barton Dubois MD.  [facility] 04/28/2019, 2:52 PM

## 2019-04-28 NOTE — Progress Notes (Addendum)
Progress Note  Patient Name: Jacob Frank Date of Encounter: 04/28/2019  Primary Cardiologist: Rozann Lesches, MD   Subjective   No chest pain or palpitations overnight of this AM. Breathing improved. Reports still feeling weak. Ambulates with a cane at home.   Inpatient Medications    Scheduled Meds: . apixaban  5 mg Oral BID  . folic acid  1 mg Oral Daily  . LORazepam  0-4 mg Intravenous Q6H   Followed by  . [START ON 04/30/2019] LORazepam  0-4 mg Intravenous Q12H  . metoprolol tartrate  75 mg Oral BID  . mometasone-formoterol  2 puff Inhalation BID  . multivitamin with minerals  1 tablet Oral Daily  . nicotine  21 mg Transdermal Daily  . thiamine  100 mg Oral Daily   Or  . thiamine  100 mg Intravenous Daily  . umeclidinium bromide  1 puff Inhalation Daily   Continuous Infusions: . magnesium sulfate bolus IVPB     PRN Meds: acetaminophen **OR** acetaminophen, albuterol, alum & mag hydroxide-simeth, LORazepam **OR** LORazepam, ondansetron **OR** ondansetron (ZOFRAN) IV   Vital Signs    Vitals:   04/27/19 2345 04/28/19 0536 04/28/19 0754 04/28/19 0759  BP: (!) 118/99 (!) 129/99    Pulse: 85 78    Resp: 18 18    Temp: 98.6 F (37 C) 97.8 F (36.6 C)    TempSrc: Oral Oral    SpO2: 97% 98% 97% 97%  Weight:      Height:        Intake/Output Summary (Last 24 hours) at 04/28/2019 0853 Last data filed at 04/27/2019 1700 Gross per 24 hour  Intake 480 ml  Output -  Net 480 ml    Last 3 Weights 04/26/2019 04/24/2019 04/24/2019  Weight (lbs) 170 lb 13.7 oz 170 lb 10.2 oz 160 lb  Weight (kg) 77.5 kg 77.4 kg 72.576 kg      Telemetry    Atrial fibrillation, HR mostly in 70's to 90's, peaking in 110's. - Personally Reviewed  ECG    No new tracings.   Physical Exam   General: Well developed, well nourished, male appearing in no acute distress. Head: Normocephalic, atraumatic.  Neck: Supple without bruits, JVD not elevated. Lungs:  Resp regular and unlabored,  CTA without wheezing or rales. Heart: Irregularly irregular, S1, S2, no S3, S4, or murmur; no rub. Abdomen: Soft, non-tender, non-distended with normoactive bowel sounds. No hepatomegaly. No rebound/guarding. No obvious abdominal masses. Extremities: No clubbing, cyanosis, or edema. Distal pedal pulses are 2+ bilaterally. Neuro: Alert and oriented X 3. Moves all extremities spontaneously. Psych: Normal affect.  Labs    Chemistry Recent Labs  Lab 04/24/19 0928 04/25/19 0404 04/26/19 0413 04/27/19 0413  NA 118* 127* 127* 133*  K 3.1* 3.6 2.8* 3.5  CL 77* 93* 92* 99  CO2 25 25 24 25   GLUCOSE 99 85 89 89  BUN 8 7* 10 8  CREATININE 0.75 0.67 0.82 0.75  CALCIUM 9.1 8.1* 8.6* 8.5*  PROT 7.1  --   --   --   ALBUMIN 4.5  --   --   --   AST 60*  --   --   --   ALT 104*  --   --   --   ALKPHOS 38  --   --   --   BILITOT 1.9*  --   --   --   GFRNONAA >60 >60 >60 >60  GFRAA >60 >60 >60 >60  ANIONGAP  16* 9 11 9      Hematology Recent Labs  Lab 04/24/19 0928 04/25/19 0404 04/26/19 0413  WBC 8.4 5.9 5.4  RBC 4.49 3.98* 3.91*  HGB 14.9 13.2 13.0  HCT 41.3 38.1* 37.2*  MCV 92.0 95.7 95.1  MCH 33.2 33.2 33.2  MCHC 36.1* 34.6 34.9  RDW 12.3 12.9 13.0  PLT 162 161 165    Cardiac EnzymesNo results for input(s): TROPONINI in the last 168 hours. No results for input(s): TROPIPOC in the last 168 hours.   BNPNo results for input(s): BNP, PROBNP in the last 168 hours.   DDimer No results for input(s): DDIMER in the last 168 hours.   Radiology    No results found.  Cardiac Studies   Echocardiogram: 04/24/2019 IMPRESSIONS   1. The left ventricle has normal systolic function, with an ejection fraction of 55-60%. The cavity size was normal. Left ventricular diastolic Doppler parameters are indeterminate in the setting of atrial fibrillation. No evidence of left ventricular  regional wall motion abnormalities.  2. The right ventricle has normal systolic function. The cavity was  normal. There is no increase in right ventricular wall thickness. Right ventricular systolic pressure is normal with an estimated pressure of 23.1 mmHg.  3. The aortic valve is tricuspid. Mild calcification of the aortic valve. Mild aortic annular calcification noted.  4. The mitral valve is grossly normal.  5. The tricuspid valve is grossly normal.  6. The aorta is normal in size and structure.  Patient Profile     68 y.o. male w/ PMH of HTN, COPD, anxiety, depression, tobacco use, and alcohol abuse who presented to [facility] ED on 04/24/2019 for evaluation of weakness and presyncope. Was found to have hyponatremia (Na+ 118) and to be in new-onset atrial fibrillation with RVR.    Assessment & Plan    1. New-Onset Atrial Fibrillation with RVR - presented with weakness and near-syncope, found to be in atrial fibrillation with RVR in the setting of electrolyte abnormalities.  - Na+ 118 on admission, now improved to 133. Mg at 1.7 on admission and still low at 1.8. Will order additional supplementation. TSH WNL. Echo shows a preserved EF of 55-60% with no regional WMA.  - he initially required IV Cardizem and this has been discontinued and transitioned to Lopressor. Was on 50mg  BID and titrated to 75mg  BID yesterday evening. HR mostly in the 70's to 90's, peaking in 110's. May require further titration to 100mg  BID or transition to Toprol-XL if rates remain above goal. Would favor use of one AV nodal blocking agent over multiple ones given history of medication noncompliance.  - This patients CHA2DS2-VASc Score and unadjusted Ischemic Stroke Rate (% per year) is equal to 3.2 % stroke rate/year from a score of 3. He has been started on Eliquis 5mg  BID for anticoagulation. Monitor H&H closely as an outpatient given alcohol abuse. Hgb stable at 13.0 this admission.   2. Syncope - likely multifactorial in the setting of hypovolemia on admission and significant alcohol intake. No recurrent symptoms since  admission. Still reports lower extremity weakness. Evaluated by PT yesterday and Home Health PT recommended.   3. Hyponatremia - Na+ 118 on admission, improved to 133 today. Further management per admitting team.   4. Polysubstance Abuse - history of Cocaine use but denies using within the past several months. UDS was not checked this admission. Was consuming a 12-pack of beer daily and smoking 0.5 ppd prior to admission. Cessation advised.    For  questions or updates, please contact [place] Please consult [email or web address] for contact info under Cardiology/STEMI.   Arna Medici , PA-C 8:53 AM 04/28/2019 Pager: [phone]  Attendingnote  Patient seen and disucssed with PA Ahmed Prima, I agree with her documentation above. Admitted with afib with RVR, started on eliquis and lopressor. Rates remain variable, change to toprol 200mg  daily. Syncope and collapse likely related to hypovolemia related to EtOH, severe hyponatremia on admission.    We will follow telemetry only at this time. To get toprol 200mg  daily this AM, if rates <110 reasonable for discharge from cardiac standpoint this afternoon.   Zandra Abts MD

## 2019-04-28 NOTE — Progress Notes (Signed)
IV removed, patient tolerated well.  Reviewed AVS with patient who verbalized understanding.  Patient given prescription discount card for eliquis.  Patient also given bus voucher to transport patient home.  Patient taken to short stay exit via wheelchair.

## 2019-04-29 NOTE — Telephone Encounter (Signed)
Noted: nurse phone contact with patient for TCM. Signed:  Crissie Sickles, MD           04/29/2019

## 2019-04-29 NOTE — Telephone Encounter (Signed)
Transition Care Management Follow-up Telephone Call  Admit date: 04/24/2019 Discharge date: 04/28/2019 Principal Problem:  Syncope and collapse. New A-FIB   How have you been since you were released from the hospital? "I'm real weak"   Do you understand why you were in the hospital? no, pt states he was diagnosed with CHF. D/C summary states new onset AFIB.    Do you understand the discharge instructions? yes   Where were you discharged to? Home. Resides with friend.    Items Reviewed:  Medications reviewed: no, pt would like to review at appt. Will bring bottles.   Allergies reviewed: yes  Dietary changes reviewed: yes  Referrals reviewed: yes, F/U with Cardiology. [place] RN to visit.    Functional Questionnaire:   Activities of Daily Living (ADLs):   He states they are independent in the following: ambulation, bathing and hygiene, feeding, continence, grooming, toileting and dressing States they require assistance with the following: None.    Any transportation issues/concerns?: no   Any patient concerns? no   Confirmed importance and date/time of follow-up visits scheduled yes  Provider Appointment booked with PCP 05/08/2019, in office visit.   Confirmed with patient if condition begins to worsen call PCP or go to the ER.  Patient was given the office number and encouraged to call back with question or concerns.  : yes

## 2019-05-01 ENCOUNTER — Telehealth: Payer: Self-pay | Admitting: Family Medicine

## 2019-05-01 NOTE — Telephone Encounter (Signed)
yes

## 2019-05-01 NOTE — Telephone Encounter (Signed)
Contacted Jacob Frank and given verbal for [facility] orders they received for pt, okay per PCP.

## 2019-05-01 NOTE — Telephone Encounter (Signed)
Please advise, thanks.

## 2019-05-01 NOTE — Telephone Encounter (Signed)
Copied from [place] [phone]. Topic: General - Other >> May 01, 2019  1:42 PM Antonieta Iba C wrote: Reason for CRM: Veronda Prude called in to ask provider if he would sign off on pt's home health orders that they have received?   CB: [phone]

## 2019-05-04 ENCOUNTER — Other Ambulatory Visit: Payer: Self-pay

## 2019-05-04 NOTE — Patient Outreach (Signed)
[facility]) Care Management  05/04/2019  Bradely Rudin [facility] [birth year] [id number]  EMMI: general discharge red alert Referral date: 05/04/19 Referral reason: scheduled follow up: no Insurance:  Medicare/ medicaid Day # 1  Telephone call to patient regarding EMMI general discharge red alert. HIPAA verified by patient. RNCM introduced herself and explained reason for call. Patient states he is scheduled to see his primary MD on  05/08/19.  Patient states he uses [facility] for appointment transportation. Patient states he is taking his medications as prescribed and using his nebulizer as directed by his doctor.  Patient states he is receiving services with [place] home health. Patient states he has a friend that helps to take care to him. Patient denies having any new symptoms or concerns. He states he has been a little tired.  RNCM advised patient to notify MD of any changes in condition prior to scheduled appointment. RNCM provided contact name and number: [phone] or main office number [phone] and 24 hour nurse advise line [phone].  RNCM verified patient aware of 911 services for urgent/ emergent needs.  PLAN: RNCM will close case due to patient being assessed and having no further needs.   Quinn Plowman RN,BSN,CCM [facility] Telephonic  [phone]

## 2019-05-05 ENCOUNTER — Other Ambulatory Visit: Payer: Self-pay
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
## 2019-05-06 ENCOUNTER — Telehealth: Payer: Self-pay

## 2019-05-06 DIAGNOSIS — R5381 Other malaise: Secondary | ICD-10-CM | POA: Diagnosis not present

## 2019-05-06 DIAGNOSIS — Z209 Contact with and (suspected) exposure to unspecified communicable disease: Secondary | ICD-10-CM | POA: Diagnosis not present

## 2019-05-06 DIAGNOSIS — W19XXXA Unspecified fall, initial encounter: Secondary | ICD-10-CM | POA: Diagnosis not present

## 2019-05-06 DIAGNOSIS — T1490XA Injury, unspecified, initial encounter: Secondary | ICD-10-CM | POA: Diagnosis not present
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
## 2019-05-06 NOTE — Telephone Encounter (Signed)
Noted  
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
## 2019-05-08 ENCOUNTER — Ambulatory Visit (INDEPENDENT_AMBULATORY_CARE_PROVIDER_SITE_OTHER): Payer: Medicare Other | Admitting: Family Medicine

## 2019-05-08 ENCOUNTER — Encounter: Payer: Self-pay | Admitting: Family Medicine

## 2019-05-08 ENCOUNTER — Other Ambulatory Visit: Payer: Self-pay

## 2019-05-08 DIAGNOSIS — J441 Chronic obstructive pulmonary disease with (acute) exacerbation: Secondary | ICD-10-CM | POA: Diagnosis not present

## 2019-05-08 DIAGNOSIS — F5101 Primary insomnia: Secondary | ICD-10-CM | POA: Diagnosis not present

## 2019-05-08 DIAGNOSIS — F1020 Alcohol dependence, uncomplicated: Secondary | ICD-10-CM | POA: Diagnosis not present

## 2019-05-08 DIAGNOSIS — I4891 Unspecified atrial fibrillation: Secondary | ICD-10-CM

## 2019-05-08 DIAGNOSIS — J449 Chronic obstructive pulmonary disease, unspecified: Secondary | ICD-10-CM | POA: Diagnosis not present

## 2019-05-08 MED ORDER — AZITHROMYCIN 250 MG PO TABS: ORAL_TABLET | ORAL | 0 refills | Status: DC

## 2019-05-08 MED ORDER — PREDNISONE 20 MG PO TABS: ORAL_TABLET | ORAL | 0 refills | Status: DC
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
## 2019-05-11 ENCOUNTER — Telehealth: Payer: Self-pay | Admitting: Family Medicine

## 2019-05-11 NOTE — Telephone Encounter (Signed)
Patient is requesting a call back from Dr. Anitra Lauth. When questioned about what the call was about patient stated he has stopped drinking "cold [place]". He is asking that Dr. Anitra Lauth not to stop his sleeping pills. He is having trouble sleeping.

## 2019-05-11 NOTE — Telephone Encounter (Signed)
Pt was advised.

## 2019-05-11 NOTE — Telephone Encounter (Signed)
Reassure him that I am not stopping his seroquel that he takes for insomnia. If he is in need of RF then he needs to request this through his pharmacy.-thx

## 2019-05-11 NOTE — Telephone Encounter (Signed)
He takes Seroquel 50mg , 3-4 tabs prn. Please advise, thanks.

## 2019-05-12 ENCOUNTER — Ambulatory Visit: Payer: Self-pay | Admitting: Pharmacist

## 2019-05-12 ENCOUNTER — Telehealth: Payer: Self-pay

## 2019-05-12 DIAGNOSIS — F17210 Nicotine dependence, cigarettes, uncomplicated: Secondary | ICD-10-CM

## 2019-05-12 DIAGNOSIS — N200 Calculus of kidney: Secondary | ICD-10-CM

## 2019-05-12 DIAGNOSIS — Z7901 Long term (current) use of anticoagulants: Secondary | ICD-10-CM

## 2019-05-12 DIAGNOSIS — E876 Hypokalemia: Secondary | ICD-10-CM

## 2019-05-12 DIAGNOSIS — Z1159 Encounter for screening for other viral diseases: Secondary | ICD-10-CM

## 2019-05-12 DIAGNOSIS — E871 Hypo-osmolality and hyponatremia: Secondary | ICD-10-CM

## 2019-05-12 DIAGNOSIS — M1990 Unspecified osteoarthritis, unspecified site: Secondary | ICD-10-CM

## 2019-05-12 DIAGNOSIS — F1910 Other psychoactive substance abuse, uncomplicated: Secondary | ICD-10-CM

## 2019-05-12 DIAGNOSIS — N529 Male erectile dysfunction, unspecified: Secondary | ICD-10-CM

## 2019-05-12 DIAGNOSIS — L0292 Furuncle, unspecified: Secondary | ICD-10-CM

## 2019-05-12 DIAGNOSIS — F419 Anxiety disorder, unspecified: Secondary | ICD-10-CM

## 2019-05-12 DIAGNOSIS — I509 Heart failure, unspecified: Secondary | ICD-10-CM

## 2019-05-12 DIAGNOSIS — F329 Major depressive disorder, single episode, unspecified: Secondary | ICD-10-CM

## 2019-05-12 DIAGNOSIS — H9190 Unspecified hearing loss, unspecified ear: Secondary | ICD-10-CM

## 2019-05-12 DIAGNOSIS — E538 Deficiency of other specified B group vitamins: Secondary | ICD-10-CM

## 2019-05-12 DIAGNOSIS — N410 Acute prostatitis: Secondary | ICD-10-CM

## 2019-05-12 DIAGNOSIS — B1910 Unspecified viral hepatitis B without hepatic coma: Secondary | ICD-10-CM

## 2019-05-12 DIAGNOSIS — M7021 Olecranon bursitis, right elbow: Secondary | ICD-10-CM

## 2019-05-12 DIAGNOSIS — Z79899 Other long term (current) drug therapy: Secondary | ICD-10-CM

## 2019-05-12 DIAGNOSIS — F1010 Alcohol abuse, uncomplicated: Secondary | ICD-10-CM

## 2019-05-12 DIAGNOSIS — Z7951 Long term (current) use of inhaled steroids: Secondary | ICD-10-CM

## 2019-05-12 DIAGNOSIS — I110 Hypertensive heart disease with heart failure: Secondary | ICD-10-CM

## 2019-05-12 DIAGNOSIS — J439 Emphysema, unspecified: Secondary | ICD-10-CM

## 2019-05-12 DIAGNOSIS — I4891 Unspecified atrial fibrillation: Secondary | ICD-10-CM

## 2019-05-12 DIAGNOSIS — Z9181 History of falling: Secondary | ICD-10-CM

## 2019-05-12 NOTE — Telephone Encounter (Signed)
Signed and put in box to go up front.  

## 2019-05-12 NOTE — Telephone Encounter (Signed)
Received [place] certification, PCP will review and sign, if appropriate.

## 2019-05-13 ENCOUNTER — Telehealth: Payer: Self-pay

## 2019-05-13 NOTE — Telephone Encounter (Signed)
Signed and put in box to go up front. Signed:  Crissie Sickles, MD           05/13/2019

## 2019-05-13 NOTE — Telephone Encounter (Signed)
Received form requesting order to discharge from [facility] services. PCP will review and sign, if appropriate.

## 2019-05-14 ENCOUNTER — Ambulatory Visit: Payer: Medicare Other | Admitting: Student

## 2019-05-14 ENCOUNTER — Other Ambulatory Visit: Payer: Self-pay | Admitting: Pharmacist

## 2019-05-14 ENCOUNTER — Ambulatory Visit: Payer: Self-pay | Admitting: Pharmacist

## 2019-05-21 ENCOUNTER — Telehealth: Payer: Self-pay

## 2019-05-21 NOTE — Telephone Encounter (Signed)
Pt called and left voicemail that he believes  New BP med, metoprolol may be causing his feet to swell, clamp up and "shed". He feels weird. He used to take Amlodipine 10 qd prior to ED visit on 04/24/19. Denies rash, redness or any other reactions.   He would like to know if he should continue taking.

## 2019-05-21 NOTE — Telephone Encounter (Signed)
Pt was called and given information, he verbalized understanding  

## 2019-05-21 NOTE — Telephone Encounter (Signed)
The metoprolol is unlikely to cause the feet swelling.  I don't even know what clamp up and "shed" means. I recommend he cut his metoprolol in half and take once a day and see if he feels better. Call if not improved in 10-14d.-thx

## 2019-05-25 NOTE — Patient Outreach (Addendum)
  [place]  05/14/2019 Name: Jacob Frank MRN: [id number] DOB: [birth year]  Referred by: [name], MD Reason for referral : Medication Management    Jacob Frank is a 68 y.o. year old male who is a primary care patient of McGowen, Adrian Blackwater, MD.  Successful outreach call to Mr. Jacob Frank with HIPAA identifiers verified x2.  Patient agreeable to review medications via telephone. Patient paused to retrieve medication bottles.  He stated his medications were filled at his home town pharmacy, [facility].  Patient read off bottles and medication list matches per below.  He does not take her inhalers every day, however encouraged patient to use Advair (BID) and Incruse (QD) everyday as directed to prevent exacerbations.  Fill history does not reflect compliance.  His alcohol abuse appears to be a barrier per chart review.   Patient with recent bacterial infection, therefore encouraged patient to finish antibiotics and prednisone as directed.  He has recently switch from amlodipine to metoprolol for blood pressure/heart rate control.   Medications: Outpatient Encounter Medications as of 05/14/2019  Medication Sig  . ADVAIR DISKUS 250-50 MCG/DOSE AEPB INHALE ONE PUFF EVERY 12 HOURS. (Patient taking differently: Inhale 1 puff into the lungs 2 (two) times a day. )  . albuterol (PROVENTIL) (2.5 MG/3ML) 0.083% nebulizer solution INHALE 1 VIAL VIA NEBULIZER EVERY 6 HOURS AS NEEDED FOR WHEEZING OR SHORTNESS OF BREATH. (Patient taking differently: Take 2.5 mg by nebulization every 6 (six) hours as needed. )  . apixaban (ELIQUIS) 5 MG TABS tablet Take 1 tablet (5 mg total) by mouth 2 (two) times daily.  [name] azithromycin (ZITHROMAX) 250 MG tablet 2 tabs po qd x 1d, then 1 tab po qd x 4d  . cyanocobalamin (,VITAMIN B-12,) 1000 MCG/ML injection Inject 1,000 mcg into the muscle every 30 (thirty) days.  . folic acid (FOLVITE) 1 MG tablet Take 1 tablet (1 mg total) by mouth daily.  . metoprolol succinate  (TOPROL-XL) 200 MG 24 hr tablet Take 1 tablet (200 mg total) by mouth daily. Take with or immediately following a meal.  . Multiple Vitamin (MULTIVITAMIN WITH MINERALS) TABS tablet Take 1 tablet by mouth daily.  . nicotine (NICODERM CQ - DOSED IN MG/24 HOURS) 21 mg/24hr patch Place 1 patch (21 mg total) onto the skin daily.  . pantoprazole (PROTONIX) 20 MG tablet Take 1 tablet (20 mg total) by mouth daily.  . predniSONE (DELTASONE) 20 MG tablet 2 tabs po qd x 7d, then 1 tab po qd x 7d, then 1/2 tab po qd x 8d  . QUEtiapine (SEROQUEL) 50 MG tablet TAKE 3-4 tabs po qhs prn (Patient taking differently: Take 150-200 mg by mouth at bedtime as needed. TAKE 3-4 tabs po qhs prn)  . umeclidinium bromide (INCRUSE ELLIPTA) 62.5 MCG/INH AEPB Inhale 1 puff into the lungs daily.  . VENTOLIN HFA 108 (90 Base) MCG/ACT inhaler 2 PUFFS EVERY FOUR HOURS AS NEEDED FOR WHEEZING (Patient taking differently: Inhale 2 puffs into the lungs every 4 (four) hours as needed. )   No facility-administered encounter medications on file as of 05/14/2019.       PLAN: -Encouraged patient to reach out if needs arise.  Encouraged compliance of medications.   -Case Closed  Regina Eck, PharmD, [place]  [phone]
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
## 2019-06-01 ENCOUNTER — Ambulatory Visit (INDEPENDENT_AMBULATORY_CARE_PROVIDER_SITE_OTHER): Payer: Medicare Other | Admitting: Physician Assistant

## 2019-06-01 ENCOUNTER — Other Ambulatory Visit: Payer: Self-pay

## 2019-06-01 ENCOUNTER — Encounter: Payer: Self-pay | Admitting: Physician Assistant

## 2019-06-01 VITALS — BP 141/104 | HR 78 | Temp 97.3°F | Ht 69.0 in | Wt 179.0 lb

## 2019-06-01 DIAGNOSIS — F1010 Alcohol abuse, uncomplicated: Secondary | ICD-10-CM | POA: Diagnosis not present

## 2019-06-01 DIAGNOSIS — I4891 Unspecified atrial fibrillation: Secondary | ICD-10-CM

## 2019-06-01 DIAGNOSIS — F17200 Nicotine dependence, unspecified, uncomplicated: Secondary | ICD-10-CM | POA: Diagnosis not present

## 2019-06-01 DIAGNOSIS — I10 Essential (primary) hypertension: Secondary | ICD-10-CM

## 2019-06-01 NOTE — Patient Instructions (Signed)
Medication Instructions:  Your physician recommends that you continue on your current medications as directed. Please refer to the Current Medication list given to you today. Please Bring Medicine with you to next visit   Labwork: NONE   Testing/Procedures: NONE   Follow-Up: Your physician recommends that you schedule a follow-up appointment in: 2-3 Month with Dr. Domenic Polite.   Any Other Special Instructions Will Be Listed Below (If Applicable).     If you need a refill on your cardiac medications before your next appointment, please call your pharmacy.  Thank you for choosing [facility]!

## 2019-06-10 ENCOUNTER — Encounter (HOSPITAL_COMMUNITY): Payer: Self-pay

## 2019-06-10 ENCOUNTER — Other Ambulatory Visit: Payer: Self-pay | Admitting: Family Medicine

## 2019-06-10 ENCOUNTER — Emergency Department (HOSPITAL_COMMUNITY)
Admission: EM | Admit: 2019-06-10 | Discharge: 2019-06-10 | Disposition: A | Discharge status: Other Acute Inpatient Hospital | Payer: Medicare Other | Attending: Emergency Medicine | Admitting: Emergency Medicine

## 2019-06-10 ENCOUNTER — Other Ambulatory Visit: Payer: Self-pay

## 2019-06-10 DIAGNOSIS — R45851 Suicidal ideations: Secondary | ICD-10-CM

## 2019-06-10 DIAGNOSIS — Z7901 Long term (current) use of anticoagulants: Secondary | ICD-10-CM | POA: Diagnosis not present

## 2019-06-10 DIAGNOSIS — J449 Chronic obstructive pulmonary disease, unspecified: Secondary | ICD-10-CM | POA: Insufficient documentation

## 2019-06-10 DIAGNOSIS — F17210 Nicotine dependence, cigarettes, uncomplicated: Secondary | ICD-10-CM | POA: Diagnosis not present

## 2019-06-10 DIAGNOSIS — Z79899 Other long term (current) drug therapy: Secondary | ICD-10-CM | POA: Diagnosis not present

## 2019-06-10 DIAGNOSIS — I259 Chronic ischemic heart disease, unspecified: Secondary | ICD-10-CM | POA: Insufficient documentation

## 2019-06-10 DIAGNOSIS — F1024 Alcohol dependence with alcohol-induced mood disorder: Secondary | ICD-10-CM | POA: Insufficient documentation

## 2019-06-10 DIAGNOSIS — I4891 Unspecified atrial fibrillation: Secondary | ICD-10-CM | POA: Diagnosis not present

## 2019-06-10 DIAGNOSIS — Y908 Blood alcohol level of 240 mg/100 ml or more: Secondary | ICD-10-CM | POA: Diagnosis not present

## 2019-06-10 DIAGNOSIS — I10 Essential (primary) hypertension: Secondary | ICD-10-CM | POA: Diagnosis not present

## 2019-06-10 DIAGNOSIS — F1010 Alcohol abuse, uncomplicated: Secondary | ICD-10-CM

## 2019-06-10 DIAGNOSIS — Z046 Encounter for general psychiatric examination, requested by authority: Secondary | ICD-10-CM | POA: Diagnosis present

## 2019-06-10 LAB — CBC
HCT: 40.6 % (ref 39.0–52.0)
Hemoglobin: 13.9 g/dL (ref 13.0–17.0)
MCH: 32.7 pg (ref 26.0–34.0)
MCHC: 34.2 g/dL (ref 30.0–36.0)
MCV: 95.5 fL (ref 80.0–100.0)
Platelets: 229 10*3/uL (ref 150–400)
RBC: 4.25 MIL/uL (ref 4.22–5.81)
RDW: 13.2 % (ref 11.5–15.5)
WBC: 5.4 10*3/uL (ref 4.0–10.5)
nRBC: 0 % (ref 0.0–0.2)

## 2019-06-10 LAB — COMPREHENSIVE METABOLIC PANEL
ALT: 139 U/L — ABNORMAL HIGH (ref 0–44)
AST: 138 U/L — ABNORMAL HIGH (ref 15–41)
Albumin: 4.7 g/dL (ref 3.5–5.0)
Alkaline Phosphatase: 48 U/L (ref 38–126)
Anion gap: 13 (ref 5–15)
BUN: 6 mg/dL — ABNORMAL LOW (ref 8–23)
CO2: 25 mmol/L (ref 22–32)
Calcium: 8.3 mg/dL — ABNORMAL LOW (ref 8.9–10.3)
Chloride: 88 mmol/L — ABNORMAL LOW (ref 98–111)
Creatinine, Ser: 0.65 mg/dL (ref 0.61–1.24)
GFR calc Af Amer: >60 mL/min (ref >60)
GFR calc non Af Amer: >60 mL/min (ref >60)
Glucose, Bld: 89 mg/dL (ref 70–99)
Potassium: 4.4 mmol/L (ref 3.5–5.1)
Sodium: 126 mmol/L — ABNORMAL LOW (ref 135–145)
Total Bilirubin: 1 mg/dL (ref 0.3–1.2)
Total Protein: 7.3 g/dL (ref 6.5–8.1)

## 2019-06-10 LAB — SALICYLATE LEVEL: Salicylate Lvl: <7 mg/dL (ref 2.8–30.0)

## 2019-06-10 LAB — RAPID URINE DRUG SCREEN, HOSP PERFORMED
Amphetamines: NOT DETECTED
Barbiturates: NOT DETECTED
Benzodiazepines: NOT DETECTED
Cocaine: NOT DETECTED
Opiates: NOT DETECTED
Tetrahydrocannabinol: NOT DETECTED

## 2019-06-10 LAB — ETHANOL: Alcohol, Ethyl (B): 267 mg/dL — ABNORMAL HIGH (ref <10)

## 2019-06-10 LAB — ACETAMINOPHEN LEVEL: Acetaminophen (Tylenol), Serum: <10 ug/mL — ABNORMAL LOW (ref 10–30)

## 2019-06-10 MED ORDER — QUETIAPINE FUMARATE 25 MG PO TABS: 150.0000 mg | ORAL_TABLET | Freq: Every evening | ORAL | Status: DC | PRN

## 2019-06-10 MED ORDER — THIAMINE HCL 100 MG/ML IJ SOLN: 100.0000 mg | Freq: Every day | INTRAMUSCULAR | Status: DC

## 2019-06-10 MED ORDER — CYANOCOBALAMIN 1000 MCG/ML IJ SOLN: 1000.0000 ug | INTRAMUSCULAR | Status: DC

## 2019-06-10 MED ORDER — NICOTINE 21 MG/24HR TD PT24
21.0000 mg | MEDICATED_PATCH | Freq: Every day | TRANSDERMAL | Status: DC
  Administered 2019-06-10: 12:00:00 21 mg via TRANSDERMAL
  Filled 2019-06-10: qty 1

## 2019-06-10 MED ORDER — ALBUTEROL SULFATE HFA 108 (90 BASE) MCG/ACT IN AERS: 2.0000 | INHALATION_SPRAY | Freq: Four times a day (QID) | RESPIRATORY_TRACT | Status: DC | PRN

## 2019-06-10 MED ORDER — METOPROLOL SUCCINATE ER 25 MG PO TB24: 200.0000 mg | ORAL_TABLET | Freq: Every day | ORAL | Status: DC

## 2019-06-10 MED ORDER — APIXABAN 5 MG PO TABS: 5.0000 mg | ORAL_TABLET | Freq: Two times a day (BID) | ORAL | Status: DC

## 2019-06-10 MED ORDER — FOLIC ACID 1 MG PO TABS
1.0000 mg | ORAL_TABLET | Freq: Every day | ORAL | Status: DC
  Filled 2019-06-10: qty 1

## 2019-06-10 MED ORDER — VITAMIN B-1 100 MG PO TABS
100.0000 mg | ORAL_TABLET | Freq: Every day | ORAL | Status: DC
  Administered 2019-06-10: 12:00:00 100 mg via ORAL
  Filled 2019-06-10: qty 1

## 2019-06-10 MED ORDER — LORAZEPAM 1 MG PO TABS: 0.0000 mg | ORAL_TABLET | Freq: Two times a day (BID) | ORAL | Status: DC

## 2019-06-10 MED ORDER — PANTOPRAZOLE SODIUM 20 MG PO TBEC: 20.0000 mg | DELAYED_RELEASE_TABLET | Freq: Every day | ORAL | Status: DC

## 2019-06-10 MED ORDER — LORAZEPAM 1 MG PO TABS
0.0000 mg | ORAL_TABLET | Freq: Four times a day (QID) | ORAL | Status: DC
  Administered 2019-06-10: 2 mg via ORAL
  Filled 2019-06-10: qty 2

## 2019-06-10 MED ORDER — LORAZEPAM 2 MG/ML IJ SOLN: 0.0000 mg | Freq: Two times a day (BID) | INTRAMUSCULAR | Status: DC

## 2019-06-10 MED ORDER — LORAZEPAM 2 MG/ML IJ SOLN: 0.0000 mg | Freq: Four times a day (QID) | INTRAMUSCULAR | Status: DC

## 2019-06-10 MED ORDER — PANTOPRAZOLE SODIUM 40 MG PO TBEC
40.0000 mg | DELAYED_RELEASE_TABLET | Freq: Every day | ORAL | Status: DC
  Administered 2019-06-10: 12:00:00 40 mg via ORAL
  Filled 2019-06-10: qty 1

## 2019-06-10 NOTE — Progress Notes (Addendum)
This patient continues to meet inpatient criteria. CSW faxed information to the following facilities:   [facility]- under review, they have requested a copy of patient's IVC to be faxed to [phone] along with COVID test results when available [facility]- under review [facility] [place]  [facility] reviewing for appropriate bed availability.  Patient is currently under IVC, if accepted to a psychiatric facility, he will require sheriff transportation.  Stephanie Acre, [place] Social Worker [phone]

## 2019-06-10 NOTE — Progress Notes (Addendum)
Patient has been accepted to [facility]  Accepting provider: Dr.Cornwall  Bed: [place]  RN Call for report: [phone]  Patient is under IVC and will require sheriff transportation, he may arrive at any time.  Stephanie Acre, [place] Social Worker [phone]
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
## 2019-06-10 NOTE — ED Triage Notes (Addendum)
[employer] office brought pt to er because he called [facility] stating he was going to kill himself.   Pharmacy called [employer] and he went to pt's home and brought him to the hospital.  Officer says pts wife left 5 months ago and he has been drinking excessively since then.

## 2019-06-10 NOTE — ED Notes (Signed)
[employer] in to transport pt

## 2019-06-10 NOTE — ED Notes (Signed)
TTS COMPLETED
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
## 2019-06-18 ENCOUNTER — Encounter: Payer: Self-pay | Admitting: Family Medicine

## 2019-06-29 ENCOUNTER — Other Ambulatory Visit: Payer: Self-pay | Admitting: Family Medicine

## 2019-06-30 ENCOUNTER — Other Ambulatory Visit: Payer: Self-pay | Admitting: Family Medicine

## 2019-06-30 NOTE — Telephone Encounter (Signed)
RF request for Seroquel LOV: 05/18/19 Next ov: advised to f/u as needed Last written: 02/25/19 (120,3)  Please advise, thanks. Medication pending

## 2019-07-17 ENCOUNTER — Encounter: Payer: Self-pay | Admitting: Family Medicine

## 2019-07-17 ENCOUNTER — Other Ambulatory Visit: Payer: Self-pay

## 2019-07-17 ENCOUNTER — Ambulatory Visit (INDEPENDENT_AMBULATORY_CARE_PROVIDER_SITE_OTHER): Payer: Medicare Other | Admitting: Family Medicine

## 2019-07-17 VITALS — BP 134/91 | HR 87 | Temp 98.7°F | Resp 16 | Ht 69.0 in | Wt 180.2 lb

## 2019-07-17 DIAGNOSIS — F1020 Alcohol dependence, uncomplicated: Secondary | ICD-10-CM

## 2019-07-17 DIAGNOSIS — Z23 Encounter for immunization: Secondary | ICD-10-CM

## 2019-07-17 DIAGNOSIS — I4891 Unspecified atrial fibrillation: Secondary | ICD-10-CM

## 2019-07-17 DIAGNOSIS — E538 Deficiency of other specified B group vitamins: Secondary | ICD-10-CM | POA: Diagnosis not present

## 2019-07-17 DIAGNOSIS — Z8719 Personal history of other diseases of the digestive system: Secondary | ICD-10-CM

## 2019-07-17 DIAGNOSIS — G4700 Insomnia, unspecified: Secondary | ICD-10-CM | POA: Diagnosis not present

## 2019-07-17 DIAGNOSIS — J441 Chronic obstructive pulmonary disease with (acute) exacerbation: Secondary | ICD-10-CM | POA: Diagnosis not present

## 2019-07-17 DIAGNOSIS — F3341 Major depressive disorder, recurrent, in partial remission: Secondary | ICD-10-CM | POA: Diagnosis not present

## 2019-07-17 DIAGNOSIS — E871 Hypo-osmolality and hyponatremia: Secondary | ICD-10-CM

## 2019-07-17 MED ORDER — BUPROPION HCL ER (XL) 150 MG PO TB24: 150.0000 mg | ORAL_TABLET | Freq: Every day | ORAL | 1 refills | Status: DC

## 2019-07-17 MED ORDER — CYANOCOBALAMIN 1000 MCG/ML IJ SOLN
1000.0000 ug | Freq: Once | INTRAMUSCULAR | Status: AC
  Administered 2019-07-17: 1000 ug via INTRAMUSCULAR
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
## 2019-07-22 ENCOUNTER — Other Ambulatory Visit: Payer: Self-pay | Admitting: Family Medicine

## 2019-07-22 NOTE — Telephone Encounter (Signed)
RF request for amlodipine.  Not recently prescribed by Dr Anitra Lauth. Please advise if rf is appropriate?

## 2019-07-28 ENCOUNTER — Telehealth: Payer: Self-pay | Admitting: Family Medicine

## 2019-07-28 NOTE — Telephone Encounter (Signed)
Patient has a knot on the spot on his arm that he got his flu shot last week and he has a scab on the injection site for the B12. Please call patient back. Advised it would be tomorrow before Dr. Idelle Leech CMA would be available.

## 2019-07-28 NOTE — Telephone Encounter (Signed)
Called patient and he said that he no longer has a knot on his right arm but he does have a scab on his left arm where we gave him his B12 shot. Patient said that area is not warm and it is not draining. Patient is concerned and wants to know what he can do? Please advise

## 2019-07-28 NOTE — Telephone Encounter (Signed)
Just monitor it for gradual resolution.

## 2019-07-29 NOTE — Telephone Encounter (Signed)
Called patient and told his to monitor area. Patient verbalized understanding

## 2019-08-04 ENCOUNTER — Ambulatory Visit: Payer: Medicare Other | Admitting: Cardiology

## 2019-08-06 ENCOUNTER — Other Ambulatory Visit: Payer: Self-pay | Admitting: Family Medicine

## 2019-08-25 ENCOUNTER — Telehealth: Payer: Self-pay | Admitting: Family Medicine

## 2019-08-25 ENCOUNTER — Other Ambulatory Visit: Payer: Self-pay

## 2019-08-25 MED ORDER — PANTOPRAZOLE SODIUM 40 MG PO TBEC: 40.0000 mg | DELAYED_RELEASE_TABLET | Freq: Every day | ORAL | 6 refills | Status: DC

## 2019-08-25 NOTE — Telephone Encounter (Signed)
Rx sent in, pt was advised.

## 2019-08-25 NOTE — Telephone Encounter (Signed)
Pt was started on pantoprazole during ED visit on 04/24/19. Rx given for (30,2).   Please advise on refill, thanks.

## 2019-08-25 NOTE — Telephone Encounter (Signed)
Pls rx pantoprazole 40mg , 1 tab po qd, #30, RF x 6

## 2019-08-25 NOTE — Telephone Encounter (Signed)
Patient refill request  pantoprazole (PROTONIX) 20 MG tablet  [facility] - [place], [place]

## 2019-08-26 ENCOUNTER — Ambulatory Visit: Payer: Medicare Other

## 2019-09-01 ENCOUNTER — Other Ambulatory Visit: Payer: Self-pay

## 2019-09-01 ENCOUNTER — Telehealth: Payer: Self-pay | Admitting: Family Medicine

## 2019-09-01 ENCOUNTER — Ambulatory Visit (INDEPENDENT_AMBULATORY_CARE_PROVIDER_SITE_OTHER): Payer: Medicare Other | Admitting: Family Medicine

## 2019-09-01 DIAGNOSIS — E538 Deficiency of other specified B group vitamins: Secondary | ICD-10-CM

## 2019-09-01 MED ORDER — CYANOCOBALAMIN 1000 MCG/ML IJ SOLN
1000.0000 ug | Freq: Once | INTRAMUSCULAR | Status: AC
  Administered 2019-09-01: 1000 ug via INTRAMUSCULAR

## 2019-09-01 NOTE — Telephone Encounter (Signed)
Pt was called, no VM set up, no answer

## 2019-09-01 NOTE — Telephone Encounter (Signed)
  He does not require any assistance with bathing, dressing, mobility, toileting, or eating. He does not qualify for personal care services.

## 2019-09-01 NOTE — Telephone Encounter (Signed)
Patient dropped off form for Dr Anitra Lauth to fill out for him to get assistance with cooking and cleaning in his home.   There is an envelop attached with information on where to send form or phone number for any questions.  Form placed on Brittanae's desk.

## 2019-09-01 NOTE — Telephone Encounter (Signed)
PCP will review and sign, if appropriate.  

## 2019-09-01 NOTE — Progress Notes (Signed)
  ZYQUEZ SCARFONE is a 67 y.o. male presents to the office today for b12  injections, per physician's verbal orders.  Vitamin b12 1020mcg IM was administered right deltoid today. Patient tolerated injection.  Patient next injection due: 10/02/2019, appt made No  Lattie Haw, CMA

## 2019-09-02 ENCOUNTER — Telehealth: Payer: Self-pay | Admitting: Family Medicine

## 2019-09-02 NOTE — Progress Notes (Signed)
Pt with vit B12 deficiency.  Agree with vit B12 1000 mcg IM in office today. Signed:  Crissie Sickles, MD           09/02/2019

## 2019-09-02 NOTE — Telephone Encounter (Signed)
Tried calling patient regarding pt assistance paperwork. No VM set up, no answer

## 2019-09-02 NOTE — Telephone Encounter (Signed)
Returning call from yesterday. He is not sure who called.  Patient can be reached at [phone]

## 2019-09-03 NOTE — Telephone Encounter (Signed)
Tried calling pt, call did not go through.

## 2019-09-03 NOTE — Telephone Encounter (Signed)
Patient advised and voiced understanding.  

## 2019-09-08 ENCOUNTER — Other Ambulatory Visit: Payer: Self-pay | Admitting: Family Medicine

## 2019-09-09 ENCOUNTER — Other Ambulatory Visit: Payer: Self-pay | Admitting: Family Medicine

## 2019-09-18 ENCOUNTER — Encounter: Payer: Self-pay | Admitting: Family Medicine

## 2019-09-18 ENCOUNTER — Ambulatory Visit (INDEPENDENT_AMBULATORY_CARE_PROVIDER_SITE_OTHER): Payer: Medicare Other | Admitting: Family Medicine

## 2019-09-18 ENCOUNTER — Other Ambulatory Visit: Payer: Self-pay

## 2019-09-18 VITALS — BP 125/85 | HR 70 | Temp 98.6°F | Resp 16 | Ht 69.0 in | Wt 186.8 lb

## 2019-09-18 DIAGNOSIS — F330 Major depressive disorder, recurrent, mild: Secondary | ICD-10-CM | POA: Diagnosis not present

## 2019-09-18 DIAGNOSIS — E538 Deficiency of other specified B group vitamins: Secondary | ICD-10-CM | POA: Diagnosis not present

## 2019-09-18 DIAGNOSIS — I480 Paroxysmal atrial fibrillation: Secondary | ICD-10-CM

## 2019-09-18 DIAGNOSIS — I10 Essential (primary) hypertension: Secondary | ICD-10-CM | POA: Diagnosis not present

## 2019-09-18 DIAGNOSIS — F411 Generalized anxiety disorder: Secondary | ICD-10-CM

## 2019-09-18 DIAGNOSIS — F1020 Alcohol dependence, uncomplicated: Secondary | ICD-10-CM

## 2019-09-18 DIAGNOSIS — J449 Chronic obstructive pulmonary disease, unspecified: Secondary | ICD-10-CM

## 2019-09-18 DIAGNOSIS — R7401 Elevation of levels of liver transaminase levels: Secondary | ICD-10-CM | POA: Diagnosis not present

## 2019-09-18 LAB — LIPID PANEL
Cholesterol: 187 mg/dL (ref 0–200)
HDL: 95.6 mg/dL (ref >39.00)
LDL Cholesterol: 70 mg/dL (ref 0–99)
NonHDL: 91.25
Total CHOL/HDL Ratio: 2
Triglycerides: 106 mg/dL (ref 0.0–149.0)
VLDL: 21.2 mg/dL (ref 0.0–40.0)

## 2019-09-18 LAB — COMPREHENSIVE METABOLIC PANEL
ALT: 12 U/L (ref 0–53)
AST: 14 U/L (ref 0–37)
Albumin: 4.7 g/dL (ref 3.5–5.2)
Alkaline Phosphatase: 46 U/L (ref 39–117)
BUN: 11 mg/dL (ref 6–23)
CO2: 28 mEq/L (ref 19–32)
Calcium: 9.5 mg/dL (ref 8.4–10.5)
Chloride: 97 mEq/L (ref 96–112)
Creatinine, Ser: 0.92 mg/dL (ref 0.40–1.50)
GFR: 82.03 mL/min (ref >60.00)
Glucose, Bld: 91 mg/dL (ref 70–99)
Potassium: 4.7 mEq/L (ref 3.5–5.1)
Sodium: 133 mEq/L — ABNORMAL LOW (ref 135–145)
Total Bilirubin: 0.7 mg/dL (ref 0.2–1.2)
Total Protein: 7.1 g/dL (ref 6.0–8.3)

## 2019-09-18 MED ORDER — CYANOCOBALAMIN 1000 MCG/ML IJ SOLN
1000.0000 ug | Freq: Once | INTRAMUSCULAR | Status: AC
  Administered 2019-09-18: 1000 ug via INTRAMUSCULAR

## 2019-09-18 MED ORDER — BUPROPION HCL ER (XL) 300 MG PO TB24: 300.0000 mg | ORAL_TABLET | Freq: Every day | ORAL | 2 refills | Status: DC
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
## 2019-09-21 ENCOUNTER — Encounter: Payer: Self-pay | Admitting: Family Medicine

## 2019-09-22 ENCOUNTER — Encounter: Payer: Self-pay | Admitting: Family Medicine

## 2019-10-07 ENCOUNTER — Other Ambulatory Visit: Payer: Self-pay | Admitting: Family Medicine

## 2019-10-08 NOTE — Telephone Encounter (Signed)
RF request from pharmacy for incruse ellipta inhaler.  Last RF was 07/16/2018 # 30 x 5 rf.  Last reported, No taking per patient 09/18/2019.    Please advise?

## 2019-11-19 ENCOUNTER — Ambulatory Visit: Payer: Medicare Other | Admitting: Family Medicine

## 2019-11-20 ENCOUNTER — Other Ambulatory Visit: Payer: Self-pay | Admitting: Family Medicine

## 2019-11-20 NOTE — Telephone Encounter (Signed)
RF request for Metoprolol LOV:09/18/19 Next ov: 11/26/19 Last written:04/29/19(30,2) written by ED MD

## 2019-11-26 ENCOUNTER — Other Ambulatory Visit: Payer: Self-pay

## 2019-11-26 ENCOUNTER — Ambulatory Visit (INDEPENDENT_AMBULATORY_CARE_PROVIDER_SITE_OTHER): Payer: Medicare HMO | Admitting: Family Medicine

## 2019-11-26 ENCOUNTER — Encounter: Payer: Self-pay | Admitting: Family Medicine

## 2019-11-26 VITALS — BP 99/66 | HR 74 | Temp 98.6°F | Ht 69.0 in | Wt 188.0 lb

## 2019-11-26 DIAGNOSIS — F411 Generalized anxiety disorder: Secondary | ICD-10-CM

## 2019-11-26 DIAGNOSIS — G4700 Insomnia, unspecified: Secondary | ICD-10-CM

## 2019-11-26 DIAGNOSIS — E538 Deficiency of other specified B group vitamins: Secondary | ICD-10-CM

## 2019-11-26 DIAGNOSIS — F4321 Adjustment disorder with depressed mood: Secondary | ICD-10-CM

## 2019-11-26 DIAGNOSIS — I480 Paroxysmal atrial fibrillation: Secondary | ICD-10-CM

## 2019-11-26 DIAGNOSIS — J449 Chronic obstructive pulmonary disease, unspecified: Secondary | ICD-10-CM | POA: Diagnosis not present

## 2019-11-26 MED ORDER — CYANOCOBALAMIN 1000 MCG/ML IJ SOLN
1000.0000 ug | Freq: Once | INTRAMUSCULAR | Status: AC
  Administered 2019-11-26: 10:00:00 1000 ug via INTRAMUSCULAR
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
## 2019-12-08 ENCOUNTER — Ambulatory Visit: Payer: Medicare HMO | Attending: Internal Medicine

## 2019-12-08 ENCOUNTER — Telehealth: Payer: Self-pay

## 2019-12-08 DIAGNOSIS — Z23 Encounter for immunization: Secondary | ICD-10-CM

## 2019-12-08 NOTE — Progress Notes (Signed)
   Covid-19 Vaccination Clinic  Name:  Jacob Frank    MRN: [id number] DOB: 07/21/1951  12/08/2019  Mr. Verdi was observed post Covid-19 immunization for 15 minutes without incident. He was provided with Vaccine Information Sheet and instruction to access the V-Safe system.   Mr. Adriance was instructed to call 911 with any severe reactions post vaccine: [name] Difficulty breathing  . Swelling of face and throat  . A fast heartbeat  . A bad rash all over body  . Dizziness and weakness   Immunizations Administered    Name Date Dose VIS Date Route   Moderna COVID-19 Vaccine 12/08/2019  1:24 PM 0.5 mL 09/01/2019 Intramuscular   Manufacturer: Moderna   Lot: [id number]   [id number]

## 2019-12-08 NOTE — Telephone Encounter (Signed)
Patient received his first [place] vaccine today. He is not having adverse reactions.

## 2019-12-08 NOTE — Telephone Encounter (Signed)
Pt's chart updated already. Nothing further needed.

## 2019-12-18 ENCOUNTER — Other Ambulatory Visit: Payer: Self-pay | Admitting: Family Medicine

## 2020-01-12 ENCOUNTER — Other Ambulatory Visit: Payer: Self-pay | Admitting: Family Medicine

## 2020-01-12 NOTE — Telephone Encounter (Signed)
RF request for Seroquel LOV:11/26/19 Next ov: advised to f/u 6 mo. Last written: 06/30/19(120,6)  Please advise. Medication pending

## 2020-01-13 NOTE — Telephone Encounter (Signed)
Rf sent

## 2020-01-19 ENCOUNTER — Other Ambulatory Visit: Payer: Self-pay | Admitting: Family Medicine

## 2020-02-24 ENCOUNTER — Ambulatory Visit (INDEPENDENT_AMBULATORY_CARE_PROVIDER_SITE_OTHER): Payer: Medicare HMO | Admitting: Family Medicine

## 2020-02-24 ENCOUNTER — Encounter: Payer: Self-pay | Admitting: Family Medicine

## 2020-02-24 ENCOUNTER — Other Ambulatory Visit: Payer: Self-pay

## 2020-02-24 VITALS — BP 118/82 | HR 70 | Temp 98.0°F | Resp 16 | Ht 69.0 in | Wt 187.8 lb

## 2020-02-24 DIAGNOSIS — J449 Chronic obstructive pulmonary disease, unspecified: Secondary | ICD-10-CM

## 2020-02-24 DIAGNOSIS — J441 Chronic obstructive pulmonary disease with (acute) exacerbation: Secondary | ICD-10-CM

## 2020-02-24 DIAGNOSIS — R972 Elevated prostate specific antigen [PSA]: Secondary | ICD-10-CM

## 2020-02-24 DIAGNOSIS — I480 Paroxysmal atrial fibrillation: Secondary | ICD-10-CM | POA: Diagnosis not present

## 2020-02-24 DIAGNOSIS — Z8619 Personal history of other infectious and parasitic diseases: Secondary | ICD-10-CM | POA: Diagnosis not present

## 2020-02-24 DIAGNOSIS — Z7901 Long term (current) use of anticoagulants: Secondary | ICD-10-CM | POA: Diagnosis not present

## 2020-02-24 DIAGNOSIS — F411 Generalized anxiety disorder: Secondary | ICD-10-CM

## 2020-02-24 DIAGNOSIS — F330 Major depressive disorder, recurrent, mild: Secondary | ICD-10-CM | POA: Diagnosis not present

## 2020-02-24 DIAGNOSIS — Z125 Encounter for screening for malignant neoplasm of prostate: Secondary | ICD-10-CM | POA: Diagnosis not present

## 2020-02-24 DIAGNOSIS — I10 Essential (primary) hypertension: Secondary | ICD-10-CM | POA: Diagnosis not present

## 2020-02-24 DIAGNOSIS — E538 Deficiency of other specified B group vitamins: Secondary | ICD-10-CM | POA: Diagnosis not present

## 2020-02-24 LAB — COMPREHENSIVE METABOLIC PANEL
ALT: 14 U/L (ref 0–53)
AST: 15 U/L (ref 0–37)
Albumin: 4.5 g/dL (ref 3.5–5.2)
Alkaline Phosphatase: 44 U/L (ref 39–117)
BUN: 9 mg/dL (ref 6–23)
CO2: 27 mEq/L (ref 19–32)
Calcium: 9.3 mg/dL (ref 8.4–10.5)
Chloride: 87 mEq/L — ABNORMAL LOW (ref 96–112)
Creatinine, Ser: 0.87 mg/dL (ref 0.40–1.50)
GFR: 87.38 mL/min (ref >60.00)
Glucose, Bld: 71 mg/dL (ref 70–99)
Potassium: 5.1 mEq/L (ref 3.5–5.1)
Sodium: 123 mEq/L — ABNORMAL LOW (ref 135–145)
Total Bilirubin: 0.6 mg/dL (ref 0.2–1.2)
Total Protein: 7 g/dL (ref 6.0–8.3)

## 2020-02-24 LAB — CBC
HCT: 40.4 % (ref 39.0–52.0)
Hemoglobin: 13.9 g/dL (ref 13.0–17.0)
MCHC: 34.4 g/dL (ref 30.0–36.0)
MCV: 95.2 fl (ref 78.0–100.0)
Platelets: 331 10*3/uL (ref 150.0–400.0)
RBC: 4.25 Mil/uL (ref 4.22–5.81)
RDW: 14.2 % (ref 11.5–15.5)
WBC: 6.3 10*3/uL (ref 4.0–10.5)

## 2020-02-24 LAB — PSA, MEDICARE: PSA: 19.68 ng/ml — ABNORMAL HIGH (ref 0.10–4.00)

## 2020-02-24 LAB — VITAMIN B12: Vitamin B-12: 260 pg/mL (ref 211–911)

## 2020-02-24 MED ORDER — CYANOCOBALAMIN 1000 MCG/ML IJ SOLN
1000.0000 ug | Freq: Once | INTRAMUSCULAR | Status: AC
  Administered 2020-02-24: 1000 ug via INTRAMUSCULAR

## 2020-02-24 MED ORDER — PREDNISONE 20 MG PO TABS: ORAL_TABLET | ORAL | 0 refills | Status: DC

## 2020-02-24 MED ORDER — AZITHROMYCIN 250 MG PO TABS: ORAL_TABLET | ORAL | 0 refills | Status: DC
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
## 2020-03-01 ENCOUNTER — Telehealth: Payer: Self-pay

## 2020-03-01 NOTE — Telephone Encounter (Signed)
Patient called in about lab results that he received. Wanted to speak with nurse about the results and what he has decided to do    Please call and advise

## 2020-03-01 NOTE — Telephone Encounter (Signed)
Pt returned call and said he would go to Urologist and would want to go somewhere in [place] or [place] if able.

## 2020-03-02 ENCOUNTER — Other Ambulatory Visit: Payer: Self-pay

## 2020-03-02 DIAGNOSIS — R972 Elevated prostate specific antigen [PSA]: Secondary | ICD-10-CM

## 2020-03-02 NOTE — Telephone Encounter (Signed)
Referral entered, patient notified he should receive call with scheduling details.

## 2020-03-02 NOTE — Telephone Encounter (Signed)
Pls order ref to urologist for dx of elevated PSA. Any urologist in [place] or [place], .-thx

## 2020-03-25 ENCOUNTER — Emergency Department (HOSPITAL_COMMUNITY): Payer: Medicare HMO

## 2020-03-25 ENCOUNTER — Inpatient Hospital Stay (HOSPITAL_COMMUNITY)
Admission: AD | Admit: 2020-03-25 | Discharge: 2020-03-30 | DRG: 682 | Disposition: A | Discharge status: Home Health | Payer: Medicare HMO | Attending: Internal Medicine | Admitting: Internal Medicine

## 2020-03-25 ENCOUNTER — Encounter (HOSPITAL_COMMUNITY): Payer: Self-pay | Admitting: Family Medicine

## 2020-03-25 ENCOUNTER — Inpatient Hospital Stay (HOSPITAL_COMMUNITY): Payer: Medicare HMO

## 2020-03-25 ENCOUNTER — Other Ambulatory Visit: Payer: Self-pay

## 2020-03-25 DIAGNOSIS — F411 Generalized anxiety disorder: Secondary | ICD-10-CM | POA: Diagnosis present

## 2020-03-25 DIAGNOSIS — I4891 Unspecified atrial fibrillation: Secondary | ICD-10-CM | POA: Diagnosis not present

## 2020-03-25 DIAGNOSIS — G9340 Encephalopathy, unspecified: Secondary | ICD-10-CM | POA: Diagnosis not present

## 2020-03-25 DIAGNOSIS — F329 Major depressive disorder, single episode, unspecified: Secondary | ICD-10-CM | POA: Diagnosis present

## 2020-03-25 DIAGNOSIS — R680 Hypothermia, not associated with low environmental temperature: Secondary | ICD-10-CM | POA: Diagnosis present

## 2020-03-25 DIAGNOSIS — J90 Pleural effusion, not elsewhere classified: Secondary | ICD-10-CM | POA: Diagnosis not present

## 2020-03-25 DIAGNOSIS — Z7951 Long term (current) use of inhaled steroids: Secondary | ICD-10-CM

## 2020-03-25 DIAGNOSIS — K5900 Constipation, unspecified: Secondary | ICD-10-CM | POA: Diagnosis present

## 2020-03-25 DIAGNOSIS — I482 Chronic atrial fibrillation, unspecified: Secondary | ICD-10-CM | POA: Diagnosis not present

## 2020-03-25 DIAGNOSIS — N179 Acute kidney failure, unspecified: Secondary | ICD-10-CM | POA: Diagnosis not present

## 2020-03-25 DIAGNOSIS — R972 Elevated prostate specific antigen [PSA]: Secondary | ICD-10-CM | POA: Diagnosis not present

## 2020-03-25 DIAGNOSIS — M6282 Rhabdomyolysis: Secondary | ICD-10-CM | POA: Diagnosis not present

## 2020-03-25 DIAGNOSIS — R410 Disorientation, unspecified: Secondary | ICD-10-CM | POA: Diagnosis not present

## 2020-03-25 DIAGNOSIS — Z79899 Other long term (current) drug therapy: Secondary | ICD-10-CM

## 2020-03-25 DIAGNOSIS — J449 Chronic obstructive pulmonary disease, unspecified: Secondary | ICD-10-CM | POA: Diagnosis not present

## 2020-03-25 DIAGNOSIS — F17210 Nicotine dependence, cigarettes, uncomplicated: Secondary | ICD-10-CM | POA: Diagnosis not present

## 2020-03-25 DIAGNOSIS — N401 Enlarged prostate with lower urinary tract symptoms: Secondary | ICD-10-CM | POA: Diagnosis present

## 2020-03-25 DIAGNOSIS — F1010 Alcohol abuse, uncomplicated: Secondary | ICD-10-CM | POA: Diagnosis present

## 2020-03-25 DIAGNOSIS — E559 Vitamin D deficiency, unspecified: Secondary | ICD-10-CM | POA: Diagnosis present

## 2020-03-25 DIAGNOSIS — D72829 Elevated white blood cell count, unspecified: Secondary | ICD-10-CM | POA: Diagnosis present

## 2020-03-25 DIAGNOSIS — E878 Other disorders of electrolyte and fluid balance, not elsewhere classified: Secondary | ICD-10-CM | POA: Diagnosis not present

## 2020-03-25 DIAGNOSIS — J440 Chronic obstructive pulmonary disease with acute lower respiratory infection: Secondary | ICD-10-CM | POA: Diagnosis present

## 2020-03-25 DIAGNOSIS — F17200 Nicotine dependence, unspecified, uncomplicated: Secondary | ICD-10-CM | POA: Diagnosis not present

## 2020-03-25 DIAGNOSIS — N19 Unspecified kidney failure: Secondary | ICD-10-CM | POA: Diagnosis not present

## 2020-03-25 DIAGNOSIS — R338 Other retention of urine: Secondary | ICD-10-CM | POA: Diagnosis present

## 2020-03-25 DIAGNOSIS — I10 Essential (primary) hypertension: Secondary | ICD-10-CM | POA: Diagnosis present

## 2020-03-25 DIAGNOSIS — R531 Weakness: Secondary | ICD-10-CM | POA: Diagnosis not present

## 2020-03-25 DIAGNOSIS — R4182 Altered mental status, unspecified: Secondary | ICD-10-CM | POA: Diagnosis not present

## 2020-03-25 DIAGNOSIS — F10239 Alcohol dependence with withdrawal, unspecified: Secondary | ICD-10-CM | POA: Diagnosis present

## 2020-03-25 DIAGNOSIS — Z7901 Long term (current) use of anticoagulants: Secondary | ICD-10-CM | POA: Diagnosis not present

## 2020-03-25 DIAGNOSIS — E871 Hypo-osmolality and hyponatremia: Secondary | ICD-10-CM | POA: Diagnosis present

## 2020-03-25 DIAGNOSIS — J209 Acute bronchitis, unspecified: Secondary | ICD-10-CM | POA: Diagnosis present

## 2020-03-25 DIAGNOSIS — R0600 Dyspnea, unspecified: Secondary | ICD-10-CM

## 2020-03-25 DIAGNOSIS — Z20822 Contact with and (suspected) exposure to covid-19: Secondary | ICD-10-CM | POA: Diagnosis not present

## 2020-03-25 DIAGNOSIS — R339 Retention of urine, unspecified: Secondary | ICD-10-CM | POA: Diagnosis present

## 2020-03-25 DIAGNOSIS — G9341 Metabolic encephalopathy: Secondary | ICD-10-CM | POA: Diagnosis not present

## 2020-03-25 DIAGNOSIS — T68XXXA Hypothermia, initial encounter: Secondary | ICD-10-CM | POA: Diagnosis not present

## 2020-03-25 DIAGNOSIS — M4312 Spondylolisthesis, cervical region: Secondary | ICD-10-CM | POA: Diagnosis not present

## 2020-03-25 DIAGNOSIS — J9811 Atelectasis: Secondary | ICD-10-CM | POA: Diagnosis not present

## 2020-03-25 DIAGNOSIS — M50322 Other cervical disc degeneration at C5-C6 level: Secondary | ICD-10-CM | POA: Diagnosis not present

## 2020-03-25 LAB — URINALYSIS, ROUTINE W REFLEX MICROSCOPIC
Bacteria, UA: NONE SEEN
Bilirubin Urine: NEGATIVE
Glucose, UA: NEGATIVE mg/dL
Ketones, ur: NEGATIVE mg/dL
Nitrite: NEGATIVE
Protein, ur: NEGATIVE mg/dL
Specific Gravity, Urine: 1.011 (ref 1.005–1.030)
pH: 5 (ref 5.0–8.0)

## 2020-03-25 LAB — COMPREHENSIVE METABOLIC PANEL
ALT: 40 U/L (ref 0–44)
ALT: 30 U/L (ref 0–44)
AST: 59 U/L — ABNORMAL HIGH (ref 15–41)
AST: 38 U/L (ref 15–41)
Albumin: 3.8 g/dL (ref 3.5–5.0)
Albumin: 2.8 g/dL — ABNORMAL LOW (ref 3.5–5.0)
Alkaline Phosphatase: 50 U/L (ref 38–126)
Alkaline Phosphatase: 38 U/L (ref 38–126)
Anion gap: >20 — ABNORMAL HIGH (ref 5–15)
Anion gap: 13 (ref 5–15)
BUN: 99 mg/dL — ABNORMAL HIGH (ref 8–23)
BUN: 55 mg/dL — ABNORMAL HIGH (ref 8–23)
CO2: 17 mmol/L — ABNORMAL LOW (ref 22–32)
CO2: 18 mmol/L — ABNORMAL LOW (ref 22–32)
Calcium: 8.9 mg/dL (ref 8.9–10.3)
Calcium: 7.2 mg/dL — ABNORMAL LOW (ref 8.9–10.3)
Chloride: 81 mmol/L — ABNORMAL LOW (ref 98–111)
Chloride: 97 mmol/L — ABNORMAL LOW (ref 98–111)
Creatinine, Ser: 4.4 mg/dL — ABNORMAL HIGH (ref 0.61–1.24)
Creatinine, Ser: 1.73 mg/dL — ABNORMAL HIGH (ref 0.61–1.24)
GFR calc Af Amer: 15 mL/min — ABNORMAL LOW (ref >60)
GFR calc Af Amer: 46 mL/min — ABNORMAL LOW (ref >60)
GFR calc non Af Amer: 13 mL/min — ABNORMAL LOW (ref >60)
GFR calc non Af Amer: 40 mL/min — ABNORMAL LOW (ref >60)
Glucose, Bld: 70 mg/dL (ref 70–99)
Glucose, Bld: 179 mg/dL — ABNORMAL HIGH (ref 70–99)
Potassium: 4.5 mmol/L (ref 3.5–5.1)
Potassium: 3.3 mmol/L — ABNORMAL LOW (ref 3.5–5.1)
Sodium: 122 mmol/L — ABNORMAL LOW (ref 135–145)
Sodium: 128 mmol/L — ABNORMAL LOW (ref 135–145)
Total Bilirubin: 1.9 mg/dL — ABNORMAL HIGH (ref 0.3–1.2)
Total Bilirubin: 1.2 mg/dL (ref 0.3–1.2)
Total Protein: 7.7 g/dL (ref 6.5–8.1)
Total Protein: 5.7 g/dL — ABNORMAL LOW (ref 6.5–8.1)

## 2020-03-25 LAB — CBC WITH DIFFERENTIAL/PLATELET
Abs Immature Granulocytes: 0.26 10*3/uL — ABNORMAL HIGH (ref 0.00–0.07)
Basophils Absolute: 0 10*3/uL (ref 0.0–0.1)
Basophils Relative: 0 %
Eosinophils Absolute: 0 10*3/uL (ref 0.0–0.5)
Eosinophils Relative: 0 %
HCT: 36.8 % — ABNORMAL LOW (ref 39.0–52.0)
Hemoglobin: 12.6 g/dL — ABNORMAL LOW (ref 13.0–17.0)
Immature Granulocytes: 2 %
Lymphocytes Relative: 2 %
Lymphs Abs: 0.3 10*3/uL — ABNORMAL LOW (ref 0.7–4.0)
MCH: 32.6 pg (ref 26.0–34.0)
MCHC: 34.2 g/dL (ref 30.0–36.0)
MCV: 95.1 fL (ref 80.0–100.0)
Monocytes Absolute: 1 10*3/uL (ref 0.1–1.0)
Monocytes Relative: 6 %
Neutro Abs: 13.9 10*3/uL — ABNORMAL HIGH (ref 1.7–7.7)
Neutrophils Relative %: 90 %
Platelets: 218 10*3/uL (ref 150–400)
RBC: 3.87 MIL/uL — ABNORMAL LOW (ref 4.22–5.81)
RDW: 12.9 % (ref 11.5–15.5)
WBC: 15.5 10*3/uL — ABNORMAL HIGH (ref 4.0–10.5)
nRBC: 0 % (ref 0.0–0.2)

## 2020-03-25 LAB — RAPID URINE DRUG SCREEN, HOSP PERFORMED
Amphetamines: NOT DETECTED
Barbiturates: NOT DETECTED
Benzodiazepines: NOT DETECTED
Cocaine: NOT DETECTED
Opiates: NOT DETECTED
Tetrahydrocannabinol: NOT DETECTED

## 2020-03-25 LAB — LIPASE, BLOOD: Lipase: 76 U/L — ABNORMAL HIGH (ref 11–51)

## 2020-03-25 LAB — PROTIME-INR
INR: 1.1 (ref 0.8–1.2)
Prothrombin Time: 13.7 seconds (ref 11.4–15.2)

## 2020-03-25 LAB — PHOSPHORUS: Phosphorus: 2.6 mg/dL (ref 2.5–4.6)

## 2020-03-25 LAB — MRSA PCR SCREENING: MRSA by PCR: POSITIVE — AB

## 2020-03-25 LAB — AMMONIA: Ammonia: 14 umol/L (ref 9–35)

## 2020-03-25 LAB — TSH: TSH: 0.489 u[IU]/mL (ref 0.350–4.500)

## 2020-03-25 LAB — MAGNESIUM: Magnesium: 1.9 mg/dL (ref 1.7–2.4)

## 2020-03-25 LAB — ETHANOL: Alcohol, Ethyl (B): <10 mg/dL (ref <10)

## 2020-03-25 LAB — CK: Total CK: 1463 U/L — ABNORMAL HIGH (ref 49–397)

## 2020-03-25 LAB — LACTIC ACID, PLASMA
Lactic Acid, Venous: 1.4 mmol/L (ref 0.5–1.9)
Lactic Acid, Venous: 1.6 mmol/L (ref 0.5–1.9)

## 2020-03-25 LAB — SARS CORONAVIRUS 2 BY RT PCR (HOSPITAL ORDER, PERFORMED IN ~~LOC~~ HOSPITAL LAB): SARS Coronavirus 2: NEGATIVE

## 2020-03-25 IMAGING — CT CT CERVICAL SPINE W/O CM
3 of 4 series · 13 of 35 positions shown, 16 images · non-contrast
Comparison: None.

CLINICAL DATA: Altered mental status.

EXAM:
CT HEAD WITHOUT CONTRAST
CT CERVICAL SPINE WITHOUT CONTRAST
TECHNIQUE: Multidetector CT imaging of the head and cervical spine was
performed following the standard protocol without intravenous
contrast. Multiplanar CT image reconstructions of the cervical spine
were also generated.

[Series 5: sagittal bone · sagittal · 0.31mm/px · 5 of 61 slices shown, 6 images]
[im 21/61  bone]
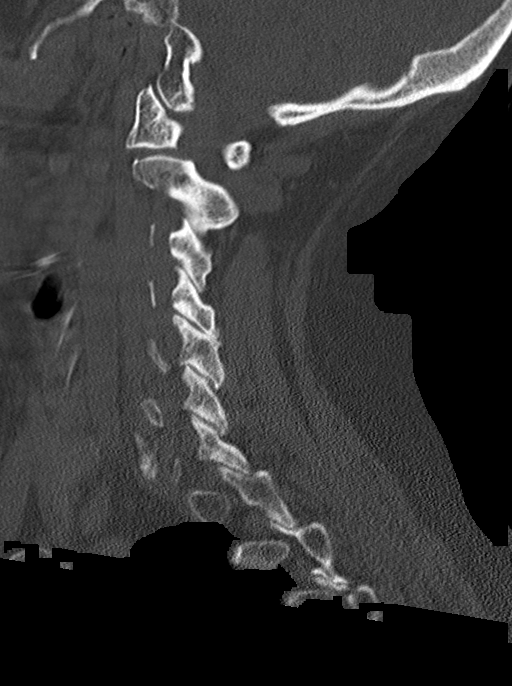
[im 26/61  bone]
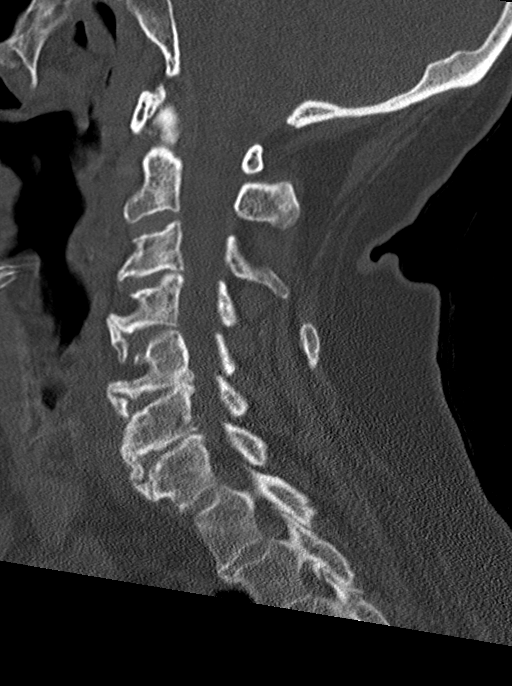
[im 31/61  soft-tissue]
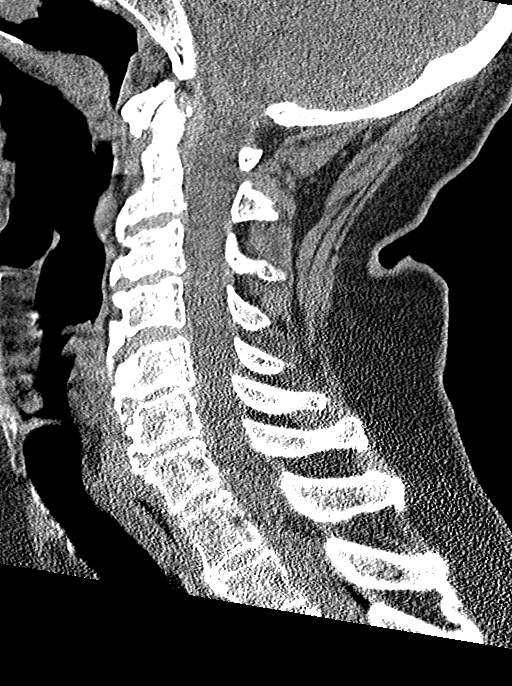
[im 31/61  bone]
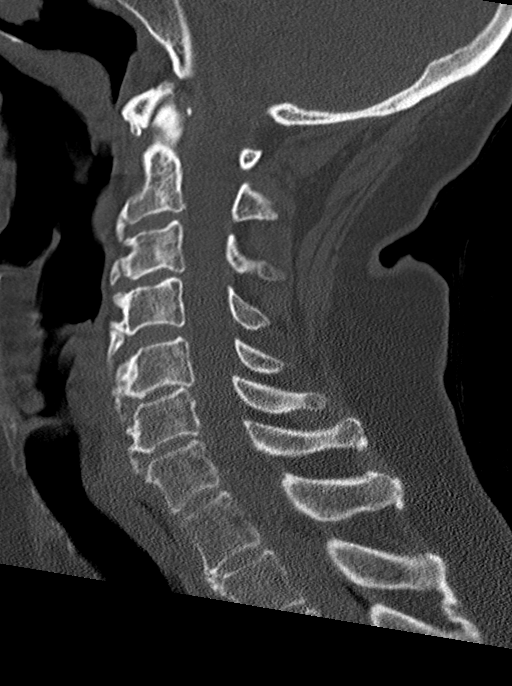
[im 36/61  bone]
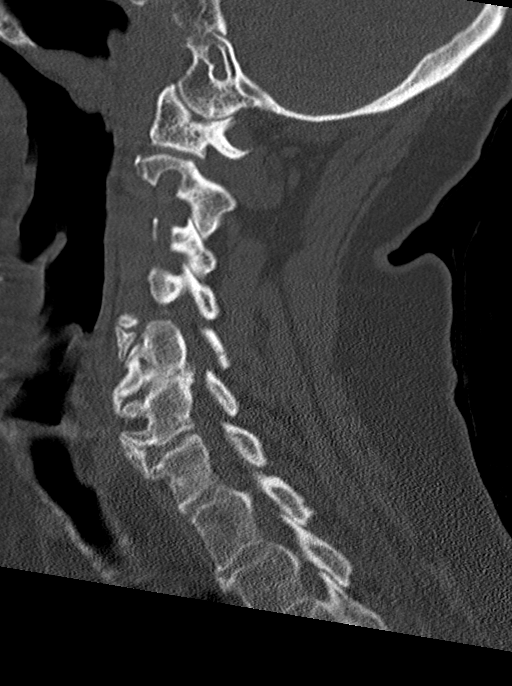
[im 41/61  bone]
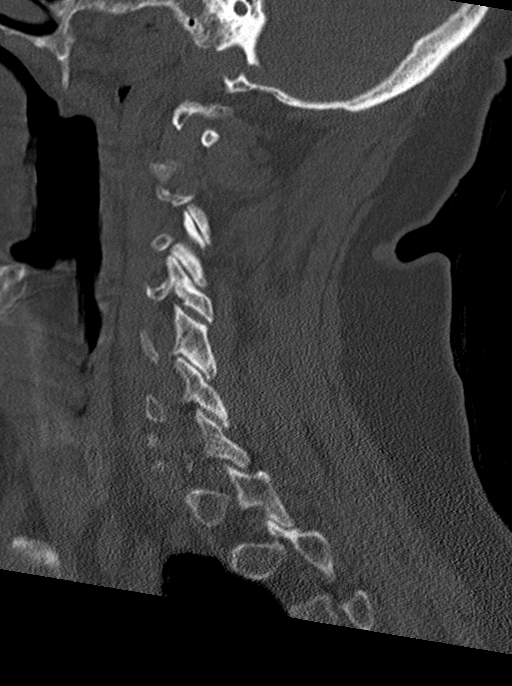

[Series 6: coronal bone · coronal · 0.29mm/px · 3 of 79 slices shown]
[im 16/79  bone]
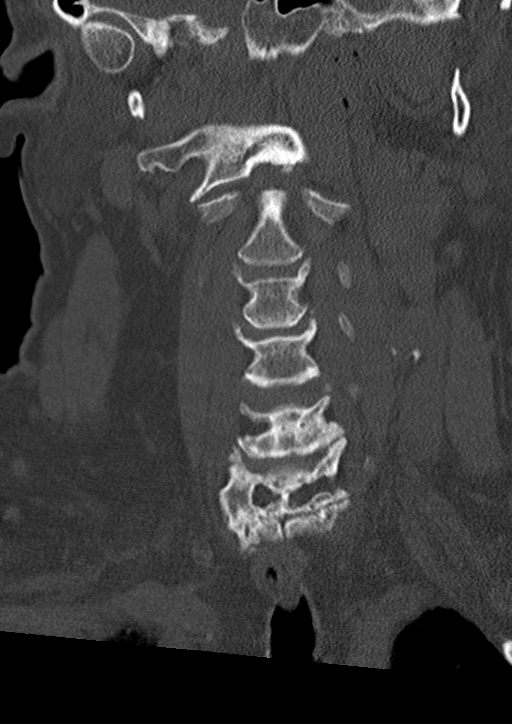
[im 32/79  bone]
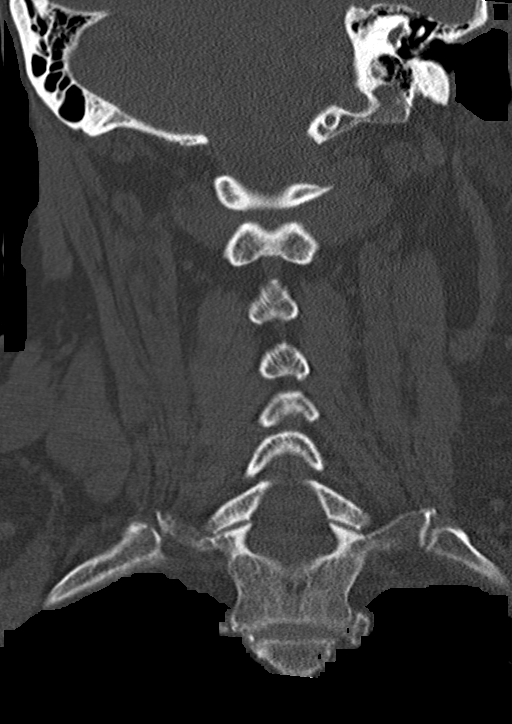
[im 47/79  bone]
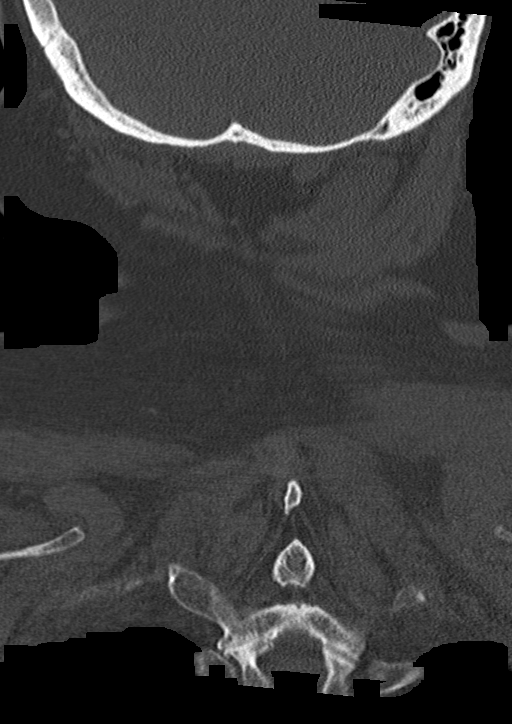

[Series 7: orthogonal axials · axial · 0.21mm/px · z∈[-242,-129]mm · 5 of 92 slices shown, 7 images]
[im 16/92  soft-tissue]
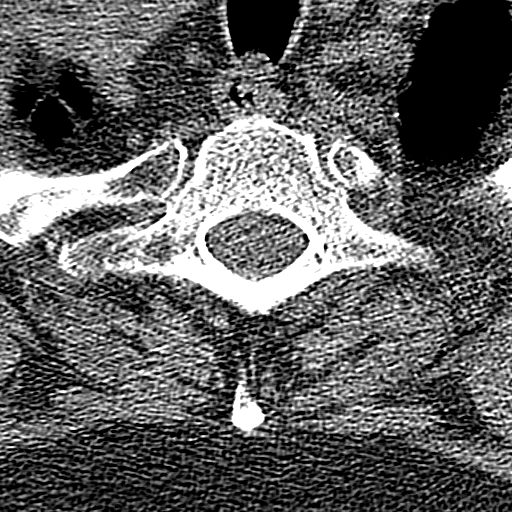
[im 16/92  bone]
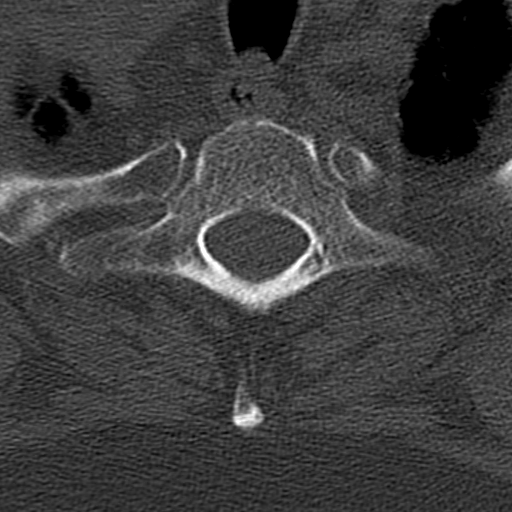
[im 31/92  bone]
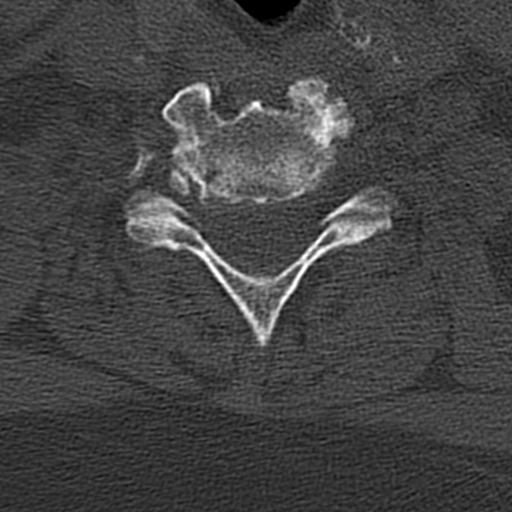
[im 46/92  bone]
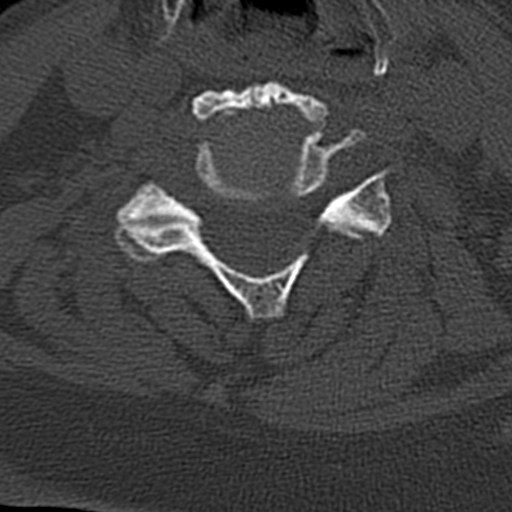
[im 61/92  bone]
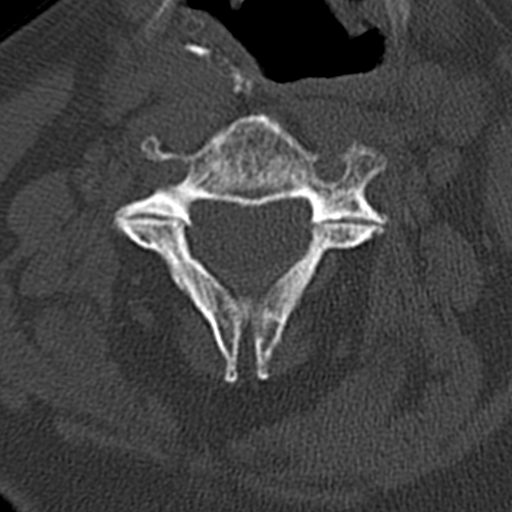
[im 76/92  soft-tissue]
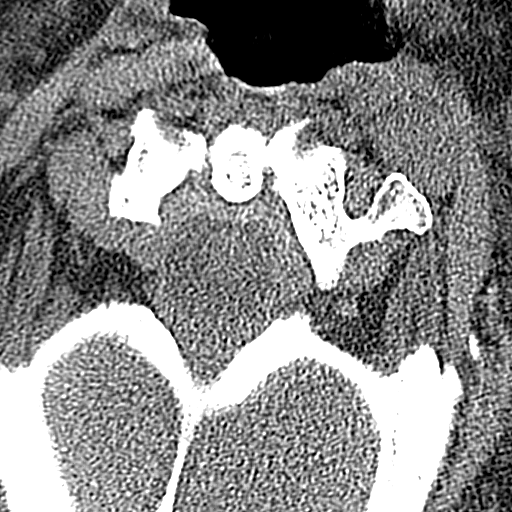
[im 76/92  bone]
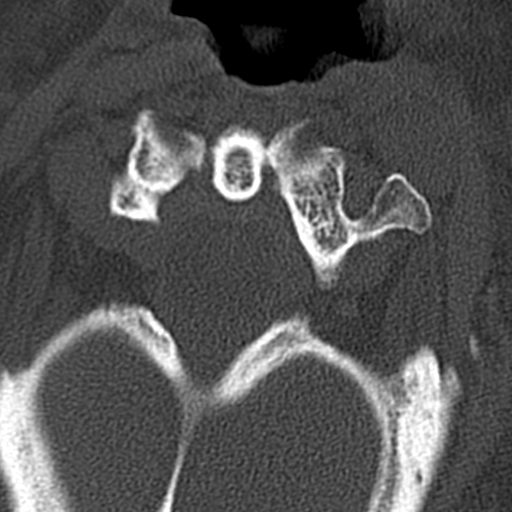

[13 of 35 positions shown; findings below may reference images not displayed]

FINDINGS: CT HEAD FINDINGS

Brain: Mild chronic ischemic white matter disease is noted. No mass
effect or midline shift is noted. Ventricular size is within normal
limits. There is no evidence of mass lesion, hemorrhage or acute
infarction.

Vascular: No hyperdense vessel or unexpected calcification.

Skull: Normal. Negative for fracture or focal lesion.

Sinuses/Orbits: No acute finding.

Other: None.

CT CERVICAL SPINE FINDINGS

Alignment: Minimal grade 1 retrolisthesis of C5-6 is noted secondary
to moderate degenerative disc disease at this level.

Skull base and vertebrae: No acute fracture. No primary bone lesion
or focal pathologic process.

Soft tissues and spinal canal: No prevertebral fluid or swelling. No
visible canal hematoma.

Disc levels: Moderate degenerative disc disease is noted at C5-6 and
C6-7 with anterior osteophyte formation.

Upper chest: Negative.

Other: None.
IMPRESSION: 1. Mild chronic ischemic white matter disease. No acute intracranial
abnormality seen.
2. Multilevel degenerative disc disease. No acute abnormality seen
in the cervical spine.

## 2020-03-25 IMAGING — CT CT HEAD W/O CM
3 series · 15 of 47 positions shown, 18 images · non-contrast
Comparison: None.

CLINICAL DATA: Altered mental status.

EXAM:
CT HEAD WITHOUT CONTRAST
CT CERVICAL SPINE WITHOUT CONTRAST
TECHNIQUE: Multidetector CT imaging of the head and cervical spine was
performed following the standard protocol without intravenous
contrast. Multiplanar CT image reconstructions of the cervical spine
were also generated.

[Series 2: head w o · axial · 0.49mm/px · z∈[-79,+71]mm · 9 of 36 slices shown, 12 images]
[im 3/36  brain]
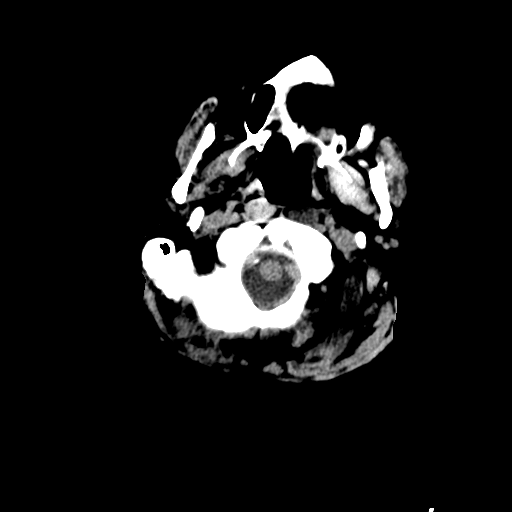
[im 3/36  bone]
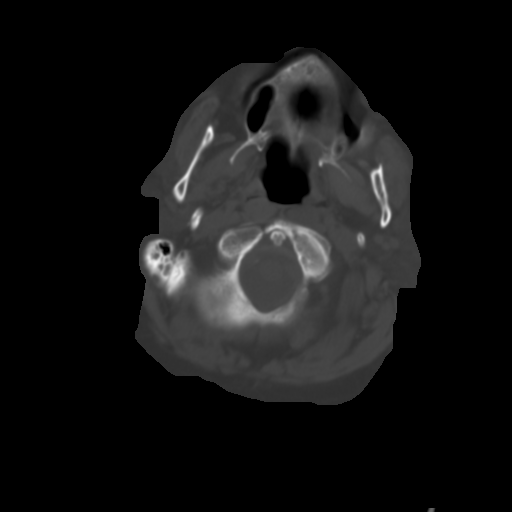
[im 7/36  brain]
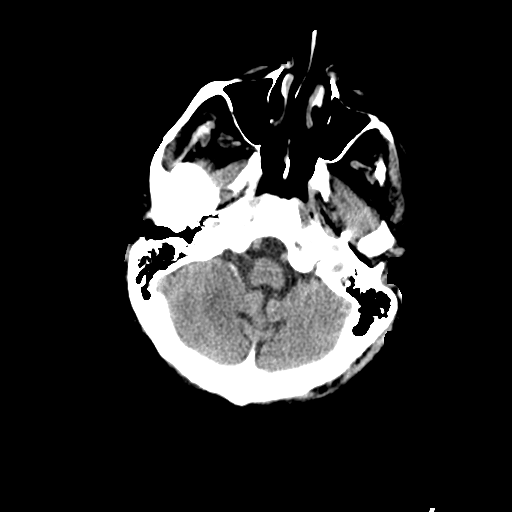
[im 10/36  brain]
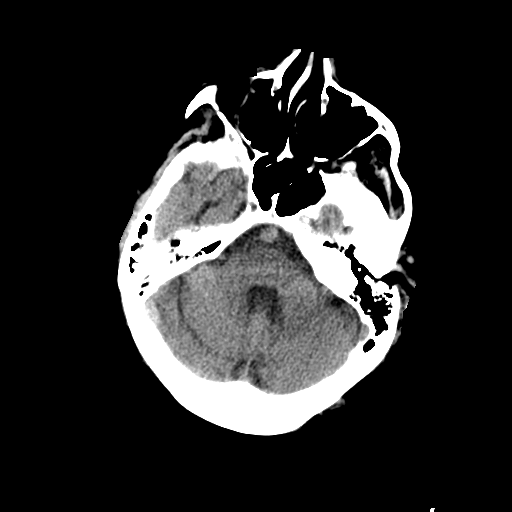
[im 14/36  brain]
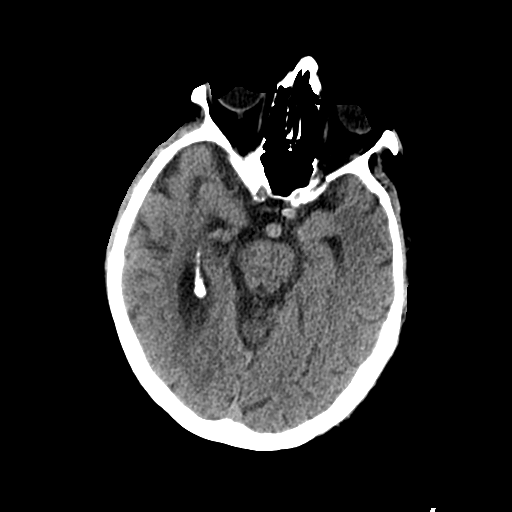
[im 19/36  brain]
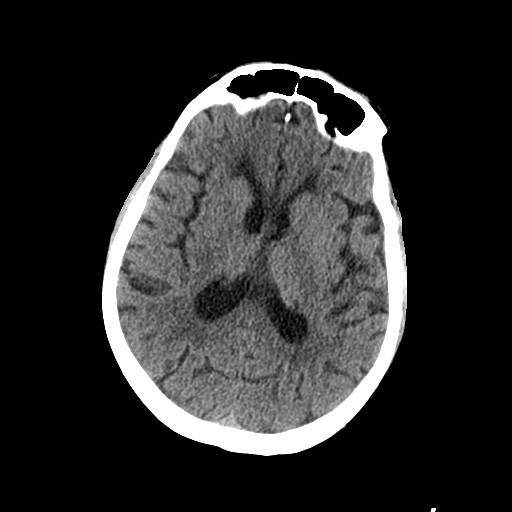
[im 19/36  bone]
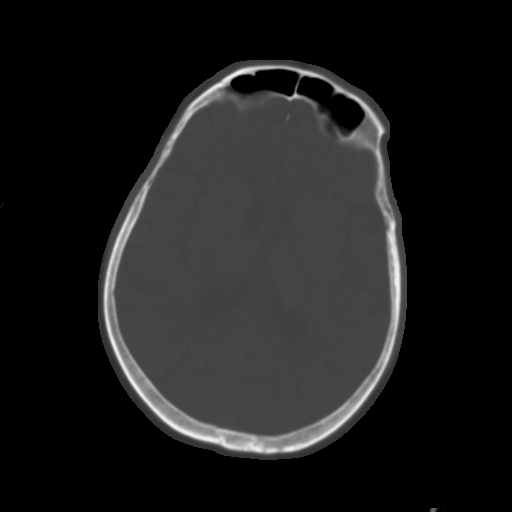
[im 22/36  brain]
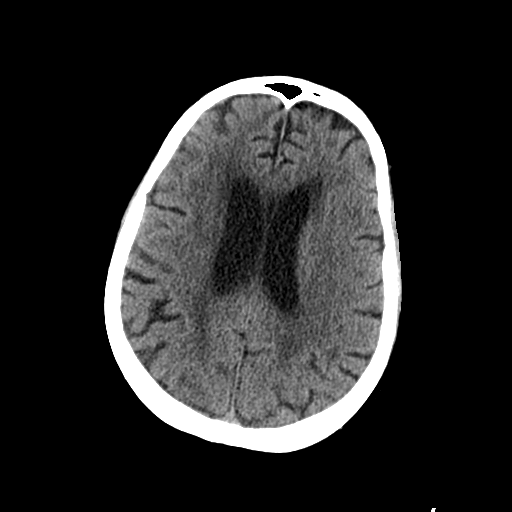
[im 26/36  brain]
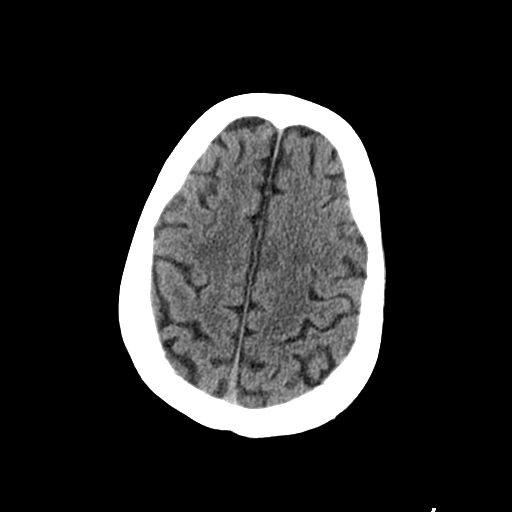
[im 29/36  brain]
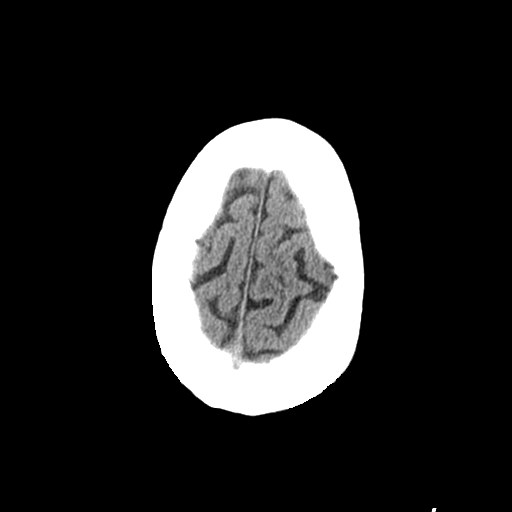
[im 33/36  brain]
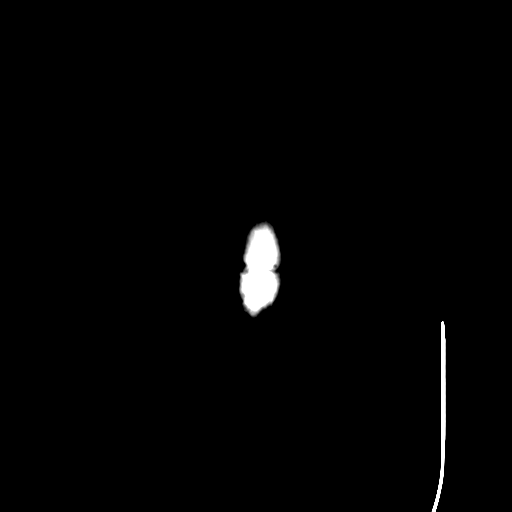
[im 33/36  bone]
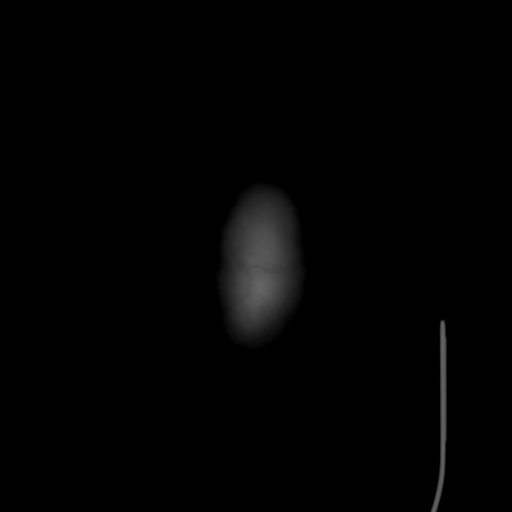

[Series 4: coronal soft · coronal · 0.36mm/px · 3 of 75 slices shown]
[im 25/75  brain]
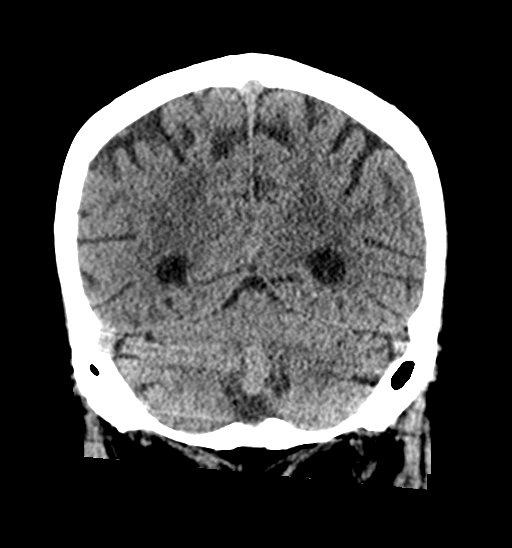
[im 33/75  brain]
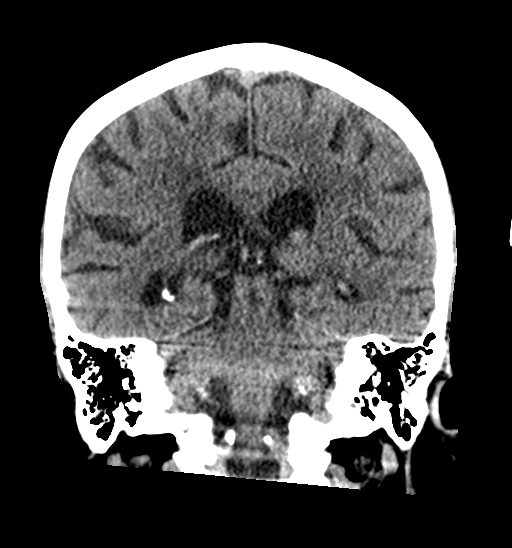
[im 42/75  brain]
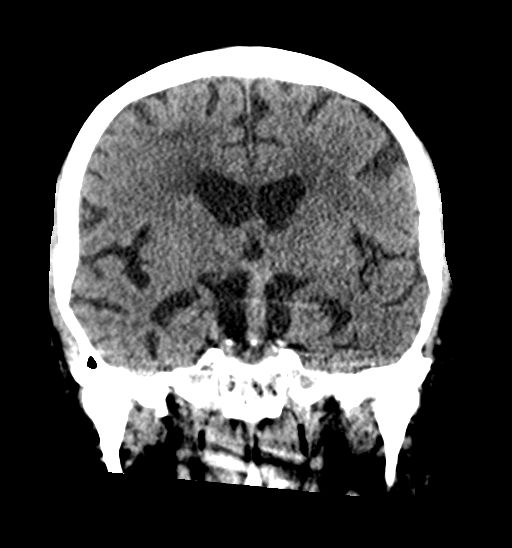

[Series 5: sagittal soft · sagittal · 0.39mm/px · 3 of 56 slices shown]
[im 19/56  brain]
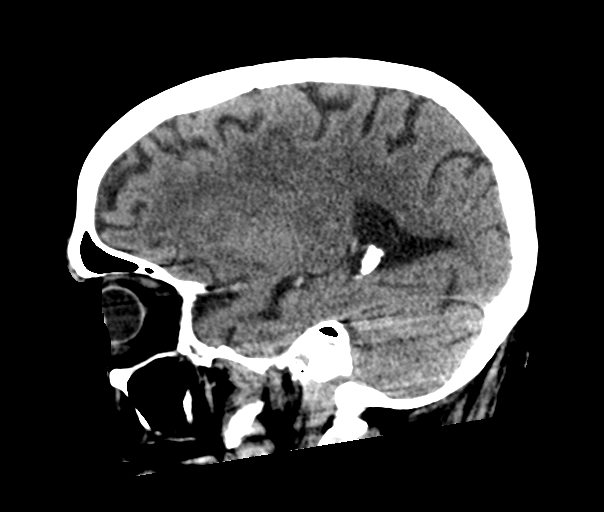
[im 28/56  brain]
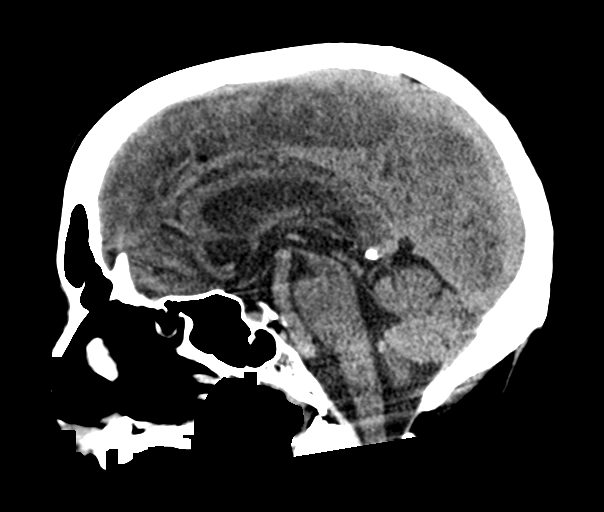
[im 37/56  brain]
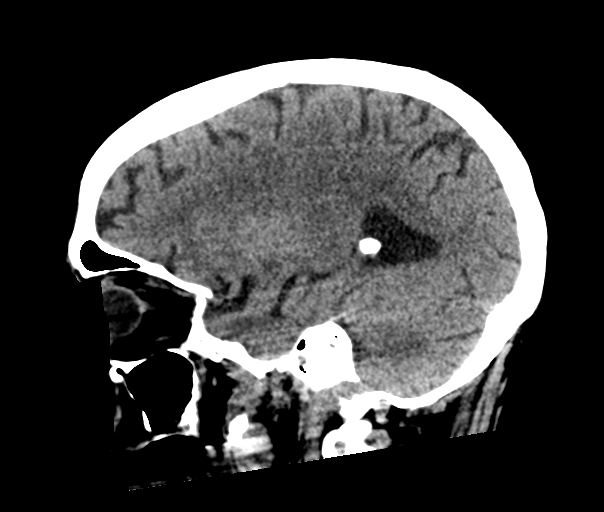

[15 of 47 positions shown; findings below may reference images not displayed]

FINDINGS: CT HEAD FINDINGS

Brain: Mild chronic ischemic white matter disease is noted. No mass
effect or midline shift is noted. Ventricular size is within normal
limits. There is no evidence of mass lesion, hemorrhage or acute
infarction.

Vascular: No hyperdense vessel or unexpected calcification.

Skull: Normal. Negative for fracture or focal lesion.

Sinuses/Orbits: No acute finding.

Other: None.

CT CERVICAL SPINE FINDINGS

Alignment: Minimal grade 1 retrolisthesis of C5-6 is noted secondary
to moderate degenerative disc disease at this level.

Skull base and vertebrae: No acute fracture. No primary bone lesion
or focal pathologic process.

Soft tissues and spinal canal: No prevertebral fluid or swelling. No
visible canal hematoma.

Disc levels: Moderate degenerative disc disease is noted at C5-6 and
C6-7 with anterior osteophyte formation.

Upper chest: Negative.

Other: None.
IMPRESSION: 1. Mild chronic ischemic white matter disease. No acute intracranial
abnormality seen.
2. Multilevel degenerative disc disease. No acute abnormality seen
in the cervical spine.

## 2020-03-25 IMAGING — DX DG CHEST 1V PORT SAME DAY
2 series · 2 of 2 positions shown · non-contrast
Comparison: Portable exam [H2] hours compared to [H2] hours

CLINICAL DATA: Worsening shortness of breath, atrial fibrillation,
COPD, hypertension, smoker

EXAM:
PORTABLE CHEST 1 VIEW

[chest ap (1 of 2)]
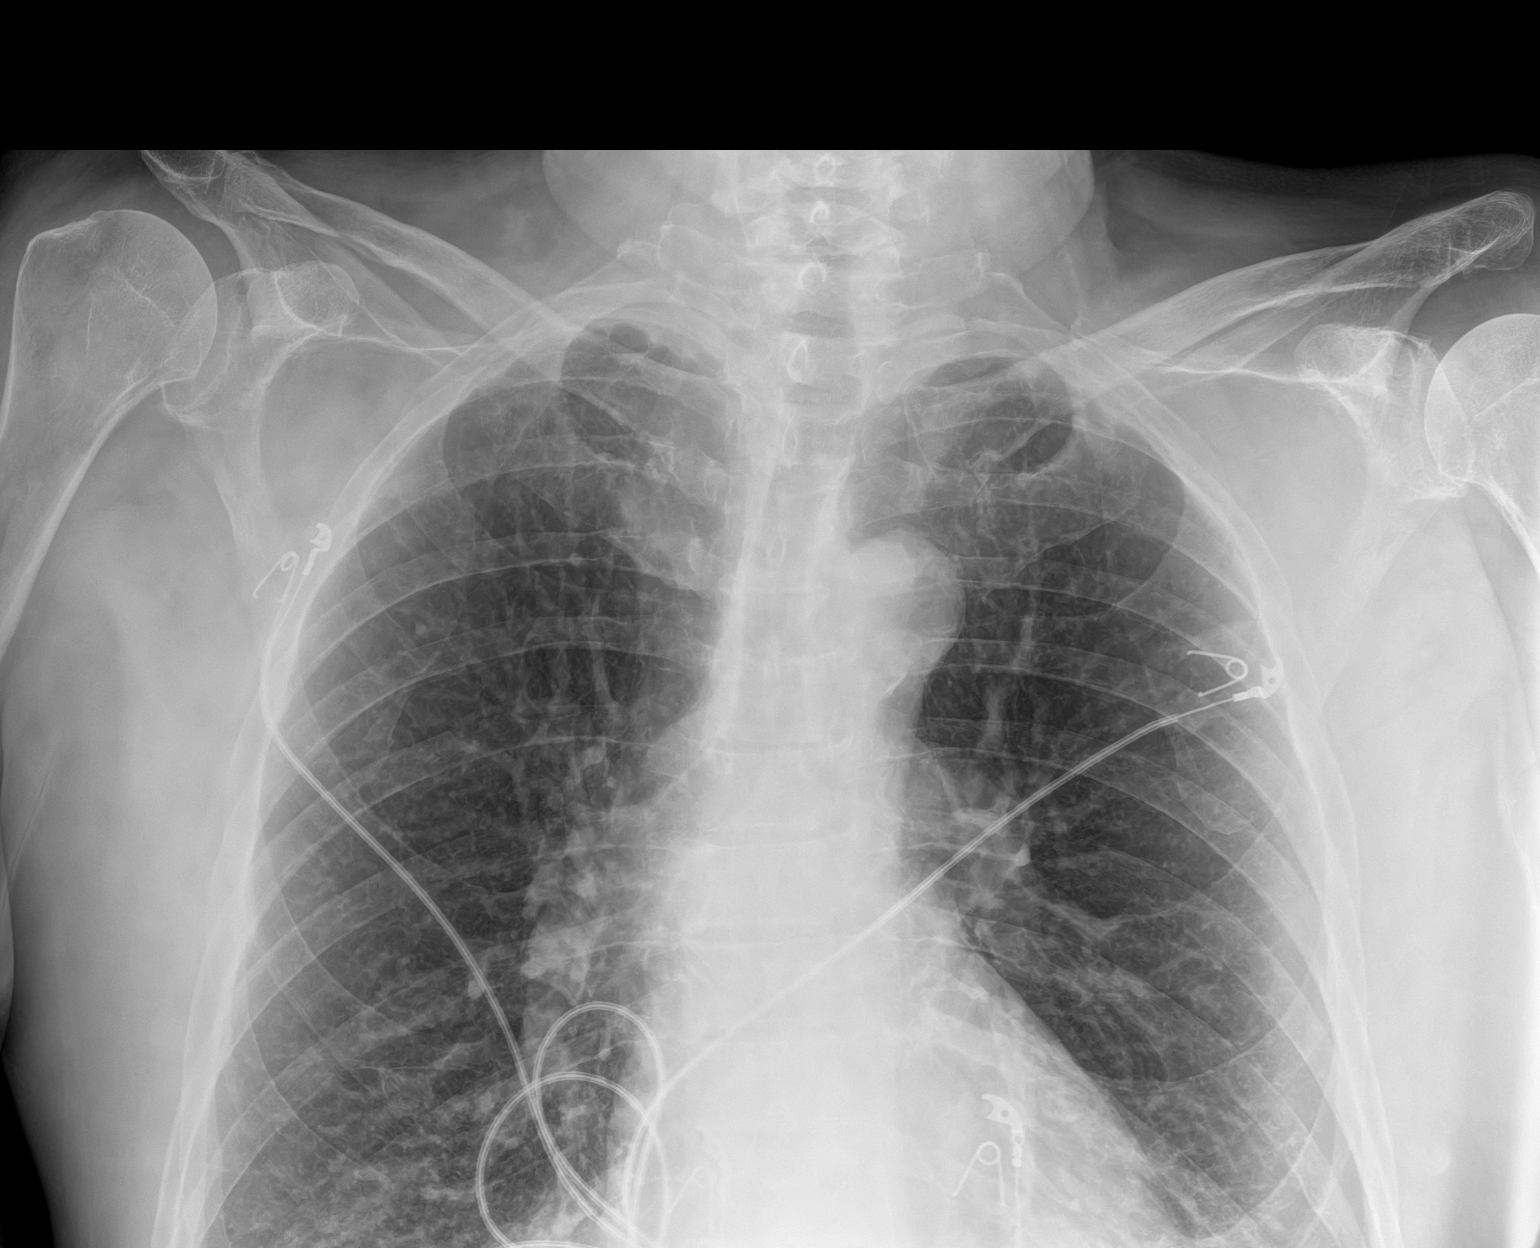

[chest ap (2 of 2)]
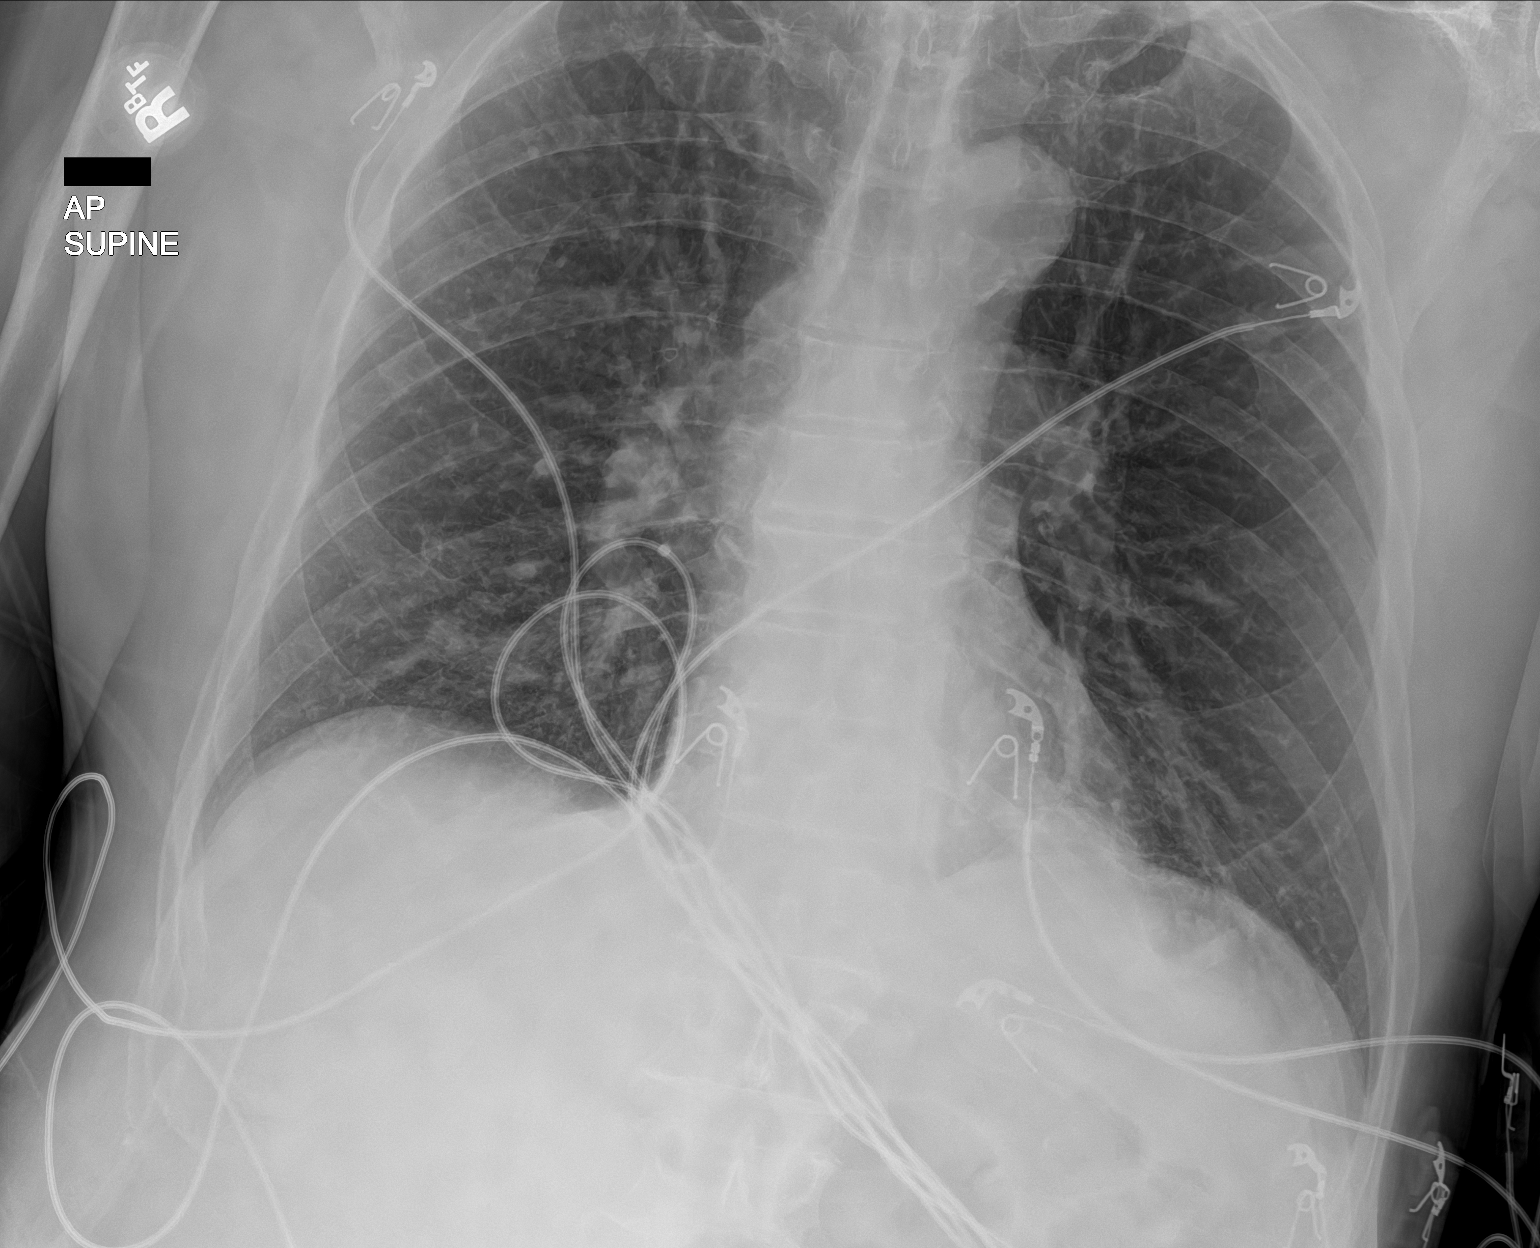

[2 of 2 positions shown; findings below may reference images not displayed]

FINDINGS: Normal heart size, mediastinal contours, and pulmonary vascularity.

Atherosclerotic calcification aorta.

Mild bibasilar atelectasis.

Lungs otherwise clear.

No infiltrate, pleural effusion or pneumothorax.

Osseous structures demineralized.
IMPRESSION: Bibasilar atelectasis.

Aortic Atherosclerosis ([H2]-[H2]).

## 2020-03-25 IMAGING — DX DG CHEST 1V PORT
1 series · 1 of 1 positions shown · non-contrast
Comparison: Chest x-ray [DATE].

CLINICAL DATA: 68-year-old male with history of shortness of
breath. Altered mental status.

EXAM:
PORTABLE CHEST 1 VIEW

[chest ap]
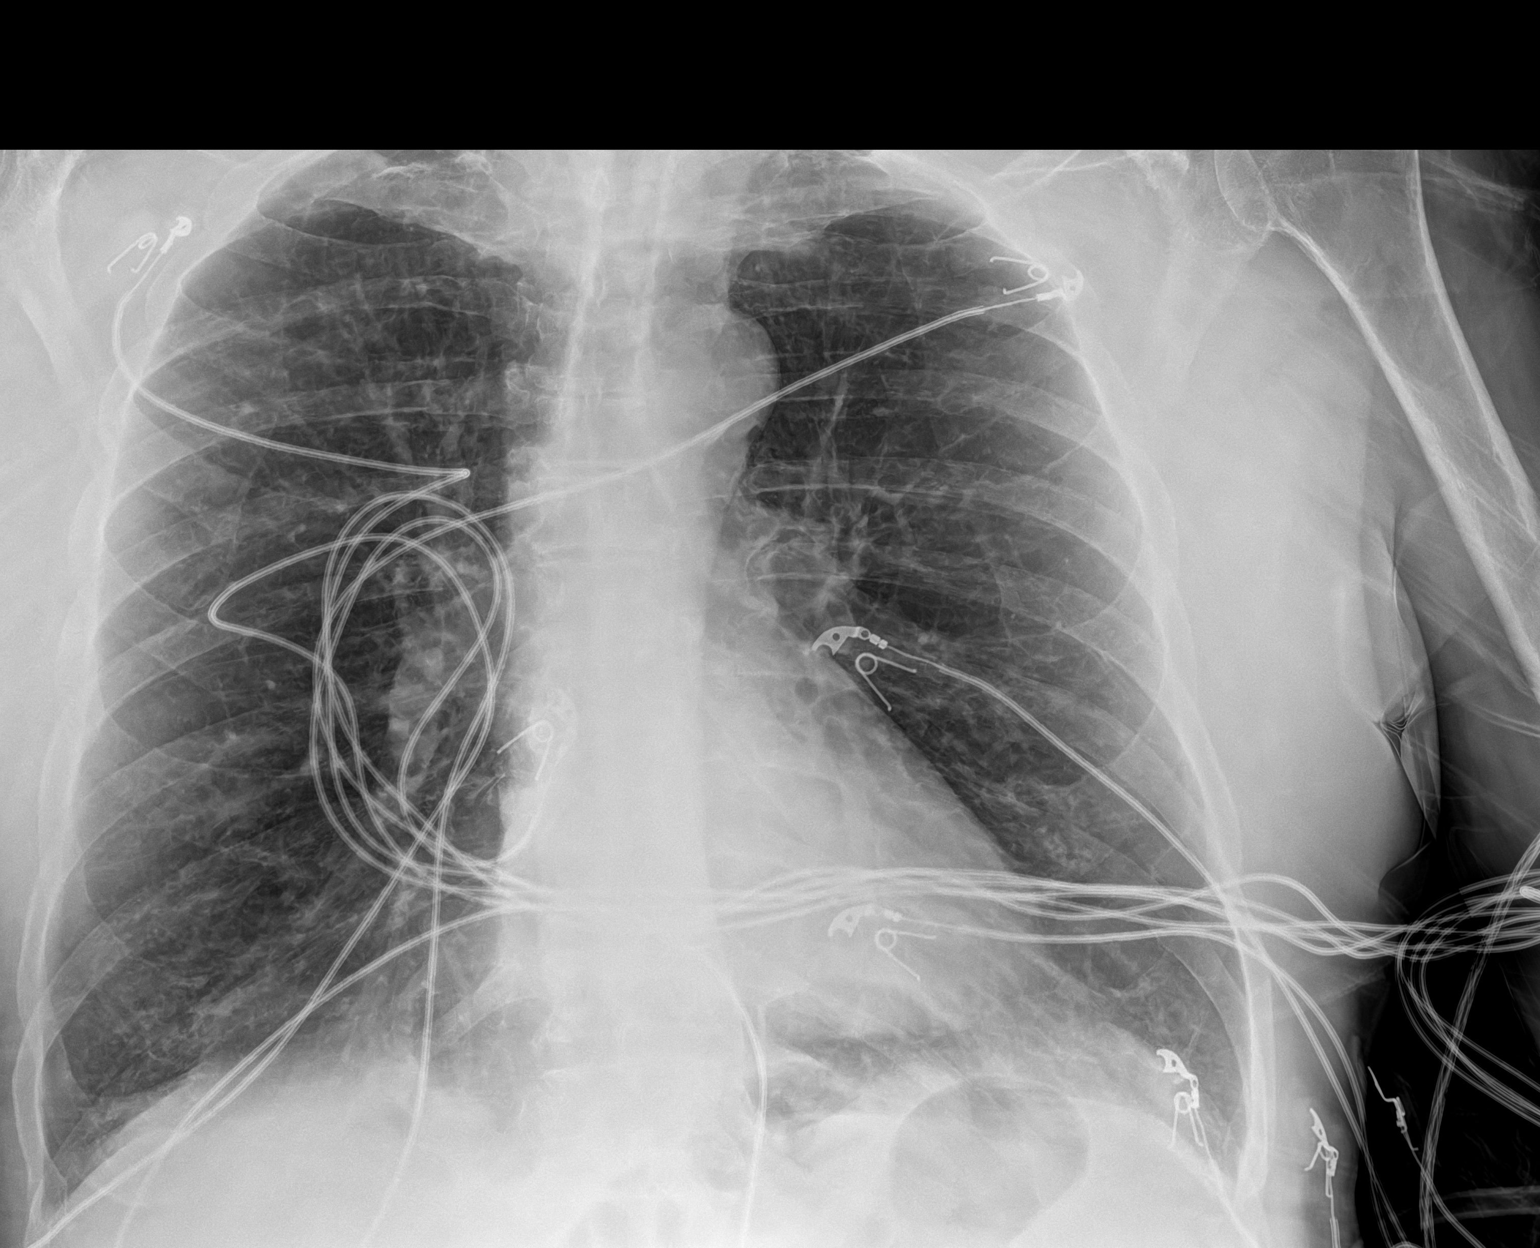

[1 of 1 positions shown; findings below may reference images not displayed]

FINDINGS: Lung volumes are normal. No consolidative airspace disease. No
pleural effusions. No pneumothorax. No pulmonary nodule or mass
noted. Pulmonary vasculature and the cardiomediastinal silhouette
are within normal limits. Atherosclerosis in the thoracic aorta.
IMPRESSION: 1.  No radiographic evidence of acute cardiopulmonary disease.
2. Aortic atherosclerosis.

## 2020-03-25 MED ORDER — FOLIC ACID 1 MG PO TABS
1.0000 mg | ORAL_TABLET | Freq: Every day | ORAL | Status: DC
  Administered 2020-03-26 – 2020-03-30 (×5): 1 mg via ORAL
  Filled 2020-03-25 (×5): qty 1

## 2020-03-25 MED ORDER — METOPROLOL TARTRATE 5 MG/5ML IV SOLN
5.0000 mg | Freq: Four times a day (QID) | INTRAVENOUS | Status: DC | PRN
  Administered 2020-03-28: 5 mg via INTRAVENOUS
  Filled 2020-03-25: qty 5

## 2020-03-25 MED ORDER — SODIUM CHLORIDE 0.9 % IV BOLUS
1000.0000 mL | Freq: Once | INTRAVENOUS | Status: AC
  Administered 2020-03-25: 1000 mL via INTRAVENOUS

## 2020-03-25 MED ORDER — LORAZEPAM 2 MG/ML IJ SOLN
0.0000 mg | Freq: Four times a day (QID) | INTRAMUSCULAR | Status: DC
  Administered 2020-03-25: 2 mg via INTRAVENOUS
  Administered 2020-03-26 (×2): 1 mg via INTRAVENOUS
  Filled 2020-03-25 (×2): qty 1

## 2020-03-25 MED ORDER — CHLORHEXIDINE GLUCONATE CLOTH 2 % EX PADS
6.0000 | MEDICATED_PAD | Freq: Every day | CUTANEOUS | Status: DC
  Administered 2020-03-26 – 2020-03-30 (×5): 6 via TOPICAL

## 2020-03-25 MED ORDER — POLYETHYLENE GLYCOL 3350 17 G PO PACK
17.0000 g | PACK | Freq: Every day | ORAL | Status: DC | PRN
  Administered 2020-03-29: 17 g via ORAL
  Filled 2020-03-25: qty 1

## 2020-03-25 MED ORDER — METOPROLOL TARTRATE 5 MG/5ML IV SOLN
2.5000 mg | Freq: Once | INTRAVENOUS | Status: DC
  Filled 2020-03-25: qty 5

## 2020-03-25 MED ORDER — DEXTROMETHORPHAN POLISTIREX ER 30 MG/5ML PO SUER
30.0000 mg | Freq: Two times a day (BID) | ORAL | Status: DC | PRN
  Administered 2020-03-26 – 2020-03-29 (×3): 30 mg via ORAL
  Filled 2020-03-25 (×4): qty 5

## 2020-03-25 MED ORDER — QUETIAPINE FUMARATE 25 MG PO TABS
50.0000 mg | ORAL_TABLET | Freq: Every day | ORAL | Status: DC
  Administered 2020-03-25 – 2020-03-29 (×6): 50 mg via ORAL
  Filled 2020-03-25 (×5): qty 2

## 2020-03-25 MED ORDER — MOMETASONE FURO-FORMOTEROL FUM 200-5 MCG/ACT IN AERO
2.0000 | INHALATION_SPRAY | Freq: Two times a day (BID) | RESPIRATORY_TRACT | Status: DC
  Administered 2020-03-25 – 2020-03-30 (×10): 2 via RESPIRATORY_TRACT
  Filled 2020-03-25: qty 8.8

## 2020-03-25 MED ORDER — ENOXAPARIN SODIUM 30 MG/0.3ML ~~LOC~~ SOLN: 30.0000 mg | SUBCUTANEOUS | Status: DC

## 2020-03-25 MED ORDER — IPRATROPIUM-ALBUTEROL 0.5-2.5 (3) MG/3ML IN SOLN
3.0000 mL | RESPIRATORY_TRACT | Status: DC | PRN
  Administered 2020-03-26: 3 mL via RESPIRATORY_TRACT
  Filled 2020-03-25: qty 3

## 2020-03-25 MED ORDER — APIXABAN 5 MG PO TABS: 5.0000 mg | ORAL_TABLET | Freq: Two times a day (BID) | ORAL | Status: DC

## 2020-03-25 MED ORDER — SODIUM CHLORIDE 0.9 % IV SOLN
100.0000 mg | Freq: Two times a day (BID) | INTRAVENOUS | Status: DC
  Administered 2020-03-26 – 2020-03-27 (×3): 100 mg via INTRAVENOUS
  Filled 2020-03-25 (×6): qty 100

## 2020-03-25 MED ORDER — METOPROLOL SUCCINATE ER 50 MG PO TB24
100.0000 mg | ORAL_TABLET | Freq: Every day | ORAL | Status: DC
  Administered 2020-03-26 – 2020-03-27 (×2): 100 mg via ORAL
  Filled 2020-03-25: qty 4
  Filled 2020-03-25: qty 2
  Filled 2020-03-25: qty 4

## 2020-03-25 MED ORDER — LORAZEPAM 2 MG/ML IJ SOLN: 0.0000 mg | Freq: Two times a day (BID) | INTRAMUSCULAR | Status: DC

## 2020-03-25 MED ORDER — MUPIROCIN 2 % EX OINT
1.0000 "application " | TOPICAL_OINTMENT | Freq: Two times a day (BID) | CUTANEOUS | Status: AC
  Administered 2020-03-25 – 2020-03-30 (×9): 1 via NASAL
  Filled 2020-03-25 (×3): qty 22

## 2020-03-25 MED ORDER — THIAMINE HCL 100 MG/ML IJ SOLN
100.0000 mg | Freq: Every day | INTRAMUSCULAR | Status: DC
  Filled 2020-03-25 (×2): qty 2

## 2020-03-25 MED ORDER — METOPROLOL TARTRATE 5 MG/5ML IV SOLN
2.5000 mg | Freq: Four times a day (QID) | INTRAVENOUS | Status: AC
  Administered 2020-03-25 – 2020-03-26 (×2): 2.5 mg via INTRAVENOUS
  Filled 2020-03-25 (×2): qty 5

## 2020-03-25 MED ORDER — PANTOPRAZOLE SODIUM 40 MG PO TBEC
40.0000 mg | DELAYED_RELEASE_TABLET | Freq: Every day | ORAL | Status: DC
  Administered 2020-03-26 – 2020-03-30 (×5): 40 mg via ORAL
  Filled 2020-03-25 (×5): qty 1

## 2020-03-25 MED ORDER — BUPROPION HCL ER (XL) 300 MG PO TB24
300.0000 mg | ORAL_TABLET | Freq: Every day | ORAL | Status: DC
  Administered 2020-03-26 – 2020-03-30 (×5): 300 mg via ORAL
  Filled 2020-03-25 (×8): qty 1

## 2020-03-25 MED ORDER — THIAMINE HCL 100 MG PO TABS
100.0000 mg | ORAL_TABLET | Freq: Every day | ORAL | Status: DC
  Administered 2020-03-26 – 2020-03-30 (×5): 100 mg via ORAL
  Filled 2020-03-25 (×5): qty 1

## 2020-03-25 MED ORDER — ACETAMINOPHEN 650 MG RE SUPP: 650.0000 mg | Freq: Four times a day (QID) | RECTAL | Status: DC | PRN

## 2020-03-25 MED ORDER — LORAZEPAM 2 MG/ML IJ SOLN
1.0000 mg | INTRAMUSCULAR | Status: AC | PRN
  Administered 2020-03-26 – 2020-03-27 (×2): 2 mg via INTRAVENOUS
  Administered 2020-03-28: 1 mg via INTRAVENOUS
  Filled 2020-03-25 (×4): qty 1

## 2020-03-25 MED ORDER — APIXABAN 2.5 MG PO TABS
2.5000 mg | ORAL_TABLET | Freq: Two times a day (BID) | ORAL | Status: DC
  Administered 2020-03-25 – 2020-03-26 (×2): 2.5 mg via ORAL
  Filled 2020-03-25 (×2): qty 1

## 2020-03-25 MED ORDER — ADULT MULTIVITAMIN W/MINERALS CH
1.0000 | ORAL_TABLET | Freq: Every day | ORAL | Status: DC
  Administered 2020-03-26 – 2020-03-30 (×5): 1 via ORAL
  Filled 2020-03-25 (×5): qty 1

## 2020-03-25 MED ORDER — LORAZEPAM 1 MG PO TABS
1.0000 mg | ORAL_TABLET | ORAL | Status: AC | PRN
  Administered 2020-03-28: 1 mg via ORAL
  Filled 2020-03-25: qty 1

## 2020-03-25 MED ORDER — FENTANYL CITRATE (PF) 100 MCG/2ML IJ SOLN
12.5000 ug | INTRAMUSCULAR | Status: DC | PRN
  Administered 2020-03-25 – 2020-03-29 (×3): 12.5 ug via INTRAVENOUS
  Filled 2020-03-25 (×3): qty 2

## 2020-03-25 MED ORDER — ONDANSETRON HCL 4 MG/2ML IJ SOLN
4.0000 mg | Freq: Four times a day (QID) | INTRAMUSCULAR | Status: DC | PRN
  Administered 2020-03-29: 4 mg via INTRAVENOUS
  Filled 2020-03-25: qty 2

## 2020-03-25 MED ORDER — DEXTROSE-NACL 5-0.9 % IV SOLN: INTRAVENOUS | Status: DC

## 2020-03-25 MED ORDER — NICOTINE 21 MG/24HR TD PT24
21.0000 mg | MEDICATED_PATCH | Freq: Every day | TRANSDERMAL | Status: DC
  Administered 2020-03-26 – 2020-03-30 (×5): 21 mg via TRANSDERMAL
  Filled 2020-03-25 (×5): qty 1

## 2020-03-25 MED ORDER — SODIUM CHLORIDE 0.9 % IV BOLUS: 1000.0000 mL | Freq: Once | INTRAVENOUS | Status: DC

## 2020-03-25 MED ORDER — TIOTROPIUM BROMIDE MONOHYDRATE 18 MCG IN CAPS
1.0000 | ORAL_CAPSULE | Freq: Every day | RESPIRATORY_TRACT | Status: DC
  Filled 2020-03-25: qty 5

## 2020-03-25 MED ORDER — ONDANSETRON HCL 4 MG PO TABS: 4.0000 mg | ORAL_TABLET | Freq: Four times a day (QID) | ORAL | Status: DC | PRN

## 2020-03-25 MED ORDER — ACETAMINOPHEN 325 MG PO TABS
650.0000 mg | ORAL_TABLET | Freq: Four times a day (QID) | ORAL | Status: DC | PRN
  Administered 2020-03-26: 650 mg via ORAL

## 2020-03-25 NOTE — ED Notes (Signed)
Report given to Brandy RN
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
## 2020-03-25 NOTE — ED Triage Notes (Signed)
Patient presented to the ED after being found lying naked on the floor by [employer].  Patient is altered and unable to walk  Patient oriented to self.
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
## 2020-03-25 NOTE — ED Provider Notes (Signed)
  Face-to-face evaluation   History: He arrives, from home after he called EMS because he is not feeling well.  He was found to be moderately confused.  He is unable to give specific history.  Physical exam: Alert and conversant, mildly confused.  Oriented to person and place.  Moves all extremities equally.  No respiratory distress.  Medical screening examination/treatment/procedure(s) were conducted as a shared visit with non-physician practitioner(s) and myself.  I personally evaluated the patient during the encounter    Daleen Bo, MD 03/25/20 1827
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
## 2020-03-26 ENCOUNTER — Inpatient Hospital Stay (HOSPITAL_COMMUNITY): Payer: Medicare HMO

## 2020-03-26 DIAGNOSIS — F411 Generalized anxiety disorder: Secondary | ICD-10-CM

## 2020-03-26 LAB — BASIC METABOLIC PANEL
Anion gap: 12 (ref 5–15)
BUN: 43 mg/dL — ABNORMAL HIGH (ref 8–23)
CO2: 20 mmol/L — ABNORMAL LOW (ref 22–32)
Calcium: 7.6 mg/dL — ABNORMAL LOW (ref 8.9–10.3)
Chloride: 100 mmol/L (ref 98–111)
Creatinine, Ser: 1.18 mg/dL (ref 0.61–1.24)
GFR calc Af Amer: >60 mL/min (ref >60)
GFR calc non Af Amer: >60 mL/min (ref >60)
Glucose, Bld: 158 mg/dL — ABNORMAL HIGH (ref 70–99)
Potassium: 3.2 mmol/L — ABNORMAL LOW (ref 3.5–5.1)
Sodium: 132 mmol/L — ABNORMAL LOW (ref 135–145)

## 2020-03-26 LAB — CK: Total CK: 526 U/L — ABNORMAL HIGH (ref 49–397)

## 2020-03-26 LAB — URINE CULTURE: Culture: NO GROWTH

## 2020-03-26 LAB — CBC WITH DIFFERENTIAL/PLATELET
Abs Immature Granulocytes: 0.18 10*3/uL — ABNORMAL HIGH (ref 0.00–0.07)
Basophils Absolute: 0 10*3/uL (ref 0.0–0.1)
Basophils Relative: 0 %
Eosinophils Absolute: 0 10*3/uL (ref 0.0–0.5)
Eosinophils Relative: 0 %
HCT: 30.7 % — ABNORMAL LOW (ref 39.0–52.0)
Hemoglobin: 10.6 g/dL — ABNORMAL LOW (ref 13.0–17.0)
Immature Granulocytes: 3 %
Lymphocytes Relative: 7 %
Lymphs Abs: 0.5 10*3/uL — ABNORMAL LOW (ref 0.7–4.0)
MCH: 32.7 pg (ref 26.0–34.0)
MCHC: 34.5 g/dL (ref 30.0–36.0)
MCV: 94.8 fL (ref 80.0–100.0)
Monocytes Absolute: 0.7 10*3/uL (ref 0.1–1.0)
Monocytes Relative: 10 %
Neutro Abs: 5.9 10*3/uL (ref 1.7–7.7)
Neutrophils Relative %: 80 %
Platelets: 186 10*3/uL (ref 150–400)
RBC: 3.24 MIL/uL — ABNORMAL LOW (ref 4.22–5.81)
RDW: 13.2 % (ref 11.5–15.5)
WBC: 7.3 10*3/uL (ref 4.0–10.5)
nRBC: 0 % (ref 0.0–0.2)

## 2020-03-26 LAB — PROTIME-INR
INR: 1.1 (ref 0.8–1.2)
Prothrombin Time: 14.2 seconds (ref 11.4–15.2)

## 2020-03-26 LAB — HEMOGLOBIN A1C
Hgb A1c MFr Bld: 4.9 % (ref 4.8–5.6)
Mean Plasma Glucose: 93.93 mg/dL

## 2020-03-26 LAB — MAGNESIUM: Magnesium: 1.8 mg/dL (ref 1.7–2.4)

## 2020-03-26 LAB — VITAMIN D 25 HYDROXY (VIT D DEFICIENCY, FRACTURES): Vit D, 25-Hydroxy: 15.9 ng/mL — ABNORMAL LOW (ref 30–100)

## 2020-03-26 LAB — BRAIN NATRIURETIC PEPTIDE: B Natriuretic Peptide: 614 pg/mL — ABNORMAL HIGH (ref 0.0–100.0)

## 2020-03-26 IMAGING — DX DG CHEST 1V PORT
1 series · 1 of 1 positions shown · non-contrast
Comparison: None.

CLINICAL DATA: Acute renal failure

EXAM:
PORTABLE CHEST 1 VIEW

[chest ap]
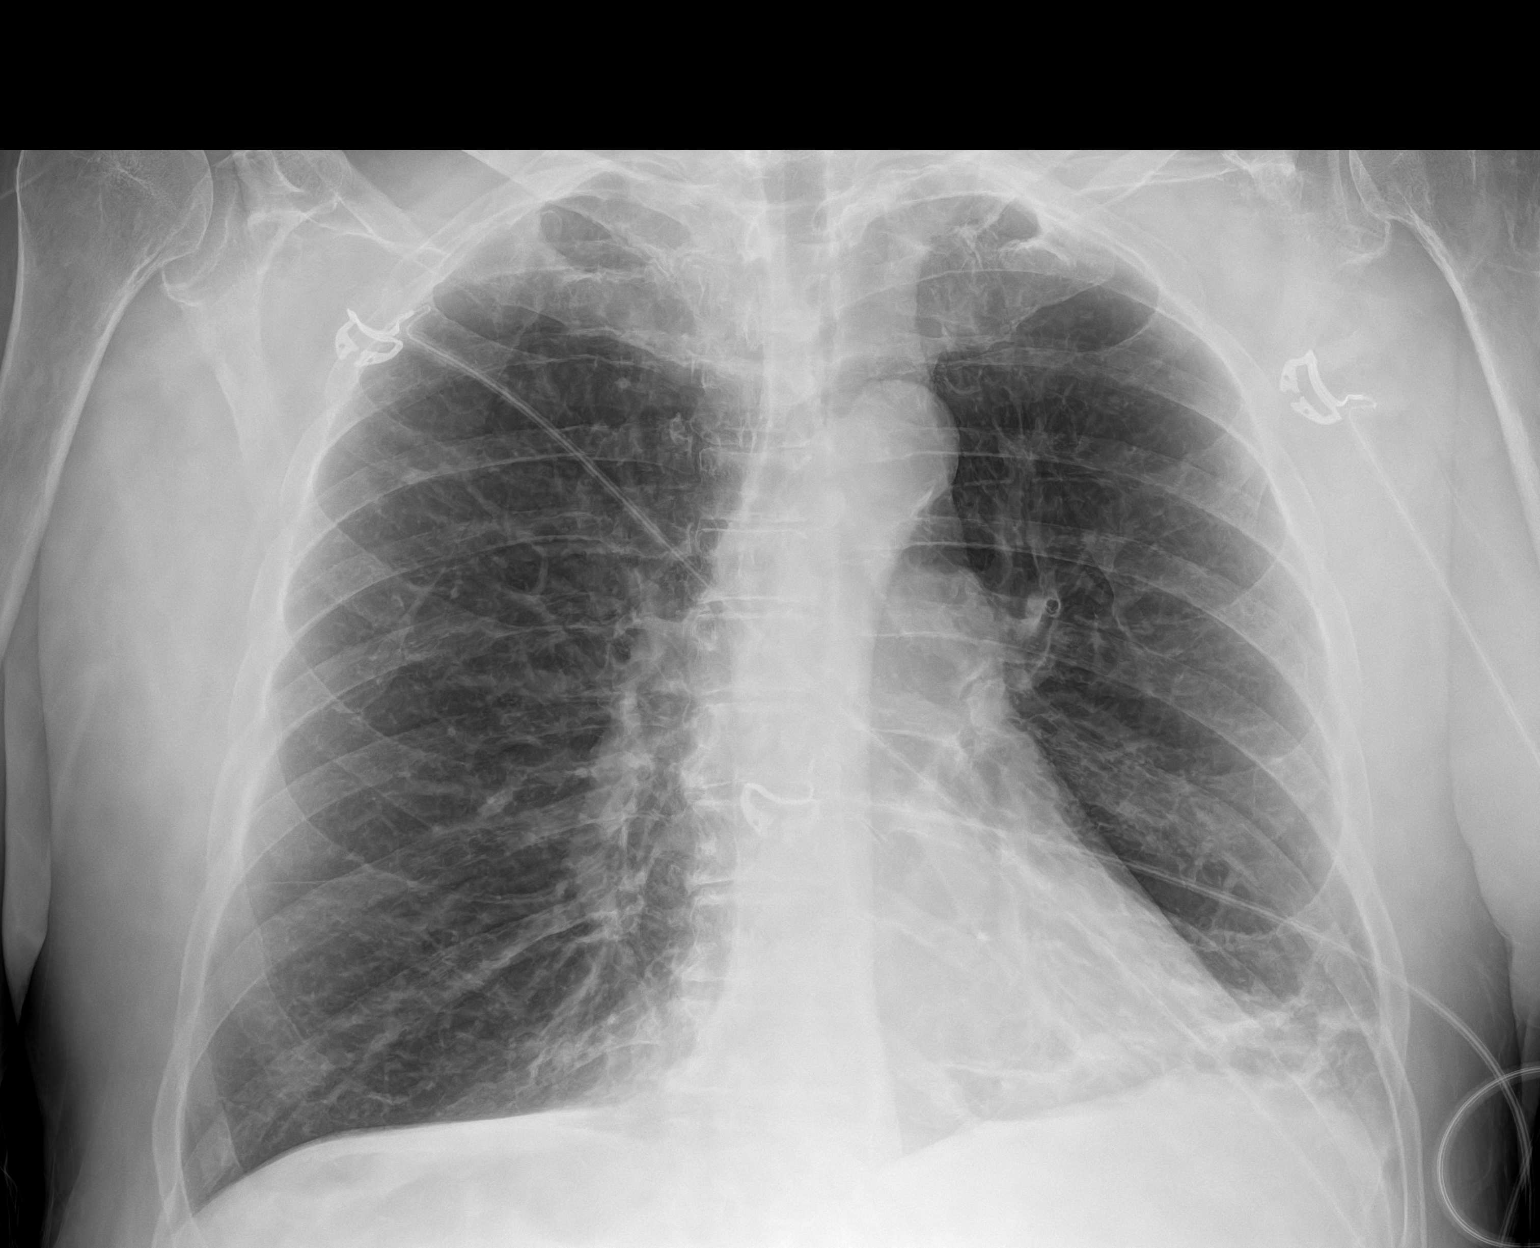

[1 of 1 positions shown; findings below may reference images not displayed]

FINDINGS: The heart size and mediastinal contours are within normal limits.
There is left basilar atelectasis. Lungs are otherwise clear. The
visualized skeletal structures are unremarkable.
IMPRESSION: Left basilar atelectasis.

## 2020-03-26 MED ORDER — APIXABAN 5 MG PO TABS
5.0000 mg | ORAL_TABLET | Freq: Two times a day (BID) | ORAL | Status: DC
  Administered 2020-03-26 – 2020-03-30 (×9): 5 mg via ORAL
  Filled 2020-03-26 (×8): qty 1

## 2020-03-26 MED ORDER — ENSURE ENLIVE PO LIQD
237.0000 mL | Freq: Two times a day (BID) | ORAL | Status: DC
  Administered 2020-03-26 – 2020-03-30 (×6): 237 mL via ORAL

## 2020-03-26 MED ORDER — SODIUM CHLORIDE 0.9 % IV SOLN: INTRAVENOUS | Status: DC

## 2020-03-26 MED ORDER — UMECLIDINIUM BROMIDE 62.5 MCG/INH IN AEPB
1.0000 | INHALATION_SPRAY | Freq: Every day | RESPIRATORY_TRACT | Status: DC
  Administered 2020-03-26 – 2020-03-30 (×5): 1 via RESPIRATORY_TRACT
  Filled 2020-03-26: qty 7

## 2020-03-26 MED ORDER — POTASSIUM CHLORIDE CRYS ER 20 MEQ PO TBCR
60.0000 meq | EXTENDED_RELEASE_TABLET | Freq: Once | ORAL | Status: AC
  Administered 2020-03-26: 60 meq via ORAL
  Filled 2020-03-26: qty 3

## 2020-03-26 MED ORDER — MAGNESIUM SULFATE 2 GM/50ML IV SOLN
2.0000 g | Freq: Once | INTRAVENOUS | Status: AC
  Administered 2020-03-26: 2 g via INTRAVENOUS
  Filled 2020-03-26: qty 50

## 2020-03-26 NOTE — Progress Notes (Signed)
Initial Nutrition Assessment  DOCUMENTATION CODES:   Not applicable  INTERVENTION:  Ensure Enlive po BID, each supplement provides 350 kcal and 20 grams of protein  Monitor magnesium, potassium, and phosphorus daily for at least 3 days, MD to replete as needed, as pt is at risk for refeeding syndrome given history of daily alcohol use.    NUTRITION DIAGNOSIS:   Increased nutrient needs related to chronic illness (COPD) as evidenced by estimated needs.    GOAL:   Patient will meet greater than or equal to 90% of their needs   MONITOR:   Labs, Supplement acceptance, PO intake, Weight trends, I & O's  REASON FOR ASSESSMENT:   Consult Assessment of nutrition requirement/status  ASSESSMENT:  RD working remotely.  69 year old male with history of alcohol abuse, chronic atrial fibrillation, COPD, HTN, depression, generalized anxiety, hearing loss, osteoarthritis, elevated PSA with repeated benign biopsy x 2, admitted for acute renal failure after calling EMS while having hallucinations at home and found by police on bathroom floor hypothermic, naked, confused with AMS and somnolence.  Patient in ICU, unable to contact via phone at this time. RD called ICU nurses station, however no answer this afternoon. Patient noted to appear somewhat confused this morning, wanting to eat breakfast. He is on a soft diet, no documented intakes at this time for review. Will provide Ensure supplement to aid with meeting needs. Patient with history of significant alcohol abuse, reportedly drinks up to 30 beers/day. Given this, suspect suboptimal nutrition intake, he is at high risk for refeeding, will need to monitor lytes closely.  Per chart, weights have been fairly stable over the past 18 months.   Per notes: -temp improved, bair hugger discontinued -mild rhabdomyolysis, CK trending down with IVF -hyponatremia improving -AKI improving Medications reviewed and include: Folic acid, Ativan, MVI,  Protonix, Seroquel, Thiamine IVF: NaCl IVPB: Doxycycline Labs: Na 132 (L), K 3.2 (L), BUN 43 (H), BNP 614 (H), Mg 1.8 (WNL)  NUTRITION - FOCUSED PHYSICAL EXAM: Unable to complete at this time, RD working remotely.  Diet Order:   Diet Order            DIET SOFT Room service appropriate? Yes; Fluid consistency: Thin  Diet effective now                 EDUCATION NEEDS:   Not appropriate for education at this time  Skin:  Skin Assessment: Reviewed RN Assessment  Last BM:  unknown  Height:   Ht Readings from Last 1 Encounters:  03/25/20 5\' 9"  (1.753 m)    Weight:   Wt Readings from Last 1 Encounters:  03/26/20 86.8 kg    BMI:  Body mass index is 28.26 kg/m.  Estimated Nutritional Needs:   Kcal:  9935-7017  Protein:  100-120  Fluid:  >/= 2.2 L/day   Lajuan Lines, RD, LDN Clinical Nutrition After Hours/Weekend Pager # in [place]

## 2020-03-26 NOTE — Progress Notes (Addendum)
PROGRESS NOTE   Jacob Frank  GNF:[id number] DOB: May 17, 1951 DOA: 03/25/2020 PCP: [name], MD   Chief Complaint  Patient presents with   Altered Mental Status    Brief Admission History:  69 y.o. male alcohol abuser, smoker with chronic atrial fibrillation, COPD, hypertension, depression and generalized anxiety, hearing loss, osteoarthritis and elevated PSA with repeated benign biopsy x2, apparently called EMS while having hallucinations at home he apparently was found by police down on the bathroom floor naked confused with altered mentation and somnolence.  He was hypothermic.  He reports that he had been drinking alcohol most of the evening.  When patient arrived he was oriented to self only and markedly confused and somnolent.  He was unable to report what happened to him clearly but reported that he was trying to take a shower just prior to falling and could not get up off the floor.  Assessment & Plan:   Principal Problem:   Acute renal failure (ARF) (HCC) Active Problems:   COPD (chronic obstructive pulmonary disease) (HCC)   HTN (hypertension), benign   Tobacco dependence   GAD (generalized anxiety disorder)   Elevated PSA   Hyponatremia   Alcohol abuse   Rhabdomyolysis   Leukocytosis   Chronic atrial fibrillation (HCC)  Metabolic encephalopathy Prerenal Acute Kidney Injury - creatinine improving with IV fluid hydration.  Avoid nephrotoxins and follow BMP.  Hypothermia - Bair Hugger discontinued now and temp has improved.  Mild rhabdomyolysis - CK trending down with IV fluids.  Leukocytosis - WBC trending down with treatment.  Acute Bronchitis - no pneumonia on CXR, continue doxycycline and supportive care.  COPD - continue bronchodilators.  Hyponatremia - improving with IV fluids.  Follow BMP.  Chronic atrial fibrillation - resume back on metoprolol (lower dose) and apixaban.  Elevated PSA - his PCP recently referred him back to urology, he has had 2 benign  prostate biopsies done in the past.  Depression and GAD - resumed home meds.  Chronic alcohol abuse - reportedly up to 30 beers per day, we are monitoring closely for alcohol withdrawal in ICU on CIWA protocol.  Consider IV high dose thiamine if mentation is not improving.   DVT prophylaxis: enoxaparin  Code Status: Full  Family Communication: no one present, will try to reach out by telephone  Disposition: PT eval 6/27 if medically able   Status is: Inpatient  Remains inpatient appropriate because:Altered mental status and Inpatient level of care appropriate due to severity of illness   Dispo: The patient is from: Home              Anticipated d/c is to:  TBD              Anticipated d/c date is: 3 days              Patient currently is not medically stable to d/c.   Consultants:    Procedures:    Antimicrobials:  Doxycycline 6/25>>   Subjective: Pt reports that he has dry cough mostly nonproductive, no chest pain, chronic SOB. He says he has family but they don't care about him being in the hospital. He seems somewhat confused this morning. He wants to eat breakfast.   Objective: Vitals:   03/26/20 0500 03/26/20 0811 03/26/20 0900 03/26/20 1000  BP: 110/71 94/82 104/62 106/65  Pulse: 89 89 89 85  Resp: 18 19 18 18   Temp: 99.9 F (37.7 C) 99.5 F (37.5 C) 99.3 F (37.4 C) 99.5  F (37.5 C)  TempSrc:  Core    SpO2: 95% 94% 96% 95%  Weight:      Height:        Intake/Output Summary (Last 24 hours) at 03/26/2020 1106 Last data filed at 03/26/2020 1000 Gross per 24 hour  Intake 3790.15 ml  Output 1000 ml  Net 2790.15 ml   Filed Weights   03/25/20 1010 03/26/20 0351  Weight: 77.1 kg 86.8 kg    Examination:  General exam: Appears calm and comfortable  Respiratory system: Clear to auscultation. Respiratory effort normal. Cardiovascular system: S1 & S2 heard, RRR. No JVD, murmurs, rubs, gallops or clicks. No pedal edema. Gastrointestinal system: Abdomen is  nondistended, soft and nontender. No organomegaly or masses felt. Normal bowel sounds heard. Central nervous system: Alert and oriented. No focal neurological deficits. Extremities: Symmetric 5 x 5 power. Skin: No rashes, lesions or ulcers Psychiatry: Judgement and insight appear normal. Mood & affect appropriate.   Data Reviewed: I have personally reviewed following labs and imaging studies  CBC: Recent Labs  Lab 03/25/20 1032 03/26/20 0357  WBC 15.5* 7.3  NEUTROABS 13.9* 5.9  HGB 12.6* 10.6*  HCT 36.8* 30.7*  MCV 95.1 94.8  PLT 218 384    Basic Metabolic Panel: Recent Labs  Lab 03/25/20 1032 03/25/20 2220 03/26/20 0357  NA 122* 128* 132*  K 4.5 3.3* 3.2*  CL 81* 97* 100  CO2 17* 18* 20*  GLUCOSE 70 179* 158*  BUN 99* 55* 43*  CREATININE 4.40* 1.73* 1.18  CALCIUM 8.9 7.2* 7.6*  MG 1.9  --  1.8  PHOS 2.6  --   --     GFR: Estimated Creatinine Clearance: 66.2 mL/min (by C-G formula based on SCr of 1.18 mg/dL).  Liver Function Tests: Recent Labs  Lab 03/25/20 1032 03/25/20 2220  AST 59* 38  ALT 40 30  ALKPHOS 50 38  BILITOT 1.9* 1.2  PROT 7.7 5.7*  ALBUMIN 3.8 2.8*    CBG: No results for input(s): GLUCAP in the last 168 hours.  Recent Results (from the past 240 hour(s))  Culture, blood (routine x 2)     Status: None (Preliminary result)   Collection Time: 03/25/20 10:32 AM   Specimen: BLOOD RIGHT ARM  Result Value Ref Range Status   Specimen Description BLOOD RIGHT ARM  Final   Special Requests   Final    BOTTLES DRAWN AEROBIC AND ANAEROBIC Blood Culture adequate volume   Culture   Final    NO GROWTH < 24 HOURS Performed at [facility], [address] [place], [place] [address]    Report Status PENDING  Incomplete  Culture, blood (routine x 2)     Status: None (Preliminary result)   Collection Time: 03/25/20 10:32 AM   Specimen: BLOOD LEFT ARM  Result Value Ref Range Status   Specimen Description BLOOD LEFT ARM  Final   Special Requests    Final    BOTTLES DRAWN AEROBIC AND ANAEROBIC Blood Culture adequate volume   Culture   Final    NO GROWTH < 24 HOURS Performed at [facility], [address] [place], [place] [address]    Report Status PENDING  Incomplete  Urine culture     Status: None   Collection Time: 03/25/20 11:46 AM   Specimen: Urine, Clean Catch  Result Value Ref Range Status   Specimen Description   Final    URINE, CLEAN CATCH Performed at [facility], [address] [place], [place] [address]  Special Requests   Final    NONE Performed at [facility], [address], [place], [place] [address]    Culture   Final    NO GROWTH Performed at [facility], [place] [address] [place], [place] [address]    Report Status 03/26/2020 FINAL  Final  SARS Coronavirus 2 by RT PCR (hospital order, performed in [facility] hospital lab) Nasopharyngeal Nasopharyngeal Swab     Status: None   Collection Time: 03/25/20 11:47 AM   Specimen: Nasopharyngeal Swab  Result Value Ref Range Status   SARS Coronavirus 2 NEGATIVE NEGATIVE Final    Comment: (NOTE) SARS-CoV-2 target nucleic acids are NOT DETECTED.  The SARS-CoV-2 RNA is generally detectable in upper and lower respiratory specimens during the acute phase of infection. The lowest concentration of SARS-CoV-2 viral copies this assay can detect is 250 copies / mL. A negative result does not preclude SARS-CoV-2 infection and should not be used as the sole basis for treatment or other patient management decisions.  A negative result may occur with improper specimen collection / handling, submission of specimen other than nasopharyngeal swab, presence of viral mutation(s) within the areas targeted by this assay, and inadequate number of viral copies (<250 copies / mL). A negative result must be combined with clinical observations, patient history, and epidemiological information.  Fact Sheet for Patients:   StrictlyIdeas.no  Fact Sheet  for Healthcare Providers: BankingDealers.co.za  This test is not yet approved or  cleared by the [place] FDA and has been authorized for detection and/or diagnosis of SARS-CoV-2 by FDA under an Emergency Use Authorization (EUA).  This EUA will remain in effect (meaning this test can be used) for the duration of the COVID-19 declaration under Section 564(b)(1) of the Act, 21 U.S.C. section 360bbb-3(b)(1), unless the authorization is terminated or revoked sooner.  Performed at [facility], [address], [place], [place] [address]   MRSA PCR Screening     Status: Abnormal   Collection Time: 03/25/20  7:15 PM   Specimen: Nasopharyngeal  Result Value Ref Range Status   MRSA by PCR POSITIVE (A) NEGATIVE Final    Comment:        The GeneXpert MRSA Assay (FDA approved for NASAL specimens only), is one component of a comprehensive MRSA colonization surveillance program. It is not intended to diagnose MRSA infection nor to guide or monitor treatment for MRSA infections. RESULT CALLED TO, READ BACK BY AND VERIFIED WITH: TETREAULT,H ON 03/25/20 AT 2220 BY LOY,C Performed at [facility], [address], [place], [place] [address]      Radiology Studies: CT Head Wo Contrast  Result Date: 03/25/2020 CLINICAL DATA:  Altered mental status. EXAM: CT HEAD WITHOUT CONTRAST CT CERVICAL SPINE WITHOUT CONTRAST TECHNIQUE: Multidetector CT imaging of the head and cervical spine was performed following the standard protocol without intravenous contrast. Multiplanar CT image reconstructions of the cervical spine were also generated. COMPARISON:  None. FINDINGS: CT HEAD FINDINGS Brain: Mild chronic ischemic white matter disease is noted. No mass effect or midline shift is noted. Ventricular size is within normal limits. There is no evidence of mass lesion, hemorrhage or acute infarction. Vascular: No hyperdense vessel or unexpected calcification. Skull: Normal. Negative for  fracture or focal lesion. Sinuses/Orbits: No acute finding. Other: None. CT CERVICAL SPINE FINDINGS Alignment: Minimal grade 1 retrolisthesis of C5-6 is noted secondary to moderate degenerative disc disease at this level. Skull base and vertebrae: No acute fracture. No primary bone lesion or focal  pathologic process. Soft tissues and spinal canal: No prevertebral fluid or swelling. No visible canal hematoma. Disc levels: Moderate degenerative disc disease is noted at C5-6 and C6-7 with anterior osteophyte formation. Upper chest: Negative. Other: None. IMPRESSION: 1. Mild chronic ischemic white matter disease. No acute intracranial abnormality seen. 2. Multilevel degenerative disc disease. No acute abnormality seen in the cervical spine. Electronically Signed   By: Marijo Conception M.D.   On: 03/25/2020 11:37   CT Cervical Spine Wo Contrast  Result Date: 03/25/2020 CLINICAL DATA:  Altered mental status. EXAM: CT HEAD WITHOUT CONTRAST CT CERVICAL SPINE WITHOUT CONTRAST TECHNIQUE: Multidetector CT imaging of the head and cervical spine was performed following the standard protocol without intravenous contrast. Multiplanar CT image reconstructions of the cervical spine were also generated. COMPARISON:  None. FINDINGS: CT HEAD FINDINGS Brain: Mild chronic ischemic white matter disease is noted. No mass effect or midline shift is noted. Ventricular size is within normal limits. There is no evidence of mass lesion, hemorrhage or acute infarction. Vascular: No hyperdense vessel or unexpected calcification. Skull: Normal. Negative for fracture or focal lesion. Sinuses/Orbits: No acute finding. Other: None. CT CERVICAL SPINE FINDINGS Alignment: Minimal grade 1 retrolisthesis of C5-6 is noted secondary to moderate degenerative disc disease at this level. Skull base and vertebrae: No acute fracture. No primary bone lesion or focal pathologic process. Soft tissues and spinal canal: No prevertebral fluid or swelling. No  visible canal hematoma. Disc levels: Moderate degenerative disc disease is noted at C5-6 and C6-7 with anterior osteophyte formation. Upper chest: Negative. Other: None. IMPRESSION: 1. Mild chronic ischemic white matter disease. No acute intracranial abnormality seen. 2. Multilevel degenerative disc disease. No acute abnormality seen in the cervical spine. Electronically Signed   By: Marijo Conception M.D.   On: 03/25/2020 11:37   Portable chest 1 View  Result Date: 03/26/2020 CLINICAL DATA:  Acute renal failure EXAM: PORTABLE CHEST 1 VIEW COMPARISON:  None. FINDINGS: The heart size and mediastinal contours are within normal limits. There is left basilar atelectasis. Lungs are otherwise clear. The visualized skeletal structures are unremarkable. IMPRESSION: Left basilar atelectasis. Electronically Signed   By: Ulyses Jarred M.D.   On: 03/26/2020 05:10   DG Chest Port 1 View  Result Date: 03/25/2020 CLINICAL DATA:  69 year old male with history of shortness of breath. Altered mental status. EXAM: PORTABLE CHEST 1 VIEW COMPARISON:  Chest x-ray 03/25/2019. FINDINGS: Lung volumes are normal. No consolidative airspace disease. No pleural effusions. No pneumothorax. No pulmonary nodule or mass noted. Pulmonary vasculature and the cardiomediastinal silhouette are within normal limits. Atherosclerosis in the thoracic aorta. IMPRESSION: 1.  No radiographic evidence of acute cardiopulmonary disease. 2. Aortic atherosclerosis. Electronically Signed   By: Vinnie Langton M.D.   On: 03/25/2020 11:04   DG Chest Port 1V same Day  Result Date: 03/25/2020 CLINICAL DATA:  Worsening shortness of breath, atrial fibrillation, COPD, hypertension, smoker EXAM: PORTABLE CHEST 1 VIEW COMPARISON:  Portable exam 1734 hours compared to 1047 hours FINDINGS: Normal heart size, mediastinal contours, and pulmonary vascularity. Atherosclerotic calcification aorta. Mild bibasilar atelectasis. Lungs otherwise clear. No infiltrate, pleural  effusion or pneumothorax. Osseous structures demineralized. IMPRESSION: Bibasilar atelectasis. Aortic Atherosclerosis (ICD10-I70.0). Electronically Signed   By: Lavonia Dana M.D.   On: 03/25/2020 17:56   Scheduled Meds:  apixaban  5 mg Oral BID   buPROPion  300 mg Oral Daily   Chlorhexidine Gluconate Cloth  6 each Topical [id number]   folic acid  1 mg Oral  Daily   LORazepam  0-4 mg Intravenous Q6H   Followed by   Derrill Memo ON 03/27/2020] LORazepam  0-4 mg Intravenous Q12H   metoprolol  100 mg Oral Daily   metoprolol tartrate  2.5 mg Intravenous Once   mometasone-formoterol  2 puff Inhalation BID   multivitamin with minerals  1 tablet Oral Daily   mupirocin ointment  1 application Nasal BID   nicotine  21 mg Transdermal Daily   pantoprazole  40 mg Oral Daily   QUEtiapine  50 mg Oral QHS   thiamine  100 mg Oral Daily   Or   thiamine  100 mg Intravenous Daily   umeclidinium bromide  1 puff Inhalation Daily   Continuous Infusions:  sodium chloride 75 mL/hr at 03/26/20 1000   doxycycline (VIBRAMYCIN) IV Stopped (03/26/20 0812)   sodium chloride       LOS: 1 day   Critical Care Procedure Note Authorized and Performed by: Murvin Natal MD  Total Critical Care time:  33 mins  Due to a high probability of clinically significant, life threatening deterioration, the patient required my highest level of preparedness to intervene emergently and I personally spent this critical care time directly and personally managing the patient.  This critical care time included obtaining a history; examining the patient, pulse oximetry; ordering and review of studies; arranging urgent treatment with development of a management plan; evaluation of patient's response of treatment; frequent reassessment; and discussions with other providers.  This critical care time was performed to assess and manage the high probability of imminent and life threatening deterioration that could result in multi-organ failure.  It was  exclusive of separately billable procedures and treating other patients and teaching time.    Irwin Brakeman, MD How to contact the [facility] Attending or Consulting provider [place] or covering provider during after hours [place], for this patient?  Check the care team in [facility] and look for a) attending/consulting [facility] provider listed and b) the [facility] team listed Log into [email or web address] and use [place]'s universal password to access. If you do not have the password, please contact the hospital operator. Locate the [facility] provider you are looking for under [facility] and page to a number that you can be directly reached. If you still have difficulty reaching the provider, please page the [facility] (Director on Call) for the Hospitalists listed on amion for assistance.  03/26/2020, 11:06 AM

## 2020-03-27 DIAGNOSIS — E878 Other disorders of electrolyte and fluid balance, not elsewhere classified: Secondary | ICD-10-CM | POA: Diagnosis not present

## 2020-03-27 LAB — BASIC METABOLIC PANEL
Anion gap: 7 (ref 5–15)
BUN: 17 mg/dL (ref 8–23)
CO2: 23 mmol/L (ref 22–32)
Calcium: 7.4 mg/dL — ABNORMAL LOW (ref 8.9–10.3)
Chloride: 106 mmol/L (ref 98–111)
Creatinine, Ser: 0.69 mg/dL (ref 0.61–1.24)
GFR calc Af Amer: >60 mL/min (ref >60)
GFR calc non Af Amer: >60 mL/min (ref >60)
Glucose, Bld: 92 mg/dL (ref 70–99)
Potassium: 3.6 mmol/L (ref 3.5–5.1)
Sodium: 136 mmol/L (ref 135–145)

## 2020-03-27 LAB — CBC WITH DIFFERENTIAL/PLATELET
Abs Immature Granulocytes: 0.39 10*3/uL — ABNORMAL HIGH (ref 0.00–0.07)
Basophils Absolute: 0 10*3/uL (ref 0.0–0.1)
Basophils Relative: 1 %
Eosinophils Absolute: 0.1 10*3/uL (ref 0.0–0.5)
Eosinophils Relative: 1 %
HCT: 34.9 % — ABNORMAL LOW (ref 39.0–52.0)
Hemoglobin: 11.5 g/dL — ABNORMAL LOW (ref 13.0–17.0)
Immature Granulocytes: 6 %
Lymphocytes Relative: 19 %
Lymphs Abs: 1.2 10*3/uL (ref 0.7–4.0)
MCH: 32.8 pg (ref 26.0–34.0)
MCHC: 33 g/dL (ref 30.0–36.0)
MCV: 99.4 fL (ref 80.0–100.0)
Monocytes Absolute: 0.7 10*3/uL (ref 0.1–1.0)
Monocytes Relative: 10 %
Neutro Abs: 4 10*3/uL (ref 1.7–7.7)
Neutrophils Relative %: 63 %
Platelets: 175 10*3/uL (ref 150–400)
RBC: 3.51 MIL/uL — ABNORMAL LOW (ref 4.22–5.81)
RDW: 13.6 % (ref 11.5–15.5)
WBC: 6.4 10*3/uL (ref 4.0–10.5)
nRBC: 0 % (ref 0.0–0.2)

## 2020-03-27 LAB — PHOSPHORUS: Phosphorus: 1 mg/dL — CL (ref 2.5–4.6)

## 2020-03-27 LAB — MAGNESIUM: Magnesium: 1.6 mg/dL — ABNORMAL LOW (ref 1.7–2.4)

## 2020-03-27 LAB — CK: Total CK: 284 U/L (ref 49–397)

## 2020-03-27 MED ORDER — MAGNESIUM SULFATE 4 GM/100ML IV SOLN
4.0000 g | Freq: Once | INTRAVENOUS | Status: AC
  Administered 2020-03-27: 4 g via INTRAVENOUS
  Filled 2020-03-27: qty 100

## 2020-03-27 MED ORDER — MAGNESIUM SULFATE IN D5W 1-5 GM/100ML-% IV SOLN
1.0000 g | Freq: Once | INTRAVENOUS | Status: AC
  Administered 2020-03-27: 1 g via INTRAVENOUS
  Filled 2020-03-27: qty 100

## 2020-03-27 MED ORDER — POTASSIUM PHOSPHATES 15 MMOLE/5ML IV SOLN
30.0000 mmol | Freq: Once | INTRAVENOUS | Status: AC
  Administered 2020-03-27: 30 mmol via INTRAVENOUS
  Filled 2020-03-27: qty 10

## 2020-03-27 MED ORDER — CHLORDIAZEPOXIDE HCL 25 MG PO CAPS
25.0000 mg | ORAL_CAPSULE | Freq: Three times a day (TID) | ORAL | Status: DC
  Administered 2020-03-27 – 2020-03-30 (×10): 25 mg via ORAL
  Filled 2020-03-27 (×9): qty 1

## 2020-03-27 MED ORDER — DOXYCYCLINE HYCLATE 100 MG PO TABS
100.0000 mg | ORAL_TABLET | Freq: Two times a day (BID) | ORAL | Status: AC
  Administered 2020-03-27 – 2020-03-29 (×4): 100 mg via ORAL
  Filled 2020-03-27 (×5): qty 1

## 2020-03-27 NOTE — Progress Notes (Signed)
Pt's wife called to check on patient and stated that he will be seeing an oncologist on 7/15. She just wanted [place] to be aware.

## 2020-03-27 NOTE — Progress Notes (Signed)
Lama contacted about phosphorus of 1.0 called at 0445 this morning.

## 2020-03-27 NOTE — Progress Notes (Addendum)
PROGRESS NOTE   Jacob Frank  QVZ:[id number] DOB: September 30, 1951 DOA: 03/25/2020 PCP: [name], MD   Chief Complaint  Patient presents with  . Altered Mental Status    Brief Admission History:  69 y.o. male alcohol abuser, smoker with chronic atrial fibrillation, COPD, hypertension, depression and generalized anxiety, hearing loss, osteoarthritis and elevated PSA with repeated benign biopsy x2, apparently called EMS while having hallucinations at home he apparently was found by police down on the bathroom floor naked confused with altered mentation and somnolence.  He was hypothermic.  He reports that he had been drinking alcohol most of the evening.  When patient arrived he was oriented to self only and markedly confused and somnolent.  He was unable to report what happened to him clearly but reported that he was trying to take a shower just prior to falling and could not get up off the floor.  Assessment & Plan:   Principal Problem:   Acute renal failure (ARF) (HCC) Active Problems:   COPD (chronic obstructive pulmonary disease) (HCC)   HTN (hypertension), benign   Tobacco dependence   GAD (generalized anxiety disorder)   Elevated PSA   Hyponatremia   Alcohol abuse   Rhabdomyolysis   Leukocytosis   Chronic atrial fibrillation (HCC)   Refeeding syndrome   Hypomagnesemia   Hypophosphatemia  1. Metabolic encephalopathy - improving with treatments.  2. Prerenal Acute Kidney Injury - creatinine improving with IV fluid hydration.  Avoid nephrotoxins and follow BMP.  3. Hypothermia - RESOLVED.  Bair Hugger discontinued now and temp has improved.  4. Mild rhabdomyolysis - Resolved now.  CK trending down with IV fluids.  5. Leukocytosis - WBC trending down with treatment.  6. Acute Bronchitis - no pneumonia on CXR, continue doxycycline and supportive care.  7. COPD - continue bronchodilators.  8. Refeeding Syndrome - Replacing Mg and Phos IV and recheck in AM.  9. Hyponatremia -  improving with IV fluids.  Follow BMP.  10. Chronic atrial fibrillation - resumed back on metoprolol (lower dose) and apixaban.  11. Elevated PSA - his PCP recently referred him back to urology, he has had 2 benign prostate biopsies done in the past.  12. Depression and GAD - resumed home meds.  13. Chronic alcohol abuse - reportedly up to 30 beers per day, we are monitoring closely for alcohol withdrawal in ICU on CIWA protocol.  Consider IV high dose thiamine if mentation is not improving. Start librium 25 mg TID.   DVT prophylaxis: enoxaparin  Code Status: Full  Family Communication: ex wife updated by telephone 6/27  Disposition: PT eval ordered 6/27    Status is: Inpatient  Remains inpatient appropriate because:Altered mental status and Inpatient level of care appropriate due to severity of illness  Awaiting PT eval, transfer to telemetry 6/27   Dispo: The patient is from: Home              Anticipated d/c is to:  TBD              Anticipated d/c date is: 2-3 days              Patient currently is not medically stable to d/c.  Consultants:     Procedures:     Antimicrobials:  Doxycycline 6/25>>   Subjective: Pt a little less confused today.  He is oriented to person and place.    Objective: Vitals:   03/27/20 0800 03/27/20 0900 03/27/20 1000 03/27/20 1100  BP: 119/79  111/66 122/82 110/69  Pulse: 92 96 80 95  Resp: 20 (!) 27 19 18   Temp: 99.9 F (37.7 C) 99.5 F (37.5 C) 99.1 F (37.3 C) 99.1 F (37.3 C)  TempSrc:      SpO2: 91% 96% 92% 94%  Weight:      Height:        Intake/Output Summary (Last 24 hours) at 03/27/2020 1304 Last data filed at 03/27/2020 4098 Gross per 24 hour  Intake 2031.67 ml  Output 2700 ml  Net -668.33 ml   Filed Weights   03/25/20 1010 03/26/20 0351 03/27/20 0500  Weight: 77.1 kg 86.8 kg 83 kg    Examination:  General exam: He is confused and slurred speech at times, Appears calm and comfortable  Respiratory system: Clear to  auscultation. Respiratory effort normal. Poor dentition.  Cardiovascular system: S1 & S2 heard, RRR. No JVD, murmurs, rubs, gallops or clicks. No pedal edema. Gastrointestinal system: Abdomen is nondistended, soft and nontender. No organomegaly or masses felt. Normal bowel sounds heard. Central nervous system: Alert and oriented to person and place. No focal neurological deficits. Extremities: Symmetric 5 x 5 power. Skin: No rashes, lesions or ulcers diffuse onychomycosis  Psychiatry: Judgement and insight appear normal. Mood & affect appropriate.   Data Reviewed: I have personally reviewed following labs and imaging studies  CBC: Recent Labs  Lab 03/25/20 1032 03/26/20 0357 03/27/20 0356  WBC 15.5* 7.3 6.4  NEUTROABS 13.9* 5.9 4.0  HGB 12.6* 10.6* 11.5*  HCT 36.8* 30.7* 34.9*  MCV 95.1 94.8 99.4  PLT 218 186 119    Basic Metabolic Panel: Recent Labs  Lab 03/25/20 1032 03/25/20 2220 03/26/20 0357 03/27/20 0356  NA 122* 128* 132* 136  K 4.5 3.3* 3.2* 3.6  CL 81* 97* 100 106  CO2 17* 18* 20* 23  GLUCOSE 70 179* 158* 92  BUN 99* 55* 43* 17  CREATININE 4.40* 1.73* 1.18 0.69  CALCIUM 8.9 7.2* 7.6* 7.4*  MG 1.9  --  1.8 1.6*  PHOS 2.6  --   --  1.0*    GFR: Estimated Creatinine Clearance: 89.6 mL/min (by C-G formula based on SCr of 0.69 mg/dL).  Liver Function Tests: Recent Labs  Lab 03/25/20 1032 03/25/20 2220  AST 59* 38  ALT 40 30  ALKPHOS 50 38  BILITOT 1.9* 1.2  PROT 7.7 5.7*  ALBUMIN 3.8 2.8*    CBG: No results for input(s): GLUCAP in the last 168 hours.  Recent Results (from the past 240 hour(s))  Culture, blood (routine x 2)     Status: None (Preliminary result)   Collection Time: 03/25/20 10:32 AM   Specimen: BLOOD RIGHT ARM  Result Value Ref Range Status   Specimen Description BLOOD RIGHT ARM  Final   Special Requests   Final    BOTTLES DRAWN AEROBIC AND ANAEROBIC Blood Culture adequate volume   Culture   Final    NO GROWTH < 24  HOURS Performed at [facility], [address], [place], [place] [address]    Report Status PENDING  Incomplete  Culture, blood (routine x 2)     Status: None (Preliminary result)   Collection Time: 03/25/20 10:32 AM   Specimen: BLOOD LEFT ARM  Result Value Ref Range Status   Specimen Description BLOOD LEFT ARM  Final   Special Requests   Final    BOTTLES DRAWN AEROBIC AND ANAEROBIC Blood Culture adequate volume   Culture   Final    NO GROWTH < 24  HOURS Performed at [facility], [address], [place], [place] [address]    Report Status PENDING  Incomplete  Urine culture     Status: None   Collection Time: 03/25/20 11:46 AM   Specimen: Urine, Clean Catch  Result Value Ref Range Status   Specimen Description   Final    URINE, CLEAN CATCH Performed at [facility], [address] [place], [place] [address]    Special Requests   Final    NONE Performed at [facility], [address], [place], [place] [address]    Culture   Final    NO GROWTH Performed at [facility], [place] [address] [place], [place] [address]    Report Status 03/26/2020 FINAL  Final  SARS Coronavirus 2 by RT PCR (hospital order, performed in [facility] hospital lab) Nasopharyngeal Nasopharyngeal Swab     Status: None   Collection Time: 03/25/20 11:47 AM   Specimen: Nasopharyngeal Swab  Result Value Ref Range Status   SARS Coronavirus 2 NEGATIVE NEGATIVE Final    Comment: (NOTE) SARS-CoV-2 target nucleic acids are NOT DETECTED.  The SARS-CoV-2 RNA is generally detectable in upper and lower respiratory specimens during the acute phase of infection. The lowest concentration of SARS-CoV-2 viral copies this assay can detect is 250 copies / mL. A negative result does not preclude SARS-CoV-2 infection and should not be used as the sole basis for treatment or other patient management decisions.  A negative result may occur with improper specimen collection / handling, submission of specimen other than  nasopharyngeal swab, presence of viral mutation(s) within the areas targeted by this assay, and inadequate number of viral copies (<250 copies / mL). A negative result must be combined with clinical observations, patient history, and epidemiological information.  Fact Sheet for Patients:   StrictlyIdeas.no  Fact Sheet for Healthcare Providers: BankingDealers.co.za  This test is not yet approved or  cleared by the [place] FDA and has been authorized for detection and/or diagnosis of SARS-CoV-2 by FDA under an Emergency Use Authorization (EUA).  This EUA will remain in effect (meaning this test can be used) for the duration of the COVID-19 declaration under Section 564(b)(1) of the Act, 21 U.S.C. section 360bbb-3(b)(1), unless the authorization is terminated or revoked sooner.  Performed at [facility], [address], [place], [place] [address]   MRSA PCR Screening     Status: Abnormal   Collection Time: 03/25/20  7:15 PM   Specimen: Nasopharyngeal  Result Value Ref Range Status   MRSA by PCR POSITIVE (A) NEGATIVE Final    Comment:        The GeneXpert MRSA Assay (FDA approved for NASAL specimens only), is one component of a comprehensive MRSA colonization surveillance program. It is not intended to diagnose MRSA infection nor to guide or monitor treatment for MRSA infections. RESULT CALLED TO, READ BACK BY AND VERIFIED WITH: TETREAULT,H ON 03/25/20 AT 2220 BY LOY,C Performed at [facility], [address] [place], [place] [address]      Radiology Studies: Portable chest 1 View  Result Date: 03/26/2020 CLINICAL DATA:  Acute renal failure EXAM: PORTABLE CHEST 1 VIEW COMPARISON:  None. FINDINGS: The heart size and mediastinal contours are within normal limits. There is left basilar atelectasis. Lungs are otherwise clear. The visualized skeletal structures are unremarkable. IMPRESSION: Left basilar atelectasis.  Electronically Signed   By: Ulyses Jarred M.D.   On: 03/26/2020 05:10   DG Chest Port 1V same Day  Result  Date: 03/25/2020 CLINICAL DATA:  Worsening shortness of breath, atrial fibrillation, COPD, hypertension, smoker EXAM: PORTABLE CHEST 1 VIEW COMPARISON:  Portable exam 1734 hours compared to 1047 hours FINDINGS: Normal heart size, mediastinal contours, and pulmonary vascularity. Atherosclerotic calcification aorta. Mild bibasilar atelectasis. Lungs otherwise clear. No infiltrate, pleural effusion or pneumothorax. Osseous structures demineralized. IMPRESSION: Bibasilar atelectasis. Aortic Atherosclerosis (ICD10-I70.0). Electronically Signed   By: Lavonia Dana M.D.   On: 03/25/2020 17:56   Scheduled Meds: . apixaban  5 mg Oral BID  . buPROPion  300 mg Oral Daily  . Chlorhexidine Gluconate Cloth  6 each Topical Q0600  . doxycycline  100 mg Oral Q12H  . feeding supplement (ENSURE ENLIVE)  237 mL Oral BID BM  . folic acid  1 mg Oral Daily  . LORazepam  0-4 mg Intravenous Q6H   Followed by  . LORazepam  0-4 mg Intravenous Q12H  . metoprolol  100 mg Oral Daily  . metoprolol tartrate  2.5 mg Intravenous Once  . mometasone-formoterol  2 puff Inhalation BID  . multivitamin with minerals  1 tablet Oral Daily  . mupirocin ointment  1 application Nasal BID  . nicotine  21 mg Transdermal Daily  . pantoprazole  40 mg Oral Daily  . QUEtiapine  50 mg Oral QHS  . thiamine  100 mg Oral Daily   Or  . thiamine  100 mg Intravenous Daily  . umeclidinium bromide  1 puff Inhalation Daily   Continuous Infusions: . sodium chloride 30 mL/hr at 03/27/20 0759  . potassium PHOSPHATE IVPB (in mmol) 30 mmol (03/27/20 1012)  . sodium chloride       LOS: 2 days   Critical Care Procedure Note Authorized and Performed by: Murvin Natal MD  Total Critical Care time:  31 mins  Due to a high probability of clinically significant, life threatening deterioration, the patient required my highest level of preparedness to  intervene emergently and I personally spent this critical care time directly and personally managing the patient.  This critical care time included obtaining a history; examining the patient, pulse oximetry; ordering and review of studies; arranging urgent treatment with development of a management plan; evaluation of patient's response of treatment; frequent reassessment; and discussions with other providers.  This critical care time was performed to assess and manage the high probability of imminent and life threatening deterioration that could result in multi-organ failure.  It was exclusive of separately billable procedures and treating other patients and teaching time.    Irwin Brakeman, MD How to contact the [facility] Attending or Consulting provider [place] or covering provider during after hours [place], for this patient?  1. Check the care team in [facility] and look for a) attending/consulting TRH provider listed and b) the [facility] team listed 2. Log into [email or web address] and use [place]'s universal password to access. If you do not have the password, please contact the hospital operator. 3. Locate the [facility] provider you are looking for under [facility] and page to a number that you can be directly reached. 4. If you still have difficulty reaching the provider, please page the [facility] (Director on Call) for the Hospitalists listed on amion for assistance.  03/27/2020, 1:04 PM

## 2020-03-28 LAB — BASIC METABOLIC PANEL
Anion gap: 11 (ref 5–15)
BUN: 10 mg/dL (ref 8–23)
CO2: 25 mmol/L (ref 22–32)
Calcium: 7.1 mg/dL — ABNORMAL LOW (ref 8.9–10.3)
Chloride: 96 mmol/L — ABNORMAL LOW (ref 98–111)
Creatinine, Ser: 0.68 mg/dL (ref 0.61–1.24)
GFR calc Af Amer: >60 mL/min (ref >60)
GFR calc non Af Amer: >60 mL/min (ref >60)
Glucose, Bld: 95 mg/dL (ref 70–99)
Potassium: 3.3 mmol/L — ABNORMAL LOW (ref 3.5–5.1)
Sodium: 132 mmol/L — ABNORMAL LOW (ref 135–145)

## 2020-03-28 LAB — PHOSPHORUS: Phosphorus: 1.1 mg/dL — ABNORMAL LOW (ref 2.5–4.6)

## 2020-03-28 LAB — MAGNESIUM: Magnesium: 1.3 mg/dL — ABNORMAL LOW (ref 1.7–2.4)

## 2020-03-28 MED ORDER — MAGNESIUM SULFATE 4 GM/100ML IV SOLN
4.0000 g | Freq: Once | INTRAVENOUS | Status: AC
  Administered 2020-03-28: 4 g via INTRAVENOUS
  Filled 2020-03-28: qty 100

## 2020-03-28 MED ORDER — POTASSIUM CHLORIDE CRYS ER 20 MEQ PO TBCR
40.0000 meq | EXTENDED_RELEASE_TABLET | Freq: Once | ORAL | Status: AC
  Administered 2020-03-28: 40 meq via ORAL
  Filled 2020-03-28: qty 2

## 2020-03-28 MED ORDER — POTASSIUM PHOSPHATES 15 MMOLE/5ML IV SOLN
30.0000 mmol | Freq: Once | INTRAVENOUS | Status: AC
  Administered 2020-03-28: 30 mmol via INTRAVENOUS
  Filled 2020-03-28: qty 10

## 2020-03-28 MED ORDER — METOPROLOL SUCCINATE ER 50 MG PO TB24
150.0000 mg | ORAL_TABLET | Freq: Every day | ORAL | Status: DC
  Administered 2020-03-29 – 2020-03-30 (×2): 150 mg via ORAL
  Filled 2020-03-28 (×2): qty 3

## 2020-03-28 NOTE — Evaluation (Signed)
Physical Therapy Evaluation Patient Details Name: Jacob Frank MRN: [id number] DOB: 29-Dec-1950 Today's Date: 03/28/2020   History of Present Illness  Jacob Frank is a 69 y.o. male alcohol abuser, smoker with chronic atrial fibrillation, COPD, hypertension, depression and generalized anxiety, hearing loss, osteoarthritis and elevated PSA with repeated benign biopsy x2, apparently called EMS while having hallucinations at home he apparently was found by police down on the bathroom floor naked confused with altered mentation and somnolence.  He was hypothermic.  He reports that he had been drinking alcohol most of the evening.  When patient arrived he was oriented to self only and markedly confused and somnolent.  He was unable to report what happened to him clearly but reported that he was trying to take a shower just prior to falling and could not get up off the floor.    Clinical Impression  Patient functioning near baseline for functional mobility and gait, demonstrates good return for ambulation in room/hallways without loss of balance.  Plan:  Patient discharged from physical therapy to care of nursing for ambulation daily as tolerated for length of stay.     Follow Up Recommendations No PT follow up    Equipment Recommendations  None recommended by PT    Recommendations for Other Services       Precautions / Restrictions Precautions Precautions: None Restrictions Weight Bearing Restrictions: No      Mobility  Bed Mobility Overal bed mobility: Independent                Transfers Overall transfer level: Modified independent                  Ambulation/Gait Ambulation/Gait assistance: Modified independent (Device/Increase time) Gait Distance (Feet): 200 Feet Assistive device: None Gait Pattern/deviations: WFL(Within Functional Limits) Gait velocity: slightly decreased   General Gait Details: grossly WFL, demonstrates good return for ambulation in room and  hallways without loss of balance  Stairs            Wheelchair Mobility    Modified Rankin (Stroke Patients Only)       Balance Overall balance assessment: No apparent balance deficits (not formally assessed)                                           Pertinent Vitals/Pain Pain Assessment: No/denies pain    Home Living Family/patient expects to be discharged to:: Private residence Living Arrangements: Alone Available Help at Discharge: Other (Comment) (Patient states he has no help) Type of Home: House Home Access: Stairs to enter Entrance Stairs-Rails: None Entrance Stairs-Number of Steps: 1 Home Layout: One level Home Equipment: Environmental consultant - 2 wheels      Prior Function Level of Independence: Independent         Comments: Hydrographic surveyor, does not drive, uses a moped for Contractor        Extremity/Trunk Assessment   Upper Extremity Assessment Upper Extremity Assessment: Overall WFL for tasks assessed    Lower Extremity Assessment Lower Extremity Assessment: Overall WFL for tasks assessed    Cervical / Trunk Assessment Cervical / Trunk Assessment: Normal  Communication   Communication: No difficulties  Cognition Arousal/Alertness: Awake/alert Behavior During Therapy: WFL for tasks assessed/performed Overall Cognitive Status: Within Functional Limits for tasks assessed  General Comments      Exercises     Assessment/Plan    PT Assessment Patent does not need any further PT services  PT Problem List         PT Treatment Interventions      PT Goals (Current goals can be found in the Care Plan section)  Acute Rehab PT Goals Patient Stated Goal: return home PT Goal Formulation: With patient Time For Goal Achievement: 03/28/20 Potential to Achieve Goals: Good    Frequency     Barriers to discharge        Co-evaluation                AM-PAC PT "6 Clicks" Mobility  Outcome Measure Help needed turning from your back to your side while in a flat bed without using bedrails?: None Help needed moving from lying on your back to sitting on the side of a flat bed without using bedrails?: None Help needed moving to and from a bed to a chair (including a wheelchair)?: None Help needed standing up from a chair using your arms (e.g., wheelchair or bedside chair)?: None Help needed to walk in hospital room?: None Help needed climbing 3-5 steps with a railing? : None 6 Click Score: 24    End of Session   Activity Tolerance: Patient tolerated treatment well Patient left: in bed;with call bell/phone within reach Nurse Communication: Mobility status PT Visit Diagnosis: Unsteadiness on feet (R26.81);Other abnormalities of gait and mobility (R26.89);Muscle weakness (generalized) (M62.81)    Time: 0826-0901 PT Time Calculation (min) (ACUTE ONLY): 35 min   Charges:   PT Evaluation $PT Eval Moderate Complexity: 1 Mod PT Treatments $Therapeutic Activity: 23-37 mins        10:08 AM, 03/28/20 Lonell Grandchild, MPT Physical Therapist with [facility] [phone] office [phone] mobile phone

## 2020-03-28 NOTE — Progress Notes (Signed)
PROGRESS NOTE   Jacob Frank  NWG:[id number] DOB: 03/26/1951 DOA: 03/25/2020 PCP: [name], MD   Chief Complaint  Patient presents with  . Altered Mental Status    Brief Admission History:  69 y.o. male alcohol abuser, smoker with chronic atrial fibrillation, COPD, hypertension, depression and generalized anxiety, hearing loss, osteoarthritis and elevated PSA with repeated benign biopsy x2, apparently called EMS while having hallucinations at home he apparently was found by police down on the bathroom floor naked confused with altered mentation and somnolence.  He was hypothermic.  He reports that he had been drinking alcohol most of the evening.  When patient arrived he was oriented to self only and markedly confused and somnolent.  He was unable to report what happened to him clearly but reported that he was trying to take a shower just prior to falling and could not get up off the floor.  Assessment & Plan:   Principal Problem:   Acute renal failure (ARF) (HCC) Active Problems:   COPD (chronic obstructive pulmonary disease) (HCC)   HTN (hypertension), benign   Tobacco dependence   GAD (generalized anxiety disorder)   Elevated PSA   Hyponatremia   Alcohol abuse   Rhabdomyolysis   Leukocytosis   Chronic atrial fibrillation (HCC)   Refeeding syndrome   Hypomagnesemia   Hypophosphatemia  1. Metabolic encephalopathy - improving with treatments.  2. Prerenal Acute Kidney Injury - creatinine improved with IV fluid hydration.  Avoid nephrotoxins and follow BMP.  3. Hypothermia - RESOLVED.  Bair Hugger discontinued now and temp has improved.  4. Mild rhabdomyolysis - Resolved now.  CK trending down with IV fluids.  5. Leukocytosis - WBC trending down with treatment.  6. Acute Bronchitis - no pneumonia on CXR, continue doxycycline and supportive care.  7. COPD - continue bronchodilators.  8. Refeeding Syndrome - His levels are even lower today, continue aggressively replacing  Mg and Phos IV and recheck in AM.  9. Hyponatremia - improving with IV fluids.  Follow BMP.  10. Chronic atrial fibrillation - resumed back on metoprolol (lower dose) and apixaban.  11. Elevated PSA - his PCP recently referred him back to urology, he has had 2 benign prostate biopsies done in the past.  12. Depression and GAD - resumed home meds.  13. Chronic alcohol abuse - reportedly up to 30 beers per day, we are monitoring closely for alcohol withdrawal in ICU on CIWA protocol.  Consider IV high dose thiamine 500 mg IV qd x 3 doses if mentation is not improving. Continue librium 25 mg TID.   DVT prophylaxis: enoxaparin  Code Status: Full  Family Communication: ex wife updated by telephone 6/27  Disposition: PT eval ordered 6/27    Status is: Inpatient  Remains inpatient appropriate because:Altered mental status and Inpatient level of care appropriate due to severity of illness.  He developed refeeding syndrome and we are working to replace his electrolytes which has required aggressive IV replacement.   Dispo: The patient is from: Home              Anticipated d/c is to:  Home               Anticipated d/c date is: 1-2 days              Patient currently is not medically stable to d/c. Because he developed a refeeding syndrome and we are trying to correct his electrolytes.   Consultants:     Procedures:  Antimicrobials:  Doxycycline 6/25>>   Subjective: Pt says his appetite is improving.     Objective: Vitals:   03/28/20 0800 03/28/20 0844 03/28/20 0846 03/28/20 1459  BP: 94/67   117/77  Pulse: 90   93  Resp:    20  Temp:    98 F (36.7 C)  TempSrc:      SpO2:  93% 93% 98%  Weight:      Height:        Intake/Output Summary (Last 24 hours) at 03/28/2020 1559 Last data filed at 03/28/2020 0855 Gross per 24 hour  Intake 480 ml  Output 2001 ml  Net -1521 ml   Filed Weights   03/26/20 0351 03/27/20 0500 03/28/20 0447  Weight: 86.8 kg 83 kg 63.7 kg     Examination:  General exam: He is confused and slurred speech at times, Appears calm and comfortable  Respiratory system: Clear to auscultation. Respiratory effort normal. Poor dentition.  Cardiovascular system: S1 & S2 heard, RRR. No JVD, murmurs, rubs, gallops or clicks. No pedal edema. Gastrointestinal system: Abdomen is nondistended, soft and nontender. No organomegaly or masses felt. Normal bowel sounds heard. Central nervous system: Alert and oriented to person and place. No focal neurological deficits. Extremities: Symmetric 5 x 5 power. Skin: No rashes, lesions or ulcers diffuse onychomycosis  Psychiatry: Judgement and insight appear normal. Mood & affect appropriate.   Data Reviewed: I have personally reviewed following labs and imaging studies  CBC: Recent Labs  Lab 03/25/20 1032 03/26/20 0357 03/27/20 0356  WBC 15.5* 7.3 6.4  NEUTROABS 13.9* 5.9 4.0  HGB 12.6* 10.6* 11.5*  HCT 36.8* 30.7* 34.9*  MCV 95.1 94.8 99.4  PLT 218 186 993    Basic Metabolic Panel: Recent Labs  Lab 03/25/20 1032 03/25/20 2220 03/26/20 0357 03/27/20 0356 03/28/20 0400  NA 122* 128* 132* 136 132*  K 4.5 3.3* 3.2* 3.6 3.3*  CL 81* 97* 100 106 96*  CO2 17* 18* 20* 23 25  GLUCOSE 70 179* 158* 92 95  BUN 99* 55* 43* 17 10  CREATININE 4.40* 1.73* 1.18 0.69 0.68  CALCIUM 8.9 7.2* 7.6* 7.4* 7.1*  MG 1.9  --  1.8 1.6* 1.3*  PHOS 2.6  --   --  1.0* 1.1*    GFR: Estimated Creatinine Clearance: 80.7 mL/min (by C-G formula based on SCr of 0.68 mg/dL).  Liver Function Tests: Recent Labs  Lab 03/25/20 1032 03/25/20 2220  AST 59* 38  ALT 40 30  ALKPHOS 50 38  BILITOT 1.9* 1.2  PROT 7.7 5.7*  ALBUMIN 3.8 2.8*    CBG: No results for input(s): GLUCAP in the last 168 hours.  Recent Results (from the past 240 hour(s))  Culture, blood (routine x 2)     Status: None (Preliminary result)   Collection Time: 03/25/20 10:32 AM   Specimen: BLOOD RIGHT ARM  Result Value Ref Range  Status   Specimen Description BLOOD RIGHT ARM  Final   Special Requests   Final    BOTTLES DRAWN AEROBIC AND ANAEROBIC Blood Culture adequate volume   Culture   Final    NO GROWTH < 24 HOURS Performed at [facility], [address], [place], [place] [address]    Report Status PENDING  Incomplete  Culture, blood (routine x 2)     Status: None (Preliminary result)   Collection Time: 03/25/20 10:32 AM   Specimen: BLOOD LEFT ARM  Result Value Ref Range Status   Specimen Description BLOOD LEFT ARM  Final   Special Requests   Final    BOTTLES DRAWN AEROBIC AND ANAEROBIC Blood Culture adequate volume   Culture   Final    NO GROWTH < 24 HOURS Performed at [facility], [address], [place], [place] [address]    Report Status PENDING  Incomplete  Urine culture     Status: None   Collection Time: 03/25/20 11:46 AM   Specimen: Urine, Clean Catch  Result Value Ref Range Status   Specimen Description   Final    URINE, CLEAN CATCH Performed at [facility], [address], [place], [place] [address]    Special Requests   Final    NONE Performed at [facility], [address], [place], [place] [address]    Culture   Final    NO GROWTH Performed at [facility], [place] [address] [place], [place] [address]    Report Status 03/26/2020 FINAL  Final  SARS Coronavirus 2 by RT PCR (hospital order, performed in [facility] hospital lab) Nasopharyngeal Nasopharyngeal Swab     Status: None   Collection Time: 03/25/20 11:47 AM   Specimen: Nasopharyngeal Swab  Result Value Ref Range Status   SARS Coronavirus 2 NEGATIVE NEGATIVE Final    Comment: (NOTE) SARS-CoV-2 target nucleic acids are NOT DETECTED.  The SARS-CoV-2 RNA is generally detectable in upper and lower respiratory specimens during the acute phase of infection. The lowest concentration of SARS-CoV-2 viral copies this assay can detect is 250 copies / mL. A negative result does not preclude SARS-CoV-2 infection and should not be  used as the sole basis for treatment or other patient management decisions.  A negative result may occur with improper specimen collection / handling, submission of specimen other than nasopharyngeal swab, presence of viral mutation(s) within the areas targeted by this assay, and inadequate number of viral copies (<250 copies / mL). A negative result must be combined with clinical observations, patient history, and epidemiological information.  Fact Sheet for Patients:   StrictlyIdeas.no  Fact Sheet for Healthcare Providers: BankingDealers.co.za  This test is not yet approved or  cleared by the [place] FDA and has been authorized for detection and/or diagnosis of SARS-CoV-2 by FDA under an Emergency Use Authorization (EUA).  This EUA will remain in effect (meaning this test can be used) for the duration of the COVID-19 declaration under Section 564(b)(1) of the Act, 21 U.S.C. section 360bbb-3(b)(1), unless the authorization is terminated or revoked sooner.  Performed at [facility], [address], [place], [place] [address]   MRSA PCR Screening     Status: Abnormal   Collection Time: 03/25/20  7:15 PM   Specimen: Nasopharyngeal  Result Value Ref Range Status   MRSA by PCR POSITIVE (A) NEGATIVE Final    Comment:        The GeneXpert MRSA Assay (FDA approved for NASAL specimens only), is one component of a comprehensive MRSA colonization surveillance program. It is not intended to diagnose MRSA infection nor to guide or monitor treatment for MRSA infections. RESULT CALLED TO, READ BACK BY AND VERIFIED WITH: TETREAULT,H ON 03/25/20 AT 2220 BY LOY,C Performed at [facility], [address] [place], [place] [address]      Radiology Studies: No results found. Scheduled Meds: . apixaban  5 mg Oral BID  . buPROPion  300 mg Oral Daily  . chlordiazePOXIDE  25 mg Oral TID  . Chlorhexidine Gluconate Cloth  6 each Topical  Q0600  . doxycycline  100  mg Oral Q12H  . feeding supplement (ENSURE ENLIVE)  237 mL Oral BID BM  . folic acid  1 mg Oral Daily  . metoprolol  150 mg Oral Daily  . metoprolol tartrate  2.5 mg Intravenous Once  . mometasone-formoterol  2 puff Inhalation BID  . multivitamin with minerals  1 tablet Oral Daily  . mupirocin ointment  1 application Nasal BID  . nicotine  21 mg Transdermal Daily  . pantoprazole  40 mg Oral Daily  . QUEtiapine  50 mg Oral QHS  . thiamine  100 mg Oral Daily   Or  . thiamine  100 mg Intravenous Daily  . umeclidinium bromide  1 puff Inhalation Daily   Continuous Infusions: . sodium chloride 30 mL/hr at 03/27/20 0759  . potassium PHOSPHATE IVPB (in mmol) 30 mmol (03/28/20 1147)     LOS: 3 days    Irwin Brakeman, MD How to contact the [facility] Attending or Consulting provider [place] or covering provider during after hours [place], for this patient?  1. Check the care team in [facility] and look for a) attending/consulting TRH provider listed and b) the [facility] team listed 2. Log into [email or web address] and use [place]'s universal password to access. If you do not have the password, please contact the hospital operator. 3. Locate the [facility] provider you are looking for under [facility] and page to a number that you can be directly reached. 4. If you still have difficulty reaching the provider, please page the [facility] (Director on Call) for the Hospitalists listed on amion for assistance.  03/28/2020, 3:59 PM

## 2020-03-28 NOTE — Plan of Care (Signed)

## 2020-03-28 NOTE — TOC Progression Note (Signed)
Transition of Care [facility]) - Progression Note    Patient Details  Name: Jacob Frank MRN: [id number] Date of Birth: Sep 20, 1951  Transition of [facility]) CM/SW Contact  Shade Flood, LCSW Phone Number: 03/28/2020, 3:25 PM  Clinical Narrative:     TOC following. Spoke with Morey Hummingbird from [place] today for an update. Morey Hummingbird was here to see patient today. She also spoke with family by phone. At this time, Morey Hummingbird states that pt is clear to go home at dc if that is what he wants. PT has no follow up recommendations for dc stating pt is at baseline.  TOC will meet with pt tomorrow to offer substance abuse treatment resources before dc.  Expected Discharge Plan: [place] Barriers to Discharge: Continued Medical Work up, Unsafe home situation (Patient was found naked at home after calling the police; patient's current mental status makes discharge unsafe at this time as patient lives alone)  Expected Discharge Plan and Services Expected Discharge Plan: [place] In-house Referral: Clinical Social Work   Post Acute Care Choice: [place] arrangements for the past 2 months: Single Family Home                                       Social Determinants of Health (SDOH) Interventions    Readmission Risk Interventions No flowsheet data found.

## 2020-03-29 DIAGNOSIS — R339 Retention of urine, unspecified: Secondary | ICD-10-CM | POA: Diagnosis present

## 2020-03-29 DIAGNOSIS — E559 Vitamin D deficiency, unspecified: Secondary | ICD-10-CM | POA: Diagnosis present

## 2020-03-29 LAB — BASIC METABOLIC PANEL
Anion gap: 11 (ref 5–15)
BUN: 10 mg/dL (ref 8–23)
CO2: 26 mmol/L (ref 22–32)
Calcium: 7.6 mg/dL — ABNORMAL LOW (ref 8.9–10.3)
Chloride: 95 mmol/L — ABNORMAL LOW (ref 98–111)
Creatinine, Ser: 0.62 mg/dL (ref 0.61–1.24)
GFR calc Af Amer: >60 mL/min (ref >60)
GFR calc non Af Amer: >60 mL/min (ref >60)
Glucose, Bld: 102 mg/dL — ABNORMAL HIGH (ref 70–99)
Potassium: 4 mmol/L (ref 3.5–5.1)
Sodium: 132 mmol/L — ABNORMAL LOW (ref 135–145)

## 2020-03-29 LAB — PREALBUMIN: Prealbumin: 13.7 mg/dL — ABNORMAL LOW (ref 18–38)

## 2020-03-29 LAB — ALBUMIN: Albumin: 2.9 g/dL — ABNORMAL LOW (ref 3.5–5.0)

## 2020-03-29 LAB — PHOSPHORUS: Phosphorus: 1.9 mg/dL — ABNORMAL LOW (ref 2.5–4.6)

## 2020-03-29 LAB — MAGNESIUM: Magnesium: 1.4 mg/dL — ABNORMAL LOW (ref 1.7–2.4)

## 2020-03-29 MED ORDER — MAGNESIUM SULFATE 4 GM/100ML IV SOLN
4.0000 g | Freq: Once | INTRAVENOUS | Status: AC
  Administered 2020-03-29: 4 g via INTRAVENOUS

## 2020-03-29 MED ORDER — MAGNESIUM SULFATE 4 GM/100ML IV SOLN: 4.0000 g | Freq: Once | INTRAVENOUS | Status: DC

## 2020-03-29 MED ORDER — VITAMIN D (ERGOCALCIFEROL) 1.25 MG (50000 UNIT) PO CAPS
50000.0000 [IU] | ORAL_CAPSULE | ORAL | Status: DC
  Administered 2020-03-29: 50000 [IU] via ORAL
  Filled 2020-03-29: qty 1

## 2020-03-29 MED ORDER — MAGNESIUM SULFATE 4 GM/100ML IV SOLN
INTRAVENOUS | Status: AC
  Filled 2020-03-29: qty 100

## 2020-03-29 MED ORDER — TAMSULOSIN HCL 0.4 MG PO CAPS
0.4000 mg | ORAL_CAPSULE | Freq: Every day | ORAL | Status: DC
  Administered 2020-03-29: 0.4 mg via ORAL
  Filled 2020-03-29: qty 1

## 2020-03-29 MED ORDER — POTASSIUM PHOSPHATES 15 MMOLE/5ML IV SOLN
20.0000 mmol | Freq: Once | INTRAVENOUS | Status: DC
  Filled 2020-03-29: qty 6.67

## 2020-03-29 NOTE — Progress Notes (Signed)
PROGRESS NOTE   Jacob Frank  IFO:[id number] DOB: September 24, 1951 DOA: 03/25/2020 PCP: [name], MD   Chief Complaint  Patient presents with  . Altered Mental Status   Brief Admission History:  69 y.o. male alcohol abuser, smoker with chronic atrial fibrillation, COPD, hypertension, depression and generalized anxiety, hearing loss, osteoarthritis and elevated PSA with repeated benign biopsy x2, apparently called EMS while having hallucinations at home he apparently was found by police down on the bathroom floor naked confused with altered mentation and somnolence.  He was hypothermic.  He reports that he had been drinking alcohol most of the evening.  When patient arrived he was oriented to self only and markedly confused and somnolent.  He was unable to report what happened to him clearly but reported that he was trying to take a shower just prior to falling and could not get up off the floor.  Assessment & Plan:   Principal Problem:   Acute renal failure (ARF) (HCC) Active Problems:   Alcohol abuse   Rhabdomyolysis   Refeeding syndrome   Hypomagnesemia   Hypophosphatemia   COPD (chronic obstructive pulmonary disease) (HCC)   HTN (hypertension), benign   Tobacco dependence   GAD (generalized anxiety disorder)   Elevated PSA   Hyponatremia   Leukocytosis   Chronic atrial fibrillation (HCC)   Vitamin D deficiency   Acute urinary retention  1. Metabolic encephalopathy - improving with treatments.  2. Prerenal Acute Kidney Injury - creatinine improved with IV fluid hydration.  Avoid nephrotoxins and follow BMP.  3. Acute urinary retention - from BPH, Foley placed 6/29 with more than 1500 mL urinary drainage.  Leave foley in place for now.  Start Flomax.  4. Hypothermia - RESOLVED.  Bair Hugger discontinued now and temp has improved.  5. Mild rhabdomyolysis - Resolved now.  CK trending down with IV fluids.  6. Leukocytosis - WBC trending down with treatment.  7. Acute Bronchitis  - no pneumonia on CXR, continue doxycycline and supportive care.  8. COPD - continue bronchodilators.  9. Refeeding Syndrome - His levels Mg and Phos levels remain low, continue aggressively replacing Mg and Phos IV and recheck in AM.  10. Hyponatremia - exacerbated by urinary retention, I expect marked improvement after foley drainage.  11. Chronic atrial fibrillation - resumed back on metoprolol (lower dose) and apixaban.  12. Elevated PSA - his PCP recently referred him back to urology, he has had 2 benign prostate biopsies done in the past.   I suspect this is from BPH but he needs to be sure to follow up with urology.  13. Depression and GAD - resumed home meds.  14. Chronic alcohol abuse - reportedly up to 30 beers per day, we are monitoring closely for alcohol withdrawal in ICU on CIWA protocol.  Consider IV high dose thiamine 500 mg IV qd x 3 doses if mentation is not improving.  Continue librium 25 mg TID.   DVT prophylaxis: enoxaparin  Code Status: Full  Family Communication: ex wife updated by telephone 6/27, 6/29  Disposition: Home     Status is: Inpatient  Remains inpatient appropriate because:Altered mental status and Inpatient level of care appropriate due to severity of illness.  He developed refeeding syndrome and we are working to replace his electrolytes which has required aggressive IV replacement.   Dispo: The patient is from: Home              Anticipated d/c is to:  Home  Anticipated d/c date is: 1-2 days              Patient currently is not medically stable to d/c. Because he developed a refeeding syndrome and we continue to work to correct his electrolytes. He developed acute  Urinary retention today and foley cath was placed.   Consultants:     Procedures:     Antimicrobials:  Doxycycline 6/25>> 6/29  Subjective: Pt having severe bladder pain and distension this morning.      Objective: Vitals:   03/29/20 0606 03/29/20 0828 03/29/20 1120  03/29/20 1204  BP: (!) 118/94  110/77 116/77  Pulse: (!) 103  (!) 105 87  Resp: 20     Temp: 97.9 F (36.6 C)   98.6 F (37 C)  TempSrc: Oral   Oral  SpO2: 92% 91%  95%  Weight:      Height:        Intake/Output Summary (Last 24 hours) at 03/29/2020 1600 Last data filed at 03/29/2020 1506 Gross per 24 hour  Intake 4650.19 ml  Output --  Net 4650.19 ml   Filed Weights   03/26/20 0351 03/27/20 0500 03/28/20 0447  Weight: 86.8 kg 83 kg 63.7 kg    Examination:  General exam: He is confused and slurred speech at times, Appears calm and comfortable  Respiratory system: Clear to auscultation. Respiratory effort normal. Poor dentition.  Cardiovascular system: S1 & S2 heard, RRR. No JVD, murmurs, rubs, gallops or clicks. No pedal edema. Gastrointestinal system: Abdomen is nondistended, soft and nontender. No organomegaly or masses felt. Normal bowel sounds heard. Central nervous system: Alert and oriented to person and place. No focal neurological deficits. Extremities: Symmetric 5 x 5 power. Skin: No rashes, lesions or ulcers diffuse onychomycosis  Psychiatry: Judgement and insight appear normal. Mood & affect appropriate.   Data Reviewed: I have personally reviewed following labs and imaging studies  CBC: Recent Labs  Lab 03/25/20 1032 03/26/20 0357 03/27/20 0356  WBC 15.5* 7.3 6.4  NEUTROABS 13.9* 5.9 4.0  HGB 12.6* 10.6* 11.5*  HCT 36.8* 30.7* 34.9*  MCV 95.1 94.8 99.4  PLT 218 186 235    Basic Metabolic Panel: Recent Labs  Lab 03/25/20 1032 03/25/20 1032 03/25/20 2220 03/26/20 0357 03/27/20 0356 03/28/20 0400 03/29/20 0439  NA 122*   < > 128* 132* 136 132* 132*  K 4.5   < > 3.3* 3.2* 3.6 3.3* 4.0  CL 81*   < > 97* 100 106 96* 95*  CO2 17*   < > 18* 20* 23 25 26   GLUCOSE 70   < > 179* 158* 92 95 102*  BUN 99*   < > 55* 43* 17 10 10   CREATININE 4.40*   < > 1.73* 1.18 0.69 0.68 0.62  CALCIUM 8.9   < > 7.2* 7.6* 7.4* 7.1* 7.6*  MG 1.9  --   --  1.8 1.6*  1.3* 1.4*  PHOS 2.6  --   --   --  1.0* 1.1* 1.9*   < > = values in this interval not displayed.    GFR: Estimated Creatinine Clearance: 80.7 mL/min (by C-G formula based on SCr of 0.62 mg/dL).  Liver Function Tests: Recent Labs  Lab 03/25/20 1032 03/25/20 2220 03/29/20 0439  AST 59* 38  --   ALT 40 30  --   ALKPHOS 50 38  --   BILITOT 1.9* 1.2  --   PROT 7.7 5.7*  --   ALBUMIN 3.8 2.8* 2.9*  CBG: No results for input(s): GLUCAP in the last 168 hours.  Recent Results (from the past 240 hour(s))  Culture, blood (routine x 2)     Status: None (Preliminary result)   Collection Time: 03/25/20 10:32 AM   Specimen: BLOOD RIGHT ARM  Result Value Ref Range Status   Specimen Description BLOOD RIGHT ARM  Final   Special Requests   Final    BOTTLES DRAWN AEROBIC AND ANAEROBIC Blood Culture adequate volume   Culture   Final    NO GROWTH 4 DAYS Performed at [facility] [address], [place], [place] [address]    Report Status PENDING  Incomplete  Culture, blood (routine x 2)     Status: None (Preliminary result)   Collection Time: 03/25/20 10:32 AM   Specimen: BLOOD LEFT ARM  Result Value Ref Range Status   Specimen Description BLOOD LEFT ARM  Final   Special Requests   Final    BOTTLES DRAWN AEROBIC AND ANAEROBIC Blood Culture adequate volume   Culture   Final    NO GROWTH 4 DAYS Performed at [facility], [address] [place], [place] [address]    Report Status PENDING  Incomplete  Urine culture     Status: None   Collection Time: 03/25/20 11:46 AM   Specimen: Urine, Clean Catch  Result Value Ref Range Status   Specimen Description   Final    URINE, CLEAN CATCH Performed at [facility], [address], [place], [place] [address]    Special Requests   Final    NONE Performed at [facility], [address] [place], [place] [address]    Culture   Final    NO GROWTH Performed at [facility], [place] [address] [place], [place] [address]    Report Status  03/26/2020 FINAL  Final  SARS Coronavirus 2 by RT PCR (hospital order, performed in [facility] hospital lab) Nasopharyngeal Nasopharyngeal Swab     Status: None   Collection Time: 03/25/20 11:47 AM   Specimen: Nasopharyngeal Swab  Result Value Ref Range Status   SARS Coronavirus 2 NEGATIVE NEGATIVE Final    Comment: (NOTE) SARS-CoV-2 target nucleic acids are NOT DETECTED.  The SARS-CoV-2 RNA is generally detectable in upper and lower respiratory specimens during the acute phase of infection. The lowest concentration of SARS-CoV-2 viral copies this assay can detect is 250 copies / mL. A negative result does not preclude SARS-CoV-2 infection and should not be used as the sole basis for treatment or other patient management decisions.  A negative result may occur with improper specimen collection / handling, submission of specimen other than nasopharyngeal swab, presence of viral mutation(s) within the areas targeted by this assay, and inadequate number of viral copies (<250 copies / mL). A negative result must be combined with clinical observations, patient history, and epidemiological information.  Fact Sheet for Patients:   StrictlyIdeas.no  Fact Sheet for Healthcare Providers: BankingDealers.co.za  This test is not yet approved or  cleared by the [place] FDA and has been authorized for detection and/or diagnosis of SARS-CoV-2 by FDA under an Emergency Use Authorization (EUA).  This EUA will remain in effect (meaning this test can be used) for the duration of the COVID-19 declaration under Section 564(b)(1) of the Act, 21 U.S.C. section 360bbb-3(b)(1), unless the authorization is terminated or revoked sooner.  Performed at [facility], [address], [place], [place] [address]   MRSA PCR Screening     Status: Abnormal  Collection Time: 03/25/20  7:15 PM   Specimen: Nasopharyngeal  Result Value Ref Range Status   MRSA by  PCR POSITIVE (A) NEGATIVE Final    Comment:        The GeneXpert MRSA Assay (FDA approved for NASAL specimens only), is one component of a comprehensive MRSA colonization surveillance program. It is not intended to diagnose MRSA infection nor to guide or monitor treatment for MRSA infections. RESULT CALLED TO, READ BACK BY AND VERIFIED WITH: TETREAULT,H ON 03/25/20 AT 2220 BY LOY,C Performed at [facility], [address] [place], [place] [address]      Radiology Studies: No results found. Scheduled Meds: . apixaban  5 mg Oral BID  . buPROPion  300 mg Oral Daily  . chlordiazePOXIDE  25 mg Oral TID  . Chlorhexidine Gluconate Cloth  6 each Topical Q0600  . feeding supplement (ENSURE ENLIVE)  237 mL Oral BID BM  . folic acid  1 mg Oral Daily  . metoprolol  150 mg Oral Daily  . metoprolol tartrate  2.5 mg Intravenous Once  . mometasone-formoterol  2 puff Inhalation BID  . multivitamin with minerals  1 tablet Oral Daily  . mupirocin ointment  1 application Nasal BID  . nicotine  21 mg Transdermal Daily  . pantoprazole  40 mg Oral Daily  . QUEtiapine  50 mg Oral QHS  . thiamine  100 mg Oral Daily   Or  . thiamine  100 mg Intravenous Daily  . umeclidinium bromide  1 puff Inhalation Daily  . Vitamin D (Ergocalciferol)  50,000 Units Oral Q7 days   Continuous Infusions: . sodium chloride 30 mL/hr at 03/27/20 0759  . potassium PHOSPHATE IVPB (in mmol)       LOS: 4 days    Irwin Brakeman, MD How to contact the [facility] Attending or Consulting provider [place] or covering provider during after hours [place], for this patient?  1. Check the care team in [facility] and look for a) attending/consulting TRH provider listed and b) the [facility] team listed 2. Log into [email or web address] and use [place]'s universal password to access. If you do not have the password, please contact the hospital operator. 3. Locate the [facility] provider you are looking for under [facility] and page to a number that  you can be directly reached. 4. If you still have difficulty reaching the provider, please page the [facility] (Director on Call) for the Hospitalists listed on amion for assistance.  03/29/2020, 4:00 PM

## 2020-03-29 NOTE — Care Management Important Message (Signed)
Important Message  Patient Details  Name: Jacob Frank MRN: [id number] Date of Birth: 10-Jul-1951   Medicare Important Message Given:  Yes     Tommy Medal 03/29/2020, 12:14 PM
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
## 2020-03-29 NOTE — Progress Notes (Signed)
0800: Pt c/o severe lower abd pain. Abd distended and painful to touch. On review, pt has had no recorded urine output since 0800 yesterday. Bladder scan performed, shows < 52ml of urine in bladder. Bladder scan repeated by another staff member with same results. Pt's bladder palpable, large and distended. Dr. Wynetta Emery notified of current c/o & sx. 0830: Dr. Wynetta Emery in to evaluate pt. Bladder scan repeated and still only reads <183ml. Verbal order received to insert foley cath and leave in if > 1077ml removed. Foley cath inserted without difficulty, immediate return of 1026ml of clear yellow urine. Foley clamped and will unclamp in 30 minutes. Pt states he feels so much better, abd much softer although still distended. 0900: Foley unclamped and additional 1074ml of urine drained from bladder. Pt currently sleeping soundly. 0930: Total of 2550 emptied from foley cath since insertion. Abd soft, non-distended. Pt denies any c/o at present, is awake and eating breakfast.

## 2020-03-30 LAB — BASIC METABOLIC PANEL
Anion gap: 8 (ref 5–15)
BUN: 13 mg/dL (ref 8–23)
CO2: 27 mmol/L (ref 22–32)
Calcium: 7.7 mg/dL — ABNORMAL LOW (ref 8.9–10.3)
Chloride: 97 mmol/L — ABNORMAL LOW (ref 98–111)
Creatinine, Ser: 0.6 mg/dL — ABNORMAL LOW (ref 0.61–1.24)
GFR calc Af Amer: >60 mL/min (ref >60)
GFR calc non Af Amer: >60 mL/min (ref >60)
Glucose, Bld: 87 mg/dL (ref 70–99)
Potassium: 3.9 mmol/L (ref 3.5–5.1)
Sodium: 132 mmol/L — ABNORMAL LOW (ref 135–145)

## 2020-03-30 LAB — MAGNESIUM: Magnesium: 1.6 mg/dL — ABNORMAL LOW (ref 1.7–2.4)

## 2020-03-30 LAB — PHOSPHORUS: Phosphorus: 2.1 mg/dL — ABNORMAL LOW (ref 2.5–4.6)

## 2020-03-30 MED ORDER — THIAMINE HCL 100 MG PO TABS: 100.0000 mg | ORAL_TABLET | Freq: Every day | ORAL | 3 refills | Status: DC

## 2020-03-30 MED ORDER — TAMSULOSIN HCL 0.4 MG PO CAPS: 0.4000 mg | ORAL_CAPSULE | Freq: Every day | ORAL | 1 refills | Status: DC

## 2020-03-30 MED ORDER — CHLORDIAZEPOXIDE HCL 25 MG PO CAPS: ORAL_CAPSULE | ORAL | 0 refills | Status: AC

## 2020-03-30 MED ORDER — ADULT MULTIVITAMIN W/MINERALS CH: 1.0000 | ORAL_TABLET | Freq: Every day | ORAL | 0 refills | Status: AC

## 2020-03-30 MED ORDER — VITAMIN D (ERGOCALCIFEROL) 1.25 MG (50000 UNIT) PO CAPS: 50000.0000 [IU] | ORAL_CAPSULE | ORAL | 0 refills | Status: DC

## 2020-03-30 MED ORDER — ENSURE ENLIVE PO LIQD: 237.0000 mL | Freq: Two times a day (BID) | ORAL | 12 refills | Status: DC

## 2020-03-30 MED ORDER — METOPROLOL SUCCINATE ER 50 MG PO TB24: 150.0000 mg | ORAL_TABLET | Freq: Every day | ORAL | 3 refills | Status: DC

## 2020-03-30 MED ORDER — K PHOS MONO-SOD PHOS DI & MONO 155-852-130 MG PO TABS
500.0000 mg | ORAL_TABLET | Freq: Two times a day (BID) | ORAL | Status: DC
  Administered 2020-03-30: 500 mg via ORAL
  Filled 2020-03-30: qty 2

## 2020-03-30 MED ORDER — MAGNESIUM SULFATE 2 GM/50ML IV SOLN
2.0000 g | Freq: Once | INTRAVENOUS | Status: AC
  Administered 2020-03-30: 2 g via INTRAVENOUS
  Filled 2020-03-30: qty 50

## 2020-03-30 NOTE — Discharge Summary (Signed)
Physician Discharge Summary  Jacob Frank LKG:[id number] DOB: December 29, 1950 DOA: 03/25/2020  PCP: [name], MD  Admit date: 03/25/2020  Discharge date: 03/30/2020  Admitted From:Home  Disposition:  Home  Recommendations for Outpatient Follow-up:  1. Follow up with PCP in 1-2 weeks 2. Repeat BMP in 1 week. 3. Follow-up with urology as requested in 1 week for reassessment of Foley catheter 4. Remain on Flomax as prescribed daily 5. Continue with other home medications as prescribed below  Home Health: Yes with PT, RN, social work.  Unfortunately, patient cannot be placed to SNF and did not demonstrate any need for skilled facility.  He is a high risk for readmission.  Equipment/Devices: Discharging with Foley catheter  Discharge Condition: Stable  CODE STATUS: Full  Diet recommendation: Heart Healthy  Brief/Interim Summary: 69 y.o.malealcohol abuser, smoker withchronic atrial fibrillation, COPD, hypertension, depression and generalized anxiety, hearing loss, osteoarthritis and elevated PSA with repeated benign biopsy x2, apparently called EMS while having hallucinations at home he apparently was found by police down on the bathroom floor naked confused with altered mentation and somnolence. He was hypothermic. He reports that he had been drinking alcohol most of the evening. When patient arrived he was oriented to self only and markedly confused and somnolent. He was unable to report what happened to him clearly but reported that he was trying to take a shower just prior to falling and could not get up off the floor.  1. Metabolic encephalopathy -likely secondary to alcohol withdrawal, currently resolved 2. Prerenal Acute Kidney Injury -resolved.  Creatinine near 0.6 3. Acute urinary retention - from BPH, Foley placed 6/29 with more than 1500 mL urinary drainage.  Leave foley in place for now.  Start Flomax.  He will need follow-up with urology group in  [place]. 4. Hypothermia - RESOLVED.  Bair Hugger discontinued now and temp has improved.  5. Mild rhabdomyolysis - Resolved now.    6. Acute Bronchitis - no pneumonia on CXR, resolved and treated with doxycycline 7. COPD - continue bronchodilators.  8. Refeeding Syndrome -phosphorus and magnesium levels are now improved.  He appears to be tolerating diet.  Needs to refrain from alcohol use. 9. Hyponatremia -stable and exacerbated by urinary retention.  Likely secondary to beer [place]oto mania.  Discussed cessation of alcohol use. 10. Chronic atrial fibrillation - resumed back on metoprolol (lower dose) and apixaban.  11. Elevated PSA - his PCP recently referred him back to urology, he has had 2 benign prostate biopsies done in the past.   I suspect this is from BPH but he needs to be sure to follow up with urology.   Now started on Flomax. 12. Depression and GAD - resumed home meds.  13. Chronic alcohol abuse - reportedly up to 30 beers per day.  No withdrawal symptoms currently noted.  He will be discharged on Librium taper.  Discharge Diagnoses:  Principal Problem:   Acute renal failure (ARF) (HCC) Active Problems:   COPD (chronic obstructive pulmonary disease) (HCC)   HTN (hypertension), benign   Tobacco dependence   GAD (generalized anxiety disorder)   Elevated PSA   Hyponatremia   Alcohol abuse   Rhabdomyolysis   Leukocytosis   Chronic atrial fibrillation (HCC)   Refeeding syndrome   Hypomagnesemia   Hypophosphatemia   Vitamin D deficiency   Acute urinary retention    Discharge Instructions  Discharge Instructions    Diet - low sodium heart healthy   Complete by: As directed  Increase activity slowly   Complete by: As directed      Allergies as of 03/30/2020      Reactions   Citalopram Other (See Comments)   Question of whether it was making him feel like his throat was "closed up".      Medication List    STOP taking these medications   amLODipine 10 MG  tablet Commonly known as: NORVASC     TAKE these medications   Advair Diskus 250-50 MCG/DOSE Aepb Generic drug: Fluticasone-Salmeterol INHALE ONE PUFF EVERY 12 HOURS. What changed: See the new instructions.   albuterol (2.5 MG/3ML) 0.083% nebulizer solution Commonly known as: PROVENTIL INHALE 1 VIAL VIA NEBULIZER EVERY 6 HOURS AS NEEDED FOR WHEEZING OR SHORTNESS OF BREATH. What changed: See the new instructions.   apixaban 5 MG Tabs tablet Commonly known as: ELIQUIS Take 1 tablet (5 mg total) by mouth 2 (two) times daily.   buPROPion 300 MG 24 hr tablet Commonly known as: WELLBUTRIN XL TAKE (1) TABLET BY MOUTH ONCE DAILY. What changed: See the new instructions.   chlordiazePOXIDE 25 MG capsule Commonly known as: LIBRIUM Take 1 capsule (25 mg total) by mouth 3 (three) times daily for 2 days, THEN 1 capsule (25 mg total) 2 (two) times daily for 2 days, THEN 1 capsule (25 mg total) daily for 3 days. Start taking on: March 30, 2020   feeding supplement (ENSURE ENLIVE) Liqd Take 237 mLs by mouth 2 (two) times daily between meals.   metoprolol succinate 50 MG 24 hr tablet Commonly known as: TOPROL-XL Take 3 tablets (150 mg total) by mouth daily. Take with or immediately following a meal. Start taking on: March 31, 2020 What changed:   medication strength  See the new instructions.   multivitamin with minerals Tabs tablet Take 1 tablet by mouth daily. Start taking on: March 31, 2020   pantoprazole 40 MG tablet Commonly known as: PROTONIX Take 1 tablet (40 mg total) by mouth daily.   QUEtiapine 50 MG tablet Commonly known as: SEROQUEL TAKE 3 TO 4 TABLETS AT BEDTIME. What changed: See the new instructions.   Spiriva HandiHaler 18 MCG inhalation capsule Generic drug: tiotropium Place 1 capsule into inhaler and inhale daily.   tamsulosin 0.4 MG Caps capsule Commonly known as: FLOMAX Take 1 capsule (0.4 mg total) by mouth daily after supper.   thiamine 100 MG  tablet Take 1 tablet (100 mg total) by mouth daily. Start taking on: March 31, 2020   Vitamin D (Ergocalciferol) 1.25 MG (50000 UNIT) Caps capsule Commonly known as: DRISDOL Take 1 capsule (50,000 Units total) by mouth every 7 (seven) days. Start taking on: April 05, 2020       Follow-up Information    McGowen, Adrian Blackwater, MD Follow up on 04/07/2020.   Specialty: Family Medicine Why: 1:00 appointment Contact information: [address] [place] [address] [place] Follow up in 1 week(s).   Contact information: [address] [place] [phone]             Allergies  Allergen Reactions  . Citalopram Other (See Comments)    Question of whether it was making him feel like his throat was "closed up".    Consultations:  None   Procedures/Studies: CT Head Wo Contrast  Result Date: 03/25/2020 CLINICAL DATA:  Altered mental status. EXAM: CT HEAD WITHOUT CONTRAST CT CERVICAL SPINE WITHOUT CONTRAST  TECHNIQUE: Multidetector CT imaging of the head and cervical spine was performed following the standard protocol without intravenous contrast. Multiplanar CT image reconstructions of the cervical spine were also generated. COMPARISON:  None. FINDINGS: CT HEAD FINDINGS Brain: Mild chronic ischemic white matter disease is noted. No mass effect or midline shift is noted. Ventricular size is within normal limits. There is no evidence of mass lesion, hemorrhage or acute infarction. Vascular: No hyperdense vessel or unexpected calcification. Skull: Normal. Negative for fracture or focal lesion. Sinuses/Orbits: No acute finding. Other: None. CT CERVICAL SPINE FINDINGS Alignment: Minimal grade 1 retrolisthesis of C5-6 is noted secondary to moderate degenerative disc disease at this level. Skull base and vertebrae: No acute fracture. No primary bone lesion or focal pathologic process. Soft tissues and spinal canal: No  prevertebral fluid or swelling. No visible canal hematoma. Disc levels: Moderate degenerative disc disease is noted at C5-6 and C6-7 with anterior osteophyte formation. Upper chest: Negative. Other: None. IMPRESSION: 1. Mild chronic ischemic white matter disease. No acute intracranial abnormality seen. 2. Multilevel degenerative disc disease. No acute abnormality seen in the cervical spine. Electronically Signed   By: Marijo Conception M.D.   On: 03/25/2020 11:37   CT Cervical Spine Wo Contrast  Result Date: 03/25/2020 CLINICAL DATA:  Altered mental status. EXAM: CT HEAD WITHOUT CONTRAST CT CERVICAL SPINE WITHOUT CONTRAST TECHNIQUE: Multidetector CT imaging of the head and cervical spine was performed following the standard protocol without intravenous contrast. Multiplanar CT image reconstructions of the cervical spine were also generated. COMPARISON:  None. FINDINGS: CT HEAD FINDINGS Brain: Mild chronic ischemic white matter disease is noted. No mass effect or midline shift is noted. Ventricular size is within normal limits. There is no evidence of mass lesion, hemorrhage or acute infarction. Vascular: No hyperdense vessel or unexpected calcification. Skull: Normal. Negative for fracture or focal lesion. Sinuses/Orbits: No acute finding. Other: None. CT CERVICAL SPINE FINDINGS Alignment: Minimal grade 1 retrolisthesis of C5-6 is noted secondary to moderate degenerative disc disease at this level. Skull base and vertebrae: No acute fracture. No primary bone lesion or focal pathologic process. Soft tissues and spinal canal: No prevertebral fluid or swelling. No visible canal hematoma. Disc levels: Moderate degenerative disc disease is noted at C5-6 and C6-7 with anterior osteophyte formation. Upper chest: Negative. Other: None. IMPRESSION: 1. Mild chronic ischemic white matter disease. No acute intracranial abnormality seen. 2. Multilevel degenerative disc disease. No acute abnormality seen in the cervical spine.  Electronically Signed   By: Marijo Conception M.D.   On: 03/25/2020 11:37   Portable chest 1 View  Result Date: 03/26/2020 CLINICAL DATA:  Acute renal failure EXAM: PORTABLE CHEST 1 VIEW COMPARISON:  None. FINDINGS: The heart size and mediastinal contours are within normal limits. There is left basilar atelectasis. Lungs are otherwise clear. The visualized skeletal structures are unremarkable. IMPRESSION: Left basilar atelectasis. Electronically Signed   By: Ulyses Jarred M.D.   On: 03/26/2020 05:10   DG Chest Port 1 View  Result Date: 03/25/2020 CLINICAL DATA:  69 year old male with history of shortness of breath. Altered mental status. EXAM: PORTABLE CHEST 1 VIEW COMPARISON:  Chest x-ray 03/25/2019. FINDINGS: Lung volumes are normal. No consolidative airspace disease. No pleural effusions. No pneumothorax. No pulmonary nodule or mass noted. Pulmonary vasculature and the cardiomediastinal silhouette are within normal limits. Atherosclerosis in the thoracic aorta. IMPRESSION: 1.  No radiographic evidence of acute cardiopulmonary disease. 2. Aortic atherosclerosis. Electronically Signed   By: Mauri Brooklyn.D.  On: 03/25/2020 11:04   DG Chest Port 1V same Day  Result Date: 03/25/2020 CLINICAL DATA:  Worsening shortness of breath, atrial fibrillation, COPD, hypertension, smoker EXAM: PORTABLE CHEST 1 VIEW COMPARISON:  Portable exam 1734 hours compared to 1047 hours FINDINGS: Normal heart size, mediastinal contours, and pulmonary vascularity. Atherosclerotic calcification aorta. Mild bibasilar atelectasis. Lungs otherwise clear. No infiltrate, pleural effusion or pneumothorax. Osseous structures demineralized. IMPRESSION: Bibasilar atelectasis. Aortic Atherosclerosis (ICD10-I70.0). Electronically Signed   By: Lavonia Dana M.D.   On: 03/25/2020 17:56     Discharge Exam: Vitals:   03/30/20 0821 03/30/20 0900  BP:  (!) 124/92  Pulse:  84  Resp:    Temp:  98.7 F (37.1 C)  SpO2: (S) (!) 88% 97%    Vitals:   03/30/20 0008 03/30/20 0559 03/30/20 0821 03/30/20 0900  BP: 114/80 126/67  (!) 124/92  Pulse: 92 84  84  Resp: 20 17    Temp: 98.5 F (36.9 C) 98.6 F (37 C)  98.7 F (37.1 C)  TempSrc: Oral Oral  Oral  SpO2: 91% (!) 87% (S) (!) 88% 97%  Weight:      Height:        General: Pt is alert, awake, not in acute distress Cardiovascular: RRR, S1/S2 +, no rubs, no gallops Respiratory: CTA bilaterally, no wheezing, no rhonchi Abdominal: Soft, NT, ND, bowel sounds + Extremities: no edema, no cyanosis Foley with clear, yellow urine output noted.    The results of significant diagnostics from this hospitalization (including imaging, microbiology, ancillary and laboratory) are listed below for reference.     Microbiology: Recent Results (from the past 240 hour(s))  Culture, blood (routine x 2)     Status: None (Preliminary result)   Collection Time: 03/25/20 10:32 AM   Specimen: BLOOD RIGHT ARM  Result Value Ref Range Status   Specimen Description BLOOD RIGHT ARM  Final   Special Requests   Final    BOTTLES DRAWN AEROBIC AND ANAEROBIC Blood Culture adequate volume   Culture   Final    NO GROWTH 4 DAYS Performed at [facility], [address] [place], [place] [address]    Report Status PENDING  Incomplete  Culture, blood (routine x 2)     Status: None (Preliminary result)   Collection Time: 03/25/20 10:32 AM   Specimen: BLOOD LEFT ARM  Result Value Ref Range Status   Specimen Description BLOOD LEFT ARM  Final   Special Requests   Final    BOTTLES DRAWN AEROBIC AND ANAEROBIC Blood Culture adequate volume   Culture   Final    NO GROWTH 4 DAYS Performed at [facility], [address], [place], [place] [address]    Report Status PENDING  Incomplete  Urine culture     Status: None   Collection Time: 03/25/20 11:46 AM   Specimen: Urine, Clean Catch  Result Value Ref Range Status   Specimen Description   Final    URINE, CLEAN CATCH Performed at [facility], [address] [place], [place] [address]    Special Requests   Final    NONE Performed at [facility], [address] [place], [place] [address]    Culture   Final    NO GROWTH Performed at [facility], [place] [address], [place], [place] [address]    Report Status 03/26/2020 FINAL  Final  SARS Coronavirus 2 by RT PCR (hospital order, performed in [facility] hospital lab) Nasopharyngeal Nasopharyngeal Swab  Status: None   Collection Time: 03/25/20 11:47 AM   Specimen: Nasopharyngeal Swab  Result Value Ref Range Status   SARS Coronavirus 2 NEGATIVE NEGATIVE Final    Comment: (NOTE) SARS-CoV-2 target nucleic acids are NOT DETECTED.  The SARS-CoV-2 RNA is generally detectable in upper and lower respiratory specimens during the acute phase of infection. The lowest concentration of SARS-CoV-2 viral copies this assay can detect is 250 copies / mL. A negative result does not preclude SARS-CoV-2 infection and should not be used as the sole basis for treatment or other patient management decisions.  A negative result may occur with improper specimen collection / handling, submission of specimen other than nasopharyngeal swab, presence of viral mutation(s) within the areas targeted by this assay, and inadequate number of viral copies (<250 copies / mL). A negative result must be combined with clinical observations, patient history, and epidemiological information.  Fact Sheet for Patients:   StrictlyIdeas.no  Fact Sheet for Healthcare Providers: BankingDealers.co.za  This test is not yet approved or  cleared by the [place] FDA and has been authorized for detection and/or diagnosis of SARS-CoV-2 by FDA under an Emergency Use Authorization (EUA).  This EUA will remain in effect (meaning this test can be used) for the duration of the COVID-19 declaration under Section 564(b)(1) of the Act, 21 U.S.C. section 360bbb-3(b)(1),  unless the authorization is terminated or revoked sooner.  Performed at [facility], [address], [place], [place] [address]   MRSA PCR Screening     Status: Abnormal   Collection Time: 03/25/20  7:15 PM   Specimen: Nasopharyngeal  Result Value Ref Range Status   MRSA by PCR POSITIVE (A) NEGATIVE Final    Comment:        The GeneXpert MRSA Assay (FDA approved for NASAL specimens only), is one component of a comprehensive MRSA colonization surveillance program. It is not intended to diagnose MRSA infection nor to guide or monitor treatment for MRSA infections. RESULT CALLED TO, READ BACK BY AND VERIFIED WITH: TETREAULT,H ON 03/25/20 AT 2220 BY LOY,C Performed at [facility], [address], [place], [place] [address]      Labs: BNP (last 3 results) Recent Labs    03/26/20 0357  BNP 836.6*   Basic Metabolic Panel: Recent Labs  Lab 03/25/20 1032 03/25/20 2220 03/26/20 0357 03/27/20 0356 03/28/20 0400 03/29/20 0439 03/30/20 0433  NA 122*   < > 132* 136 132* 132* 132*  K 4.5   < > 3.2* 3.6 3.3* 4.0 3.9  CL 81*   < > 100 106 96* 95* 97*  CO2 17*   < > 20* 23 25 26 27   GLUCOSE 70   < > 158* 92 95 102* 87  BUN 99*   < > 43* 17 10 10 13   CREATININE 4.40*   < > 1.18 0.69 0.68 0.62 0.60*  CALCIUM 8.9   < > 7.6* 7.4* 7.1* 7.6* 7.7*  MG 1.9  --  1.8 1.6* 1.3* 1.4* 1.6*  PHOS 2.6  --   --  1.0* 1.1* 1.9* 2.1*   < > = values in this interval not displayed.   Liver Function Tests: Recent Labs  Lab 03/25/20 1032 03/25/20 2220 03/29/20 0439  AST 59* 38  --   ALT 40 30  --   ALKPHOS 50 38  --   BILITOT 1.9* 1.2  --   PROT 7.7 5.7*  --   ALBUMIN 3.8 2.8* 2.9*   Recent Labs  Lab  03/25/20 1032  LIPASE 76*   Recent Labs  Lab 03/25/20 1032  AMMONIA 14   CBC: Recent Labs  Lab 03/25/20 1032 03/26/20 0357 03/27/20 0356  WBC 15.5* 7.3 6.4  NEUTROABS 13.9* 5.9 4.0  HGB 12.6* 10.6* 11.5*  HCT 36.8* 30.7* 34.9*  MCV 95.1 94.8 99.4  PLT 218 186 175    Cardiac Enzymes: Recent Labs  Lab 03/25/20 1032 03/26/20 0357 03/27/20 0356  CKTOTAL 1,463* 526* 284   BNP: Invalid input(s): POCBNP CBG: No results for input(s): GLUCAP in the last 168 hours. D-Dimer No results for input(s): DDIMER in the last 72 hours. Hgb A1c No results for input(s): HGBA1C in the last 72 hours. Lipid Profile No results for input(s): CHOL, HDL, LDLCALC, TRIG, CHOLHDL, LDLDIRECT in the last 72 hours. Thyroid function studies No results for input(s): TSH, T4TOTAL, T3FREE, THYROIDAB in the last 72 hours.  Invalid input(s): FREET3 Anemia work up No results for input(s): VITAMINB12, FOLATE, FERRITIN, TIBC, IRON, RETICCTPCT in the last 72 hours. Urinalysis    Component Value Date/Time   COLORURINE YELLOW 03/25/2020 1145   APPEARANCEUR CLEAR 03/25/2020 1145   LABSPEC 1.011 03/25/2020 1145   PHURINE 5.0 03/25/2020 1145   GLUCOSEU NEGATIVE 03/25/2020 1145   GLUCOSEU NEGATIVE 05/23/2012 1044   HGBUR LARGE (A) 03/25/2020 1145   BILIRUBINUR NEGATIVE 03/25/2020 1145   BILIRUBINUR neg 04/21/2012 1155   KETONESUR NEGATIVE 03/25/2020 1145   PROTEINUR NEGATIVE 03/25/2020 1145   UROBILINOGEN 0.2 05/23/2012 1044   NITRITE NEGATIVE 03/25/2020 1145   LEUKOCYTESUR TRACE (A) 03/25/2020 1145   Sepsis Labs Invalid input(s): PROCALCITONIN,  WBC,  LACTICIDVEN Microbiology Recent Results (from the past 240 hour(s))  Culture, blood (routine x 2)     Status: None (Preliminary result)   Collection Time: 03/25/20 10:32 AM   Specimen: BLOOD RIGHT ARM  Result Value Ref Range Status   Specimen Description BLOOD RIGHT ARM  Final   Special Requests   Final    BOTTLES DRAWN AEROBIC AND ANAEROBIC Blood Culture adequate volume   Culture   Final    NO GROWTH 4 DAYS Performed at [facility], [address], [place], [place] [address]    Report Status PENDING  Incomplete  Culture, blood (routine x 2)     Status: None (Preliminary result)   Collection Time: 03/25/20 10:32 AM    Specimen: BLOOD LEFT ARM  Result Value Ref Range Status   Specimen Description BLOOD LEFT ARM  Final   Special Requests   Final    BOTTLES DRAWN AEROBIC AND ANAEROBIC Blood Culture adequate volume   Culture   Final    NO GROWTH 4 DAYS Performed at [facility], [address], [place], [place] [address]    Report Status PENDING  Incomplete  Urine culture     Status: None   Collection Time: 03/25/20 11:46 AM   Specimen: Urine, Clean Catch  Result Value Ref Range Status   Specimen Description   Final    URINE, CLEAN CATCH Performed at [facility], [address], [place], [place] [address]    Special Requests   Final    NONE Performed at [facility], [address] [place], [place] [address]    Culture   Final    NO GROWTH Performed at [facility], [place] [address], [place], [place] [address]    Report Status 03/26/2020 FINAL  Final  SARS Coronavirus 2 by RT PCR (hospital order, performed in [facility] hospital lab) Nasopharyngeal Nasopharyngeal Swab  Status: None   Collection Time: 03/25/20 11:47 AM   Specimen: Nasopharyngeal Swab  Result Value Ref Range Status   SARS Coronavirus 2 NEGATIVE NEGATIVE Final    Comment: (NOTE) SARS-CoV-2 target nucleic acids are NOT DETECTED.  The SARS-CoV-2 RNA is generally detectable in upper and lower respiratory specimens during the acute phase of infection. The lowest concentration of SARS-CoV-2 viral copies this assay can detect is 250 copies / mL. A negative result does not preclude SARS-CoV-2 infection and should not be used as the sole basis for treatment or other patient management decisions.  A negative result may occur with improper specimen collection / handling, submission of specimen other than nasopharyngeal swab, presence of viral mutation(s) within the areas targeted by this assay, and inadequate number of viral copies (<250 copies / mL). A negative result must be combined with clinical observations, patient  history, and epidemiological information.  Fact Sheet for Patients:   StrictlyIdeas.no  Fact Sheet for Healthcare Providers: BankingDealers.co.za  This test is not yet approved or  cleared by the [place] FDA and has been authorized for detection and/or diagnosis of SARS-CoV-2 by FDA under an Emergency Use Authorization (EUA).  This EUA will remain in effect (meaning this test can be used) for the duration of the COVID-19 declaration under Section 564(b)(1) of the Act, 21 U.S.C. section 360bbb-3(b)(1), unless the authorization is terminated or revoked sooner.  Performed at [facility], [address], [place], [place] [address]   MRSA PCR Screening     Status: Abnormal   Collection Time: 03/25/20  7:15 PM   Specimen: Nasopharyngeal  Result Value Ref Range Status   MRSA by PCR POSITIVE (A) NEGATIVE Final    Comment:        The GeneXpert MRSA Assay (FDA approved for NASAL specimens only), is one component of a comprehensive MRSA colonization surveillance program. It is not intended to diagnose MRSA infection nor to guide or monitor treatment for MRSA infections. RESULT CALLED TO, READ BACK BY AND VERIFIED WITH: TETREAULT,H ON 03/25/20 AT 2220 BY LOY,C Performed at [facility], [address], [place], [place] [address]      Time coordinating discharge: 35 minutes  SIGNED:   Rodena Goldmann, DO [facility] 03/30/2020, 1:07 PM  If 7PM-7AM, please contact night-coverage [email or web address]

## 2020-03-30 NOTE — Care Management Important Message (Signed)
Important Message  Patient Details  Name: Jacob Frank MRN: [id number] Date of Birth: 05/03/1951   Medicare Important Message Given:  Yes     Tommy Medal 03/30/2020, 2:47 PM

## 2020-03-30 NOTE — TOC Transition Note (Signed)
Transition of Care [facility]) - CM/SW Discharge Note   Patient Details  Name: Jacob Frank MRN: [id number] Date of Birth: [birth year]  Transition of Care [facility]) CM/SW Contact:  Salome Arnt, LCSW Phone Number: 03/30/2020, 2:51 PM   Clinical Narrative:  Pt d/c home today with home health PT/RN/SW. Referred to [facility]. LCSW notified pt's wife of d/c who will pick up pt today. Pt aware. LCSW made hospital follow up appointment with PCP and put appointment on AVS.      Final next level of care: [place] Barriers to Discharge: Barriers Resolved   Patient Goals and CMS Choice Patient states their goals for this hospitalization and ongoing recovery are::  (Unable to assess due to patient's altered mental status)      Discharge Placement                  Name of family member notified: Mardene Celeste- wife Patient and family notified of of transfer: 03/30/20  Discharge Plan and Services In-house Referral: Clinical Social Work   Post Acute Care Choice: Home Health                    HH Arranged: RN, Social Work, PT [place] Agency: [facility] [facility] [employer] Date [place]: 03/30/20 Time [place]: [place] Representative spoke with at [place]: [place] ([place]) Interventions     Readmission Risk Interventions No flowsheet data found.

## 2020-03-31 ENCOUNTER — Telehealth: Payer: Self-pay

## 2020-03-31 LAB — CULTURE, BLOOD (ROUTINE X 2)
Culture: NO GROWTH
Culture: NO GROWTH
Special Requests: ADEQUATE
Special Requests: ADEQUATE

## 2020-03-31 NOTE — TOC Progression Note (Signed)
Transition of Care [facility]) - Progression Note    Patient Details  Name: Jacob Frank MRN: [id number] Date of Birth: 1950/12/26  Transition of [facility]) CM/SW Contact  Salome Arnt, [place]on[place] Phone Number: 03/31/2020, 8:00 AM  Clinical Narrative:   Order placed prior to d/c for rolling walker. LCSW notified [employer] who will drop ship to pt's home. LCSW left voicemail for pt.    Expected Discharge Plan: [place] Barriers to Discharge: Barriers Resolved  Expected Discharge Plan and Services Expected Discharge Plan: [place] In-house Referral: Clinical Social Work   Post Acute Care Choice: [place] arrangements for the past 2 months: [place] Expected Discharge Date: 03/30/20                         HH Arranged: RN, Social Work, PT [place] [facility]: [facility] [employer] Date [place]: 03/30/20 Time [place]: [place] Representative spoke with at [place]: [place] ([place]) Interventions    Readmission Risk Interventions No flowsheet data found.

## 2020-03-31 NOTE — Telephone Encounter (Signed)
Patient stated he had a foley cathter done twice while in ED and is concerned about it. Advised he had an appt with PCP next week and urology appt on 7/15.

## 2020-03-31 NOTE — Telephone Encounter (Signed)
LM requesting call back to complete TCM and confirm hospital follow up.   

## 2020-03-31 NOTE — Telephone Encounter (Signed)
Patient asked to speak to [facility], he would not go into detail with me. Call back number is [phone]

## 2020-04-01 ENCOUNTER — Other Ambulatory Visit: Payer: Self-pay

## 2020-04-01 NOTE — Telephone Encounter (Signed)
LM requesting call back to complete TCM and confirm hospital follow up.   

## 2020-04-02 DIAGNOSIS — I482 Chronic atrial fibrillation, unspecified: Secondary | ICD-10-CM | POA: Diagnosis not present

## 2020-04-02 DIAGNOSIS — F411 Generalized anxiety disorder: Secondary | ICD-10-CM | POA: Diagnosis not present

## 2020-04-02 DIAGNOSIS — F329 Major depressive disorder, single episode, unspecified: Secondary | ICD-10-CM | POA: Diagnosis not present

## 2020-04-02 DIAGNOSIS — E538 Deficiency of other specified B group vitamins: Secondary | ICD-10-CM | POA: Diagnosis not present

## 2020-04-02 DIAGNOSIS — J449 Chronic obstructive pulmonary disease, unspecified: Secondary | ICD-10-CM | POA: Diagnosis not present

## 2020-04-02 DIAGNOSIS — F17210 Nicotine dependence, cigarettes, uncomplicated: Secondary | ICD-10-CM | POA: Diagnosis not present

## 2020-04-02 DIAGNOSIS — L89513 Pressure ulcer of right ankle, stage 3: Secondary | ICD-10-CM | POA: Diagnosis not present

## 2020-04-02 DIAGNOSIS — I10 Essential (primary) hypertension: Secondary | ICD-10-CM | POA: Diagnosis not present

## 2020-04-02 DIAGNOSIS — M1991 Primary osteoarthritis, unspecified site: Secondary | ICD-10-CM | POA: Diagnosis not present

## 2020-04-04 DIAGNOSIS — I10 Essential (primary) hypertension: Secondary | ICD-10-CM | POA: Diagnosis not present

## 2020-04-04 DIAGNOSIS — I482 Chronic atrial fibrillation, unspecified: Secondary | ICD-10-CM | POA: Diagnosis not present

## 2020-04-04 DIAGNOSIS — F329 Major depressive disorder, single episode, unspecified: Secondary | ICD-10-CM | POA: Diagnosis not present

## 2020-04-04 DIAGNOSIS — J449 Chronic obstructive pulmonary disease, unspecified: Secondary | ICD-10-CM | POA: Diagnosis not present

## 2020-04-04 DIAGNOSIS — F411 Generalized anxiety disorder: Secondary | ICD-10-CM | POA: Diagnosis not present

## 2020-04-04 DIAGNOSIS — E538 Deficiency of other specified B group vitamins: Secondary | ICD-10-CM | POA: Diagnosis not present

## 2020-04-04 DIAGNOSIS — L89513 Pressure ulcer of right ankle, stage 3: Secondary | ICD-10-CM | POA: Diagnosis not present

## 2020-04-04 DIAGNOSIS — F17210 Nicotine dependence, cigarettes, uncomplicated: Secondary | ICD-10-CM | POA: Diagnosis not present

## 2020-04-04 DIAGNOSIS — M1991 Primary osteoarthritis, unspecified site: Secondary | ICD-10-CM | POA: Diagnosis not present

## 2020-04-06 ENCOUNTER — Telehealth: Payer: Self-pay

## 2020-04-06 NOTE — Telephone Encounter (Signed)
Received VM, Estill Bamberg w/ [facility] calling to request VO for SN. 2 x4 week, every other week for 4 weeks, 1 prn. He has stage 3 ulcer on R ankle, .4x.3cm. Plan to treat with hydrogel and foam dressing.   Okay for SN?

## 2020-04-06 NOTE — Telephone Encounter (Signed)
Yes, fine

## 2020-04-06 NOTE — Telephone Encounter (Signed)
Detailed message left stating okay for VO for skilled nursing.

## 2020-04-07 ENCOUNTER — Other Ambulatory Visit: Payer: Self-pay | Admitting: Family Medicine

## 2020-04-07 ENCOUNTER — Inpatient Hospital Stay: Payer: Medicare HMO | Admitting: Family Medicine

## 2020-04-07 DIAGNOSIS — I482 Chronic atrial fibrillation, unspecified: Secondary | ICD-10-CM | POA: Diagnosis not present

## 2020-04-07 DIAGNOSIS — J449 Chronic obstructive pulmonary disease, unspecified: Secondary | ICD-10-CM | POA: Diagnosis not present

## 2020-04-07 DIAGNOSIS — F17210 Nicotine dependence, cigarettes, uncomplicated: Secondary | ICD-10-CM | POA: Diagnosis not present

## 2020-04-07 DIAGNOSIS — F411 Generalized anxiety disorder: Secondary | ICD-10-CM | POA: Diagnosis not present

## 2020-04-07 DIAGNOSIS — E538 Deficiency of other specified B group vitamins: Secondary | ICD-10-CM | POA: Diagnosis not present

## 2020-04-07 DIAGNOSIS — M1991 Primary osteoarthritis, unspecified site: Secondary | ICD-10-CM | POA: Diagnosis not present

## 2020-04-07 DIAGNOSIS — F329 Major depressive disorder, single episode, unspecified: Secondary | ICD-10-CM | POA: Diagnosis not present

## 2020-04-07 DIAGNOSIS — I10 Essential (primary) hypertension: Secondary | ICD-10-CM | POA: Diagnosis not present

## 2020-04-07 DIAGNOSIS — L89513 Pressure ulcer of right ankle, stage 3: Secondary | ICD-10-CM | POA: Diagnosis not present

## 2020-04-07 NOTE — Telephone Encounter (Signed)
Refill request for Ventolin but I don't see this on his current medication list.  Looks like it was d/c but couldn't seem to find documentation about d/c.  Please advise.

## 2020-04-08 ENCOUNTER — Telehealth: Payer: Self-pay

## 2020-04-08 NOTE — Telephone Encounter (Signed)
1x week for 3 weeks  1 every other week for 4 wks   Calling for verbal orders Joey PT, [facility]  [phone]  Verbal order was given to [facility] PT

## 2020-04-09 DIAGNOSIS — J449 Chronic obstructive pulmonary disease, unspecified: Secondary | ICD-10-CM | POA: Diagnosis not present

## 2020-04-09 DIAGNOSIS — M1991 Primary osteoarthritis, unspecified site: Secondary | ICD-10-CM | POA: Diagnosis not present

## 2020-04-09 DIAGNOSIS — I10 Essential (primary) hypertension: Secondary | ICD-10-CM | POA: Diagnosis not present

## 2020-04-09 DIAGNOSIS — F17210 Nicotine dependence, cigarettes, uncomplicated: Secondary | ICD-10-CM | POA: Diagnosis not present

## 2020-04-09 DIAGNOSIS — F329 Major depressive disorder, single episode, unspecified: Secondary | ICD-10-CM | POA: Diagnosis not present

## 2020-04-09 DIAGNOSIS — F411 Generalized anxiety disorder: Secondary | ICD-10-CM | POA: Diagnosis not present

## 2020-04-09 DIAGNOSIS — E538 Deficiency of other specified B group vitamins: Secondary | ICD-10-CM | POA: Diagnosis not present

## 2020-04-09 DIAGNOSIS — I482 Chronic atrial fibrillation, unspecified: Secondary | ICD-10-CM | POA: Diagnosis not present

## 2020-04-09 DIAGNOSIS — L89513 Pressure ulcer of right ankle, stage 3: Secondary | ICD-10-CM | POA: Diagnosis not present

## 2020-04-11 ENCOUNTER — Encounter: Payer: Self-pay | Admitting: Family Medicine

## 2020-04-11 ENCOUNTER — Telehealth: Payer: Self-pay

## 2020-04-11 ENCOUNTER — Inpatient Hospital Stay: Payer: Medicare HMO | Admitting: Family Medicine

## 2020-04-11 NOTE — Telephone Encounter (Signed)
Patient needs prescription for cane.

## 2020-04-11 NOTE — Telephone Encounter (Signed)
OK, rx placed on your desk.

## 2020-04-11 NOTE — Telephone Encounter (Signed)
Patient was in the hospital 6/25-6/30. Originally scheduled for f/u with PCP on 7/8 and rescheduled for 7/12. Requesting Rx for cane.  Please advise, thanks.

## 2020-04-12 DIAGNOSIS — I482 Chronic atrial fibrillation, unspecified: Secondary | ICD-10-CM | POA: Diagnosis not present

## 2020-04-12 DIAGNOSIS — F411 Generalized anxiety disorder: Secondary | ICD-10-CM | POA: Diagnosis not present

## 2020-04-12 DIAGNOSIS — F329 Major depressive disorder, single episode, unspecified: Secondary | ICD-10-CM | POA: Diagnosis not present

## 2020-04-12 DIAGNOSIS — E538 Deficiency of other specified B group vitamins: Secondary | ICD-10-CM | POA: Diagnosis not present

## 2020-04-12 DIAGNOSIS — M1991 Primary osteoarthritis, unspecified site: Secondary | ICD-10-CM | POA: Diagnosis not present

## 2020-04-12 DIAGNOSIS — F17210 Nicotine dependence, cigarettes, uncomplicated: Secondary | ICD-10-CM | POA: Diagnosis not present

## 2020-04-12 DIAGNOSIS — L89513 Pressure ulcer of right ankle, stage 3: Secondary | ICD-10-CM | POA: Diagnosis not present

## 2020-04-12 DIAGNOSIS — J449 Chronic obstructive pulmonary disease, unspecified: Secondary | ICD-10-CM | POA: Diagnosis not present

## 2020-04-12 DIAGNOSIS — I10 Essential (primary) hypertension: Secondary | ICD-10-CM | POA: Diagnosis not present

## 2020-04-12 NOTE — Telephone Encounter (Signed)
Patient contacted and would like Rx mailed to him due to transportation issues. Rx placed up front to be mailed.

## 2020-04-14 ENCOUNTER — Ambulatory Visit (INDEPENDENT_AMBULATORY_CARE_PROVIDER_SITE_OTHER): Payer: Medicare HMO | Admitting: Urology

## 2020-04-14 ENCOUNTER — Other Ambulatory Visit: Payer: Self-pay

## 2020-04-14 ENCOUNTER — Encounter: Payer: Self-pay | Admitting: Urology

## 2020-04-14 VITALS — BP 89/60 | HR 70 | Temp 97.0°F | Ht 69.0 in | Wt 140.4 lb

## 2020-04-14 DIAGNOSIS — N401 Enlarged prostate with lower urinary tract symptoms: Secondary | ICD-10-CM | POA: Insufficient documentation

## 2020-04-14 DIAGNOSIS — R972 Elevated prostate specific antigen [PSA]: Secondary | ICD-10-CM | POA: Diagnosis not present

## 2020-04-14 DIAGNOSIS — R339 Retention of urine, unspecified: Secondary | ICD-10-CM

## 2020-04-14 DIAGNOSIS — N138 Other obstructive and reflux uropathy: Secondary | ICD-10-CM

## 2020-04-14 MED ORDER — TAMSULOSIN HCL 0.4 MG PO CAPS: 0.4000 mg | ORAL_CAPSULE | Freq: Every day | ORAL | 11 refills | Status: AC
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
## 2020-04-14 NOTE — Progress Notes (Signed)
Urological Symptom Review  Patient is experiencing the following symptoms: none   Review of Systems  Gastrointestinal (upper)  : Negative for upper GI symptoms  Gastrointestinal (lower) : Negative for lower GI symptoms  Constitutional : Negative for symptoms  Skin: Negative for skin symptoms  Eyes: Blurred vision  Ear/Nose/Throat : Negative for Ear/Nose/Throat symptoms  Hematologic/Lymphatic: Negative for Hematologic/Lymphatic symptoms  Cardiovascular : Negative for cardiovascular symptoms  Respiratory : Shortness of breath  Endocrine: Excessive thirst  Musculoskeletal: Negative for musculoskeletal symptoms  Neurological: Dizziness  Psychologic: Depression    Fill and Pull Catheter Removal  Patient is present today for a catheter removal.  Patient was cleaned and prepped in a sterile fashion 154ml of sterile water/ saline was instilled into the bladder when the patient felt the urge to urinate. 69ml of water was then drained from the balloon.  A 16FR foley cath was removed from the bladder no complications were noted .  Patient as then given some time to void on their own.  Patient cannot void. Pt. will come back this afternoon for PVR. Performed by: Rana Hochstein, LPN  Follow up/ Additional notes: Per MD note

## 2020-04-14 NOTE — Patient Instructions (Signed)
Acute Urinary Retention, Male ° °Acute urinary retention means that you cannot pee (urinate) at all, or that you pee too little and your bladder is not emptied completely. If it is not treated, it can lead to kidney damage or other serious problems. °Follow these instructions at home: °· Take over-the-counter and prescription medicines only as told by your doctor. Ask your doctor what medicines you should stay away from. Do not take any medicine unless your doctor says it is okay to do so. °· If you were sent home with a tube that drains the bladder (catheter), take care of it as told by your doctor. °· Drink enough fluid to keep your pee clear or pale yellow. °· If you were given an antibiotic, take it as told by your doctor. Do not stop taking the antibiotic even if you start to feel better. °· Do not use any products that contain nicotine or tobacco, such as cigarettes and e-cigarettes. If you need help quitting, ask your doctor. °· Watch for changes in your symptoms. Tell your doctor about them. °· If told, track changes in your blood pressure at home. Tell your doctor about them. °· Keep all follow-up visits as told by your doctor. This is important. °Contact a doctor if: °· You have spasms or you leak pee when you have spasms. °Get help right away if: °· You have chills or a fever. °· You have a tube that drains the bladder and: °? The tube stops draining pee. °? The tube falls out. °· You have blood in your pee. °Summary °· Acute urinary retention means that you have problems peeing. It may mean that you cannot pee at all, or that you pee too little. °· If this condition is not treated, it can lead to kidney damage or other serious problems. °· If you were sent home with a tube that drains the bladder, take care of it as told by your doctor. °· Monitor any changes in your symptoms. Tell your doctor about any changes. °This information is not intended to replace advice given to you by your health care  provider. Make sure you discuss any questions you have with your health care provider. °Document Revised: 12/04/2018 Document Reviewed: 10/19/2016 °Elsevier Patient Education © 2020 Elsevier Inc. ° °

## 2020-04-15 ENCOUNTER — Other Ambulatory Visit: Payer: Self-pay | Admitting: Family Medicine

## 2020-04-15 ENCOUNTER — Encounter: Payer: Self-pay | Admitting: Family Medicine

## 2020-04-15 ENCOUNTER — Telehealth: Payer: Self-pay

## 2020-04-15 DIAGNOSIS — L89513 Pressure ulcer of right ankle, stage 3: Secondary | ICD-10-CM | POA: Diagnosis not present

## 2020-04-15 DIAGNOSIS — I482 Chronic atrial fibrillation, unspecified: Secondary | ICD-10-CM | POA: Diagnosis not present

## 2020-04-15 DIAGNOSIS — M1991 Primary osteoarthritis, unspecified site: Secondary | ICD-10-CM | POA: Diagnosis not present

## 2020-04-15 DIAGNOSIS — F329 Major depressive disorder, single episode, unspecified: Secondary | ICD-10-CM | POA: Diagnosis not present

## 2020-04-15 DIAGNOSIS — R5381 Other malaise: Secondary | ICD-10-CM | POA: Diagnosis not present

## 2020-04-15 DIAGNOSIS — E538 Deficiency of other specified B group vitamins: Secondary | ICD-10-CM | POA: Diagnosis not present

## 2020-04-15 DIAGNOSIS — F411 Generalized anxiety disorder: Secondary | ICD-10-CM | POA: Diagnosis not present

## 2020-04-15 DIAGNOSIS — R339 Retention of urine, unspecified: Secondary | ICD-10-CM | POA: Diagnosis not present

## 2020-04-15 DIAGNOSIS — F1010 Alcohol abuse, uncomplicated: Secondary | ICD-10-CM | POA: Diagnosis not present

## 2020-04-15 DIAGNOSIS — J449 Chronic obstructive pulmonary disease, unspecified: Secondary | ICD-10-CM | POA: Diagnosis not present

## 2020-04-15 DIAGNOSIS — N281 Cyst of kidney, acquired: Secondary | ICD-10-CM | POA: Diagnosis not present

## 2020-04-15 DIAGNOSIS — I10 Essential (primary) hypertension: Secondary | ICD-10-CM | POA: Diagnosis not present

## 2020-04-15 DIAGNOSIS — N3289 Other specified disorders of bladder: Secondary | ICD-10-CM | POA: Diagnosis not present

## 2020-04-15 DIAGNOSIS — F17210 Nicotine dependence, cigarettes, uncomplicated: Secondary | ICD-10-CM | POA: Diagnosis not present

## 2020-04-15 NOTE — Telephone Encounter (Signed)
FYI: Received a VM from [place] with [facility] calling to let us know patient had missed therapy visit due to current admission at [facility] to have another foley cath placed.

## 2020-04-15 NOTE — Telephone Encounter (Signed)
Noted  

## 2020-04-17 DIAGNOSIS — I10 Essential (primary) hypertension: Secondary | ICD-10-CM | POA: Diagnosis not present

## 2020-04-17 DIAGNOSIS — F17210 Nicotine dependence, cigarettes, uncomplicated: Secondary | ICD-10-CM | POA: Diagnosis not present

## 2020-04-17 DIAGNOSIS — M1991 Primary osteoarthritis, unspecified site: Secondary | ICD-10-CM | POA: Diagnosis not present

## 2020-04-17 DIAGNOSIS — J449 Chronic obstructive pulmonary disease, unspecified: Secondary | ICD-10-CM | POA: Diagnosis not present

## 2020-04-17 DIAGNOSIS — F411 Generalized anxiety disorder: Secondary | ICD-10-CM | POA: Diagnosis not present

## 2020-04-17 DIAGNOSIS — L89513 Pressure ulcer of right ankle, stage 3: Secondary | ICD-10-CM | POA: Diagnosis not present

## 2020-04-17 DIAGNOSIS — E538 Deficiency of other specified B group vitamins: Secondary | ICD-10-CM | POA: Diagnosis not present

## 2020-04-17 DIAGNOSIS — F329 Major depressive disorder, single episode, unspecified: Secondary | ICD-10-CM | POA: Diagnosis not present

## 2020-04-17 DIAGNOSIS — I482 Chronic atrial fibrillation, unspecified: Secondary | ICD-10-CM | POA: Diagnosis not present

## 2020-04-18 ENCOUNTER — Telehealth: Payer: Self-pay

## 2020-04-18 NOTE — Telephone Encounter (Signed)
Tried to reach out to patient for further details. Phone just continued to ring and eventually line rang busy. Will try again later

## 2020-04-18 NOTE — Telephone Encounter (Signed)
Patient called and requested to speak to Dr. Anitra Lauth or his nurse.  I told him a clinical assistant would have to call him back.  His number where he can be reached is [phone].

## 2020-04-19 ENCOUNTER — Telehealth: Payer: Self-pay

## 2020-04-19 DIAGNOSIS — I482 Chronic atrial fibrillation, unspecified: Secondary | ICD-10-CM | POA: Diagnosis not present

## 2020-04-19 DIAGNOSIS — F17210 Nicotine dependence, cigarettes, uncomplicated: Secondary | ICD-10-CM

## 2020-04-19 DIAGNOSIS — M1991 Primary osteoarthritis, unspecified site: Secondary | ICD-10-CM

## 2020-04-19 DIAGNOSIS — F329 Major depressive disorder, single episode, unspecified: Secondary | ICD-10-CM

## 2020-04-19 DIAGNOSIS — E538 Deficiency of other specified B group vitamins: Secondary | ICD-10-CM

## 2020-04-19 DIAGNOSIS — J449 Chronic obstructive pulmonary disease, unspecified: Secondary | ICD-10-CM | POA: Diagnosis not present

## 2020-04-19 DIAGNOSIS — L89513 Pressure ulcer of right ankle, stage 3: Secondary | ICD-10-CM | POA: Diagnosis not present

## 2020-04-19 DIAGNOSIS — F411 Generalized anxiety disorder: Secondary | ICD-10-CM

## 2020-04-19 DIAGNOSIS — Z960 Presence of urogenital implants: Secondary | ICD-10-CM

## 2020-04-19 DIAGNOSIS — I10 Essential (primary) hypertension: Secondary | ICD-10-CM | POA: Diagnosis not present

## 2020-04-19 NOTE — Telephone Encounter (Signed)
Home health orders received [facility] for [facility] health initiation orders: Yes.  Home health re-certification orders: No. Patient last seen by ordering physician for this condition: 02/24/20. Must be less than 90 days for re-certification and less than 30 days prior for initiation. Visit must have been for the condition the orders are being placed.  Patient meets criteria for Physician to sign orders: No.        Current med list has been attached: Yes        Orders placed on physicians desk for signature: 04/19/20 (date) If patient does not meet criteria for orders to be signed: pt was called to schedule appt. Appt is scheduled for 04/26/20.   Emilee Hero

## 2020-04-19 NOTE — Telephone Encounter (Signed)
Tried to reach out to patient regarding call yesterday. Phone just continuously rang with no answer

## 2020-04-19 NOTE — Telephone Encounter (Signed)
Signed and put in box to go up front. Signed:  Crissie Sickles, MD           04/19/2020

## 2020-04-20 ENCOUNTER — Ambulatory Visit: Payer: Medicare HMO | Admitting: Urology

## 2020-04-20 ENCOUNTER — Telehealth: Payer: Self-pay

## 2020-04-20 NOTE — Telephone Encounter (Signed)
Patient called and is requesting pain meds and to speak with Dr. Idelle Leech assistant.  Please call [phone]

## 2020-04-20 NOTE — Telephone Encounter (Signed)
Tylenol is the only medicine I want him taking for pain. Narcotic pain medicine will only make it harder for him to urinate.

## 2020-04-20 NOTE — Telephone Encounter (Signed)
Patient called and is requesting pain meds and to speak with Dr. Idelle Leech assistant.   Please call [phone]

## 2020-04-20 NOTE — Telephone Encounter (Signed)
Patient advised and voiced understanding.  

## 2020-04-20 NOTE — Telephone Encounter (Signed)
Patient is in pain with foley catheter still in place and would like pain medication for relief. [place] nurse is coming tomorrow to follow up.   Please advise, thanks.

## 2020-04-22 ENCOUNTER — Other Ambulatory Visit: Payer: Self-pay

## 2020-04-22 ENCOUNTER — Other Ambulatory Visit: Payer: Self-pay | Admitting: Family Medicine

## 2020-04-22 DIAGNOSIS — J449 Chronic obstructive pulmonary disease, unspecified: Secondary | ICD-10-CM | POA: Diagnosis not present

## 2020-04-22 DIAGNOSIS — F329 Major depressive disorder, single episode, unspecified: Secondary | ICD-10-CM | POA: Diagnosis not present

## 2020-04-22 DIAGNOSIS — F17210 Nicotine dependence, cigarettes, uncomplicated: Secondary | ICD-10-CM | POA: Diagnosis not present

## 2020-04-22 DIAGNOSIS — L89513 Pressure ulcer of right ankle, stage 3: Secondary | ICD-10-CM | POA: Diagnosis not present

## 2020-04-22 DIAGNOSIS — F411 Generalized anxiety disorder: Secondary | ICD-10-CM | POA: Diagnosis not present

## 2020-04-22 DIAGNOSIS — M1991 Primary osteoarthritis, unspecified site: Secondary | ICD-10-CM | POA: Diagnosis not present

## 2020-04-22 DIAGNOSIS — E538 Deficiency of other specified B group vitamins: Secondary | ICD-10-CM | POA: Diagnosis not present

## 2020-04-22 DIAGNOSIS — I482 Chronic atrial fibrillation, unspecified: Secondary | ICD-10-CM | POA: Diagnosis not present

## 2020-04-22 DIAGNOSIS — I10 Essential (primary) hypertension: Secondary | ICD-10-CM | POA: Diagnosis not present

## 2020-04-24 DIAGNOSIS — E538 Deficiency of other specified B group vitamins: Secondary | ICD-10-CM | POA: Diagnosis not present

## 2020-04-24 DIAGNOSIS — F329 Major depressive disorder, single episode, unspecified: Secondary | ICD-10-CM | POA: Diagnosis not present

## 2020-04-24 DIAGNOSIS — I482 Chronic atrial fibrillation, unspecified: Secondary | ICD-10-CM | POA: Diagnosis not present

## 2020-04-24 DIAGNOSIS — F17210 Nicotine dependence, cigarettes, uncomplicated: Secondary | ICD-10-CM | POA: Diagnosis not present

## 2020-04-24 DIAGNOSIS — F411 Generalized anxiety disorder: Secondary | ICD-10-CM | POA: Diagnosis not present

## 2020-04-24 DIAGNOSIS — I10 Essential (primary) hypertension: Secondary | ICD-10-CM | POA: Diagnosis not present

## 2020-04-24 DIAGNOSIS — J449 Chronic obstructive pulmonary disease, unspecified: Secondary | ICD-10-CM | POA: Diagnosis not present

## 2020-04-24 DIAGNOSIS — M1991 Primary osteoarthritis, unspecified site: Secondary | ICD-10-CM | POA: Diagnosis not present

## 2020-04-24 DIAGNOSIS — L89513 Pressure ulcer of right ankle, stage 3: Secondary | ICD-10-CM | POA: Diagnosis not present

## 2020-04-25 ENCOUNTER — Telehealth: Payer: Self-pay

## 2020-04-25 NOTE — Telephone Encounter (Signed)
Noted  

## 2020-04-25 NOTE — Telephone Encounter (Signed)
Sent as Juluis Rainier,  PT assistant, Tom called to report 2 missed visits. Patient declined once and caregiver refused visit on Friday 7/23.

## 2020-04-26 ENCOUNTER — Telehealth: Payer: Self-pay

## 2020-04-26 ENCOUNTER — Ambulatory Visit (INDEPENDENT_AMBULATORY_CARE_PROVIDER_SITE_OTHER): Payer: Medicare HMO | Admitting: Family Medicine

## 2020-04-26 ENCOUNTER — Encounter: Payer: Self-pay | Admitting: Family Medicine

## 2020-04-26 ENCOUNTER — Other Ambulatory Visit: Payer: Self-pay

## 2020-04-26 VITALS — BP 124/86 | HR 81 | Temp 97.7°F | Resp 16 | Ht 69.0 in | Wt 177.6 lb

## 2020-04-26 DIAGNOSIS — E538 Deficiency of other specified B group vitamins: Secondary | ICD-10-CM | POA: Diagnosis not present

## 2020-04-26 DIAGNOSIS — G9341 Metabolic encephalopathy: Secondary | ICD-10-CM

## 2020-04-26 DIAGNOSIS — E871 Hypo-osmolality and hyponatremia: Secondary | ICD-10-CM | POA: Diagnosis not present

## 2020-04-26 DIAGNOSIS — N179 Acute kidney failure, unspecified: Secondary | ICD-10-CM | POA: Diagnosis not present

## 2020-04-26 DIAGNOSIS — F1010 Alcohol abuse, uncomplicated: Secondary | ICD-10-CM

## 2020-04-26 DIAGNOSIS — J449 Chronic obstructive pulmonary disease, unspecified: Secondary | ICD-10-CM | POA: Diagnosis not present

## 2020-04-26 DIAGNOSIS — I480 Paroxysmal atrial fibrillation: Secondary | ICD-10-CM

## 2020-04-26 DIAGNOSIS — Z7901 Long term (current) use of anticoagulants: Secondary | ICD-10-CM

## 2020-04-26 DIAGNOSIS — R338 Other retention of urine: Secondary | ICD-10-CM | POA: Diagnosis not present

## 2020-04-26 DIAGNOSIS — I4891 Unspecified atrial fibrillation: Secondary | ICD-10-CM | POA: Diagnosis not present

## 2020-04-26 LAB — BASIC METABOLIC PANEL
BUN: 9 mg/dL (ref 6–23)
CO2: 26 mEq/L (ref 19–32)
Calcium: 9 mg/dL (ref 8.4–10.5)
Chloride: 102 mEq/L (ref 96–112)
Creatinine, Ser: 0.93 mg/dL (ref 0.40–1.50)
GFR: 80.87 mL/min (ref >60.00)
Glucose, Bld: 84 mg/dL (ref 70–99)
Potassium: 4.5 mEq/L (ref 3.5–5.1)
Sodium: 137 mEq/L (ref 135–145)

## 2020-04-26 LAB — CBC
HCT: 39.5 % (ref 39.0–52.0)
Hemoglobin: 13.2 g/dL (ref 13.0–17.0)
MCHC: 33.3 g/dL (ref 30.0–36.0)
MCV: 95.7 fl (ref 78.0–100.0)
Platelets: 256 10*3/uL (ref 150.0–400.0)
RBC: 4.13 Mil/uL — ABNORMAL LOW (ref 4.22–5.81)
RDW: 13.7 % (ref 11.5–15.5)
WBC: 8.7 10*3/uL (ref 4.0–10.5)

## 2020-04-26 MED ORDER — UMECLIDINIUM-VILANTEROL 62.5-25 MCG/INH IN AEPB: 1.0000 | INHALATION_SPRAY | Freq: Every day | RESPIRATORY_TRACT | 6 refills | Status: DC

## 2020-04-26 MED ORDER — BUDESONIDE 0.5 MG/2ML IN SUSP: 0.5000 mg | Freq: Every day | RESPIRATORY_TRACT | 11 refills | Status: DC

## 2020-04-26 MED ORDER — CYANOCOBALAMIN 1000 MCG/ML IJ SOLN
1000.0000 ug | Freq: Once | INTRAMUSCULAR | Status: AC
  Administered 2020-04-26: 1000 ug via INTRAMUSCULAR

## 2020-04-26 NOTE — Telephone Encounter (Signed)
PA denied for medication coverage, not on formulary. Any other suggestions?
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
## 2020-04-26 NOTE — Telephone Encounter (Signed)
PA sent via covermymed on 04/01/20   Key: [id number]    Medication: Celedonio Miyamoto 62.5-25mcg/inhaler   Dx: J44.9, COPD   Per Dr. Anitra Lauth pt has tried and failed N/A   Waiting for response.

## 2020-04-26 NOTE — Telephone Encounter (Signed)
Patient notified of med change due to insurance denial. Nothing further needed.

## 2020-04-26 NOTE — Telephone Encounter (Signed)
OK. Pulmicort nebulizer once a day eRxd

## 2020-04-28 DIAGNOSIS — I10 Essential (primary) hypertension: Secondary | ICD-10-CM | POA: Diagnosis not present

## 2020-04-28 DIAGNOSIS — F329 Major depressive disorder, single episode, unspecified: Secondary | ICD-10-CM | POA: Diagnosis not present

## 2020-04-28 DIAGNOSIS — F17210 Nicotine dependence, cigarettes, uncomplicated: Secondary | ICD-10-CM | POA: Diagnosis not present

## 2020-04-28 DIAGNOSIS — F411 Generalized anxiety disorder: Secondary | ICD-10-CM | POA: Diagnosis not present

## 2020-04-28 DIAGNOSIS — J449 Chronic obstructive pulmonary disease, unspecified: Secondary | ICD-10-CM | POA: Diagnosis not present

## 2020-04-28 DIAGNOSIS — M1991 Primary osteoarthritis, unspecified site: Secondary | ICD-10-CM | POA: Diagnosis not present

## 2020-04-28 DIAGNOSIS — E538 Deficiency of other specified B group vitamins: Secondary | ICD-10-CM | POA: Diagnosis not present

## 2020-04-28 DIAGNOSIS — L89513 Pressure ulcer of right ankle, stage 3: Secondary | ICD-10-CM | POA: Diagnosis not present

## 2020-04-28 DIAGNOSIS — I482 Chronic atrial fibrillation, unspecified: Secondary | ICD-10-CM | POA: Diagnosis not present

## 2020-04-29 DIAGNOSIS — J449 Chronic obstructive pulmonary disease, unspecified: Secondary | ICD-10-CM | POA: Diagnosis not present

## 2020-04-29 DIAGNOSIS — I482 Chronic atrial fibrillation, unspecified: Secondary | ICD-10-CM | POA: Diagnosis not present

## 2020-04-29 DIAGNOSIS — F329 Major depressive disorder, single episode, unspecified: Secondary | ICD-10-CM | POA: Diagnosis not present

## 2020-04-29 DIAGNOSIS — F411 Generalized anxiety disorder: Secondary | ICD-10-CM | POA: Diagnosis not present

## 2020-04-29 DIAGNOSIS — L89513 Pressure ulcer of right ankle, stage 3: Secondary | ICD-10-CM | POA: Diagnosis not present

## 2020-04-29 DIAGNOSIS — F17210 Nicotine dependence, cigarettes, uncomplicated: Secondary | ICD-10-CM | POA: Diagnosis not present

## 2020-04-29 DIAGNOSIS — E538 Deficiency of other specified B group vitamins: Secondary | ICD-10-CM | POA: Diagnosis not present

## 2020-04-29 DIAGNOSIS — M1991 Primary osteoarthritis, unspecified site: Secondary | ICD-10-CM | POA: Diagnosis not present

## 2020-04-29 DIAGNOSIS — I10 Essential (primary) hypertension: Secondary | ICD-10-CM | POA: Diagnosis not present

## 2020-05-02 DIAGNOSIS — L89513 Pressure ulcer of right ankle, stage 3: Secondary | ICD-10-CM | POA: Diagnosis not present

## 2020-05-02 DIAGNOSIS — F411 Generalized anxiety disorder: Secondary | ICD-10-CM | POA: Diagnosis not present

## 2020-05-02 DIAGNOSIS — F329 Major depressive disorder, single episode, unspecified: Secondary | ICD-10-CM | POA: Diagnosis not present

## 2020-05-02 DIAGNOSIS — J449 Chronic obstructive pulmonary disease, unspecified: Secondary | ICD-10-CM | POA: Diagnosis not present

## 2020-05-02 DIAGNOSIS — E538 Deficiency of other specified B group vitamins: Secondary | ICD-10-CM | POA: Diagnosis not present

## 2020-05-02 DIAGNOSIS — F17210 Nicotine dependence, cigarettes, uncomplicated: Secondary | ICD-10-CM | POA: Diagnosis not present

## 2020-05-02 DIAGNOSIS — I10 Essential (primary) hypertension: Secondary | ICD-10-CM | POA: Diagnosis not present

## 2020-05-02 DIAGNOSIS — I482 Chronic atrial fibrillation, unspecified: Secondary | ICD-10-CM | POA: Diagnosis not present

## 2020-05-02 DIAGNOSIS — M1991 Primary osteoarthritis, unspecified site: Secondary | ICD-10-CM | POA: Diagnosis not present

## 2020-05-06 ENCOUNTER — Telehealth: Payer: Self-pay

## 2020-05-06 MED ORDER — CLOTRIMAZOLE-BETAMETHASONE 1-0.05 % EX CREA: 1.0000 "application " | TOPICAL_CREAM | Freq: Two times a day (BID) | CUTANEOUS | 0 refills | Status: DC

## 2020-05-06 NOTE — Telephone Encounter (Signed)
Signed and put in box to go up front. Signed:  Phil Ibeth Fahmy, MD           05/06/2020  

## 2020-05-06 NOTE — Telephone Encounter (Signed)
Patient called to say he thinks he may a urinary track infection. He reports his urine has a really bad smell to it.  Please advise. He can be reached [phone].
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
## 2020-05-06 NOTE — Telephone Encounter (Signed)
Topical med eRx'd

## 2020-05-06 NOTE — Telephone Encounter (Signed)
Home health orders received 05/06/20 for [facility] initiation orders: Yes.  Home health re-certification orders: No. Patient last seen by ordering physician for this condition: 04/26/20. Must be less than 90 days for re-certification and less than 30 days prior for initiation. Visit must have been for the condition the orders are being placed.  Patient meets criteria for Physician to sign orders: Yes.        Current med list has been attached: Yes        Orders placed on physicians desk for signature: 05/06/20 (date) If patient does not meet criteria for orders to be signed: pt was called to schedule appt. Appt is scheduled for n/a.   Emilee Hero

## 2020-05-06 NOTE — Telephone Encounter (Signed)
Patient was advised cream sent in.

## 2020-05-09 ENCOUNTER — Other Ambulatory Visit: Payer: Self-pay

## 2020-05-09 ENCOUNTER — Encounter: Payer: Self-pay | Admitting: Urology

## 2020-05-09 ENCOUNTER — Ambulatory Visit (INDEPENDENT_AMBULATORY_CARE_PROVIDER_SITE_OTHER): Payer: Medicare HMO | Admitting: Urology

## 2020-05-09 VITALS — BP 116/78 | HR 75 | Temp 97.7°F | Ht 69.0 in | Wt 177.6 lb

## 2020-05-09 DIAGNOSIS — N138 Other obstructive and reflux uropathy: Secondary | ICD-10-CM | POA: Diagnosis not present

## 2020-05-09 DIAGNOSIS — R339 Retention of urine, unspecified: Secondary | ICD-10-CM

## 2020-05-09 DIAGNOSIS — N401 Enlarged prostate with lower urinary tract symptoms: Secondary | ICD-10-CM
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
## 2020-05-09 NOTE — Progress Notes (Signed)
Urological Symptom Review  Patient is experiencing the following symptoms: none   Review of Systems  Gastrointestinal (upper)  : Negative for upper GI symptoms  Gastrointestinal (lower) : Negative for lower GI symptoms  Constitutional : Fatigue  Skin: Negative for skin symptoms  Eyes: Negative for eye symptoms  Ear/Nose/Throat : Negative for Ear/Nose/Throat symptoms  Hematologic/Lymphatic: Negative for Hematologic/Lymphatic symptoms  Cardiovascular : Negative for cardiovascular symptoms  Respiratory : Shortness of breath  Endocrine: Negative for endocrine symptoms  Musculoskeletal: Negative  Neurological: Negative for neurological symptoms  Psychologic: Negative for psychiatric symptoms

## 2020-05-09 NOTE — Progress Notes (Signed)
Cath Change/ Replacement  Patient is present today for a catheter change due to urinary retention.  62ml of water was removed from the balloon, a 16FR foley cath was removed with out difficulty.  Patient was cleaned and prepped in a sterile fashion with betadine. A 16 FR foley cath was replaced into the bladder no complications were noted Urine return was noted 3ml and urine was yellow in color. The balloon was filled with 66ml of sterile water. A bedside bag was attached for drainage.  A night bag was also given to the patient and patient was given instruction on how to change from one bag to another. Patient was given proper instruction on catheter care.    Performed by: Gibson Ramp RN  Follow up: after urodynamic

## 2020-05-09 NOTE — Patient Instructions (Signed)
Urodynamic Testing What is urodynamic testing?  Urodynamic testing is a set of tests and X-rays. These tests help to find out how well your bladder and the part of your body that drains pee from the bladder (urethra) are working. Why do I need this testing? You may need these tests if you:  Are leaking pee (urine).  Have trouble starting or stopping peeing (urination).  Pee often or it hurts to pee.  Have urinary tract infections often.  Are not able to empty your bladder.  Have strong urges to pee.  Have a weak flow of pee. How do I prepare for the tests?  Ask your doctor about: ? Changing or stopping your normal medicines. This is important if you take diabetes medicines or blood thinners. ? Whether you should arrive for the test having to pee.  Tell your doctor about: ? Any allergies you have. ? All medicines you are taking, including vitamins, herbs, eye drops, creams, and over-the-counter medicines. ? Whether you are pregnant or may be pregnant. What are the risks? In general, these tests are safe. But some of the tests have risks. These may include:  Discomfort.  Feeling a need to pee often.  Bleeding.  Infection.  Allergic reactions to medicines or dyes. How are the tests done? The tests may be done in one visit or may be done over a few visits. You may be given an antibiotic medicine to help keep you from getting an infection. The types of tests done may include: Uroflowmetry This test measures how much you pee and how long it takes. You will pee into a type of toilet or device that sends measurements to a computer. Postvoid residual measurement This test measures how much pee is left in your bladder after you pee. It may be done by:  Using sound waves and a computer to create pictures of your bladder (ultrasound).  Putting a thin, flexible tube (catheter) into your bladder to take out the pee that is left so it can be measured. Cystometric testing This  test measures how much pressure there is in your bladder before you pee and as you pee.  You may be given a medicine to numb the area (local anesthetic).  The area around the opening of your urethra will be cleaned.  A thin, flexible tube will be used to empty your bladder.  A flexible tube that can measure pressure will then be placed. Your bladder will be filled with germ-free water.  Pressure will be measured: ? As your bladder fills. ? When you feel the need to pee. ? As your bladder is emptied.  In some cases, your bladder may be filled with a dye that shows up on X-rays (contrast material) so that X-ray pictures can be taken during the test. Electromyogram This test measures the activity of the nerves and muscles in your bladder and in the tube that empties your bladder. Sticky patches (electrodes) will be placed on your body to measure electrical activity. What happens after the testing?  You should be able to go home right away.  You can do your usual activities.  You may be told to drink a glass of water every 30 minutes. Do this for the first 2 hours you are home.  Taking a warm bath or using a warm, wet towel may relieve any discomfort. Let your doctor know if you have:  Pain.  Blood in your pee.  Chills.  Fever. What do my test results mean? Talk   with your doctor about what your results mean. These test results can help your doctor find out how well your bladder and the tube that empties your bladder are working. Your results and your symptoms can help your doctor find what might be causing your problems. Questions to ask your doctor Ask your doctor, or the department that is doing the test:  When will my results be ready?  How will I get my results?  What are my treatment options?  What other tests do I need?  What are my next steps? Summary  Urodynamic testing is a set of tests and X-rays.  These tests help to find out how well your bladder and the  part of your body that drains pee from the bladder are working.  Your results and your symptoms can help your doctor find what might be causing your problems.  Talk with your doctor about what your results mean. This information is not intended to replace advice given to you by your health care provider. Make sure you discuss any questions you have with your health care provider. Document Revised: 01/06/2019 Document Reviewed: 07/22/2017 Elsevier Patient Education  2020 Elsevier Inc.  

## 2020-05-11 DIAGNOSIS — J449 Chronic obstructive pulmonary disease, unspecified: Secondary | ICD-10-CM | POA: Diagnosis not present

## 2020-05-11 DIAGNOSIS — F411 Generalized anxiety disorder: Secondary | ICD-10-CM | POA: Diagnosis not present

## 2020-05-11 DIAGNOSIS — I482 Chronic atrial fibrillation, unspecified: Secondary | ICD-10-CM | POA: Diagnosis not present

## 2020-05-11 DIAGNOSIS — F329 Major depressive disorder, single episode, unspecified: Secondary | ICD-10-CM | POA: Diagnosis not present

## 2020-05-11 DIAGNOSIS — I10 Essential (primary) hypertension: Secondary | ICD-10-CM | POA: Diagnosis not present

## 2020-05-11 DIAGNOSIS — M1991 Primary osteoarthritis, unspecified site: Secondary | ICD-10-CM | POA: Diagnosis not present

## 2020-05-11 DIAGNOSIS — F17210 Nicotine dependence, cigarettes, uncomplicated: Secondary | ICD-10-CM | POA: Diagnosis not present

## 2020-05-11 DIAGNOSIS — L89513 Pressure ulcer of right ankle, stage 3: Secondary | ICD-10-CM | POA: Diagnosis not present

## 2020-05-11 DIAGNOSIS — E538 Deficiency of other specified B group vitamins: Secondary | ICD-10-CM | POA: Diagnosis not present

## 2020-05-23 DIAGNOSIS — R338 Other retention of urine: Secondary | ICD-10-CM | POA: Diagnosis not present

## 2020-05-27 DIAGNOSIS — I10 Essential (primary) hypertension: Secondary | ICD-10-CM | POA: Diagnosis not present

## 2020-05-27 DIAGNOSIS — F329 Major depressive disorder, single episode, unspecified: Secondary | ICD-10-CM | POA: Diagnosis not present

## 2020-05-27 DIAGNOSIS — I482 Chronic atrial fibrillation, unspecified: Secondary | ICD-10-CM | POA: Diagnosis not present

## 2020-05-27 DIAGNOSIS — M1991 Primary osteoarthritis, unspecified site: Secondary | ICD-10-CM | POA: Diagnosis not present

## 2020-05-27 DIAGNOSIS — F17210 Nicotine dependence, cigarettes, uncomplicated: Secondary | ICD-10-CM | POA: Diagnosis not present

## 2020-05-27 DIAGNOSIS — E538 Deficiency of other specified B group vitamins: Secondary | ICD-10-CM | POA: Diagnosis not present

## 2020-05-27 DIAGNOSIS — L89513 Pressure ulcer of right ankle, stage 3: Secondary | ICD-10-CM | POA: Diagnosis not present

## 2020-05-27 DIAGNOSIS — F411 Generalized anxiety disorder: Secondary | ICD-10-CM | POA: Diagnosis not present

## 2020-05-27 DIAGNOSIS — J449 Chronic obstructive pulmonary disease, unspecified: Secondary | ICD-10-CM | POA: Diagnosis not present

## 2020-06-09 ENCOUNTER — Ambulatory Visit (INDEPENDENT_AMBULATORY_CARE_PROVIDER_SITE_OTHER): Payer: Medicare HMO | Admitting: Urology

## 2020-06-09 ENCOUNTER — Other Ambulatory Visit: Payer: Self-pay

## 2020-06-09 ENCOUNTER — Encounter: Payer: Self-pay | Admitting: Urology

## 2020-06-09 VITALS — Temp 98.4°F | Ht 69.0 in | Wt 170.0 lb

## 2020-06-09 DIAGNOSIS — R339 Retention of urine, unspecified: Secondary | ICD-10-CM

## 2020-06-09 DIAGNOSIS — N138 Other obstructive and reflux uropathy: Secondary | ICD-10-CM | POA: Diagnosis not present

## 2020-06-09 DIAGNOSIS — N401 Enlarged prostate with lower urinary tract symptoms: Secondary | ICD-10-CM | POA: Diagnosis not present

## 2020-06-09 MED ORDER — CIPROFLOXACIN HCL 500 MG PO TABS: 500.0000 mg | ORAL_TABLET | Freq: Once | ORAL | Status: DC
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
## 2020-06-09 NOTE — Patient Instructions (Signed)
Transurethral Resection of the Prostate Transurethral resection of the prostate (TURP) is the removal (resection) of part of the gland that produces semen (prostate gland). This procedure is done to treat benign prostatic hyperplasia (BPH). BPH is an abnormal, noncancerous (benign) increase in the number of cells that make up the prostate tissue. BPH causes the prostate to get bigger. The enlarged prostate can push against or block the tube that drains urine from the bladder out of the body (urethra). BPH can affect normal urine flow by causing bladder infections, difficulty controlling bladder function, and difficulty emptying the bladder.  The goal of TURP is to remove enough prostate tissue to allow for a normal flow of urine. The procedure will allow you to empty your bladder more completely when you urinate so that you can urinate less often. In a transurethral resection, a thin telescope with a light, a tiny camera, and an electric cutting edge (resectoscope) is passed through the urethra and into the prostate. The opening of the urethra is at the end of the penis. Tell a health care provider about:  Any allergies you have.  All medicines you are taking, including vitamins, herbs, eye drops, creams, and over-the-counter medicines.  Any problems you or family members have had with anesthetic medicines.  Any blood disorders you have.  Any surgeries you have had.  Any medical conditions you have.  Any prostate infections you have had. What are the risks? Generally, this is a safe procedure. However, problems may occur, including:  Infection.  Bleeding.  Allergic reactions to medicines.  Damage to other structures or organs, such as: ? The urethra. ? The bladder. ? Muscles that surround the prostate.  Difficulty getting an erection.  Inability to control when you urinate (incontinence).  Scarring, which may cause problems with urine flow. What happens before the  procedure? Medicines Ask your health care provider about:  Changing or stopping your regular medicines. This is especially important if you are taking diabetes medicines or blood thinners.  Taking medicines such as aspirin and ibuprofen. These medicines can thin your blood. Do not take these medicines unless your health care provider tells you to take them.  Taking over-the-counter medicines, vitamins, herbs, and supplements. Eating and drinking Follow instructions from your health care provider about eating and drinking, which may include:  8 hours before the procedure - stop eating heavy meals or foods, such as meat, fried foods, or fatty foods.  6 hours before the procedure - stop eating light meals or foods, such as toast or cereal.  6 hours before the procedure - stop drinking milk or drinks that contain milk.  2 hours before the procedure - stop drinking clear liquids. Staying hydrated Follow instructions from your health care provider about hydration, which may include:  Up to 2 hours before the procedure - you may continue to drink clear liquids, such as water, clear fruit juice, black coffee, and plain tea. General instructions  You may have a physical exam.  You may have a blood or urine sample taken.  Ask your health care provider what steps will be taken to help prevent infection. These may include: ? Washing skin with a germ-killing soap. ? Taking antibiotic medicine.  Plan to have someone take you home from the hospital or clinic. You may not be able to drive for up to 10 days after your procedure.  Plan to have a responsible adult care for you for at least 24 hours after you leave the hospital or   clinic. This is important. What happens during the procedure?   An IV will be inserted into one of your veins.  You will be given one or more of the following: ? A medicine to help you relax (sedative). ? A medicine to make you fall asleep (general anesthetic). ? A  medicine that is injected into your spine to numb the area below and slightly above the injection site (spinal anesthetic).  Your legs will be placed in foot rests (stirrups) so that your legs are apart and your knees are bent.  The resectoscope will be passed through your urethra to your prostate.  Parts of your prostate will be resected using the cutting edge of the resectoscope.  The resectoscope will be removed.  A small, thin tube (catheter) will be passed through your urethra and into your bladder. The catheter will drain urine into a bag outside of your body. ? Fluid may be passed through the catheter to keep the catheter open. The procedure may vary among health care providers and hospitals. What happens after the procedure?  Your blood pressure, heart rate, breathing rate, and blood oxygen level will be monitored until you leave the hospital or clinic.  You may continue to receive fluids and medicines through an IV.  You may have some pain. Pain medicine will be available to help you.  You will have a catheter draining your urine. ? You may have blood in your urine. Your catheter may be kept in until your urine is clear. ? Your urinary drainage will be monitored. If necessary, your bladder may be rinsed out (irrigated) through your catheter.  You will be encouraged to walk around as soon as possible.  You may have to wear compression stockings. These stockings help prevent blood clots and reduce swelling in your legs.  Do not drive for 24 hours if you were given a sedative during your procedure. Summary  Transurethral resection of the prostate (TURP) is the removal (resection) of part of the gland that produces semen (prostate gland).  The goal of this procedure is to remove enough prostate tissue to allow for a normal flow of urine.  Follow instructions from your health care provider about taking medicines and about eating and drinking before the procedure. This  information is not intended to replace advice given to you by your health care provider. Make sure you discuss any questions you have with your health care provider. Document Revised: 01/07/2019 Document Reviewed: 06/18/2018 Elsevier Patient Education  2020 Elsevier Inc.  

## 2020-06-09 NOTE — Progress Notes (Signed)
Cath Change/ Replacement  Patient is present today for a catheter change due to urinary retention.  36ml of water was removed from the balloon, a 16FR foley cath was removed with out difficulty.  Pt was then prepped for cystoscopy. After cysto patient was cleaned and prepped in a sterile fashion with betadine. A 16 FR foley cath was replaced into the bladder no complications were noted Urine return was noted 100 ml and urine was light yellow in color. The balloon was filled with 80ml of sterile water. A leg bag was attached for drainage.  Patient was given proper instruction on catheter care.    Performed by: Rashaan Wyles, LPN  Follow up: 1 month NV

## 2020-06-09 NOTE — Progress Notes (Signed)
Unable to obtain vital d/t pts inability to sit still.     Urological Symptom Review  Patient is experiencing the following symptoms: None   Review of Systems  Gastrointestinal (upper)  : Negative for upper GI symptoms  Gastrointestinal (lower) : Negative for lower GI symptoms  Constitutional : Negative for symptoms  Skin: Negative for skin symptoms  Eyes: Negative for eye symptoms  Ear/Nose/Throat : Negative for Ear/Nose/Throat symptoms  Hematologic/Lymphatic: Negative for Hematologic/Lymphatic symptoms  Cardiovascular : Negative for cardiovascular symptoms  Respiratory : Negative for respiratory symptoms  Endocrine: Negative for endocrine symptoms  Musculoskeletal: Negative for musculoskeletal symptoms  Neurological: Negative for neurological symptoms  Psychologic: Negative for psychiatric symptoms

## 2020-06-13 ENCOUNTER — Encounter: Payer: Self-pay | Admitting: Urology

## 2020-06-13 ENCOUNTER — Telehealth: Payer: Self-pay

## 2020-06-13 DIAGNOSIS — R2681 Unsteadiness on feet: Secondary | ICD-10-CM

## 2020-06-13 DIAGNOSIS — R2689 Other abnormalities of gait and mobility: Secondary | ICD-10-CM

## 2020-06-13 NOTE — Telephone Encounter (Signed)
Patient requesting shower chair.  He said it is very hard for him to bath himself and hard for him to stand with his bag in the shower with him.   Patient can be reached for any questions [phone].  Please send prescription for shower chair to  [facility]

## 2020-06-13 NOTE — Telephone Encounter (Signed)
Ok for this? 

## 2020-06-13 NOTE — Telephone Encounter (Signed)
OK with this, but as usual it may require a face to face visit before medicare will authorize.Marland KitchenMarland Kitchen

## 2020-06-13 NOTE — Telephone Encounter (Signed)
Appt scheduled for Monday, 9/20. Can do written/electronic prescription at that time.

## 2020-06-14 ENCOUNTER — Other Ambulatory Visit: Payer: Self-pay | Admitting: Family Medicine

## 2020-06-20 ENCOUNTER — Ambulatory Visit: Payer: Medicare HMO | Admitting: Family Medicine

## 2020-06-24 ENCOUNTER — Ambulatory Visit: Payer: Medicare HMO | Admitting: Family Medicine

## 2020-06-24 ENCOUNTER — Other Ambulatory Visit: Payer: Self-pay

## 2020-06-24 DIAGNOSIS — R2681 Unsteadiness on feet: Secondary | ICD-10-CM | POA: Insufficient documentation

## 2020-06-24 DIAGNOSIS — R2689 Other abnormalities of gait and mobility: Secondary | ICD-10-CM

## 2020-06-24 DIAGNOSIS — Z7409 Other reduced mobility: Secondary | ICD-10-CM

## 2020-06-24 HISTORY — DX: Unsteadiness on feet: R26.81

## 2020-06-24 HISTORY — DX: Other abnormalities of gait and mobility: R26.89

## 2020-06-24 NOTE — Telephone Encounter (Addendum)
Rx submitted electronically for shower chair but spoke with patient and his preference was [employer]. Can you do a written prescription? I will fax it to them. He does not need an appointment for this, I checked with DME company already. Will inform patient

## 2020-06-24 NOTE — Telephone Encounter (Signed)
Rx faxed but did not go thru. Will try 1 more time and try again on Monday

## 2020-06-24 NOTE — Telephone Encounter (Signed)
Rx written and handed to you
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
## 2020-06-24 NOTE — Telephone Encounter (Signed)
Calling back to check status on shower chair. I told patient he would need an appt.  Patient states that he cant get here, well he can but he is going to smell. Patient states that he is not had a shower/bath in over 7 days. He doesn't know what to do anymore, he thinks he is dying.  States the smell is pretty bad.  He requested for [facility], Dr. Idelle Leech nurse call him back today if possible.  My question: Can home health places assist patient with showering?   Please call [phone]  Thank you, Hinton Dyer

## 2020-06-27 ENCOUNTER — Other Ambulatory Visit: Payer: Self-pay | Admitting: Family Medicine

## 2020-06-27 NOTE — Telephone Encounter (Signed)
Fax completed and confirmation received. Original Rx on my desk if needed.

## 2020-06-30 ENCOUNTER — Ambulatory Visit: Payer: Medicare HMO | Admitting: Family Medicine

## 2020-06-30 ENCOUNTER — Ambulatory Visit (INDEPENDENT_AMBULATORY_CARE_PROVIDER_SITE_OTHER): Payer: Medicare HMO

## 2020-06-30 ENCOUNTER — Other Ambulatory Visit: Payer: Self-pay

## 2020-06-30 DIAGNOSIS — Z23 Encounter for immunization: Secondary | ICD-10-CM | POA: Diagnosis not present

## 2020-06-30 DIAGNOSIS — E538 Deficiency of other specified B group vitamins: Secondary | ICD-10-CM

## 2020-06-30 MED ORDER — CYANOCOBALAMIN 1000 MCG/ML IJ SOLN
1000.0000 ug | Freq: Once | INTRAMUSCULAR | Status: AC
  Administered 2020-06-30: 1000 ug via INTRAMUSCULAR

## 2020-06-30 NOTE — Progress Notes (Signed)
Jacob Frank is a 69 y.o. male presents to the office today for b12 injections, per physician's orders. Original order:   Will give B12 1000 mcg IM today: he averages this inj about q3-4 mo b/c of his transportation probs (unable to come in for biweekly or monthly injections).  cyanocobalamin, 1000 mg,  IM was administered in the left deltoid today. Patient tolerated injection. Patient due for follow up labs/provider appt: No. Date due: n/a, appt made No Patient next injection due: n/a, appt made No  Octaviano Glow

## 2020-07-07 ENCOUNTER — Other Ambulatory Visit: Payer: Self-pay | Admitting: Family Medicine

## 2020-07-08 ENCOUNTER — Other Ambulatory Visit: Payer: Self-pay

## 2020-07-08 ENCOUNTER — Ambulatory Visit (INDEPENDENT_AMBULATORY_CARE_PROVIDER_SITE_OTHER): Payer: Medicare HMO

## 2020-07-08 DIAGNOSIS — R339 Retention of urine, unspecified: Secondary | ICD-10-CM | POA: Diagnosis not present

## 2020-07-08 NOTE — Progress Notes (Signed)
Cath Change/ Replacement  Patient is present today for a catheter change due to urinary retention.  10ml of water was removed from the balloon, a 16FR foley cath was removed with out difficulty.  Patient was cleaned and prepped in a sterile fashion with betadine. A 16 FR foley cath was replaced into the bladder no complications were noted Urine return was noted 20ml and urine was yellow in color. The balloon was filled with 10ml of sterile water. A leg bag was attached for drainage.  Patient was given proper instruction on catheter care.    Performed by: Rihanna Marseille,LPN  Follow up: 1 month 

## 2020-07-21 ENCOUNTER — Telehealth: Payer: Self-pay

## 2020-07-21 NOTE — Telephone Encounter (Signed)
Pt called wanting a leg bag. Pt not expected to keep cath long term. Leg bag was put up front for pt to pick up.

## 2020-07-28 ENCOUNTER — Other Ambulatory Visit: Payer: Self-pay | Admitting: Family Medicine

## 2020-07-28 ENCOUNTER — Telehealth: Payer: Self-pay

## 2020-07-28 NOTE — Telephone Encounter (Signed)
Patient has rash on both legs, requesting cream or ointment.  Please contact patient [phone]

## 2020-07-28 NOTE — Telephone Encounter (Signed)
Spoke with patient and informed he would need an appointment for evaluation prior to any type of cream or ointment. I offered soonest appt for Monday, 11/1 @ 11am. He explained he could hardly walk and is in pain. I explained I understood but an evaluation would be needed for the rash. He stated "I will find another doctor then this is ridiculous. F** this." I asked if he was talking to himself or still talking to me. He said "no I'll talk to you later" I started to tell him foul language is not condoned but by then he had already hung up.

## 2020-07-28 NOTE — Telephone Encounter (Signed)
Noted  

## 2020-07-29 NOTE — Telephone Encounter (Signed)
Patient called to schedule appt with Dr. Anitra Lauth  Scheduled appt for 11/1 at 11AM

## 2020-08-01 ENCOUNTER — Ambulatory Visit: Payer: Medicare HMO | Admitting: Family Medicine

## 2020-08-02 ENCOUNTER — Other Ambulatory Visit: Payer: Self-pay | Admitting: Family Medicine

## 2020-08-05 ENCOUNTER — Encounter: Payer: Self-pay | Admitting: Family Medicine

## 2020-08-05 ENCOUNTER — Ambulatory Visit (INDEPENDENT_AMBULATORY_CARE_PROVIDER_SITE_OTHER): Payer: Medicare HMO | Admitting: Family Medicine

## 2020-08-05 ENCOUNTER — Other Ambulatory Visit: Payer: Self-pay

## 2020-08-05 VITALS — BP 122/80 | HR 72 | Temp 98.0°F | Ht 69.0 in | Wt 170.0 lb

## 2020-08-05 DIAGNOSIS — E538 Deficiency of other specified B group vitamins: Secondary | ICD-10-CM | POA: Diagnosis not present

## 2020-08-05 DIAGNOSIS — B356 Tinea cruris: Secondary | ICD-10-CM

## 2020-08-05 MED ORDER — CLOTRIMAZOLE 1 % EX CREA: 1.0000 "application " | TOPICAL_CREAM | Freq: Two times a day (BID) | CUTANEOUS | 5 refills | Status: DC

## 2020-08-05 MED ORDER — CYANOCOBALAMIN 1000 MCG/ML IJ SOLN
1000.0000 ug | Freq: Once | INTRAMUSCULAR | Status: AC
  Administered 2020-08-05: 1000 ug via INTRAMUSCULAR
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
## 2020-08-05 NOTE — Patient Instructions (Signed)
Start the cream twice a day. I gave you a larger amount with refills.  Make sure to continue this cream until rash completely resolved.    Jock Itch  Jock itch is an itchy rash in the groin and upper thigh area. It is a skin infection that is caused by a type of germ that lives in dark, damp places (fungus). The rash usually goes away in 2-3 weeks with treatment. Follow these instructions at home: Skin care  Use skin creams, ointments, or powders exactly as told by your doctor.  Wear loose-fitting clothes. Clothes should not rub against your groin area. Men should wear boxer shorts or loose-fitting underwear.  Keep your groin area clean and dry. ? Change your underwear every day. ? Change out of wet bathing suits as soon as you can. ? After bathing, use a separate towel to dry your groin area. Dry the area gently and completely.  Avoid hot baths and showers. Hot water can make itching worse.  Do not scratch the area. General instructions  Take and apply over-the-counter and prescription medicines only as told by your doctor.  Do not share towels or clothing with other people.  Wash your hands often with soap and water, especially after touching your groin area. If you do not have soap and water, use alcohol-based hand sanitizer. Contact a doctor if:  Your rash: ? Gets worse. ? Does not get better after 2 weeks of treatment. ? Spreads. ? Comes back after treatment is done.  You have any of the following: ? A fever. ? New or worsening redness, swelling, or pain around your rash. ? Fluid, blood, or pus coming from your rash. Summary  Jock itch is an itchy rash. It affects the groin and upper thigh area.  Jock itch usually goes away in 2-3 weeks with treatment.  Keep your groin area clean and dry. This information is not intended to replace advice given to you by your health care provider. Make sure you discuss any questions you have with your health care  provider. Document Revised: 08/30/2017 Document Reviewed: 08/28/2017 Elsevier Patient Education  2020 [employer].

## 2020-08-08 ENCOUNTER — Ambulatory Visit (INDEPENDENT_AMBULATORY_CARE_PROVIDER_SITE_OTHER): Payer: Medicare HMO

## 2020-08-08 ENCOUNTER — Other Ambulatory Visit: Payer: Self-pay

## 2020-08-08 DIAGNOSIS — R339 Retention of urine, unspecified: Secondary | ICD-10-CM

## 2020-08-08 NOTE — Progress Notes (Signed)
Cath Change/ Replacement  Patient is present today for a catheter change due to urinary retention.  67ml of water was removed from the balloon, a 16FR foley cath was removed with out difficulty.  Patient was cleaned and prepped in a sterile fashion with betadine. A 16 FR foley cath was replaced into the bladder no complications were noted Urine return was noted 76ml and urine was yellow in color. The balloon was filled with 63ml of sterile water. A leg bag was attached for drainage.A second leg bag was given to pt. Patient was given proper instruction on catheter care.    Performed by: Antionette Char, Alida Greiner,LPN

## 2020-08-09 ENCOUNTER — Ambulatory Visit (INDEPENDENT_AMBULATORY_CARE_PROVIDER_SITE_OTHER): Payer: Medicare HMO

## 2020-08-09 ENCOUNTER — Other Ambulatory Visit: Payer: Self-pay | Admitting: Family Medicine

## 2020-08-09 ENCOUNTER — Other Ambulatory Visit: Payer: Self-pay | Admitting: Urology

## 2020-08-09 ENCOUNTER — Telehealth: Payer: Self-pay

## 2020-08-09 DIAGNOSIS — R339 Retention of urine, unspecified: Secondary | ICD-10-CM | POA: Diagnosis not present

## 2020-08-09 MED ORDER — TRAMADOL HCL 50 MG PO TABS: 50.0000 mg | ORAL_TABLET | Freq: Four times a day (QID) | ORAL | 0 refills | Status: DC | PRN

## 2020-08-09 NOTE — Telephone Encounter (Signed)
LVM for patient to call back and schedule appt

## 2020-08-09 NOTE — Telephone Encounter (Signed)
Patient requesting something for pain.  Stated he was seen yesterday to do a cath change, and he was told he had an infection and they did not help him.  Please call [phone].

## 2020-08-09 NOTE — Telephone Encounter (Signed)
Patient called back requesting medications for pain and for the infection.

## 2020-08-09 NOTE — Telephone Encounter (Signed)
Pt called and made aware the pain medication was sent in.

## 2020-08-09 NOTE — Telephone Encounter (Signed)
Patient needs appointment. Please assist with scheduling, thanks.

## 2020-08-09 NOTE — Progress Notes (Signed)
Pt came in today to check foley cath placement due to complaints of pain and discharge coming from penis. Foley cath checked and urine yellow in color draining into leg bag. Pt stated that he had just emptied the bag prior to coming to office. No discharge noted coming from penis at time of visit. Pt stated he had just wiped it off. Dried discharge noted in pt pants. Message sent to Dr. Alyson Ingles to advise.

## 2020-08-10 ENCOUNTER — Telehealth: Payer: Self-pay

## 2020-08-10 NOTE — Patient Instructions (Signed)
Jacob Frank  08/10/2020     @PREFPERIOPPHARMACY @   Your procedure is scheduled on  08/15/2020.  Report to [facility] at  0800  A.M.  Call this number if you have problems the morning of surgery:  [phone]   Remember:  Do not eat or drink after midnight.                        Take these medicines the morning of surgery with A SIP OF WATER  Amlodipine, metoprolol, protonix, flomax, tramadol.    Do not wear jewelry, make-up or nail polish.  Do not wear lotions, powders, or perfumes. Please wear deodorant and brush your teeth.  Do not shave 48 hours prior to surgery.  Men may shave face and neck.  Do not bring valuables to the hospital.  [facility] is not responsible for any belongings or valuables.  Contacts, dentures or bridgework may not be worn into surgery.  Leave your suitcase in the car.  After surgery it may be brought to your room.  For patients admitted to the hospital, discharge time will be determined by your treatment team.  Patients discharged the day of surgery will not be allowed to drive home.   Name and phone number of your driver:   family Special instructions:  DO NOT smoke or drink any alcohol the morning of your procedure.  Please read over the following fact sheets that you were given. Anesthesia Post-op Instructions and Care and Recovery After Surgery       Transurethral Resection of the Prostate, Care After This sheet gives you information about how to care for yourself after your procedure. Your health care provider may also give you more specific instructions. If you have problems or questions, contact your health care provider. What can I expect after the procedure? After the procedure, it is common to have:  Mild pain in your lower abdomen.  Soreness or mild discomfort in your penis from having the catheter inserted during the procedure.  A feeling of urgency when you need to urinate.  A small amount of blood in your  urine. You may notice some small blood clots in your urine. These are normal. Follow these instructions at home: Medicines  Take over-the-counter and prescription medicines only as told by your health care provider.  If you were prescribed an antibiotic medicine, take it as told by your health care provider. Do not stop taking the antibiotic even if you start to feel better.  Ask your health care provider if the medicine prescribed to you: ? Requires you to avoid driving or using heavy machinery. ? Can cause constipation. You may need to take actions to prevent or treat constipation, such as:  Take over-the-counter or prescription medicines.  Eat foods that are high in fiber, such as fresh fruits and vegetables, whole grains, and beans.  Limit foods that are high in fat and processed sugars, such as fried or sweet foods.  Do not drive for 24 hours if you were given a sedative during your procedure. Activity   Return to your normal activities as told by your health care provider. Ask your health care provider what activities are safe for you.  Do not lift anything that is heavier than 10 lb (4.5 kg), or the limit that you are told, for 3 weeks after the procedure or until your health care provider says that it is safe.  Avoid intense physical activity for as long as told by your health care provider.  Avoid sitting for a long time without moving. Get up and move around one or more times every few hours. This helps to prevent blood clots. You may increase your physical activity gradually as you start to feel better. Lifestyle  Do not drink alcohol for as long as told by your health care provider. This is especially important if you are taking prescription pain medicines.  Do not engage in sexual activity until your health care provider says that you can do this. General instructions   Do not take baths, swim, or use a hot tub until your health care provider approves.  Drink  enough fluid to keep your urine pale yellow.  Urinate as soon as you feel the need to. Do not try to hold your urine for long periods of time.  If your health care provider approves, you may take a stool softener for 2-3 weeks to prevent you from straining to have a bowel movement.  Wear compression stockings as told by your health care provider. These stockings help to prevent blood clots and reduce swelling in your legs.  Keep all follow-up visits as told by your health care provider. This is important. Contact a health care provider if you have:  Difficulty urinating.  A fever.  Pain that gets worse or does not improve with medicine.  Blood in your urine that does not go away after 1 week of resting and drinking more fluids.  Swelling in your penis or testicles. Get help right away if:  You are unable to urinate.  You are having more blood clots in your urine instead of fewer.  You have: ? Large blood clots. ? A lot of blood in your urine. ? Pain in your back or lower abdomen. ? Pain or swelling in your legs. ? Chills and you are shaking. ? Difficulty breathing or shortness of breath. Summary  After the procedure, it is common to have a small amount of blood in your urine.  Avoid heavy lifting and intense physical activity for as long as told by your health care provider.  Urinate as soon as you feel the need to. Do not try to hold your urine for long periods of time.  Keep all follow-up visits as told by your health care provider. This is important. This information is not intended to replace advice given to you by your health care provider. Make sure you discuss any questions you have with your health care provider. Document Revised: 01/07/2019 Document Reviewed: 06/18/2018 Elsevier Patient Education  2020 [place] Anesthesia, Adult, Care After This sheet gives you information about how to care for yourself after your procedure. Your health care  provider may also give you more specific instructions. If you have problems or questions, contact your health care provider. What can I expect after the procedure? After the procedure, the following side effects are common:  Pain or discomfort at the IV site.  Nausea.  Vomiting.  Sore throat.  Trouble concentrating.  Feeling cold or chills.  Weak or tired.  Sleepiness and fatigue.  Soreness and body aches. These side effects can affect parts of the body that were not involved in surgery. Follow these instructions at home:  For at least 24 hours after the procedure:  Have a responsible adult stay with you. It is important to have someone help care for you until you are awake and alert.  Rest  as needed.  Do not: ? Participate in activities in which you could fall or become injured. ? Drive. ? Use heavy machinery. ? Drink alcohol. ? Take sleeping pills or medicines that cause drowsiness. ? Make important decisions or sign legal documents. ? Take care of children on your own. Eating and drinking  Follow any instructions from your health care provider about eating or drinking restrictions.  When you feel hungry, start by eating small amounts of foods that are soft and easy to digest (bland), such as toast. Gradually return to your regular diet.  Drink enough fluid to keep your urine pale yellow.  If you vomit, rehydrate by drinking water, juice, or clear broth. General instructions  If you have sleep apnea, surgery and certain medicines can increase your risk for breathing problems. Follow instructions from your health care provider about wearing your sleep device: ? Anytime you are sleeping, including during daytime naps. ? While taking prescription pain medicines, sleeping medicines, or medicines that make you drowsy.  Return to your normal activities as told by your health care provider. Ask your health care provider what activities are safe for you.  Take  over-the-counter and prescription medicines only as told by your health care provider.  If you smoke, do not smoke without supervision.  Keep all follow-up visits as told by your health care provider. This is important. Contact a health care provider if:  You have nausea or vomiting that does not get better with medicine.  You cannot eat or drink without vomiting.  You have pain that does not get better with medicine.  You are unable to pass urine.  You develop a skin rash.  You have a fever.  You have redness around your IV site that gets worse. Get help right away if:  You have difficulty breathing.  You have chest pain.  You have blood in your urine or stool, or you vomit blood. Summary  After the procedure, it is common to have a sore throat or nausea. It is also common to feel tired.  Have a responsible adult stay with you for the first 24 hours after general anesthesia. It is important to have someone help care for you until you are awake and alert.  When you feel hungry, start by eating small amounts of foods that are soft and easy to digest (bland), such as toast. Gradually return to your regular diet.  Drink enough fluid to keep your urine pale yellow.  Return to your normal activities as told by your health care provider. Ask your health care provider what activities are safe for you. This information is not intended to replace advice given to you by your health care provider. Make sure you discuss any questions you have with your health care provider. Document Revised: 09/20/2017 Document Reviewed: 05/03/2017 Elsevier Patient Education  [place]. How to Use Chlorhexidine for Bathing Chlorhexidine gluconate (CHG) is a germ-killing (antiseptic) solution that is used to clean the skin. It can get rid of the bacteria that normally live on the skin and can keep them away for about 24 hours. To clean your skin with CHG, you may be given:  A CHG solution to  use in the shower or as part of a sponge bath.  A prepackaged cloth that contains CHG. Cleaning your skin with CHG may help lower the risk for infection:  While you are staying in the intensive care unit of the hospital.  If you have a vascular access, such  as a central line, to provide short-term or long-term access to your veins.  If you have a catheter to drain urine from your bladder.  If you are on a ventilator. A ventilator is a machine that helps you breathe by moving air in and out of your lungs.  After surgery. What are the risks? Risks of using CHG include:  A skin reaction.  Hearing loss, if CHG gets in your ears.  Eye injury, if CHG gets in your eyes and is not rinsed out.  The CHG product catching fire. Make sure that you avoid smoking and flames after applying CHG to your skin. Do not use CHG:  If you have a chlorhexidine allergy or have previously reacted to chlorhexidine.  On babies younger than 16 months of age. How to use CHG solution  Use CHG only as told by your health care provider, and follow the instructions on the label.  Use the full amount of CHG as directed. Usually, this is one bottle. During a shower Follow these steps when using CHG solution during a shower (unless your health care provider gives you different instructions): 1. Start the shower. 2. Use your normal soap and shampoo to wash your face and hair. 3. Turn off the shower or move out of the shower stream. 4. Pour the CHG onto a clean washcloth. Do not use any type of brush or rough-edged sponge. 5. Starting at your neck, lather your body down to your toes. Make sure you follow these instructions: ? If you will be having surgery, pay special attention to the part of your body where you will be having surgery. Scrub this area for at least 1 minute. ? Do not use CHG on your head or face. If the solution gets into your ears or eyes, rinse them well with water. ? Avoid your genital  area. ? Avoid any areas of skin that have broken skin, cuts, or scrapes. ? Scrub your back and under your arms. Make sure to wash skin folds. 6. Let the lather sit on your skin for 1-2 minutes or as long as told by your health care provider. 7. Thoroughly rinse your entire body in the shower. Make sure that all body creases and crevices are rinsed well. 8. Dry off with a clean towel. Do not put any substances on your body afterward--such as powder, lotion, or perfume--unless you are told to do so by your health care provider. Only use lotions that are recommended by the manufacturer. 9. Put on clean clothes or pajamas. 10. If it is the night before your surgery, sleep in clean sheets.  During a sponge bath Follow these steps when using CHG solution during a sponge bath (unless your health care provider gives you different instructions): 1. Use your normal soap and shampoo to wash your face and hair. 2. Pour the CHG onto a clean washcloth. 3. Starting at your neck, lather your body down to your toes. Make sure you follow these instructions: ? If you will be having surgery, pay special attention to the part of your body where you will be having surgery. Scrub this area for at least 1 minute. ? Do not use CHG on your head or face. If the solution gets into your ears or eyes, rinse them well with water. ? Avoid your genital area. ? Avoid any areas of skin that have broken skin, cuts, or scrapes. ? Scrub your back and under your arms. Make sure to wash skin folds. 4. Let  the lather sit on your skin for 1-2 minutes or as long as told by your health care provider. 5. Using a different clean, wet washcloth, thoroughly rinse your entire body. Make sure that all body creases and crevices are rinsed well. 6. Dry off with a clean towel. Do not put any substances on your body afterward--such as powder, lotion, or perfume--unless you are told to do so by your health care provider. Only use lotions that are  recommended by the manufacturer. 7. Put on clean clothes or pajamas. 8. If it is the night before your surgery, sleep in clean sheets. How to use CHG prepackaged cloths  Only use CHG cloths as told by your health care provider, and follow the instructions on the label.  Use the CHG cloth on clean, dry skin.  Do not use the CHG cloth on your head or face unless your health care provider tells you to.  When washing with the CHG cloth: ? Avoid your genital area. ? Avoid any areas of skin that have broken skin, cuts, or scrapes. Before surgery Follow these steps when using a CHG cloth to clean before surgery (unless your health care provider gives you different instructions): 1. Using the CHG cloth, vigorously scrub the part of your body where you will be having surgery. Scrub using a back-and-forth motion for 3 minutes. The area on your body should be completely wet with CHG when you are done scrubbing. 2. Do not rinse. Discard the cloth and let the area air-dry. Do not put any substances on the area afterward, such as powder, lotion, or perfume. 3. Put on clean clothes or pajamas. 4. If it is the night before your surgery, sleep in clean sheets.  For general bathing Follow these steps when using CHG cloths for general bathing (unless your health care provider gives you different instructions). 1. Use a separate CHG cloth for each area of your body. Make sure you wash between any folds of skin and between your fingers and toes. Wash your body in the following order, switching to a new cloth after each step: ? The front of your neck, shoulders, and chest. ? Both of your arms, under your arms, and your hands. ? Your stomach and groin area, avoiding the genitals. ? Your right leg and foot. ? Your left leg and foot. ? The back of your neck, your back, and your buttocks. 2. Do not rinse. Discard the cloth and let the area air-dry. Do not put any substances on your body afterward--such as  powder, lotion, or perfume--unless you are told to do so by your health care provider. Only use lotions that are recommended by the manufacturer. 3. Put on clean clothes or pajamas. Contact a health care provider if:  Your skin gets irritated after scrubbing.  You have questions about using your solution or cloth. Get help right away if:  Your eyes become very red or swollen.  Your eyes itch badly.  Your skin itches badly and is red or swollen.  Your hearing changes.  You have trouble seeing.  You have swelling or tingling in your mouth or throat.  You have trouble breathing.  You swallow any chlorhexidine. Summary  Chlorhexidine gluconate (CHG) is a germ-killing (antiseptic) solution that is used to clean the skin. Cleaning your skin with CHG may help to lower your risk for infection.  You may be given CHG to use for bathing. It may be in a bottle or in a prepackaged cloth to use  on your skin. Carefully follow your health care provider's instructions and the instructions on the product label.  Do not use CHG if you have a chlorhexidine allergy.  Contact your health care provider if your skin gets irritated after scrubbing. This information is not intended to replace advice given to you by your health care provider. Make sure you discuss any questions you have with your health care provider. Document Revised: 12/04/2018 Document Reviewed: 08/15/2017 Elsevier Patient Education  [place].

## 2020-08-10 NOTE — Telephone Encounter (Signed)
Pt called earlier asking for abx. Says he has pus coming from his penis around cath. I changed his cath on 11/8 and [facility], LPN looked at it again on 11/9. No pus was seen either time. Talked with Dr. Alyson Ingles and he said just reaction to the cath being in. Pt called and notified. Pt stated but he was in pain. Pt was sent rx yesterday for Tramadol. He said they would not last until Monday. Pt was told to call back if needed.

## 2020-08-11 ENCOUNTER — Telehealth: Payer: Self-pay

## 2020-08-12 ENCOUNTER — Other Ambulatory Visit (HOSPITAL_COMMUNITY)
Admission: RE | Admit: 2020-08-12 | Discharge: 2020-08-12 | Disposition: A | Discharge status: Home / Self Care | Payer: Medicare HMO | Source: Ambulatory Visit | Attending: Urology | Admitting: Urology

## 2020-08-12 ENCOUNTER — Other Ambulatory Visit: Payer: Self-pay

## 2020-08-12 ENCOUNTER — Encounter (HOSPITAL_COMMUNITY): Payer: Self-pay | Admitting: Anesthesiology

## 2020-08-12 ENCOUNTER — Encounter (HOSPITAL_COMMUNITY)
Admission: RE | Admit: 2020-08-12 | Discharge: 2020-08-12 | Disposition: A | Discharge status: Home / Self Care | Payer: Medicare HMO | Source: Ambulatory Visit | Attending: Urology | Admitting: Urology

## 2020-08-12 ENCOUNTER — Telehealth: Payer: Self-pay

## 2020-08-12 ENCOUNTER — Other Ambulatory Visit: Payer: Self-pay | Admitting: Urology

## 2020-08-12 DIAGNOSIS — R102 Pelvic and perineal pain: Secondary | ICD-10-CM | POA: Diagnosis not present

## 2020-08-12 DIAGNOSIS — Z01812 Encounter for preprocedural laboratory examination: Secondary | ICD-10-CM | POA: Diagnosis not present

## 2020-08-12 DIAGNOSIS — Z79899 Other long term (current) drug therapy: Secondary | ICD-10-CM | POA: Diagnosis not present

## 2020-08-12 DIAGNOSIS — Z20822 Contact with and (suspected) exposure to covid-19: Secondary | ICD-10-CM | POA: Diagnosis not present

## 2020-08-12 LAB — COMPREHENSIVE METABOLIC PANEL
ALT: 16 U/L (ref 0–44)
AST: 17 U/L (ref 15–41)
Albumin: 4.2 g/dL (ref 3.5–5.0)
Alkaline Phosphatase: 48 U/L (ref 38–126)
Anion gap: 13 (ref 5–15)
BUN: 6 mg/dL — ABNORMAL LOW (ref 8–23)
CO2: 25 mmol/L (ref 22–32)
Calcium: 9 mg/dL (ref 8.9–10.3)
Chloride: 88 mmol/L — ABNORMAL LOW (ref 98–111)
Creatinine, Ser: 0.81 mg/dL (ref 0.61–1.24)
GFR, Estimated: >60 mL/min (ref >60)
Glucose, Bld: 78 mg/dL (ref 70–99)
Potassium: 4.3 mmol/L (ref 3.5–5.1)
Sodium: 126 mmol/L — ABNORMAL LOW (ref 135–145)
Total Bilirubin: 0.6 mg/dL (ref 0.3–1.2)
Total Protein: 7.7 g/dL (ref 6.5–8.1)

## 2020-08-12 LAB — RAPID URINE DRUG SCREEN, HOSP PERFORMED
Amphetamines: NOT DETECTED
Barbiturates: NOT DETECTED
Benzodiazepines: NOT DETECTED
Cocaine: NOT DETECTED
Opiates: NOT DETECTED
Tetrahydrocannabinol: NOT DETECTED

## 2020-08-12 LAB — CBC WITH DIFFERENTIAL/PLATELET
Abs Immature Granulocytes: 0.06 10*3/uL (ref 0.00–0.07)
Basophils Absolute: 0 10*3/uL (ref 0.0–0.1)
Basophils Relative: 0 %
Eosinophils Absolute: 0 10*3/uL (ref 0.0–0.5)
Eosinophils Relative: 0 %
HCT: 44.1 % (ref 39.0–52.0)
Hemoglobin: 15.3 g/dL (ref 13.0–17.0)
Immature Granulocytes: 1 %
Lymphocytes Relative: 22 %
Lymphs Abs: 1.5 10*3/uL (ref 0.7–4.0)
MCH: 31.1 pg (ref 26.0–34.0)
MCHC: 34.7 g/dL (ref 30.0–36.0)
MCV: 89.6 fL (ref 80.0–100.0)
Monocytes Absolute: 0.6 10*3/uL (ref 0.1–1.0)
Monocytes Relative: 8 %
Neutro Abs: 4.7 10*3/uL (ref 1.7–7.7)
Neutrophils Relative %: 69 %
Platelets: 295 10*3/uL (ref 150–400)
RBC: 4.92 MIL/uL (ref 4.22–5.81)
RDW: 14 % (ref 11.5–15.5)
WBC: 6.8 10*3/uL (ref 4.0–10.5)
nRBC: 0 % (ref 0.0–0.2)

## 2020-08-12 LAB — SARS CORONAVIRUS 2 (TAT 6-24 HRS): SARS Coronavirus 2: NEGATIVE

## 2020-08-12 MED ORDER — TRAMADOL HCL 50 MG PO TABS: 50.0000 mg | ORAL_TABLET | Freq: Four times a day (QID) | ORAL | 0 refills | Status: DC | PRN

## 2020-08-15 ENCOUNTER — Encounter (HOSPITAL_COMMUNITY): Payer: Self-pay | Admitting: Urology

## 2020-08-15 ENCOUNTER — Telehealth: Payer: Self-pay

## 2020-08-15 DIAGNOSIS — N138 Other obstructive and reflux uropathy: Secondary | ICD-10-CM

## 2020-08-15 MED ORDER — SODIUM CHLORIDE 0.9 % IV SOLN
INTRAVENOUS | Status: AC
  Filled 2020-08-15: qty 20

## 2020-08-15 MED ORDER — CHLORHEXIDINE GLUCONATE 0.12 % MT SOLN
OROMUCOSAL | Status: AC
  Filled 2020-08-15: qty 15

## 2020-08-15 NOTE — Telephone Encounter (Signed)
Med was refilled late Friday evening for pt.

## 2020-08-15 NOTE — Telephone Encounter (Signed)
Pt was sent in med.

## 2020-08-15 NOTE — Telephone Encounter (Signed)
Attempted to call patient to have surgery rescheduled. Per triage note pt would not make surgery due to vomiting.  Number called [phone] - called twice- message received that call could not go through to try later.

## 2020-08-17 ENCOUNTER — Telehealth: Payer: Self-pay | Admitting: Family Medicine

## 2020-08-17 NOTE — Telephone Encounter (Signed)
Pt has been scheduled to discuss placement after surgery. Pt states that he is homeless and is just living with someone at the moment.

## 2020-08-17 NOTE — Telephone Encounter (Signed)
Patient is requesting referral to [facility] in [place].

## 2020-08-22 ENCOUNTER — Ambulatory Visit: Payer: Medicare HMO | Admitting: Urology

## 2020-08-23 ENCOUNTER — Telehealth: Payer: Self-pay | Admitting: Family Medicine

## 2020-08-23 ENCOUNTER — Telehealth: Payer: Self-pay

## 2020-08-23 NOTE — Telephone Encounter (Signed)
Pt should have enough to last until appt. Based off of last refill in system, pt does not take the wellbutrin and protonix should last until appt

## 2020-08-23 NOTE — Telephone Encounter (Signed)
These two RXs were denied. Patient has an appt scheduled for 08/31/20 and would like refill enough to get him through. Please send to same [facility].

## 2020-08-24 ENCOUNTER — Other Ambulatory Visit: Payer: Self-pay

## 2020-08-24 NOTE — Telephone Encounter (Signed)
Pt called again wanting pain med. Spoke with Dr. Alyson Ingles and he said he would not write any more pain meds. Called pt and notified him and he hung up the phone.

## 2020-08-24 NOTE — Telephone Encounter (Signed)
Pt was requesting a refill from Dr. Anitra Lauth for Tramadol. Pt is aware it will be against [place] control substance laws to ask other providers for refill. Pt was advise to contact Dr. Alyson Ingles for refills. Pt CB and wanted to speak clinical staff. Once pt heard I was able to assist pt apologize for "talking sharp" then disconnect call.

## 2020-08-31 ENCOUNTER — Ambulatory Visit: Payer: Medicare HMO | Admitting: Family Medicine

## 2020-09-07 ENCOUNTER — Ambulatory Visit (INDEPENDENT_AMBULATORY_CARE_PROVIDER_SITE_OTHER): Payer: Medicare HMO

## 2020-09-07 ENCOUNTER — Other Ambulatory Visit: Payer: Self-pay

## 2020-09-07 DIAGNOSIS — R339 Retention of urine, unspecified: Secondary | ICD-10-CM | POA: Diagnosis not present

## 2020-09-07 NOTE — Progress Notes (Signed)
Cath Change/ Replacement  Patient is present today for a catheter change due to urinary retention.  59ml of water was removed from the balloon, a 16FR foley cath was removed with out difficulty.  Patient was cleaned and prepped in a sterile fashion with betadine. A 16 FR foley cath was replaced into the bladder no complications were noted Urine return was noted 49ml and urine was yellow in color. The balloon was filled with 2ml of sterile water. A bedside bag was attached for drainage.  A night bag was also given to the patient and patient was given instruction on how to change from one bag to another. Patient was given proper instruction on catheter care.    Performed by: Jorge Ny

## 2020-09-08 ENCOUNTER — Ambulatory Visit (INDEPENDENT_AMBULATORY_CARE_PROVIDER_SITE_OTHER): Payer: Medicare HMO | Admitting: Family Medicine

## 2020-09-08 ENCOUNTER — Encounter: Payer: Self-pay | Admitting: Family Medicine

## 2020-09-08 VITALS — BP 120/80 | HR 116 | Temp 98.2°F | Resp 16 | Ht 69.0 in | Wt 182.8 lb

## 2020-09-08 DIAGNOSIS — I480 Paroxysmal atrial fibrillation: Secondary | ICD-10-CM

## 2020-09-08 DIAGNOSIS — R000 Tachycardia, unspecified: Secondary | ICD-10-CM

## 2020-09-08 DIAGNOSIS — F330 Major depressive disorder, recurrent, mild: Secondary | ICD-10-CM | POA: Diagnosis not present

## 2020-09-08 DIAGNOSIS — E538 Deficiency of other specified B group vitamins: Secondary | ICD-10-CM | POA: Diagnosis not present

## 2020-09-08 DIAGNOSIS — I10 Essential (primary) hypertension: Secondary | ICD-10-CM | POA: Diagnosis not present

## 2020-09-08 DIAGNOSIS — E559 Vitamin D deficiency, unspecified: Secondary | ICD-10-CM

## 2020-09-08 DIAGNOSIS — N138 Other obstructive and reflux uropathy: Secondary | ICD-10-CM

## 2020-09-08 DIAGNOSIS — N401 Enlarged prostate with lower urinary tract symptoms: Secondary | ICD-10-CM

## 2020-09-08 DIAGNOSIS — J449 Chronic obstructive pulmonary disease, unspecified: Secondary | ICD-10-CM | POA: Diagnosis not present

## 2020-09-08 LAB — BASIC METABOLIC PANEL
BUN: 8 mg/dL (ref 6–23)
CO2: 27 mEq/L (ref 19–32)
Calcium: 9.3 mg/dL (ref 8.4–10.5)
Chloride: 93 mEq/L — ABNORMAL LOW (ref 96–112)
Creatinine, Ser: 0.9 mg/dL (ref 0.40–1.50)
GFR: 88.02 mL/min (ref >60.00)
Glucose, Bld: 80 mg/dL (ref 70–99)
Potassium: 4.1 mEq/L (ref 3.5–5.1)
Sodium: 133 mEq/L — ABNORMAL LOW (ref 135–145)

## 2020-09-08 LAB — MAGNESIUM: Magnesium: 2 mg/dL (ref 1.5–2.5)

## 2020-09-08 LAB — PHOSPHORUS: Phosphorus: 3.9 mg/dL (ref 2.3–4.6)

## 2020-09-08 LAB — VITAMIN D 25 HYDROXY (VIT D DEFICIENCY, FRACTURES): VITD: 35.64 ng/mL (ref 30.00–100.00)

## 2020-09-08 LAB — VITAMIN B12: Vitamin B-12: 173 pg/mL — ABNORMAL LOW (ref 211–911)

## 2020-09-08 MED ORDER — CYANOCOBALAMIN 1000 MCG/ML IJ SOLN
1000.0000 ug | Freq: Once | INTRAMUSCULAR | Status: AC
  Administered 2020-09-08: 1000 ug via INTRAMUSCULAR

## 2020-09-08 MED ORDER — METOPROLOL SUCCINATE ER 100 MG PO TB24
ORAL_TABLET | ORAL | 3 refills | Status: DC
Start: 2020-09-08 — End: 2021-01-02
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
## 2020-09-09 ENCOUNTER — Other Ambulatory Visit: Payer: Self-pay | Admitting: Family Medicine

## 2020-09-09 NOTE — Telephone Encounter (Signed)
Patient states at his appt yesterday, Dr. Anitra Lauth was going to refill Seroquel but it has not been sent in yet. Please send to same [facility].

## 2020-09-09 NOTE — Telephone Encounter (Signed)
RF request for Seroquel LOV: 09/08/20 Next ov: n/a Last written: 08/09/20 (120,0)  Please advise, thanks. Med pending

## 2020-09-10 MED ORDER — QUETIAPINE FUMARATE 50 MG PO TABS: ORAL_TABLET | ORAL | 5 refills | Status: DC

## 2020-09-21 NOTE — Patient Instructions (Signed)
   Your procedure is scheduled on: 09/29/2020  Report to [place] Entrance at   11:00  AM.  Call this number if you have problems the morning of surgery: [phone]   Remember:   Do not Eat or Drink after midnight         No Smoking the morning of surgery  :  Take these medicines the morning of surgery with A SIP OF WATER:    Do not wear jewelry, make-up or nail polish.  Do not wear lotions, powders, or perfumes. You may wear deodorant.  Do not shave 48 hours prior to surgery. Men may shave face and neck.  Do not bring valuables to the hospital.  Contacts, dentures or bridgework may not be worn into surgery.  Leave suitcase in the car. After surgery it may be brought to your room.  For patients admitted to the hospital, checkout time is 11:00 AM the day of discharge.   Patients discharged the day of surgery will not be allowed to drive home.    Special Instructions: Shower using CHG night before surgery and shower the day of surgery use CHG.  Use special wash - you have one bottle of CHG for all showers.  You should use approximately 1/2 of the bottle for each shower.

## 2020-09-27 ENCOUNTER — Other Ambulatory Visit: Payer: Self-pay

## 2020-09-27 ENCOUNTER — Encounter (HOSPITAL_COMMUNITY)
Admission: RE | Admit: 2020-09-27 | Discharge: 2020-09-27 | Disposition: A | Discharge status: Home / Self Care | Payer: Medicare HMO | Source: Ambulatory Visit | Attending: Urology | Admitting: Urology

## 2020-09-27 ENCOUNTER — Other Ambulatory Visit (HOSPITAL_COMMUNITY)
Admission: RE | Admit: 2020-09-27 | Discharge: 2020-09-27 | Disposition: A | Discharge status: Home / Self Care | Payer: Medicare HMO | Source: Ambulatory Visit | Attending: Urology | Admitting: Urology

## 2020-09-27 DIAGNOSIS — Z01812 Encounter for preprocedural laboratory examination: Secondary | ICD-10-CM | POA: Diagnosis not present

## 2020-09-27 LAB — CBC
HCT: 45.1 % (ref 39.0–52.0)
Hemoglobin: 14.7 g/dL (ref 13.0–17.0)
MCH: 31.5 pg (ref 26.0–34.0)
MCHC: 32.6 g/dL (ref 30.0–36.0)
MCV: 96.8 fL (ref 80.0–100.0)
Platelets: 242 10*3/uL (ref 150–400)
RBC: 4.66 MIL/uL (ref 4.22–5.81)
RDW: 14.4 % (ref 11.5–15.5)
WBC: 5.7 10*3/uL (ref 4.0–10.5)
nRBC: 0 % (ref 0.0–0.2)

## 2020-09-27 LAB — BASIC METABOLIC PANEL
Anion gap: 10 (ref 5–15)
BUN: 11 mg/dL (ref 8–23)
CO2: 25 mmol/L (ref 22–32)
Calcium: 8.9 mg/dL (ref 8.9–10.3)
Chloride: 95 mmol/L — ABNORMAL LOW (ref 98–111)
Creatinine, Ser: 1.07 mg/dL (ref 0.61–1.24)
GFR, Estimated: >60 mL/min (ref >60)
Glucose, Bld: 93 mg/dL (ref 70–99)
Potassium: 4.3 mmol/L (ref 3.5–5.1)
Sodium: 130 mmol/L — ABNORMAL LOW (ref 135–145)

## 2020-09-27 NOTE — Progress Notes (Signed)
Per Zada Finders Mr. Tritz could not have his covid test because he did not stop taking his eliquis. Called registration to make them aware and let them know pt. Was arrived as well.

## 2020-09-29 DIAGNOSIS — N138 Other obstructive and reflux uropathy: Secondary | ICD-10-CM

## 2020-10-21 ENCOUNTER — Other Ambulatory Visit: Payer: Self-pay

## 2020-10-21 ENCOUNTER — Ambulatory Visit (INDEPENDENT_AMBULATORY_CARE_PROVIDER_SITE_OTHER): Payer: Medicare HMO

## 2020-10-21 DIAGNOSIS — R339 Retention of urine, unspecified: Secondary | ICD-10-CM

## 2020-10-21 NOTE — Progress Notes (Signed)
Cath Change/ Replacement  Patient is present today for a catheter change due to urinary retention.  71ml of water was removed from the balloon, a 16FR foley cath was removed with out difficulty.  Patient was cleaned and prepped in a sterile fashion with betadine. A 16 FR foley cath was replaced into the bladder no complications were noted Urine return was noted 17ml and urine was pale yellow in color. The balloon was filled with 17ml of sterile water. A bedside bag was attached for drainage.  A night bag was also given to the patient and patient was given instruction on how to change from one bag to another. Patient was given proper instruction on catheter care.    Performed by: Park Pope Rn  Follow up: 1 month cath change

## 2020-11-02 ENCOUNTER — Telehealth: Payer: Self-pay

## 2020-11-02 ENCOUNTER — Other Ambulatory Visit: Payer: Self-pay | Admitting: Family Medicine

## 2020-11-02 NOTE — Telephone Encounter (Signed)
Patient is scheduled to have surgery on 11/25/19 and is requesting something for his nerves to take prior to surgery; night before AND/OR day of surgery.  Qty#1-2 He states that his PCP knows him better than the surgeon does. Patient scheduled 3 follow up appt on 12/08/20 with Dr. Anitra Lauth.  Please call [phone]

## 2020-11-03 NOTE — Telephone Encounter (Signed)
Spoke with patient and advised PCP would not be sending in medication. Voiced understanding

## 2020-11-03 NOTE — Telephone Encounter (Signed)
I don't prescribe any controlled substances for [place], not even on a one time basis.  Sorry-thx

## 2020-11-03 NOTE — Telephone Encounter (Signed)
Please advise. Will pt need an appt?

## 2020-11-18 ENCOUNTER — Telehealth: Payer: Self-pay | Admitting: Family Medicine

## 2020-11-18 NOTE — Telephone Encounter (Signed)
Patient is having prostate surgery 11/24/20 and is requesting home health referral to help with cooking, cleaning, bathing, wound care. He would like help to start before the 24th if possible.

## 2020-11-18 NOTE — Telephone Encounter (Signed)
Patient advised surgical team can assist with home health needs after procedure. Voiced understanding

## 2020-11-21 NOTE — Patient Instructions (Signed)
Jacob Frank  11/21/2020     @PREFPERIOPPHARMACY @   Your procedure is scheduled on  11/24/2020.   Report to [facility] at  0700  A.M.   Call this number if you have problems the morning of surgery:  [phone]   Remember:  Do not eat or drink after midnight.                       Take these medicines the morning of surgery with A SIP OF WATER  Amlodipine, wellbutrin, metoprolol, protonix, flomax.            Use your nebulizer and your inhalers before you come. Bring your inhalers with you.               Shower with CHG the night before and the morning of your procedure. DO NOT put CHG on your face, hair or genitals.          After your night shower, dry off with a clean towel, put on clean clothes to sleep in and place clean sheets on your bed before you sleep.                          DO NOT sleep with pets this night.      After your morning shower, dry off with a clean towel, put on clean comfortable clothes and brush your teeth before you come to the hospital.        Do not wear jewelry, make-up or nail polish.  Do not wear lotions, powders, or perfumes, or deodorant.  Do not shave 48 hours prior to surgery.  Men may shave face and neck.  Do not bring valuables to the hospital.  [facility] is not responsible for any belongings or valuables.    Contacts, dentures or bridgework may not be worn into surgery.  Leave your suitcase in the car.  After surgery it may be brought to your room.  For patients admitted to the hospital, discharge time will be determined by your treatment team.  Patients discharged the day of surgery will not be allowed to drive home and must have someone with you for 24 hours.   Special instructions:   DO NOT smoke tobacco or vape the morning of your procedure.  Please read over the following fact sheets that you were given. Coughing and Deep Breathing, Surgical Site Infection Prevention, Anesthesia Post-op Instructions and  Care and Recovery After Surgery       Transurethral Resection of the Prostate, Care After This sheet gives you information about how to care for yourself after your procedure. Your health care provider may also give you more specific instructions. If you have problems or questions, contact your health care provider. What can I expect after the procedure? After the procedure, it is common to have:  Mild pain in your lower abdomen.  Soreness or mild discomfort in your penis from having the catheter inserted during the procedure.  A feeling of urgency when you need to urinate.  A small amount of blood in your urine. You may notice some small blood clots in your urine. These are normal. Follow these instructions at home: Medicines  Take over-the-counter and prescription medicines only as told by your health care provider.  If you were prescribed an antibiotic medicine, take it as told by your health care provider. Do not stop taking the  antibiotic even if you start to feel better.  Ask your health care provider if the medicine prescribed to you: ? Requires you to avoid driving or using heavy machinery. ? Can cause constipation. You may need to take actions to prevent or treat constipation, such as:  Take over-the-counter or prescription medicines.  Eat foods that are high in fiber, such as fresh fruits and vegetables, whole grains, and beans.  Limit foods that are high in fat and processed sugars, such as fried or sweet foods.  Do not drive for 24 hours if you were given a sedative during your procedure. Activity  Return to your normal activities as told by your health care provider. Ask your health care provider what activities are safe for you.  Do not lift anything that is heavier than 10 lb (4.5 kg), or the limit that you are told, for 3 weeks after the procedure or until your health care provider says that it is safe.  Avoid intense physical activity for as long as told by  your health care provider.  Avoid sitting for a long time without moving. Get up and move around one or more times every few hours. This helps to prevent blood clots. You may increase your physical activity gradually as you start to feel better.   Lifestyle  Do not drink alcohol for as long as told by your health care provider. This is especially important if you are taking prescription pain medicines.  Do not engage in sexual activity until your health care provider says that you can do this. General instructions  Do not take baths, swim, or use a hot tub until your health care provider approves.  Drink enough fluid to keep your urine pale yellow.  Urinate as soon as you feel the need to. Do not try to hold your urine for long periods of time.  If your health care provider approves, you may take a stool softener for 2-3 weeks to prevent you from straining to have a bowel movement.  Wear compression stockings as told by your health care provider. These stockings help to prevent blood clots and reduce swelling in your legs.  Keep all follow-up visits as told by your health care provider. This is important.   Contact a health care provider if you have:  Difficulty urinating.  A fever.  Pain that gets worse or does not improve with medicine.  Blood in your urine that does not go away after 1 week of resting and drinking more fluids.  Swelling in your penis or testicles. Get help right away if:  You are unable to urinate.  You are having more blood clots in your urine instead of fewer.  You have: ? Large blood clots. ? A lot of blood in your urine. ? Pain in your back or lower abdomen. ? Pain or swelling in your legs. ? Chills and you are shaking. ? Difficulty breathing or shortness of breath. Summary  After the procedure, it is common to have a small amount of blood in your urine.  Avoid heavy lifting and intense physical activity for as long as told by your health care  provider.  Urinate as soon as you feel the need to. Do not try to hold your urine for long periods of time.  Keep all follow-up visits as told by your health care provider. This is important. This information is not intended to replace advice given to you by your health care provider. Make sure you discuss any  questions you have with your health care provider. Document Revised: 01/07/2019 Document Reviewed: 06/18/2018 Elsevier Patient Education  2021 [place] Anesthesia, Adult, Care After This sheet gives you information about how to care for yourself after your procedure. Your health care provider may also give you more specific instructions. If you have problems or questions, contact your health care provider. What can I expect after the procedure? After the procedure, the following side effects are common:  Pain or discomfort at the IV site.  Nausea.  Vomiting.  Sore throat.  Trouble concentrating.  Feeling cold or chills.  Feeling weak or tired.  Sleepiness and fatigue.  Soreness and body aches. These side effects can affect parts of the body that were not involved in surgery. Follow these instructions at home: For the time period you were told by your health care provider:  Rest.  Do not participate in activities where you could fall or become injured.  Do not drive or use machinery.  Do not drink alcohol.  Do not take sleeping pills or medicines that cause drowsiness.  Do not make important decisions or sign legal documents.  Do not take care of children on your own.   Eating and drinking  Follow any instructions from your health care provider about eating or drinking restrictions.  When you feel hungry, start by eating small amounts of foods that are soft and easy to digest (bland), such as toast. Gradually return to your regular diet.  Drink enough fluid to keep your urine pale yellow.  If you vomit, rehydrate by drinking water,  juice, or clear broth. General instructions  If you have sleep apnea, surgery and certain medicines can increase your risk for breathing problems. Follow instructions from your health care provider about wearing your sleep device: ? Anytime you are sleeping, including during daytime naps. ? While taking prescription pain medicines, sleeping medicines, or medicines that make you drowsy.  Have a responsible adult stay with you for the time you are told. It is important to have someone help care for you until you are awake and alert.  Return to your normal activities as told by your health care provider. Ask your health care provider what activities are safe for you.  Take over-the-counter and prescription medicines only as told by your health care provider.  If you smoke, do not smoke without supervision.  Keep all follow-up visits as told by your health care provider. This is important. Contact a health care provider if:  You have nausea or vomiting that does not get better with medicine.  You cannot eat or drink without vomiting.  You have pain that does not get better with medicine.  You are unable to pass urine.  You develop a skin rash.  You have a fever.  You have redness around your IV site that gets worse. Get help right away if:  You have difficulty breathing.  You have chest pain.  You have blood in your urine or stool, or you vomit blood. Summary  After the procedure, it is common to have a sore throat or nausea. It is also common to feel tired.  Have a responsible adult stay with you for the time you are told. It is important to have someone help care for you until you are awake and alert.  When you feel hungry, start by eating small amounts of foods that are soft and easy to digest (bland), such as toast. Gradually return to your  regular diet.  Drink enough fluid to keep your urine pale yellow.  Return to your normal activities as told by your health care  provider. Ask your health care provider what activities are safe for you. This information is not intended to replace advice given to you by your health care provider. Make sure you discuss any questions you have with your health care provider. Document Revised: 06/02/2020 Document Reviewed: 12/31/2019 Elsevier Patient Education  2021 [employer].

## 2020-11-22 ENCOUNTER — Encounter (HOSPITAL_COMMUNITY): Payer: Self-pay

## 2020-11-22 ENCOUNTER — Ambulatory Visit (HOSPITAL_COMMUNITY)
Admission: RE | Admit: 2020-11-22 | Discharge: 2020-11-22 | Disposition: A | Discharge status: Home / Self Care | Payer: Medicare HMO | Source: Ambulatory Visit | Attending: Urology | Admitting: Urology

## 2020-11-22 ENCOUNTER — Encounter (HOSPITAL_COMMUNITY)
Admission: RE | Admit: 2020-11-22 | Discharge: 2020-11-22 | Disposition: A | Discharge status: Home / Self Care | Payer: Medicare HMO | Source: Ambulatory Visit | Attending: Urology | Admitting: Urology

## 2020-11-22 ENCOUNTER — Other Ambulatory Visit: Payer: Self-pay

## 2020-11-22 ENCOUNTER — Other Ambulatory Visit (HOSPITAL_COMMUNITY)
Admission: RE | Admit: 2020-11-22 | Discharge: 2020-11-22 | Disposition: A | Discharge status: Home / Self Care | Payer: Medicare HMO | Source: Ambulatory Visit | Attending: Urology | Admitting: Urology

## 2020-11-22 DIAGNOSIS — Z20822 Contact with and (suspected) exposure to covid-19: Secondary | ICD-10-CM | POA: Insufficient documentation

## 2020-11-22 DIAGNOSIS — D860 Sarcoidosis of lung: Secondary | ICD-10-CM | POA: Insufficient documentation

## 2020-11-22 DIAGNOSIS — Z01812 Encounter for preprocedural laboratory examination: Secondary | ICD-10-CM | POA: Insufficient documentation

## 2020-11-22 DIAGNOSIS — J439 Emphysema, unspecified: Secondary | ICD-10-CM | POA: Diagnosis not present

## 2020-11-22 DIAGNOSIS — J449 Chronic obstructive pulmonary disease, unspecified: Secondary | ICD-10-CM | POA: Diagnosis not present

## 2020-11-22 DIAGNOSIS — Z01818 Encounter for other preprocedural examination: Secondary | ICD-10-CM | POA: Insufficient documentation

## 2020-11-22 HISTORY — DX: Personal history of other diseases of the digestive system: Z87.19

## 2020-11-22 LAB — RAPID URINE DRUG SCREEN, HOSP PERFORMED
Amphetamines: NOT DETECTED
Barbiturates: NOT DETECTED
Benzodiazepines: NOT DETECTED
Cocaine: NOT DETECTED
Opiates: NOT DETECTED
Tetrahydrocannabinol: NOT DETECTED

## 2020-11-22 LAB — SARS CORONAVIRUS 2 (TAT 6-24 HRS): SARS Coronavirus 2: NEGATIVE

## 2020-11-22 IMAGING — DX DG CHEST 2V
2 series · 2 of 2 positions shown · non-contrast
Comparison: [DATE]

CLINICAL DATA: Pre-op respiratory exam emphysema.  COPD.

EXAM:
CHEST - 2 VIEW

[chest pa]
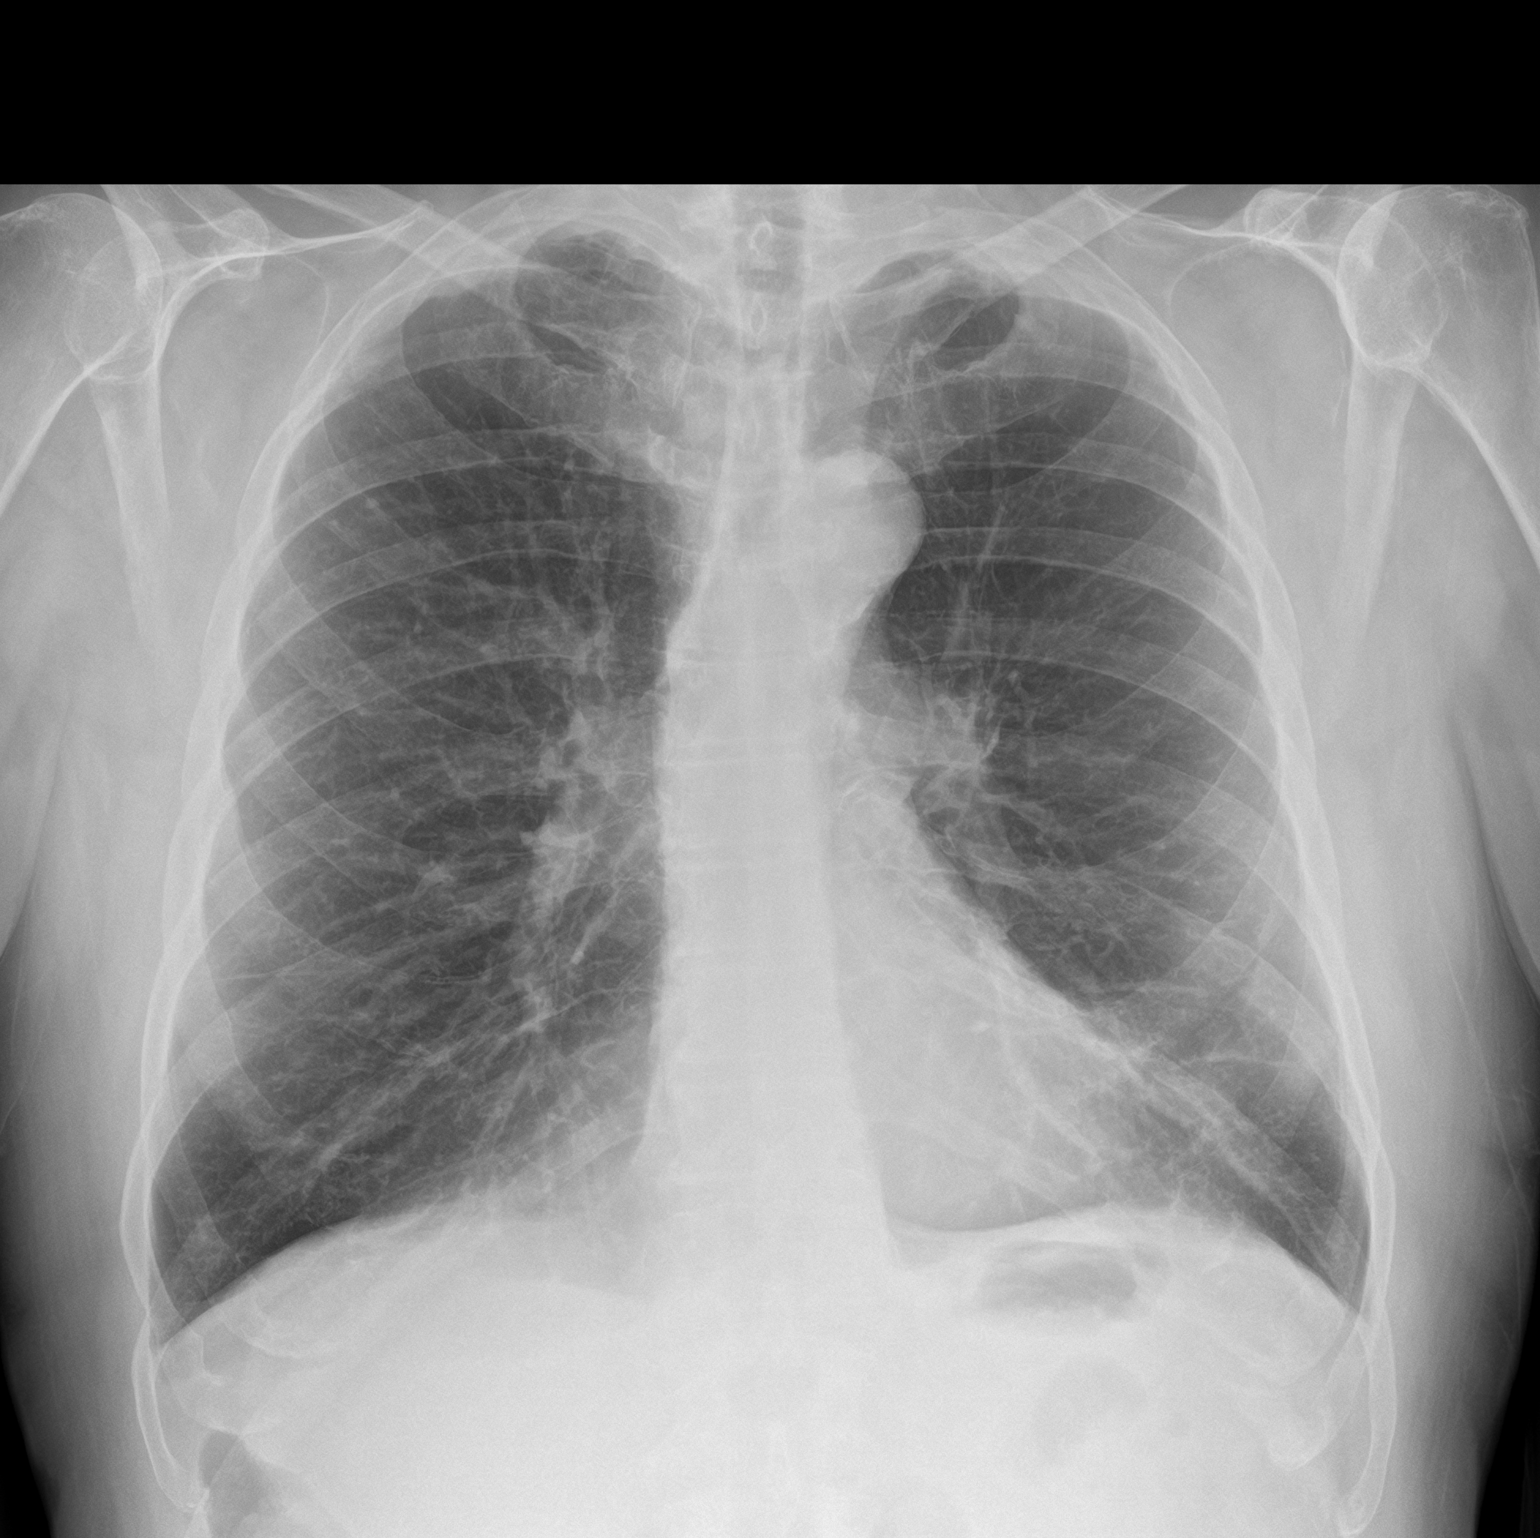

[chest lat]
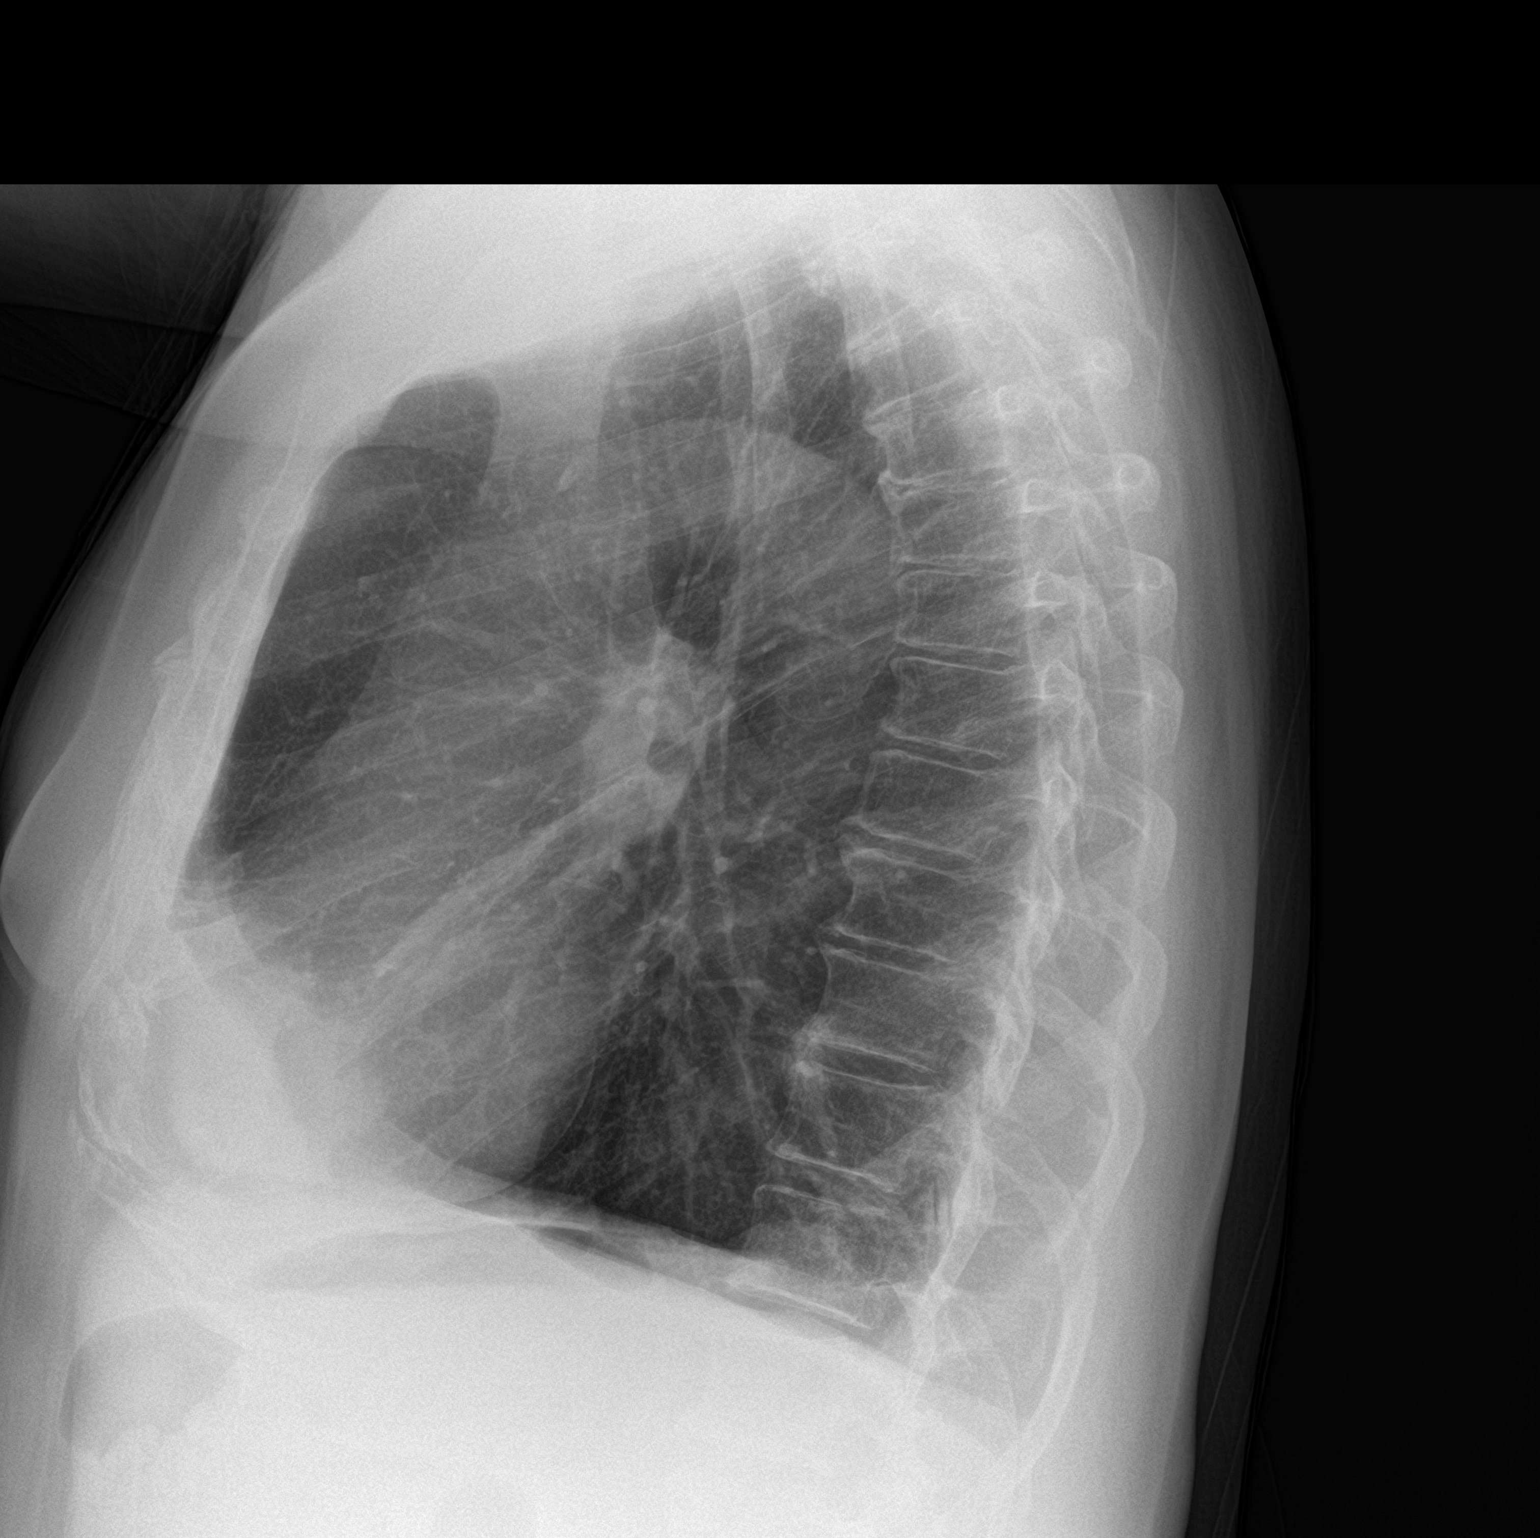

[2 of 2 positions shown; findings below may reference images not displayed]

FINDINGS: The heart size and mediastinal contours are within normal limits.
Aortic atherosclerotic calcification noted. Pulmonary hyperinflation
again seen, consistent with COPD. Both lungs are clear. The
visualized skeletal structures are unremarkable.
IMPRESSION: COPD. No active cardiopulmonary disease.

## 2020-11-22 NOTE — Pre-Procedure Instructions (Signed)
Patient arrives for his PAT visit with shortness of breath with any type of exertion.  Sats are 94% on room air even though he seems to struggle with breathing when bending over to tie his shoes.He has a productive cough with thick frothy sputum.   We will send patient for a chest xray per Dr Briant Cedar and if results are okay then we will proceed with surgery.

## 2020-11-24 ENCOUNTER — Ambulatory Visit (HOSPITAL_COMMUNITY): Payer: Medicare HMO | Admitting: Anesthesiology

## 2020-11-24 ENCOUNTER — Ambulatory Visit: Payer: Medicare HMO

## 2020-11-24 ENCOUNTER — Other Ambulatory Visit: Payer: Self-pay

## 2020-11-24 ENCOUNTER — Encounter (HOSPITAL_COMMUNITY)
Admission: RE | Disposition: A | Discharge status: Home / Self Care | Payer: Self-pay | Source: Home / Self Care | Attending: Urology

## 2020-11-24 ENCOUNTER — Encounter (HOSPITAL_COMMUNITY): Payer: Self-pay | Admitting: Urology

## 2020-11-24 ENCOUNTER — Observation Stay (HOSPITAL_COMMUNITY)
Admission: RE | Admit: 2020-11-24 | Discharge: 2020-11-25 | Disposition: A | Discharge status: Home / Self Care | Payer: Medicare HMO | Attending: Urology | Admitting: Urology

## 2020-11-24 DIAGNOSIS — R338 Other retention of urine: Secondary | ICD-10-CM | POA: Insufficient documentation

## 2020-11-24 DIAGNOSIS — Z79899 Other long term (current) drug therapy: Secondary | ICD-10-CM | POA: Diagnosis not present

## 2020-11-24 DIAGNOSIS — N401 Enlarged prostate with lower urinary tract symptoms: Secondary | ICD-10-CM | POA: Diagnosis not present

## 2020-11-24 DIAGNOSIS — J449 Chronic obstructive pulmonary disease, unspecified: Secondary | ICD-10-CM | POA: Diagnosis not present

## 2020-11-24 DIAGNOSIS — I10 Essential (primary) hypertension: Secondary | ICD-10-CM | POA: Insufficient documentation

## 2020-11-24 DIAGNOSIS — N138 Other obstructive and reflux uropathy: Secondary | ICD-10-CM | POA: Insufficient documentation

## 2020-11-24 DIAGNOSIS — F17210 Nicotine dependence, cigarettes, uncomplicated: Secondary | ICD-10-CM | POA: Insufficient documentation

## 2020-11-24 DIAGNOSIS — N400 Enlarged prostate without lower urinary tract symptoms: Secondary | ICD-10-CM | POA: Diagnosis not present

## 2020-11-24 HISTORY — PX: CYSTOSCOPY: SHX5120

## 2020-11-24 HISTORY — PX: TRANSURETHRAL RESECTION OF PROSTATE: SHX73

## 2020-11-24 LAB — CBC
HCT: 36.3 % — ABNORMAL LOW (ref 39.0–52.0)
Hemoglobin: 12.4 g/dL — ABNORMAL LOW (ref 13.0–17.0)
MCH: 34.3 pg — ABNORMAL HIGH (ref 26.0–34.0)
MCHC: 34.2 g/dL (ref 30.0–36.0)
MCV: 100.3 fL — ABNORMAL HIGH (ref 80.0–100.0)
Platelets: 162 10*3/uL (ref 150–400)
RBC: 3.62 MIL/uL — ABNORMAL LOW (ref 4.22–5.81)
RDW: 12.8 % (ref 11.5–15.5)
WBC: 6.2 10*3/uL (ref 4.0–10.5)
nRBC: 0 % (ref 0.0–0.2)

## 2020-11-24 LAB — BASIC METABOLIC PANEL
Anion gap: 7 (ref 5–15)
BUN: 11 mg/dL (ref 8–23)
CO2: 28 mmol/L (ref 22–32)
Calcium: 8.2 mg/dL — ABNORMAL LOW (ref 8.9–10.3)
Chloride: 94 mmol/L — ABNORMAL LOW (ref 98–111)
Creatinine, Ser: 0.93 mg/dL (ref 0.61–1.24)
GFR, Estimated: >60 mL/min (ref >60)
Glucose, Bld: 95 mg/dL (ref 70–99)
Potassium: 4.1 mmol/L (ref 3.5–5.1)
Sodium: 129 mmol/L — ABNORMAL LOW (ref 135–145)

## 2020-11-24 SURGERY — TURP (TRANSURETHRAL RESECTION OF PROSTATE)
Anesthesia: General | Site: Prostate

## 2020-11-24 MED ORDER — DIPHENHYDRAMINE HCL 12.5 MG/5ML PO ELIX: 12.5000 mg | ORAL_SOLUTION | Freq: Four times a day (QID) | ORAL | Status: DC | PRN

## 2020-11-24 MED ORDER — FENTANYL CITRATE (PF) 100 MCG/2ML IJ SOLN
INTRAMUSCULAR | Status: DC | PRN
  Administered 2020-11-24: 100 ug via INTRAVENOUS
  Administered 2020-11-24 (×2): 50 ug via INTRAVENOUS

## 2020-11-24 MED ORDER — LACTATED RINGERS IV SOLN: INTRAVENOUS | Status: DC

## 2020-11-24 MED ORDER — ORAL CARE MOUTH RINSE: 15.0000 mL | Freq: Once | OROMUCOSAL | Status: AC

## 2020-11-24 MED ORDER — FENTANYL CITRATE (PF) 100 MCG/2ML IJ SOLN
INTRAMUSCULAR | Status: AC
  Filled 2020-11-24: qty 2

## 2020-11-24 MED ORDER — METOPROLOL SUCCINATE ER 50 MG PO TB24
200.0000 mg | ORAL_TABLET | Freq: Every day | ORAL | Status: DC
  Administered 2020-11-24: 200 mg via ORAL
  Filled 2020-11-24: qty 4

## 2020-11-24 MED ORDER — IPRATROPIUM-ALBUTEROL 0.5-2.5 (3) MG/3ML IN SOLN: 3.0000 mL | Freq: Once | RESPIRATORY_TRACT | Status: AC

## 2020-11-24 MED ORDER — AMLODIPINE BESYLATE 5 MG PO TABS
10.0000 mg | ORAL_TABLET | Freq: Every day | ORAL | Status: DC
  Administered 2020-11-24: 10 mg via ORAL
  Filled 2020-11-24: qty 2
  Filled 2020-11-24 (×2): qty 1

## 2020-11-24 MED ORDER — QUETIAPINE FUMARATE 100 MG PO TABS
200.0000 mg | ORAL_TABLET | Freq: Every day | ORAL | Status: DC
  Administered 2020-11-24: 200 mg via ORAL
  Filled 2020-11-24 (×2): qty 2

## 2020-11-24 MED ORDER — DEXAMETHASONE SODIUM PHOSPHATE 10 MG/ML IJ SOLN
INTRAMUSCULAR | Status: AC
  Filled 2020-11-24: qty 1

## 2020-11-24 MED ORDER — CHLORHEXIDINE GLUCONATE 0.12 % MT SOLN
15.0000 mL | Freq: Once | OROMUCOSAL | Status: AC
  Filled 2020-11-24: qty 15

## 2020-11-24 MED ORDER — ACETAMINOPHEN 325 MG PO TABS: 650.0000 mg | ORAL_TABLET | ORAL | Status: DC | PRN

## 2020-11-24 MED ORDER — THIAMINE HCL 100 MG PO TABS
100.0000 mg | ORAL_TABLET | Freq: Every day | ORAL | Status: DC
  Administered 2020-11-24: 100 mg via ORAL
  Filled 2020-11-24 (×3): qty 1

## 2020-11-24 MED ORDER — CHLORHEXIDINE GLUCONATE 0.12 % MT SOLN
OROMUCOSAL | Status: AC
  Administered 2020-11-24: 15 mL via OROMUCOSAL
  Filled 2020-11-24: qty 15

## 2020-11-24 MED ORDER — BUPROPION HCL ER (XL) 300 MG PO TB24
300.0000 mg | ORAL_TABLET | Freq: Every day | ORAL | Status: DC
  Administered 2020-11-24: 300 mg via ORAL
  Filled 2020-11-24: qty 1

## 2020-11-24 MED ORDER — MOMETASONE FURO-FORMOTEROL FUM 200-5 MCG/ACT IN AERO
2.0000 | INHALATION_SPRAY | Freq: Two times a day (BID) | RESPIRATORY_TRACT | Status: DC
  Administered 2020-11-24 – 2020-11-25 (×2): 2 via RESPIRATORY_TRACT
  Filled 2020-11-24: qty 8.8

## 2020-11-24 MED ORDER — PROPOFOL 10 MG/ML IV BOLUS
INTRAVENOUS | Status: DC | PRN
  Administered 2020-11-24: 150 mg via INTRAVENOUS
  Administered 2020-11-24: 50 mg via INTRAVENOUS

## 2020-11-24 MED ORDER — ONDANSETRON HCL 4 MG/2ML IJ SOLN: 4.0000 mg | INTRAMUSCULAR | Status: DC | PRN

## 2020-11-24 MED ORDER — ZOLPIDEM TARTRATE 5 MG PO TABS
5.0000 mg | ORAL_TABLET | Freq: Every evening | ORAL | Status: DC | PRN
  Administered 2020-11-24: 5 mg via ORAL
  Filled 2020-11-24: qty 1

## 2020-11-24 MED ORDER — SODIUM CHLORIDE 0.9 % IV SOLN
2.0000 g | INTRAVENOUS | Status: AC
  Administered 2020-11-24: 2 g via INTRAVENOUS
  Filled 2020-11-24: qty 20

## 2020-11-24 MED ORDER — ALBUTEROL SULFATE (2.5 MG/3ML) 0.083% IN NEBU
3.0000 mL | INHALATION_SOLUTION | Freq: Four times a day (QID) | RESPIRATORY_TRACT | Status: DC | PRN
  Administered 2020-11-24: 3 mL via RESPIRATORY_TRACT
  Filled 2020-11-24: qty 3

## 2020-11-24 MED ORDER — ONDANSETRON HCL 4 MG/2ML IJ SOLN: 4.0000 mg | Freq: Once | INTRAMUSCULAR | Status: DC | PRN

## 2020-11-24 MED ORDER — FENTANYL CITRATE (PF) 100 MCG/2ML IJ SOLN
25.0000 ug | INTRAMUSCULAR | Status: DC | PRN
  Administered 2020-11-24 (×2): 50 ug via INTRAVENOUS

## 2020-11-24 MED ORDER — STERILE WATER FOR IRRIGATION IR SOLN
Status: DC | PRN
  Administered 2020-11-24: 500 mL

## 2020-11-24 MED ORDER — BUDESONIDE 0.5 MG/2ML IN SUSP: 0.5000 mg | Freq: Every day | RESPIRATORY_TRACT | Status: DC

## 2020-11-24 MED ORDER — EPHEDRINE 5 MG/ML INJ
INTRAVENOUS | Status: AC
  Filled 2020-11-24: qty 10

## 2020-11-24 MED ORDER — SODIUM CHLORIDE 0.9 % IR SOLN
3000.0000 mL | Status: DC
  Administered 2020-11-24: 3000 mL

## 2020-11-24 MED ORDER — PROPOFOL 10 MG/ML IV BOLUS
INTRAVENOUS | Status: AC
  Filled 2020-11-24: qty 20

## 2020-11-24 MED ORDER — HYDROMORPHONE HCL 1 MG/ML IJ SOLN
0.5000 mg | INTRAMUSCULAR | Status: DC | PRN
  Administered 2020-11-24: 1 mg via INTRAVENOUS
  Filled 2020-11-24: qty 1

## 2020-11-24 MED ORDER — IPRATROPIUM-ALBUTEROL 0.5-2.5 (3) MG/3ML IN SOLN
RESPIRATORY_TRACT | Status: AC
  Administered 2020-11-24: 3 mL via RESPIRATORY_TRACT
  Filled 2020-11-24: qty 3

## 2020-11-24 MED ORDER — CHLORHEXIDINE GLUCONATE CLOTH 2 % EX PADS: 6.0000 | MEDICATED_PAD | Freq: Every day | CUTANEOUS | Status: DC

## 2020-11-24 MED ORDER — DEXAMETHASONE SODIUM PHOSPHATE 10 MG/ML IJ SOLN
INTRAMUSCULAR | Status: DC | PRN
  Administered 2020-11-24: 10 mg via INTRAVENOUS

## 2020-11-24 MED ORDER — SODIUM CHLORIDE 0.9 % IR SOLN
Status: DC | PRN
  Administered 2020-11-24 (×9): 3000 mL

## 2020-11-24 MED ORDER — SODIUM CHLORIDE 0.9 % IV SOLN
INTRAVENOUS | Status: AC
  Filled 2020-11-24: qty 20

## 2020-11-24 MED ORDER — MIDAZOLAM HCL 5 MG/5ML IJ SOLN
INTRAMUSCULAR | Status: DC | PRN
  Administered 2020-11-24: 2 mg via INTRAVENOUS

## 2020-11-24 MED ORDER — BELLADONNA ALKALOIDS-OPIUM 16.2-60 MG RE SUPP
1.0000 | Freq: Four times a day (QID) | RECTAL | Status: DC | PRN
Start: 2020-11-24 — End: 2020-11-25

## 2020-11-24 MED ORDER — PANTOPRAZOLE SODIUM 40 MG PO TBEC
40.0000 mg | DELAYED_RELEASE_TABLET | Freq: Every day | ORAL | Status: DC
  Administered 2020-11-24: 40 mg via ORAL
  Filled 2020-11-24: qty 1

## 2020-11-24 MED ORDER — DIPHENHYDRAMINE HCL 50 MG/ML IJ SOLN: 12.5000 mg | Freq: Four times a day (QID) | INTRAMUSCULAR | Status: DC | PRN

## 2020-11-24 MED ORDER — LIDOCAINE HCL (CARDIAC) PF 100 MG/5ML IV SOSY
PREFILLED_SYRINGE | INTRAVENOUS | Status: DC | PRN
  Administered 2020-11-24: 40 mg via INTRAVENOUS

## 2020-11-24 MED ORDER — ONDANSETRON HCL 4 MG/2ML IJ SOLN
INTRAMUSCULAR | Status: AC
  Filled 2020-11-24: qty 2

## 2020-11-24 MED ORDER — EPHEDRINE SULFATE 50 MG/ML IJ SOLN
INTRAMUSCULAR | Status: DC | PRN
  Administered 2020-11-24 (×5): 10 mg via INTRAVENOUS

## 2020-11-24 MED ORDER — OXYCODONE-ACETAMINOPHEN 5-325 MG PO TABS
1.0000 | ORAL_TABLET | ORAL | Status: DC | PRN
  Administered 2020-11-24: 2 via ORAL
  Administered 2020-11-25: 1 via ORAL
  Filled 2020-11-24: qty 2
  Filled 2020-11-24 (×2): qty 1

## 2020-11-24 MED ORDER — ONDANSETRON HCL 4 MG/2ML IJ SOLN
INTRAMUSCULAR | Status: DC | PRN
  Administered 2020-11-24: 4 mg via INTRAVENOUS

## 2020-11-24 MED ORDER — SODIUM CHLORIDE 0.9 % IV SOLN: INTRAVENOUS | Status: DC

## 2020-11-24 MED ORDER — MIDAZOLAM HCL 2 MG/2ML IJ SOLN
INTRAMUSCULAR | Status: AC
  Filled 2020-11-24: qty 2

## 2020-11-24 SURGICAL SUPPLY — 26 items
BAG DRAIN URO TABLE W/ADPT NS (BAG) ×3 IMPLANT
BAG DRN 8 ADPR NS SKTRN CSTL (BAG) ×2
BAG DRN URN TUBE DRIP CHMBR (OSTOMY) ×2
BAG HAMPER (MISCELLANEOUS) ×3 IMPLANT
BAG URINE DRAIN TURP 4L (OSTOMY) ×3 IMPLANT
CATH FOLEY 3WAY 30CC 22F (CATHETERS) ×3 IMPLANT
CLOTH BEACON ORANGE TIMEOUT ST (SAFETY) ×3 IMPLANT
ELECT REM PT RETURN 9FT ADLT (ELECTROSURGICAL) ×3
ELECTRODE REM PT RTRN 9FT ADLT (ELECTROSURGICAL) ×2 IMPLANT
GLOVE BIO SURGEON STRL SZ8 (GLOVE) ×3 IMPLANT
GLOVE SURG UNDER POLY LF SZ7 (GLOVE) ×6 IMPLANT
GOWN STRL REUS W/TWL LRG LVL3 (GOWN DISPOSABLE) ×6 IMPLANT
GOWN STRL REUS W/TWL XL LVL3 (GOWN DISPOSABLE) ×3 IMPLANT
IV NS IRRIG 3000ML ARTHROMATIC (IV SOLUTION) ×17 IMPLANT
KIT TURNOVER CYSTO (KITS) ×3 IMPLANT
LOOP CUT BIPOLAR 24F LRG (ELECTROSURGICAL) ×3 IMPLANT
MANIFOLD NEPTUNE II (INSTRUMENTS) ×3 IMPLANT
PACK CYSTO (CUSTOM PROCEDURE TRAY) ×3 IMPLANT
PAD ARMBOARD 7.5X6 YLW CONV (MISCELLANEOUS) ×3 IMPLANT
SYR 30ML LL (SYRINGE) ×3 IMPLANT
SYR TOOMEY IRRIG 70ML (MISCELLANEOUS) ×3
SYRINGE TOOMEY IRRIG 70ML (MISCELLANEOUS) ×2 IMPLANT
TOWEL NATURAL 4PK STERILE (DISPOSABLE) ×3 IMPLANT
TOWEL OR 17X26 4PK STRL BLUE (TOWEL DISPOSABLE) ×3 IMPLANT
WATER STERILE IRR 3000ML UROMA (IV SOLUTION) ×3 IMPLANT
WATER STERILE IRR 500ML POUR (IV SOLUTION) ×3 IMPLANT

## 2020-11-24 NOTE — Anesthesia Postprocedure Evaluation (Signed)
Anesthesia Post Note  Patient: Jacob Frank  Procedure(s) Performed: TRANSURETHRAL RESECTION OF THE PROSTATE (TURP) (N/A Prostate) CYSTOSCOPY (N/A )  Patient location during evaluation: PACU Anesthesia Type: General Level of consciousness: awake Pain management: pain level controlled Vital Signs Assessment: post-procedure vital signs reviewed and stable Respiratory status: spontaneous breathing and respiratory function stable Cardiovascular status: blood pressure returned to baseline and stable Postop Assessment: no headache and no apparent nausea or vomiting Anesthetic complications: no   No complications documented.   Last Vitals:  Vitals:   11/24/20 1445 11/24/20 1522  BP: (!) 125/94 135/89  Pulse: 76 72  Resp: 20 16  Temp: 36.7 C 36.9 C  SpO2: 97% 95%    Last Pain:  Vitals:   11/24/20 1522  TempSrc: Oral  PainSc: 10-Worst pain ever                 Louann Sjogren

## 2020-11-24 NOTE — Anesthesia Preprocedure Evaluation (Signed)
Anesthesia Evaluation  Patient identified by MRN, date of birth, ID band Patient awake    Reviewed: Allergy & Precautions, H&P , NPO status , Patient's Chart, lab work & pertinent test results, reviewed documented beta blocker date and time   Airway Mallampati: II  TM Distance: >3 FB Neck ROM: full    Dental no notable dental hx. (+) Edentulous Upper, Edentulous Lower   Pulmonary COPD,  COPD inhaler, Current Smoker,    Pulmonary exam normal breath sounds clear to auscultation       Cardiovascular Exercise Tolerance: Good hypertension, negative cardio ROS   Rhythm:irregular Rate:Normal     Neuro/Psych PSYCHIATRIC DISORDERS Anxiety Depression  Neuromuscular disease    GI/Hepatic hiatal hernia, (+)     substance abuse  alcohol use,   Endo/Other  negative endocrine ROS  Renal/GU Renal disease  negative genitourinary   Musculoskeletal   Abdominal   Peds  Hematology negative hematology ROS (+)   Anesthesia Other Findings   Reproductive/Obstetrics negative OB ROS                             Anesthesia Physical Anesthesia Plan  ASA: III  Anesthesia Plan: General and General LMA   Post-op Pain Management:    Induction:   PONV Risk Score and Plan: Ondansetron  Airway Management Planned:   Additional Equipment:   Intra-op Plan:   Post-operative Plan:   Informed Consent: I have reviewed the patients History and Physical, chart, labs and discussed the procedure including the risks, benefits and alternatives for the proposed anesthesia with the patient or authorized representative who has indicated his/her understanding and acceptance.     Dental Advisory Given  Plan Discussed with: CRNA  Anesthesia Plan Comments:         Anesthesia Quick Evaluation

## 2020-11-24 NOTE — Transfer of Care (Signed)
Immediate Anesthesia Transfer of Care Note  Patient: Jacob Frank  Procedure(s) Performed: TRANSURETHRAL RESECTION OF THE PROSTATE (TURP) (N/A Prostate) CYSTOSCOPY (N/A )  Patient Location: PACU  Anesthesia Type:General  Level of Consciousness: awake and alert   Airway & Oxygen Therapy: Patient Spontanous Breathing  Post-op Assessment: Report given to RN and Post -op Vital signs reviewed and stable  Post vital signs: Reviewed and stable  Last Vitals:  Vitals Value Taken Time  BP 116/62   Temp    Pulse 80 11/24/20 1158  Resp 19 11/24/20 1158  SpO2 93 % 11/24/20 1158  Vitals shown include unvalidated device data.  Last Pain:  Vitals:   11/24/20 0839  TempSrc: Oral  PainSc: 7       Patients Stated Pain Goal: 7 (68/03/21 2248)  Complications: No complications documented.

## 2020-11-24 NOTE — Anesthesia Procedure Notes (Signed)
Procedure Name: LMA Insertion Date/Time: 11/24/2020 10:27 AM Performed by: Jonna Munro, CRNA Pre-anesthesia Checklist: Patient identified, Emergency Drugs available, Suction available, Patient being monitored and Timeout performed Patient Re-evaluated:Patient Re-evaluated prior to induction Oxygen Delivery Method: Circle system utilized Preoxygenation: Pre-oxygenation with 100% oxygen Induction Type: IV induction LMA: LMA inserted LMA Size: 5.0 Number of attempts: 1 Placement Confirmation: positive ETCO2 and breath sounds checked- equal and bilateral Tube secured with: Tape Dental Injury: Teeth and Oropharynx as per pre-operative assessment

## 2020-11-24 NOTE — Op Note (Signed)
Preoperative diagnosis: BPH  Postoperative diagnosis: BPH  Procedure: 1 cystoscopy 2. Transurethral resection of the prostate  Attending: Nicolette Bang  Anesthesia: General  Estimated blood loss: Minimal  Drains: 22 French foley  Specimens: 1. Prostate Chips  Antibiotics: Rocephin  Findings: Trilobar prostate enlargement. Ureteral orifices in normal anatomic location.   Indications: Patient is a 70 year old male with a history of BPH and urinary retention.  After discussing treatment options, they decided proceed with transurethral resection of the prostate.  Procedure her in detail: The patient was brought to the operating room and a brief timeout was done to ensure correct patient, correct procedure, correct site.  General anesthesia was administered patient was placed in dorsal lithotomy position.  Their genitalia was then prepped and draped in usual sterile fashion.  A rigid 1 French cystoscope was passed in the urethra and the bladder.  Bladder was inspected and we noted no masses or lesions.  the ureteral orifices were in the normal orthotopic locations. removed the cystoscope and placed a resectoscope into the bladder. We then turned our attention to the prostate resection. Using the bipolar resectoscope we resected the median lobe first from the bladder neck to the verumontanum. We then started at the 12 oclock position on the left lobe and resection to the 6 o'clock position from the bladder neck to the verumontanum. We then did the same resection of the right lobe. Once the resection was complete we then cauterized individual bleeders. We then removed the prostate chips and sent them for pathology.  We then re-inspected the prostatic fossa and found no residual bleeding.  the bladder was then drained, a 22 French foley was placed and this concluded the procedure which was well tolerated by patient.  Complications: None  Condition: Stable, extubated, transferred to  PACU  Plan: Patient is admitted overnight with continuous bladder irrigation. If their urine is clear tomorrow they will be discharged home and followup in 5 days for foley catheter removal and pathology discussion.
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
## 2020-11-24 NOTE — Plan of Care (Signed)

## 2020-11-25 ENCOUNTER — Encounter (HOSPITAL_COMMUNITY): Payer: Self-pay | Admitting: Urology

## 2020-11-25 DIAGNOSIS — I10 Essential (primary) hypertension: Secondary | ICD-10-CM | POA: Diagnosis not present

## 2020-11-25 DIAGNOSIS — N401 Enlarged prostate with lower urinary tract symptoms: Secondary | ICD-10-CM | POA: Diagnosis not present

## 2020-11-25 DIAGNOSIS — J449 Chronic obstructive pulmonary disease, unspecified: Secondary | ICD-10-CM | POA: Diagnosis not present

## 2020-11-25 DIAGNOSIS — N138 Other obstructive and reflux uropathy: Secondary | ICD-10-CM | POA: Diagnosis not present

## 2020-11-25 DIAGNOSIS — Z79899 Other long term (current) drug therapy: Secondary | ICD-10-CM | POA: Diagnosis not present

## 2020-11-25 DIAGNOSIS — R338 Other retention of urine: Secondary | ICD-10-CM | POA: Diagnosis not present

## 2020-11-25 DIAGNOSIS — F17210 Nicotine dependence, cigarettes, uncomplicated: Secondary | ICD-10-CM | POA: Diagnosis not present

## 2020-11-25 LAB — CBC
HCT: 35.4 % — ABNORMAL LOW (ref 39.0–52.0)
Hemoglobin: 11.7 g/dL — ABNORMAL LOW (ref 13.0–17.0)
MCH: 34.1 pg — ABNORMAL HIGH (ref 26.0–34.0)
MCHC: 33.1 g/dL (ref 30.0–36.0)
MCV: 103.2 fL — ABNORMAL HIGH (ref 80.0–100.0)
Platelets: 122 10*3/uL — ABNORMAL LOW (ref 150–400)
RBC: 3.43 MIL/uL — ABNORMAL LOW (ref 4.22–5.81)
RDW: 13.1 % (ref 11.5–15.5)
WBC: 5.3 10*3/uL (ref 4.0–10.5)
nRBC: 0 % (ref 0.0–0.2)

## 2020-11-25 LAB — BASIC METABOLIC PANEL
Anion gap: 4 — ABNORMAL LOW (ref 5–15)
BUN: 12 mg/dL (ref 8–23)
CO2: 31 mmol/L (ref 22–32)
Calcium: 8.1 mg/dL — ABNORMAL LOW (ref 8.9–10.3)
Chloride: 100 mmol/L (ref 98–111)
Creatinine, Ser: 0.99 mg/dL (ref 0.61–1.24)
GFR, Estimated: >60 mL/min (ref >60)
Glucose, Bld: 86 mg/dL (ref 70–99)
Potassium: 4.2 mmol/L (ref 3.5–5.1)
Sodium: 135 mmol/L (ref 135–145)

## 2020-11-25 MED ORDER — OXYCODONE-ACETAMINOPHEN 5-325 MG PO TABS: 1.0000 | ORAL_TABLET | ORAL | 0 refills | Status: DC | PRN
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
## 2020-11-25 NOTE — Plan of Care (Signed)
  Problem: Education: Goal: Knowledge of General Education information will improve Description: Including pain rating scale, medication(s)/side effects and non-pharmacologic comfort measures 11/25/2020 1040 by Adam Phenix, RN Outcome: Adequate for Discharge 11/25/2020 0729 by Adam Phenix, RN Outcome: Progressing   Problem: Health Behavior/Discharge Planning: Goal: Ability to manage health-related needs will improve 11/25/2020 1040 by Adam Phenix, RN Outcome: Adequate for Discharge 11/25/2020 0729 by Adam Phenix, RN Outcome: Progressing   Problem: Clinical Measurements: Goal: Ability to maintain clinical measurements within normal limits will improve 11/25/2020 1040 by Adam Phenix, RN Outcome: Adequate for Discharge 11/25/2020 0729 by Adam Phenix, RN Outcome: Progressing Goal: Will remain free from infection 11/25/2020 1040 by Adam Phenix, RN Outcome: Adequate for Discharge 11/25/2020 0729 by Adam Phenix, RN Outcome: Progressing Goal: Diagnostic test results will improve 11/25/2020 1040 by Adam Phenix, RN Outcome: Adequate for Discharge 11/25/2020 0729 by Adam Phenix, RN Outcome: Progressing Goal: Respiratory complications will improve 11/25/2020 1040 by Adam Phenix, RN Outcome: Adequate for Discharge 11/25/2020 0729 by Adam Phenix, RN Outcome: Progressing Goal: Cardiovascular complication will be avoided 11/25/2020 1040 by Adam Phenix, RN Outcome: Adequate for Discharge 11/25/2020 0729 by Adam Phenix, RN Outcome: Progressing   Problem: Activity: Goal: Risk for activity intolerance will decrease 11/25/2020 1040 by Adam Phenix, RN Outcome: Adequate for Discharge 11/25/2020 0729 by Adam Phenix, RN Outcome: Progressing   Problem: Nutrition: Goal: Adequate nutrition will be maintained 11/25/2020 1040 by Adam Phenix, RN Outcome:  Adequate for Discharge 11/25/2020 0729 by Adam Phenix, RN Outcome: Progressing   Problem: Coping: Goal: Level of anxiety will decrease 11/25/2020 1040 by Adam Phenix, RN Outcome: Adequate for Discharge 11/25/2020 0729 by Adam Phenix, RN Outcome: Progressing   Problem: Elimination: Goal: Will not experience complications related to bowel motility 11/25/2020 1040 by Adam Phenix, RN Outcome: Adequate for Discharge 11/25/2020 0729 by Adam Phenix, RN Outcome: Progressing Goal: Will not experience complications related to urinary retention 11/25/2020 1040 by Adam Phenix, RN Outcome: Adequate for Discharge 11/25/2020 0729 by Adam Phenix, RN Outcome: Progressing   Problem: Pain Managment: Goal: General experience of comfort will improve 11/25/2020 1040 by Adam Phenix, RN Outcome: Adequate for Discharge 11/25/2020 0729 by Adam Phenix, RN Outcome: Progressing   Problem: Safety: Goal: Ability to remain free from injury will improve 11/25/2020 1040 by Adam Phenix, RN Outcome: Adequate for Discharge 11/25/2020 0729 by Adam Phenix, RN Outcome: Progressing   Problem: Skin Integrity: Goal: Risk for impaired skin integrity will decrease 11/25/2020 1040 by Adam Phenix, RN Outcome: Adequate for Discharge 11/25/2020 0729 by Adam Phenix, RN Outcome: Progressing

## 2020-11-25 NOTE — Plan of Care (Signed)

## 2020-11-25 NOTE — Discharge Instructions (Signed)
Transurethral Resection of the Prostate, Care After °This sheet gives you information about how to care for yourself after your procedure. Your health care provider may also give you more specific instructions. If you have problems or questions, contact your health care provider. °What can I expect after the procedure? °After the procedure, it is common to have: °· Mild pain in your lower abdomen. °· Soreness or mild discomfort in your penis from having the catheter inserted during the procedure. °· A feeling of urgency when you need to urinate. °· A small amount of blood in your urine. You may notice some small blood clots in your urine. These are normal. °Follow these instructions at home: °Medicines °· Take over-the-counter and prescription medicines only as told by your health care provider. °· If you were prescribed an antibiotic medicine, take it as told by your health care provider. Do not stop taking the antibiotic even if you start to feel better. °· Ask your health care provider if the medicine prescribed to you: °? Requires you to avoid driving or using heavy machinery. °? Can cause constipation. You may need to take actions to prevent or treat constipation, such as: °§ Take over-the-counter or prescription medicines. °§ Eat foods that are high in fiber, such as fresh fruits and vegetables, whole grains, and beans. °§ Limit foods that are high in fat and processed sugars, such as fried or sweet foods. °· Do not drive for 24 hours if you were given a sedative during your procedure. °Activity °· Return to your normal activities as told by your health care provider. Ask your health care provider what activities are safe for you. °· Do not lift anything that is heavier than 10 lb (4.5 kg), or the limit that you are told, for 3 weeks after the procedure or until your health care provider says that it is safe. °· Avoid intense physical activity for as long as told by your health care provider. °· Avoid sitting  for a long time without moving. Get up and move around one or more times every few hours. This helps to prevent blood clots. You may increase your physical activity gradually as you start to feel better.   °Lifestyle °· Do not drink alcohol for as long as told by your health care provider. This is especially important if you are taking prescription pain medicines. °· Do not engage in sexual activity until your health care provider says that you can do this. °General instructions °· Do not take baths, swim, or use a hot tub until your health care provider approves. °· Drink enough fluid to keep your urine pale yellow. °· Urinate as soon as you feel the need to. Do not try to hold your urine for long periods of time. °· If your health care provider approves, you may take a stool softener for 2-3 weeks to prevent you from straining to have a bowel movement. °· Wear compression stockings as told by your health care provider. These stockings help to prevent blood clots and reduce swelling in your legs. °· Keep all follow-up visits as told by your health care provider. This is important.   °Contact a health care provider if you have: °· Difficulty urinating. °· A fever. °· Pain that gets worse or does not improve with medicine. °· Blood in your urine that does not go away after 1 week of resting and drinking more fluids. °· Swelling in your penis or testicles. °Get help right away if: °· You   are unable to urinate. °· You are having more blood clots in your urine instead of fewer. °· You have: °? Large blood clots. °? A lot of blood in your urine. °? Pain in your back or lower abdomen. °? Pain or swelling in your legs. °? Chills and you are shaking. °? Difficulty breathing or shortness of breath. °Summary °· After the procedure, it is common to have a small amount of blood in your urine. °· Avoid heavy lifting and intense physical activity for as long as told by your health care provider. °· Urinate as soon as you feel the  need to. Do not try to hold your urine for long periods of time. °· Keep all follow-up visits as told by your health care provider. This is important. °This information is not intended to replace advice given to you by your health care provider. Make sure you discuss any questions you have with your health care provider. °Document Revised: 01/07/2019 Document Reviewed: 06/18/2018 °Elsevier Patient Education © 2021 [employer] ° °

## 2020-11-28 ENCOUNTER — Other Ambulatory Visit: Payer: Self-pay | Admitting: Urology

## 2020-11-28 LAB — SURGICAL PATHOLOGY

## 2020-11-28 MED ORDER — OXYCODONE-ACETAMINOPHEN 5-325 MG PO TABS: 1.0000 | ORAL_TABLET | ORAL | 0 refills | Status: DC | PRN

## 2020-11-28 NOTE — Telephone Encounter (Signed)
Patient called for a pain medication refill please.

## 2020-11-30 ENCOUNTER — Encounter: Payer: Self-pay | Admitting: Urology

## 2020-11-30 ENCOUNTER — Other Ambulatory Visit: Payer: Self-pay

## 2020-11-30 ENCOUNTER — Ambulatory Visit (INDEPENDENT_AMBULATORY_CARE_PROVIDER_SITE_OTHER): Payer: Medicare HMO | Admitting: Urology

## 2020-11-30 VITALS — BP 113/72 | HR 89 | Temp 99.5°F | Ht 69.0 in | Wt 199.4 lb

## 2020-11-30 DIAGNOSIS — R339 Retention of urine, unspecified: Secondary | ICD-10-CM | POA: Diagnosis not present

## 2020-11-30 NOTE — Progress Notes (Signed)
Urological Symptom Review  Patient is experiencing the following symptoms: Blood in urine   Review of Systems  Gastrointestinal (upper)  : Negative for upper GI symptoms  Gastrointestinal (lower) : Negative for lower GI symptoms  Constitutional : Negative for symptoms  Skin: Negative for skin symptoms  Eyes: Negative for eye symptoms  Ear/Nose/Throat : Negative for Ear/Nose/Throat symptoms  Hematologic/Lymphatic: Negative for Hematologic/Lymphatic symptoms  Cardiovascular : Negative for cardiovascular symptoms  Respiratory : Negative for respiratory symptoms  Endocrine: Negative for endocrine symptoms  Musculoskeletal: Negative for musculoskeletal symptoms  Neurological: Negative for neurological symptoms  Psychologic: Negative for psychiatric symptoms.bua

## 2020-11-30 NOTE — Progress Notes (Signed)
  Uroflow    Maximum Flow:   3 Average flow:       2 Voiding time:     25 Secs Flow time:          19 secs Time to maximum flow:   1 sec Voided volume:     48 total   Instilled 120

## 2020-12-01 ENCOUNTER — Ambulatory Visit (INDEPENDENT_AMBULATORY_CARE_PROVIDER_SITE_OTHER): Payer: Medicare HMO

## 2020-12-01 ENCOUNTER — Other Ambulatory Visit: Payer: Self-pay

## 2020-12-01 DIAGNOSIS — N401 Enlarged prostate with lower urinary tract symptoms: Secondary | ICD-10-CM

## 2020-12-01 DIAGNOSIS — N138 Other obstructive and reflux uropathy: Secondary | ICD-10-CM | POA: Diagnosis not present

## 2020-12-01 DIAGNOSIS — R339 Retention of urine, unspecified: Secondary | ICD-10-CM

## 2020-12-01 LAB — BLADDER SCAN AMB NON-IMAGING: Scan Result: 13

## 2020-12-01 NOTE — Progress Notes (Signed)
Bladder Scan Patient cannot void:13 ml Performed By: Durenda Guthrie, lpn  Patient to return in 1 month with PVR

## 2020-12-02 ENCOUNTER — Telehealth: Payer: Self-pay | Admitting: Family Medicine

## 2020-12-02 NOTE — Telephone Encounter (Signed)
Received in the mail, medical aide forms to be filled out. Placed in Dr. Idelle Leech inbox.

## 2020-12-02 NOTE — Telephone Encounter (Signed)
Spoke with patient to advise forms received but PCP is out of the office until Thursday, 3/10. We will follow up with him now. Placed on PCP desk to review and sign, if appropriate.

## 2020-12-06 ENCOUNTER — Encounter: Payer: Self-pay | Admitting: Urology
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
## 2020-12-06 NOTE — Patient Instructions (Signed)

## 2020-12-08 ENCOUNTER — Ambulatory Visit: Payer: Medicare HMO | Admitting: Family Medicine

## 2020-12-08 ENCOUNTER — Encounter: Payer: Self-pay | Admitting: Family Medicine

## 2020-12-08 NOTE — Telephone Encounter (Signed)
Patient was advised to keep scheduled appt on 3/17. Will hold onto paperwork until then.

## 2020-12-08 NOTE — Telephone Encounter (Signed)
This paperwork requires that I have seen him within the last 90d---I saw him a little over 90d ago. I need to have him come in for f/u (or telemed visit okay) before I can complete this paperwork.-thx

## 2020-12-09 ENCOUNTER — Ambulatory Visit: Payer: Medicare HMO

## 2020-12-12 ENCOUNTER — Other Ambulatory Visit: Payer: Self-pay | Admitting: Urology

## 2020-12-12 ENCOUNTER — Other Ambulatory Visit: Payer: Self-pay

## 2020-12-12 NOTE — Telephone Encounter (Signed)
Pt left message asking for a refill on pain med. Dr. Alyson Ingles denied request. Pt called and made aware.

## 2020-12-12 NOTE — Progress Notes (Signed)
Pt called and left message for refill on pain med. Spoke with Dr. Alyson Ingles and he denied request. Pt called and notified.

## 2020-12-14 ENCOUNTER — Other Ambulatory Visit: Payer: Self-pay

## 2020-12-15 ENCOUNTER — Encounter: Payer: Self-pay | Admitting: Family Medicine

## 2020-12-15 ENCOUNTER — Ambulatory Visit (INDEPENDENT_AMBULATORY_CARE_PROVIDER_SITE_OTHER): Payer: Medicare HMO | Admitting: Family Medicine

## 2020-12-15 DIAGNOSIS — F1010 Alcohol abuse, uncomplicated: Secondary | ICD-10-CM

## 2020-12-15 DIAGNOSIS — E538 Deficiency of other specified B group vitamins: Secondary | ICD-10-CM | POA: Diagnosis not present

## 2020-12-15 DIAGNOSIS — F329 Major depressive disorder, single episode, unspecified: Secondary | ICD-10-CM

## 2020-12-15 DIAGNOSIS — I480 Paroxysmal atrial fibrillation: Secondary | ICD-10-CM

## 2020-12-15 DIAGNOSIS — N401 Enlarged prostate with lower urinary tract symptoms: Secondary | ICD-10-CM | POA: Diagnosis not present

## 2020-12-15 DIAGNOSIS — I10 Essential (primary) hypertension: Secondary | ICD-10-CM

## 2020-12-15 DIAGNOSIS — J441 Chronic obstructive pulmonary disease with (acute) exacerbation: Secondary | ICD-10-CM

## 2020-12-15 MED ORDER — CYANOCOBALAMIN 1000 MCG/ML IJ SOLN
1000.0000 ug | Freq: Once | INTRAMUSCULAR | Status: AC
Start: 2020-12-15 — End: 2020-12-15
  Administered 2020-12-15: 1000 ug via INTRAMUSCULAR
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
## 2020-12-16 ENCOUNTER — Telehealth: Payer: Self-pay | Admitting: Family Medicine

## 2020-12-16 NOTE — Telephone Encounter (Signed)
Noted  

## 2020-12-16 NOTE — Telephone Encounter (Signed)
Patient was seen yesterday and states he gave Dr. Anitra Lauth paperwork to fill out for a rest home. Patient would like to go to [facility] on [address] in [place].

## 2020-12-16 NOTE — Telephone Encounter (Signed)
LM for pt to return call regarding forms. No forms were provided for this and this is not something we would be able to do at this time. The forms we currently have are for him to receive services from the [facility].

## 2020-12-16 NOTE — Telephone Encounter (Signed)
Patient returned call and I relayed Britt's message below. Patient voiced unhappy understanding.

## 2020-12-19 ENCOUNTER — Encounter: Payer: Self-pay | Admitting: Family Medicine

## 2020-12-27 ENCOUNTER — Encounter (HOSPITAL_COMMUNITY): Payer: Self-pay

## 2020-12-27 ENCOUNTER — Inpatient Hospital Stay (HOSPITAL_COMMUNITY): Payer: Medicare HMO

## 2020-12-27 ENCOUNTER — Inpatient Hospital Stay (HOSPITAL_COMMUNITY)
Admission: EM | Admit: 2020-12-27 | Discharge: 2021-01-02 | DRG: 641 | Disposition: A | Discharge status: Home Health | Payer: Medicare HMO | Attending: Internal Medicine | Admitting: Internal Medicine

## 2020-12-27 ENCOUNTER — Other Ambulatory Visit: Payer: Self-pay

## 2020-12-27 ENCOUNTER — Emergency Department (HOSPITAL_COMMUNITY): Payer: Medicare HMO

## 2020-12-27 DIAGNOSIS — F32A Depression, unspecified: Secondary | ICD-10-CM

## 2020-12-27 DIAGNOSIS — R1111 Vomiting without nausea: Secondary | ICD-10-CM | POA: Diagnosis not present

## 2020-12-27 DIAGNOSIS — Z8261 Family history of arthritis: Secondary | ICD-10-CM

## 2020-12-27 DIAGNOSIS — F10239 Alcohol dependence with withdrawal, unspecified: Secondary | ICD-10-CM

## 2020-12-27 DIAGNOSIS — H905 Unspecified sensorineural hearing loss: Secondary | ICD-10-CM | POA: Diagnosis present

## 2020-12-27 DIAGNOSIS — Z7951 Long term (current) use of inhaled steroids: Secondary | ICD-10-CM

## 2020-12-27 DIAGNOSIS — F10230 Alcohol dependence with withdrawal, uncomplicated: Secondary | ICD-10-CM | POA: Diagnosis not present

## 2020-12-27 DIAGNOSIS — F1020 Alcohol dependence, uncomplicated: Secondary | ICD-10-CM | POA: Diagnosis not present

## 2020-12-27 DIAGNOSIS — K2920 Alcoholic gastritis without bleeding: Secondary | ICD-10-CM | POA: Diagnosis present

## 2020-12-27 DIAGNOSIS — R059 Cough, unspecified: Secondary | ICD-10-CM | POA: Diagnosis not present

## 2020-12-27 DIAGNOSIS — I10 Essential (primary) hypertension: Secondary | ICD-10-CM | POA: Diagnosis not present

## 2020-12-27 DIAGNOSIS — Z79899 Other long term (current) drug therapy: Secondary | ICD-10-CM | POA: Diagnosis not present

## 2020-12-27 DIAGNOSIS — F10930 Alcohol use, unspecified with withdrawal, uncomplicated: Secondary | ICD-10-CM

## 2020-12-27 DIAGNOSIS — R1013 Epigastric pain: Secondary | ICD-10-CM | POA: Diagnosis not present

## 2020-12-27 DIAGNOSIS — J449 Chronic obstructive pulmonary disease, unspecified: Secondary | ICD-10-CM | POA: Diagnosis present

## 2020-12-27 DIAGNOSIS — F17210 Nicotine dependence, cigarettes, uncomplicated: Secondary | ICD-10-CM | POA: Diagnosis present

## 2020-12-27 DIAGNOSIS — B952 Enterococcus as the cause of diseases classified elsewhere: Secondary | ICD-10-CM | POA: Diagnosis present

## 2020-12-27 DIAGNOSIS — Z888 Allergy status to other drugs, medicaments and biological substances status: Secondary | ICD-10-CM | POA: Diagnosis not present

## 2020-12-27 DIAGNOSIS — E538 Deficiency of other specified B group vitamins: Secondary | ICD-10-CM | POA: Diagnosis present

## 2020-12-27 DIAGNOSIS — Z20822 Contact with and (suspected) exposure to covid-19: Secondary | ICD-10-CM | POA: Diagnosis not present

## 2020-12-27 DIAGNOSIS — K219 Gastro-esophageal reflux disease without esophagitis: Secondary | ICD-10-CM | POA: Diagnosis present

## 2020-12-27 DIAGNOSIS — F17200 Nicotine dependence, unspecified, uncomplicated: Secondary | ICD-10-CM

## 2020-12-27 DIAGNOSIS — F419 Anxiety disorder, unspecified: Secondary | ICD-10-CM

## 2020-12-27 DIAGNOSIS — Y900 Blood alcohol level of less than 20 mg/100 ml: Secondary | ICD-10-CM | POA: Diagnosis present

## 2020-12-27 DIAGNOSIS — J9811 Atelectasis: Secondary | ICD-10-CM | POA: Diagnosis not present

## 2020-12-27 DIAGNOSIS — F10139 Alcohol abuse with withdrawal, unspecified: Secondary | ICD-10-CM | POA: Diagnosis present

## 2020-12-27 DIAGNOSIS — F411 Generalized anxiety disorder: Secondary | ICD-10-CM | POA: Diagnosis present

## 2020-12-27 DIAGNOSIS — E871 Hypo-osmolality and hyponatremia: Secondary | ICD-10-CM | POA: Diagnosis not present

## 2020-12-27 DIAGNOSIS — F1010 Alcohol abuse, uncomplicated: Secondary | ICD-10-CM | POA: Diagnosis not present

## 2020-12-27 DIAGNOSIS — F10939 Alcohol use, unspecified with withdrawal, unspecified: Secondary | ICD-10-CM

## 2020-12-27 DIAGNOSIS — R0602 Shortness of breath: Secondary | ICD-10-CM | POA: Diagnosis not present

## 2020-12-27 DIAGNOSIS — N390 Urinary tract infection, site not specified: Secondary | ICD-10-CM | POA: Diagnosis present

## 2020-12-27 DIAGNOSIS — J439 Emphysema, unspecified: Secondary | ICD-10-CM | POA: Diagnosis not present

## 2020-12-27 DIAGNOSIS — F10288 Alcohol dependence with other alcohol-induced disorder: Secondary | ICD-10-CM | POA: Diagnosis not present

## 2020-12-27 DIAGNOSIS — R109 Unspecified abdominal pain: Secondary | ICD-10-CM | POA: Diagnosis not present

## 2020-12-27 DIAGNOSIS — E876 Hypokalemia: Secondary | ICD-10-CM | POA: Diagnosis not present

## 2020-12-27 DIAGNOSIS — R112 Nausea with vomiting, unspecified: Secondary | ICD-10-CM | POA: Diagnosis not present

## 2020-12-27 DIAGNOSIS — R0689 Other abnormalities of breathing: Secondary | ICD-10-CM | POA: Diagnosis not present

## 2020-12-27 DIAGNOSIS — E860 Dehydration: Secondary | ICD-10-CM | POA: Diagnosis not present

## 2020-12-27 DIAGNOSIS — R110 Nausea: Secondary | ICD-10-CM | POA: Diagnosis not present

## 2020-12-27 LAB — URINALYSIS, COMPLETE (UACMP) WITH MICROSCOPIC
Bilirubin Urine: NEGATIVE
Glucose, UA: NEGATIVE mg/dL
Ketones, ur: 5 mg/dL — AB
Nitrite: NEGATIVE
Protein, ur: 100 mg/dL — AB
RBC / HPF: >50 RBC/hpf — ABNORMAL HIGH (ref 0–5)
Specific Gravity, Urine: 1.015 (ref 1.005–1.030)
WBC, UA: >50 WBC/hpf — ABNORMAL HIGH (ref 0–5)
pH: 5 (ref 5.0–8.0)

## 2020-12-27 LAB — COMPREHENSIVE METABOLIC PANEL
ALT: 15 U/L (ref 0–44)
ALT: 15 U/L (ref 0–44)
AST: 22 U/L (ref 15–41)
AST: 20 U/L (ref 15–41)
Albumin: 4.2 g/dL (ref 3.5–5.0)
Albumin: 3.9 g/dL (ref 3.5–5.0)
Alkaline Phosphatase: 36 U/L — ABNORMAL LOW (ref 38–126)
Alkaline Phosphatase: 32 U/L — ABNORMAL LOW (ref 38–126)
Anion gap: 13 (ref 5–15)
Anion gap: 12 (ref 5–15)
BUN: 18 mg/dL (ref 8–23)
BUN: 20 mg/dL (ref 8–23)
CO2: 25 mmol/L (ref 22–32)
CO2: 25 mmol/L (ref 22–32)
Calcium: 9.2 mg/dL (ref 8.9–10.3)
Calcium: 9 mg/dL (ref 8.9–10.3)
Chloride: 79 mmol/L — ABNORMAL LOW (ref 98–111)
Chloride: 82 mmol/L — ABNORMAL LOW (ref 98–111)
Creatinine, Ser: 1.17 mg/dL (ref 0.61–1.24)
Creatinine, Ser: 1.17 mg/dL (ref 0.61–1.24)
GFR, Estimated: >60 mL/min (ref >60)
GFR, Estimated: >60 mL/min (ref >60)
Glucose, Bld: 104 mg/dL — ABNORMAL HIGH (ref 70–99)
Glucose, Bld: 91 mg/dL (ref 70–99)
Potassium: 3.7 mmol/L (ref 3.5–5.1)
Potassium: 3.4 mmol/L — ABNORMAL LOW (ref 3.5–5.1)
Sodium: 117 mmol/L — CL (ref 135–145)
Sodium: 119 mmol/L — CL (ref 135–145)
Total Bilirubin: 1.2 mg/dL (ref 0.3–1.2)
Total Bilirubin: 1.5 mg/dL — ABNORMAL HIGH (ref 0.3–1.2)
Total Protein: 7.1 g/dL (ref 6.5–8.1)
Total Protein: 6.6 g/dL (ref 6.5–8.1)

## 2020-12-27 LAB — BASIC METABOLIC PANEL
Anion gap: 8 (ref 5–15)
Anion gap: 11 (ref 5–15)
Anion gap: 9 (ref 5–15)
BUN: 21 mg/dL (ref 8–23)
BUN: 20 mg/dL (ref 8–23)
BUN: 20 mg/dL (ref 8–23)
CO2: 27 mmol/L (ref 22–32)
CO2: 26 mmol/L (ref 22–32)
CO2: 26 mmol/L (ref 22–32)
Calcium: 8.5 mg/dL — ABNORMAL LOW (ref 8.9–10.3)
Calcium: 8.1 mg/dL — ABNORMAL LOW (ref 8.9–10.3)
Calcium: 8.3 mg/dL — ABNORMAL LOW (ref 8.9–10.3)
Chloride: 83 mmol/L — ABNORMAL LOW (ref 98–111)
Chloride: 80 mmol/L — ABNORMAL LOW (ref 98–111)
Chloride: 83 mmol/L — ABNORMAL LOW (ref 98–111)
Creatinine, Ser: 1.2 mg/dL (ref 0.61–1.24)
Creatinine, Ser: 1.21 mg/dL (ref 0.61–1.24)
Creatinine, Ser: 1.24 mg/dL (ref 0.61–1.24)
GFR, Estimated: >60 mL/min (ref >60)
GFR, Estimated: >60 mL/min (ref >60)
GFR, Estimated: >60 mL/min (ref >60)
Glucose, Bld: 86 mg/dL (ref 70–99)
Glucose, Bld: 84 mg/dL (ref 70–99)
Glucose, Bld: 86 mg/dL (ref 70–99)
Potassium: 3.8 mmol/L (ref 3.5–5.1)
Potassium: 3.3 mmol/L — ABNORMAL LOW (ref 3.5–5.1)
Potassium: 3.5 mmol/L (ref 3.5–5.1)
Sodium: 118 mmol/L — CL (ref 135–145)
Sodium: 117 mmol/L — CL (ref 135–145)
Sodium: 118 mmol/L — CL (ref 135–145)

## 2020-12-27 LAB — RESP PANEL BY RT-PCR (FLU A&B, COVID) ARPGX2
Influenza A by PCR: NEGATIVE
Influenza B by PCR: NEGATIVE
SARS Coronavirus 2 by RT PCR: NEGATIVE

## 2020-12-27 LAB — CBC WITH DIFFERENTIAL/PLATELET
Abs Immature Granulocytes: 0.07 10*3/uL (ref 0.00–0.07)
Basophils Absolute: 0 10*3/uL (ref 0.0–0.1)
Basophils Relative: 0 %
Eosinophils Absolute: 0 10*3/uL (ref 0.0–0.5)
Eosinophils Relative: 0 %
HCT: 40.7 % (ref 39.0–52.0)
Hemoglobin: 14.9 g/dL (ref 13.0–17.0)
Immature Granulocytes: 1 %
Lymphocytes Relative: 14 %
Lymphs Abs: 1.6 10*3/uL (ref 0.7–4.0)
MCH: 34.1 pg — ABNORMAL HIGH (ref 26.0–34.0)
MCHC: 36.6 g/dL — ABNORMAL HIGH (ref 30.0–36.0)
MCV: 93.1 fL (ref 80.0–100.0)
Monocytes Absolute: 1.1 10*3/uL — ABNORMAL HIGH (ref 0.1–1.0)
Monocytes Relative: 10 %
Neutro Abs: 8.3 10*3/uL — ABNORMAL HIGH (ref 1.7–7.7)
Neutrophils Relative %: 75 %
Platelets: 280 10*3/uL (ref 150–400)
RBC: 4.37 MIL/uL (ref 4.22–5.81)
RDW: 11.9 % (ref 11.5–15.5)
WBC: 11 10*3/uL — ABNORMAL HIGH (ref 4.0–10.5)
nRBC: 0 % (ref 0.0–0.2)

## 2020-12-27 LAB — RAPID URINE DRUG SCREEN, HOSP PERFORMED
Amphetamines: NOT DETECTED
Barbiturates: NOT DETECTED
Benzodiazepines: NOT DETECTED
Cocaine: NOT DETECTED
Opiates: NOT DETECTED
Tetrahydrocannabinol: NOT DETECTED

## 2020-12-27 LAB — OSMOLALITY: Osmolality: 250 mOsm/kg — ABNORMAL LOW (ref 275–295)

## 2020-12-27 LAB — PROTIME-INR
INR: 1 (ref 0.8–1.2)
Prothrombin Time: 12.6 seconds (ref 11.4–15.2)

## 2020-12-27 LAB — PHOSPHORUS
Phosphorus: 4 mg/dL (ref 2.5–4.6)
Phosphorus: 3.9 mg/dL (ref 2.5–4.6)

## 2020-12-27 LAB — TSH: TSH: 1.357 u[IU]/mL (ref 0.350–4.500)

## 2020-12-27 LAB — CBC
HCT: 37.8 % — ABNORMAL LOW (ref 39.0–52.0)
Hemoglobin: 13.8 g/dL (ref 13.0–17.0)
MCH: 34.2 pg — ABNORMAL HIGH (ref 26.0–34.0)
MCHC: 36.5 g/dL — ABNORMAL HIGH (ref 30.0–36.0)
MCV: 93.8 fL (ref 80.0–100.0)
Platelets: 242 10*3/uL (ref 150–400)
RBC: 4.03 MIL/uL — ABNORMAL LOW (ref 4.22–5.81)
RDW: 11.9 % (ref 11.5–15.5)
WBC: 11.7 10*3/uL — ABNORMAL HIGH (ref 4.0–10.5)
nRBC: 0 % (ref 0.0–0.2)

## 2020-12-27 LAB — HIV ANTIBODY (ROUTINE TESTING W REFLEX): HIV Screen 4th Generation wRfx: NONREACTIVE

## 2020-12-27 LAB — VITAMIN B12: Vitamin B-12: 349 pg/mL (ref 180–914)

## 2020-12-27 LAB — FOLATE: Folate: 21.9 ng/mL (ref >5.9)

## 2020-12-27 LAB — CREATININE, URINE, RANDOM: Creatinine, Urine: 165.93 mg/dL

## 2020-12-27 LAB — OSMOLALITY, URINE: Osmolality, Ur: 407 mOsm/kg (ref 300–900)

## 2020-12-27 LAB — SODIUM, URINE, RANDOM: Sodium, Ur: <10 mmol/L

## 2020-12-27 LAB — MAGNESIUM
Magnesium: 1.7 mg/dL (ref 1.7–2.4)
Magnesium: 1.6 mg/dL — ABNORMAL LOW (ref 1.7–2.4)

## 2020-12-27 LAB — ETHANOL: Alcohol, Ethyl (B): <10 mg/dL (ref <10)

## 2020-12-27 LAB — MRSA PCR SCREENING: MRSA by PCR: NEGATIVE

## 2020-12-27 LAB — LIPASE, BLOOD: Lipase: 28 U/L (ref 11–51)

## 2020-12-27 IMAGING — DX DG CHEST 1V PORT
1 series · 1 of 1 positions shown · non-contrast
Comparison: PA and lateral chest [DATE] and single-view of the
chest [DATE].

CLINICAL DATA: Cough and shortness of breath.  History of COPD.

EXAM:
PORTABLE CHEST 1 VIEW

[chest ap]
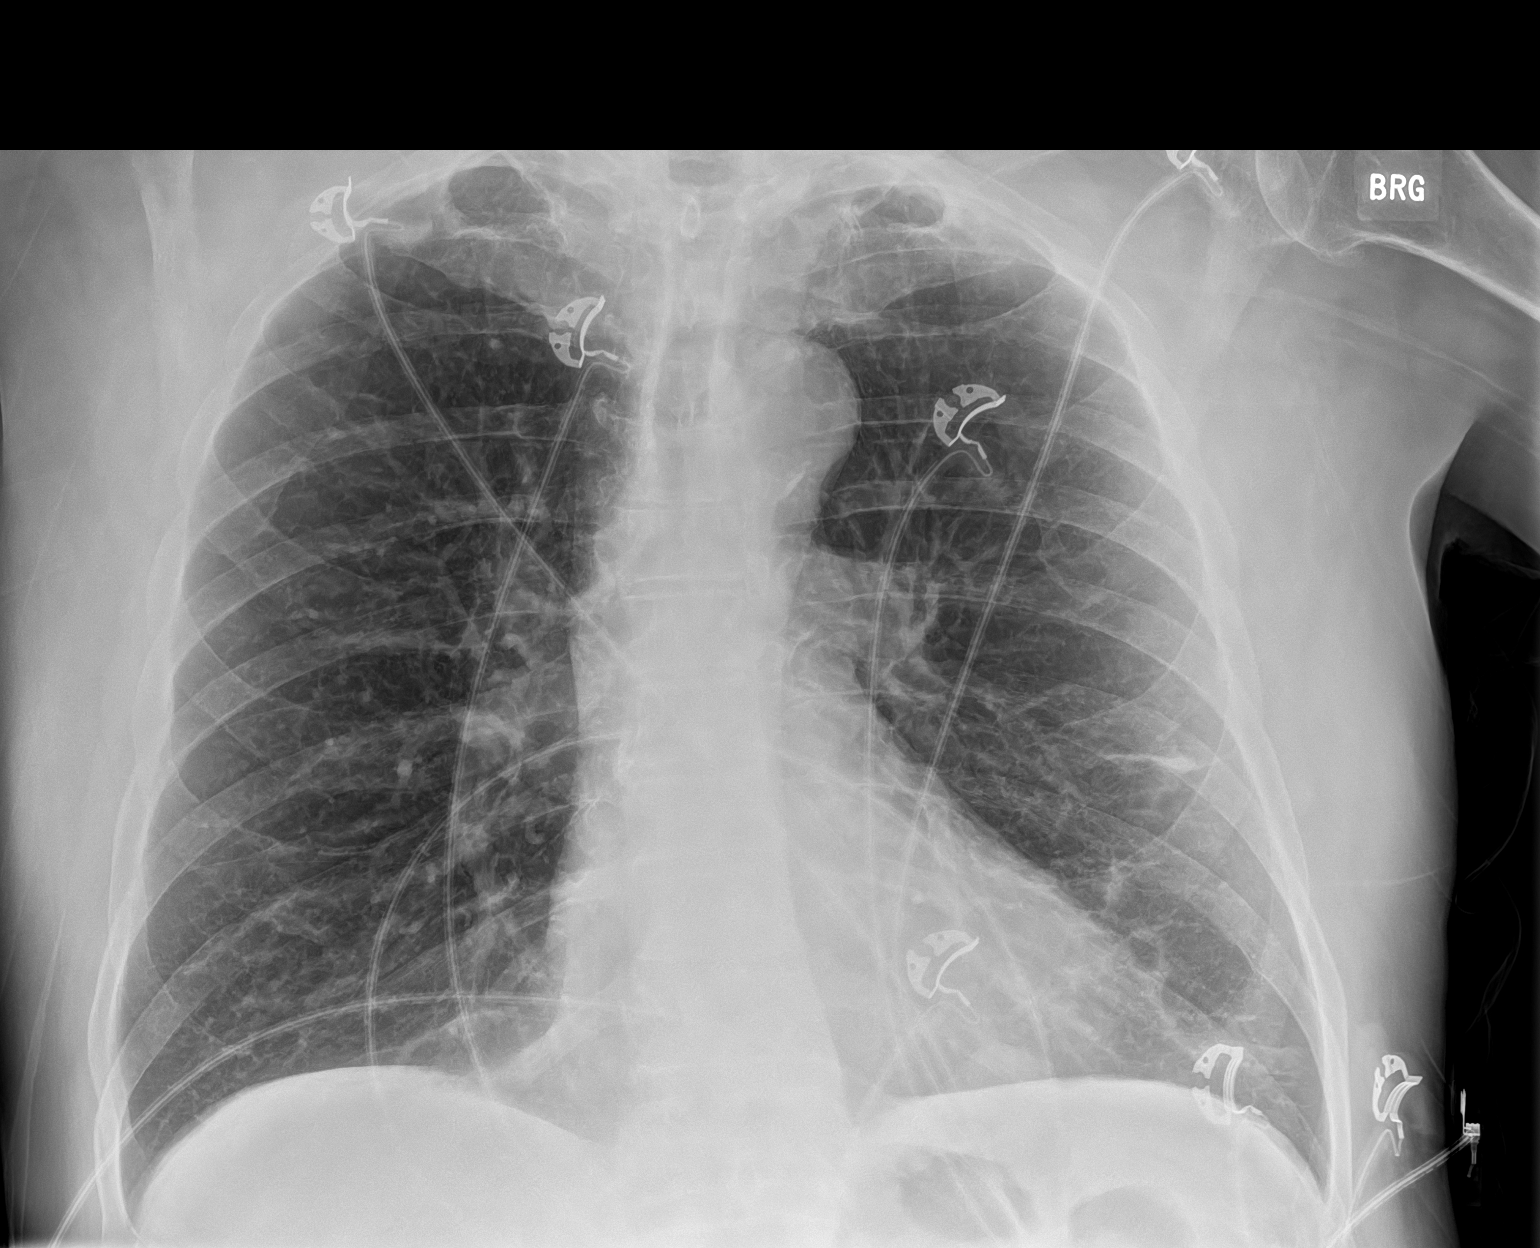

[1 of 1 positions shown; findings below may reference images not displayed]

FINDINGS: The lungs are emphysematous. Small focus of linear atelectasis in
the left mid lung noted. No consolidative process, pneumothorax or
effusion. Heart size is normal. Aortic atherosclerosis noted.
IMPRESSION: No acute disease.

Aortic Atherosclerosis ([8J]-[8J]) and Emphysema ([8J]-[8J]).

## 2020-12-27 IMAGING — US US ABDOMEN LIMITED RUQ/ASCITES
1 series · 14 of 25 positions shown · non-contrast
Comparison: None.

CLINICAL DATA: Abdominal pain

EXAM:
ULTRASOUND ABDOMEN LIMITED RIGHT UPPER QUADRANT

[Series 1: us abdomen limited ruq (liver/gb) · 14 of 67 slices shown]
[im 1/67]
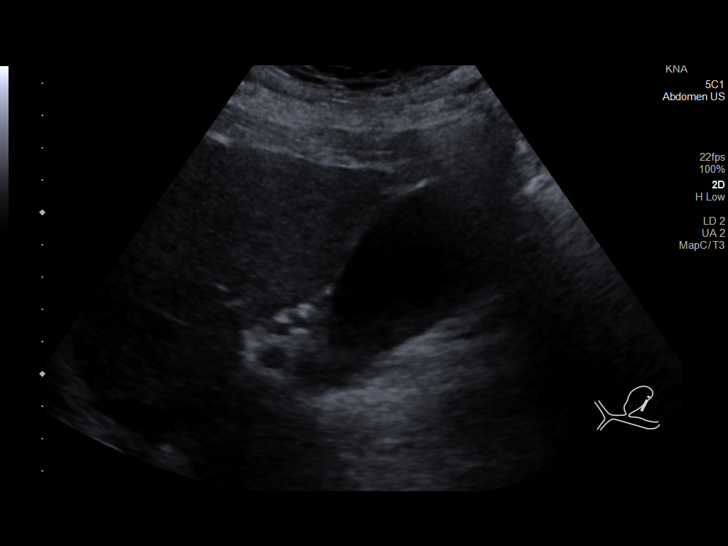
[im 6/67]
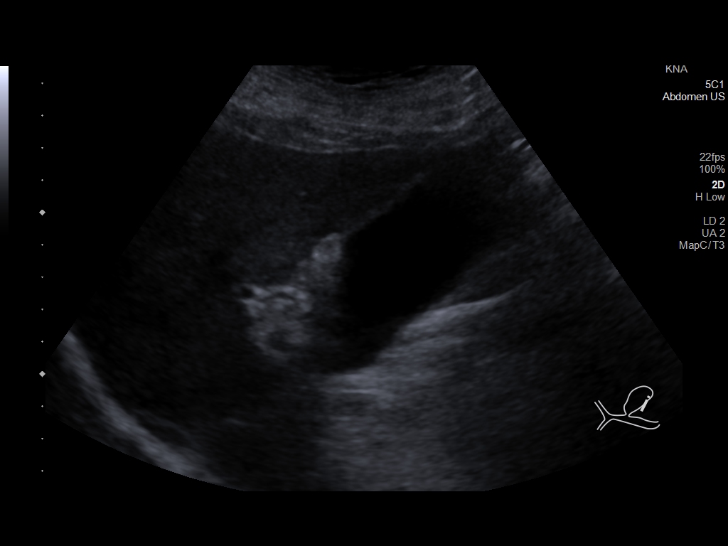
[im 12/67]
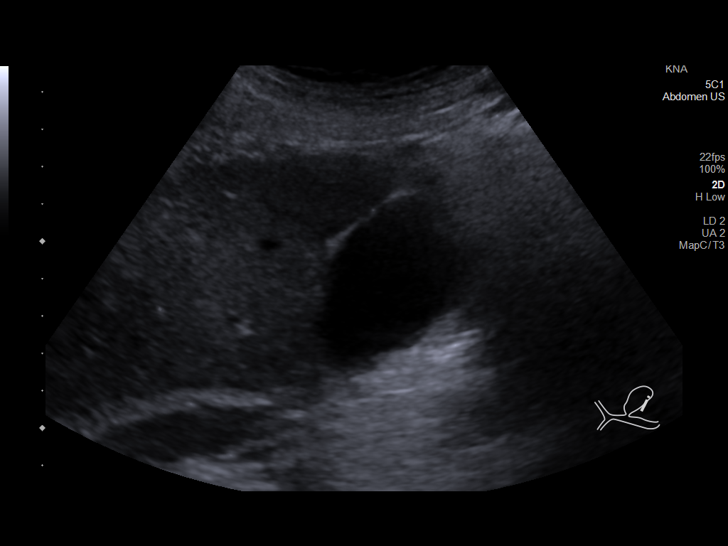
[im 17/67]
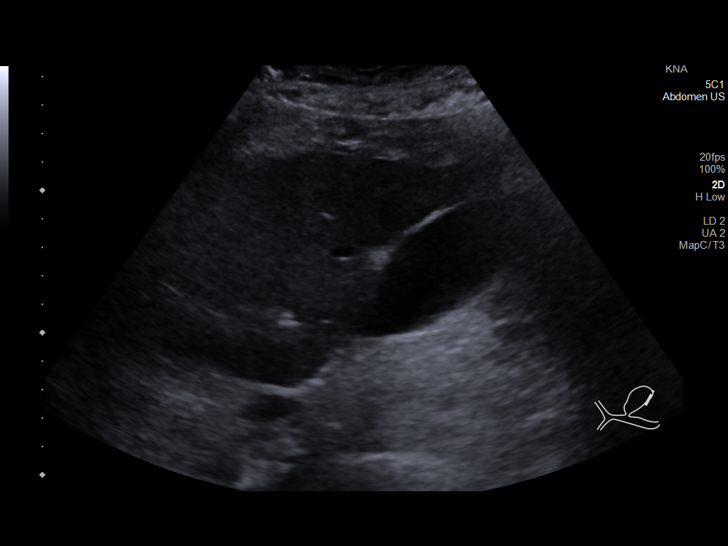
[im 23/67]
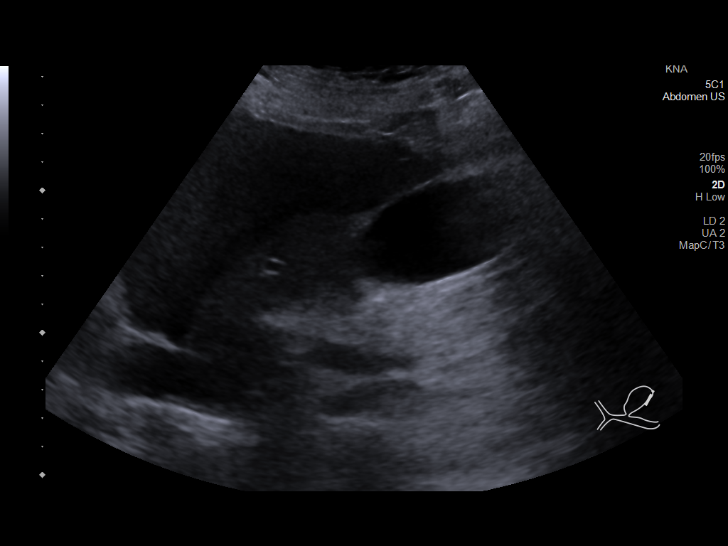
[im 25/67]
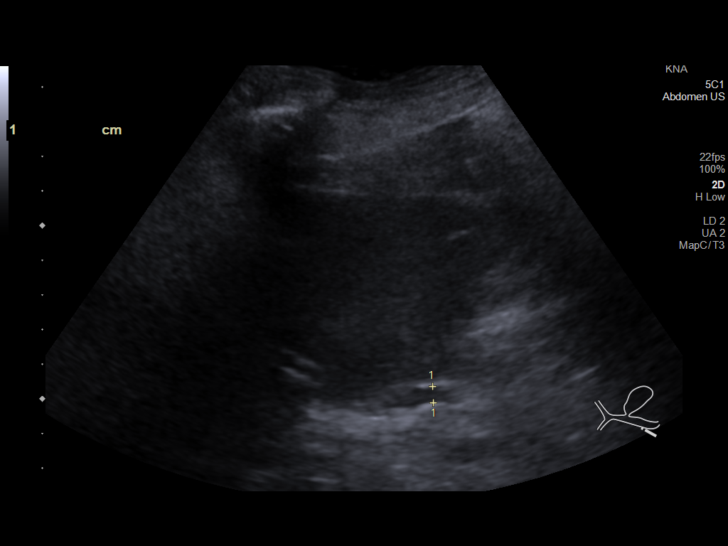
[im 31/67]
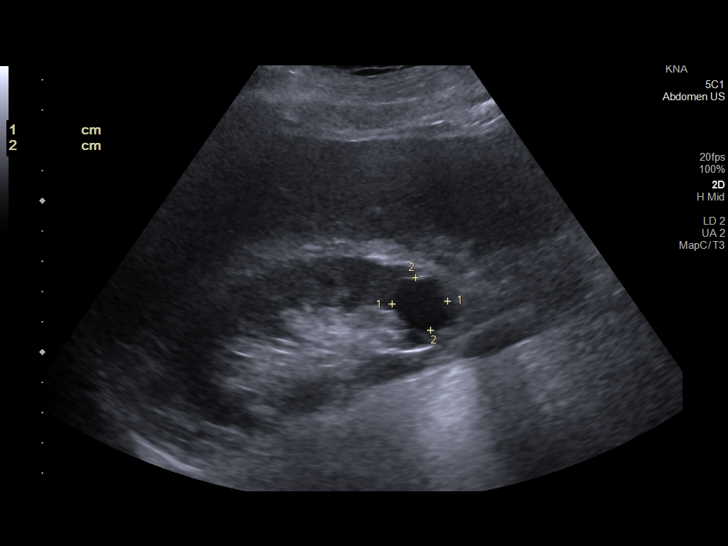
[im 36/67]
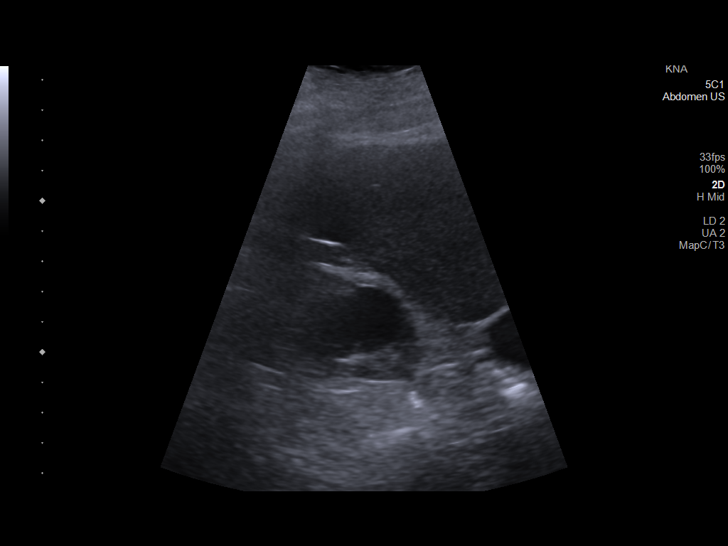
[im 42/67]
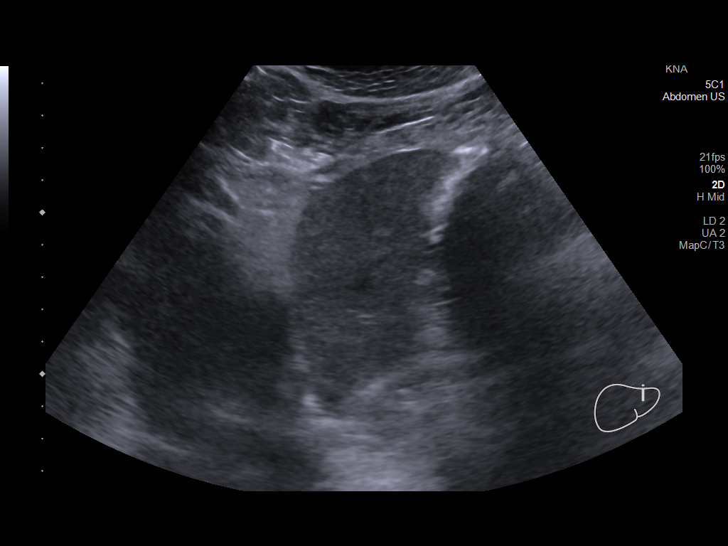
[im 45/67]
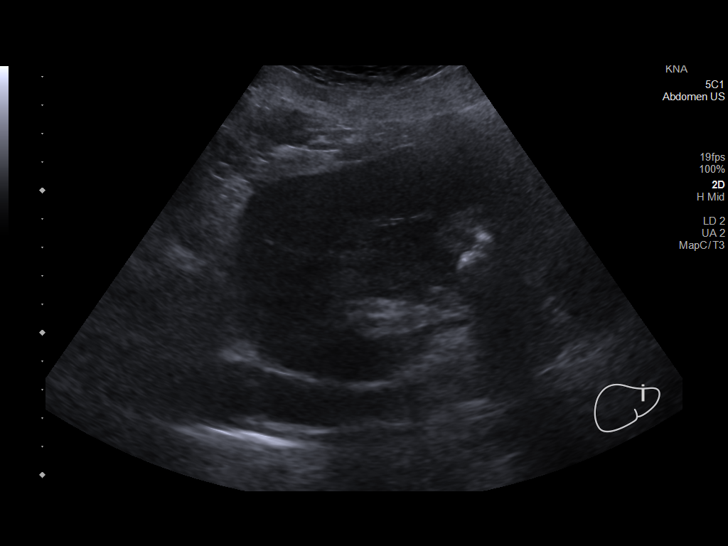
[im 50/67]
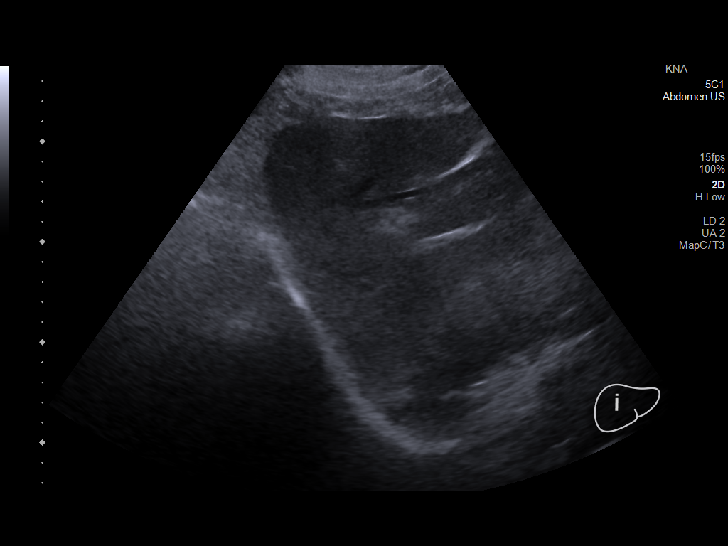
[im 56/67]
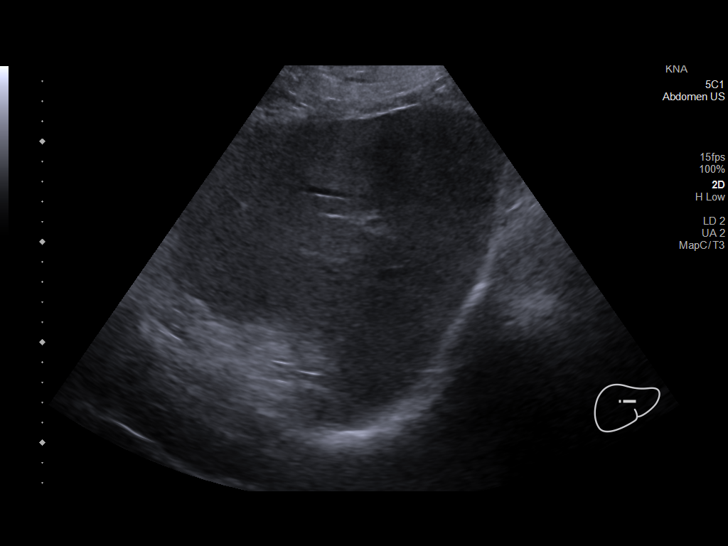
[im 61/67]
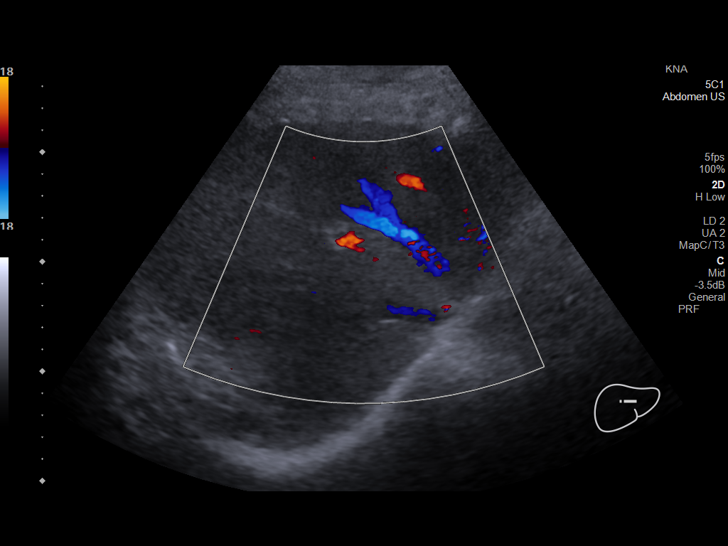
[im 67/67]
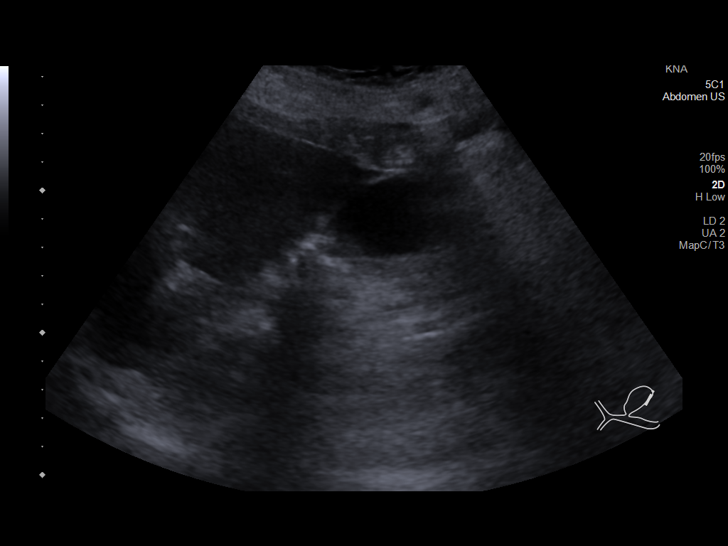

[14 of 25 positions shown; findings below may reference images not displayed]

FINDINGS: Gallbladder:

No gallstones or wall thickening visualized. There is no
pericholecystic fluid. No sonographic Murphy sign noted by
sonographer.

Common bile duct:

Diameter: 5 mm. No intrahepatic or extrahepatic biliary duct
dilatation.

Liver:

No focal lesion identified. Liver contour is subtly nodular. Liver
echogenicity is somewhat coarsened and overall increased. Vein is
patent on color Doppler imaging with normal direction of blood flow
towards the liver.

Other: There is a cyst in the right kidney measuring 1.8 x 1.8 x
cm.
IMPRESSION: The appearance of the liver is felt to be indicative of a degree of
hepatic cirrhosis. No focal liver lesions appreciable by ultrasound.
Cyst arising from right kidney. Study otherwise unremarkable.

## 2020-12-27 MED ORDER — IPRATROPIUM-ALBUTEROL 0.5-2.5 (3) MG/3ML IN SOLN
3.0000 mL | Freq: Three times a day (TID) | RESPIRATORY_TRACT | Status: DC
  Administered 2020-12-28 – 2021-01-02 (×15): 3 mL via RESPIRATORY_TRACT
  Filled 2020-12-27 (×16): qty 3

## 2020-12-27 MED ORDER — IPRATROPIUM-ALBUTEROL 0.5-2.5 (3) MG/3ML IN SOLN
3.0000 mL | Freq: Four times a day (QID) | RESPIRATORY_TRACT | Status: DC
  Administered 2020-12-27 (×2): 3 mL via RESPIRATORY_TRACT
  Filled 2020-12-27 (×2): qty 3

## 2020-12-27 MED ORDER — ONDANSETRON HCL 4 MG PO TABS: 4.0000 mg | ORAL_TABLET | Freq: Four times a day (QID) | ORAL | Status: DC | PRN

## 2020-12-27 MED ORDER — LACTATED RINGERS IV BOLUS
1000.0000 mL | Freq: Once | INTRAVENOUS | Status: AC
  Administered 2020-12-27: 1000 mL via INTRAVENOUS

## 2020-12-27 MED ORDER — LORAZEPAM 1 MG PO TABS
1.0000 mg | ORAL_TABLET | ORAL | Status: AC | PRN
  Administered 2020-12-27 (×2): 1 mg via ORAL
  Administered 2020-12-28 (×2): 2 mg via ORAL
  Administered 2020-12-28: 1 mg via ORAL
  Administered 2020-12-28 – 2020-12-29 (×4): 2 mg via ORAL
  Filled 2020-12-27 (×2): qty 2
  Filled 2020-12-27: qty 3
  Filled 2020-12-27 (×2): qty 1
  Filled 2020-12-27 (×3): qty 2
  Filled 2020-12-27: qty 1
  Filled 2020-12-27: qty 2

## 2020-12-27 MED ORDER — ACETAMINOPHEN 325 MG PO TABS
650.0000 mg | ORAL_TABLET | Freq: Four times a day (QID) | ORAL | Status: DC | PRN
  Administered 2020-12-28 – 2021-01-01 (×4): 650 mg via ORAL
  Filled 2020-12-27 (×5): qty 2

## 2020-12-27 MED ORDER — LORAZEPAM 2 MG/ML IJ SOLN
1.0000 mg | INTRAMUSCULAR | Status: AC | PRN
  Administered 2020-12-28: 3 mg via INTRAVENOUS
  Administered 2020-12-29: 2 mg via INTRAVENOUS
  Administered 2020-12-29: 3 mg via INTRAVENOUS
  Administered 2020-12-29 – 2020-12-30 (×2): 2 mg via INTRAVENOUS
  Filled 2020-12-27 (×3): qty 1
  Filled 2020-12-27: qty 2

## 2020-12-27 MED ORDER — METOPROLOL SUCCINATE ER 50 MG PO TB24
100.0000 mg | ORAL_TABLET | Freq: Every day | ORAL | Status: DC
  Administered 2020-12-27 – 2020-12-29 (×3): 100 mg via ORAL
  Filled 2020-12-27 (×3): qty 2

## 2020-12-27 MED ORDER — POTASSIUM CHLORIDE IN NACL 20-0.9 MEQ/L-% IV SOLN: INTRAVENOUS | Status: AC

## 2020-12-27 MED ORDER — PANTOPRAZOLE SODIUM 40 MG IV SOLR
40.0000 mg | Freq: Two times a day (BID) | INTRAVENOUS | Status: DC
  Administered 2020-12-27 – 2020-12-28 (×3): 40 mg via INTRAVENOUS
  Filled 2020-12-27 (×3): qty 40

## 2020-12-27 MED ORDER — THIAMINE HCL 100 MG PO TABS
100.0000 mg | ORAL_TABLET | Freq: Every day | ORAL | Status: DC
  Administered 2020-12-27 – 2021-01-02 (×6): 100 mg via ORAL
  Filled 2020-12-27 (×6): qty 1

## 2020-12-27 MED ORDER — HYDRALAZINE HCL 20 MG/ML IJ SOLN
10.0000 mg | Freq: Once | INTRAMUSCULAR | Status: AC
  Administered 2020-12-28: 10 mg via INTRAVENOUS
  Filled 2020-12-27: qty 1

## 2020-12-27 MED ORDER — ACETAMINOPHEN 650 MG RE SUPP: 650.0000 mg | Freq: Four times a day (QID) | RECTAL | Status: DC | PRN

## 2020-12-27 MED ORDER — CHLORHEXIDINE GLUCONATE CLOTH 2 % EX PADS
6.0000 | MEDICATED_PAD | Freq: Every day | CUTANEOUS | Status: DC
  Administered 2020-12-27 – 2021-01-02 (×7): 6 via TOPICAL

## 2020-12-27 MED ORDER — ALBUTEROL SULFATE HFA 108 (90 BASE) MCG/ACT IN AERS
1.0000 | INHALATION_SPRAY | RESPIRATORY_TRACT | Status: DC | PRN
  Administered 2020-12-27: 2 via RESPIRATORY_TRACT
  Filled 2020-12-27: qty 6.7

## 2020-12-27 MED ORDER — BUDESONIDE 0.5 MG/2ML IN SUSP
0.5000 mg | Freq: Two times a day (BID) | RESPIRATORY_TRACT | Status: DC
  Administered 2020-12-27 – 2021-01-02 (×12): 0.5 mg via RESPIRATORY_TRACT
  Filled 2020-12-27 (×12): qty 2

## 2020-12-27 MED ORDER — THIAMINE HCL 100 MG/ML IJ SOLN
100.0000 mg | Freq: Every day | INTRAMUSCULAR | Status: DC
  Administered 2020-12-30: 100 mg via INTRAVENOUS
  Filled 2020-12-27: qty 2

## 2020-12-27 MED ORDER — FOLIC ACID 1 MG PO TABS
1.0000 mg | ORAL_TABLET | Freq: Every day | ORAL | Status: DC
  Administered 2020-12-27 – 2021-01-02 (×6): 1 mg via ORAL
  Filled 2020-12-27 (×6): qty 1

## 2020-12-27 MED ORDER — ADULT MULTIVITAMIN W/MINERALS CH
1.0000 | ORAL_TABLET | Freq: Every day | ORAL | Status: DC
  Administered 2020-12-27 – 2021-01-02 (×6): 1 via ORAL
  Filled 2020-12-27 (×6): qty 1

## 2020-12-27 MED ORDER — ENOXAPARIN SODIUM 40 MG/0.4ML ~~LOC~~ SOLN
40.0000 mg | SUBCUTANEOUS | Status: DC
  Administered 2020-12-27 – 2021-01-02 (×7): 40 mg via SUBCUTANEOUS
  Filled 2020-12-27 (×8): qty 0.4

## 2020-12-27 MED ORDER — ONDANSETRON HCL 4 MG/2ML IJ SOLN: 4.0000 mg | Freq: Four times a day (QID) | INTRAMUSCULAR | Status: DC | PRN

## 2020-12-27 NOTE — ED Notes (Signed)
Date and time results received: 12/27/20 & 1055  Test: Sodium Critical Value: 117  Name of Provider Notified: Dr, Alvino Chapel  Orders Received? Or Actions Taken?: Notified

## 2020-12-27 NOTE — ED Triage Notes (Signed)
Coming from home via EMS. Pt likely detoxing from ETOH, hx of same. Reports vomiting and tremors. Last drink 2 days ago and states he drinks aprox 1/2 gallon of vodka per day.

## 2020-12-27 NOTE — ED Notes (Signed)
Blood drawn and taken to lab

## 2020-12-27 NOTE — Progress Notes (Signed)
Date and time results received: 12/27/20 @ 2115   Test:Sodium  Critical Value: 117  Name of Provider Notified: O. Adefeso  Orders Received? Or Actions Taken?: Awaiting orders
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
## 2020-12-27 NOTE — Progress Notes (Signed)
Critical Result called from Lab on 3/29 @ 1420 Patient's sodium level is 119. This is up from admission level of 117.  Patient drinking clear liquids and getting IV fluids.
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
## 2020-12-27 NOTE — Progress Notes (Signed)
Date and time results received: 12/27/20 @ 2348   Test: Sodium  Critical Value: 118  Name of Provider Notified: O. Adefeso   Orders Received? Or Actions Taken?:Awaiting new orders

## 2020-12-28 DIAGNOSIS — F32A Depression, unspecified: Secondary | ICD-10-CM

## 2020-12-28 DIAGNOSIS — F419 Anxiety disorder, unspecified: Secondary | ICD-10-CM

## 2020-12-28 DIAGNOSIS — F10230 Alcohol dependence with withdrawal, uncomplicated: Secondary | ICD-10-CM

## 2020-12-28 DIAGNOSIS — F17200 Nicotine dependence, unspecified, uncomplicated: Secondary | ICD-10-CM

## 2020-12-28 DIAGNOSIS — R1013 Epigastric pain: Secondary | ICD-10-CM

## 2020-12-28 DIAGNOSIS — F1010 Alcohol abuse, uncomplicated: Secondary | ICD-10-CM

## 2020-12-28 DIAGNOSIS — E871 Hypo-osmolality and hyponatremia: Secondary | ICD-10-CM | POA: Diagnosis not present

## 2020-12-28 DIAGNOSIS — E538 Deficiency of other specified B group vitamins: Secondary | ICD-10-CM

## 2020-12-28 DIAGNOSIS — J449 Chronic obstructive pulmonary disease, unspecified: Secondary | ICD-10-CM

## 2020-12-28 LAB — BASIC METABOLIC PANEL
Anion gap: 10 (ref 5–15)
Anion gap: 12 (ref 5–15)
BUN: 18 mg/dL (ref 8–23)
BUN: 18 mg/dL (ref 8–23)
CO2: 23 mmol/L (ref 22–32)
CO2: 25 mmol/L (ref 22–32)
Calcium: 8.4 mg/dL — ABNORMAL LOW (ref 8.9–10.3)
Calcium: 8.5 mg/dL — ABNORMAL LOW (ref 8.9–10.3)
Chloride: 86 mmol/L — ABNORMAL LOW (ref 98–111)
Chloride: 87 mmol/L — ABNORMAL LOW (ref 98–111)
Creatinine, Ser: 1.07 mg/dL (ref 0.61–1.24)
Creatinine, Ser: 1.08 mg/dL (ref 0.61–1.24)
GFR, Estimated: >60 mL/min (ref >60)
GFR, Estimated: >60 mL/min (ref >60)
Glucose, Bld: 78 mg/dL (ref 70–99)
Glucose, Bld: 79 mg/dL (ref 70–99)
Potassium: 3.4 mmol/L — ABNORMAL LOW (ref 3.5–5.1)
Potassium: 3.5 mmol/L (ref 3.5–5.1)
Sodium: 121 mmol/L — ABNORMAL LOW (ref 135–145)
Sodium: 122 mmol/L — ABNORMAL LOW (ref 135–145)

## 2020-12-28 LAB — CBC
HCT: 37.3 % — ABNORMAL LOW (ref 39.0–52.0)
Hemoglobin: 13.2 g/dL (ref 13.0–17.0)
MCH: 34 pg (ref 26.0–34.0)
MCHC: 35.4 g/dL (ref 30.0–36.0)
MCV: 96.1 fL (ref 80.0–100.0)
Platelets: 181 10*3/uL (ref 150–400)
RBC: 3.88 MIL/uL — ABNORMAL LOW (ref 4.22–5.81)
RDW: 12.1 % (ref 11.5–15.5)
WBC: 6.7 10*3/uL (ref 4.0–10.5)
nRBC: 0 % (ref 0.0–0.2)

## 2020-12-28 MED ORDER — PANTOPRAZOLE SODIUM 40 MG PO TBEC
40.0000 mg | DELAYED_RELEASE_TABLET | Freq: Two times a day (BID) | ORAL | Status: DC
  Administered 2020-12-28 – 2020-12-29 (×2): 40 mg via ORAL
  Filled 2020-12-28 (×2): qty 1

## 2020-12-28 MED ORDER — AMLODIPINE BESYLATE 5 MG PO TABS
10.0000 mg | ORAL_TABLET | Freq: Every day | ORAL | Status: DC
  Administered 2020-12-28 – 2020-12-29 (×2): 10 mg via ORAL
  Filled 2020-12-28 (×2): qty 2

## 2020-12-28 MED ORDER — BUPROPION HCL ER (XL) 150 MG PO TB24
300.0000 mg | ORAL_TABLET | Freq: Every day | ORAL | Status: DC
Start: 2020-12-28 — End: 2020-12-30
  Administered 2020-12-28 – 2020-12-29 (×2): 300 mg via ORAL
  Filled 2020-12-28 (×2): qty 2

## 2020-12-28 MED ORDER — QUETIAPINE FUMARATE 25 MG PO TABS
50.0000 mg | ORAL_TABLET | Freq: Every day | ORAL | Status: DC
  Administered 2020-12-28: 50 mg via ORAL
  Filled 2020-12-28 (×2): qty 2

## 2020-12-28 MED ORDER — ENSURE ENLIVE PO LIQD
237.0000 mL | Freq: Two times a day (BID) | ORAL | Status: DC
  Administered 2020-12-28 – 2021-01-02 (×8): 237 mL via ORAL

## 2020-12-28 NOTE — Progress Notes (Signed)
PROGRESS NOTE    Jacob Frank  WIO:[id number] DOB: 12-Jul-1951 DOA: 12/27/2020 PCP: [name], MD   Chief Complaint  Patient presents with  . Alcohol Problem    Brief admission Narrative:  As per H&P written by Dr. Carles Collet on 12/27/20. Jacob Frank is a 70 y.o. male with medical history of hypertension, COPD, tobacco abuse, alcohol abuse, anxiety/depression, B12 deficiency presenting with generalized weakness for the past week.  The patient states that he has had progressive weakness with recurrent nausea and vomiting for the past week.  This has been associated with poor oral intake.  His weakness has progressed to the point where he had difficulty getting out of bed on 12/26/2020.  Unfortunately, he continues to drink half a gallon of vodka on a daily basis.  His last drink was on 12/24/2020.  He continues to smoke cigarettes.  He has a history of 40 pack years.  He denies any fevers, chills, chest pain, hemoptysis.  He has chronic shortness of breath which she states is not any worse than his usual.  He has a nonproductive cough.  There is no hemoptysis.  He has not had any hematemesis.  He denies any abdominal pain or diarrhea.  There is no hematochezia or melena.  He denies any dysuria or hematuria.  He denies any illicit drug use.  Notably, the patient had TURP performed on 11/24/2020. In the emergency department, the patient had low-grade temperature of 99.0 F.  He was hemodynamically stable with oxygen saturation 99% on room air.  BMP showed sodium 117 and serum creatinine 1.17.  LFTs were unremarkable with lipase 28.  WBC 11.0, hemoglobin 14.9, platelets 280,000.  Chest x-ray showed hyperinflation without consolidation.  EKG was sinus rhythm with no ST ST wave changes.  Because of his generalized weakness and low sodium, the patient was admitted for further evaluation.   Assessment & Plan: 1-hyponatremia: -in the setting of dehydration, decrease solute intake, GI loses and volume  depletion. -patient alcohol consumption also playing a role into it -will continue IVF's resuscitation and follow electrolyte trend -no neurologic deficits appreciated -sodium 122 currently -advancing diet  2-hypokalemia -in the setting of poor oral intake and GI loses prior to admission -Mg WNL -will replete K and follow trend -continue monitoring on telemetry for now.  3-HTN -fair control appreciated -cotninue metoprolol and resume amlodipine -continue PRN hydralazine  4-Tobacco dependence -cessation counseling provided -nicotine patch offered   5-COPD -no acute exacerbation appreciated -good O2 sat on RA -continue home bronchodilators and maintenance therapy  6-Vitamin B12 deficiency -resume monthly injection as an outpatient  7-Anxiety and depression -no SI or hallucinations -continue home Wellbutrin  8-Alcohol abuse -no frank withdrawal appreciated currently -patient is contemplating quitting, but not ready yet -continue CIWA protocol -continue thiamine and folic acid  9-GERD -continue PPI  DVT prophylaxis: Lovenox. Code Status: Full Code. Family Communication: no family at bedside. Disposition:   Status is: Inpatient  Dispo: The patient is from: home               Anticipated d/c is to: home with home health services.              Patient currently is not medically stable yet; still with ongoing hyponatremia (improving) and in the process of having diet advanced. Anticipating 1-2 more days prior to discharge.   Difficult to place patient no     Consultants:   None    Procedures:  See below for x-ray  reports.    Antimicrobials:  None    Subjective: No nausea, no vomiting, no CP or abd pain. Will like to have diet advanced. No frank withdrawal. Afebrile, AAOX3  Objective: Vitals:   12/28/20 0745 12/28/20 0800 12/28/20 0900 12/28/20 1000  BP: (!) 167/101 (!) 167/101 (!) 147/90 (!) 146/90  Pulse: 71 74 63 73  Resp: (!) 23 (!) 23 20 19    Temp:      TempSrc:      SpO2: 95% 97% 96% 100%  Weight:      Height:        Intake/Output Summary (Last 24 hours) at 12/28/2020 1051 Last data filed at 12/28/2020 0806 Gross per 24 hour  Intake 1555.63 ml  Output 950 ml  Net 605.63 ml   Filed Weights   12/27/20 0958 12/27/20 1250 12/28/20 0359  Weight: 84.6 kg 85.9 kg 77.9 kg    Examination: General exam: no frank withdrawal appreciated; reporting feeling weak; AAO3 and expressing not nausea or vomiting.  Respiratory system: no crackles, no wheezing, positive rhonchi bilaterally.[name] Respiratory effort normal. Good saturation on RA. Cardiovascular system: S1 & S2 heard, RRR. No JVD, murmurs, rubs, gallops or clicks. No pedal edema. Gastrointestinal system: Abdomen is nondistended, soft and nontender. No organomegaly or masses felt. Normal bowel sounds heard. Central nervous system: Alert and oriented. No focal neurological deficits. Extremities: no cyanosis, no clubbing.  Skin: No petechiae. Psychiatry: Judgement and insight appear normal. Mood & affect appropriate.     Data Reviewed: I have personally reviewed following labs and imaging studies  CBC: Recent Labs  Lab 12/27/20 1020 12/27/20 1310 12/28/20 0321  WBC 11.0* 11.7* 6.7  NEUTROABS 8.3*  --   --   HGB 14.9 13.8 13.2  HCT 40.7 37.8* 37.3*  MCV 93.1 93.8 96.1  PLT 280 242 622    Basic Metabolic Panel: Recent Labs  Lab 12/27/20 1020 12/27/20 1310 12/27/20 1638 12/27/20 2020 12/27/20 2306 12/28/20 0321  NA 117* 119* 118* 117* 118* 122*  121*  K 3.7 3.4* 3.8 3.3* 3.5 3.5  3.4*  CL 79* 82* 83* 80* 83* 87*  86*  CO2 25 25 27 26 26 23  25   GLUCOSE 104* 91 86 84 86 79  78  BUN 18 20 21 20 20 18  18   CREATININE 1.17 1.17 1.20 1.21 1.24 1.07  1.08  CALCIUM 9.2 9.0 8.5* 8.1* 8.3* 8.5*  8.4*  MG 1.7 1.6*  --   --   --   --   PHOS 4.0 3.9  --   --   --   --     GFR: Estimated Creatinine Clearance: 65.5 mL/min (by C-G formula based on SCr of 1.08  mg/dL).  Liver Function Tests: Recent Labs  Lab 12/27/20 1020 12/27/20 1310  AST 22 20  ALT 15 15  ALKPHOS 36* 32*  BILITOT 1.2 1.5*  PROT 7.1 6.6  ALBUMIN 4.2 3.9    Recent Results (from the past 240 hour(s))  Resp Panel by RT-PCR (Flu A&B, Covid) Nasopharyngeal Swab     Status: None   Collection Time: 12/27/20 10:28 AM   Specimen: Nasopharyngeal Swab; Nasopharyngeal(NP) swabs in vial transport medium  Result Value Ref Range Status   SARS Coronavirus 2 by RT PCR NEGATIVE NEGATIVE Final    Comment: (NOTE) SARS-CoV-2 target nucleic acids are NOT DETECTED.  The SARS-CoV-2 RNA is generally detectable in upper respiratory specimens during the acute phase of infection. The lowest concentration of SARS-CoV-2 viral copies this  assay can detect is 138 copies/mL. A negative result does not preclude SARS-Cov-2 infection and should not be used as the sole basis for treatment or other patient management decisions. A negative result may occur with  improper specimen collection/handling, submission of specimen other than nasopharyngeal swab, presence of viral mutation(s) within the areas targeted by this assay, and inadequate number of viral copies(<138 copies/mL). A negative result must be combined with clinical observations, patient history, and epidemiological information. The expected result is Negative.  Fact Sheet for Patients:  EntrepreneurPulse.com.au  Fact Sheet for Healthcare Providers:  IncredibleEmployment.be  This test is no t yet approved or cleared by the [place] FDA and  has been authorized for detection and/or diagnosis of SARS-CoV-2 by FDA under an Emergency Use Authorization (EUA). This EUA will remain  in effect (meaning this test can be used) for the duration of the COVID-19 declaration under Section 564(b)(1) of the Act, 21 U.S.C.section 360bbb-3(b)(1), unless the authorization is terminated  or revoked sooner.        Influenza A by PCR NEGATIVE NEGATIVE Final   Influenza B by PCR NEGATIVE NEGATIVE Final    Comment: (NOTE) The Xpert Xpress SARS-CoV-2/FLU/RSV plus assay is intended as an aid in the diagnosis of influenza from Nasopharyngeal swab specimens and should not be used as a sole basis for treatment. Nasal washings and aspirates are unacceptable for Xpert Xpress SARS-CoV-2/FLU/RSV testing.  Fact Sheet for Patients: EntrepreneurPulse.com.au  Fact Sheet for Healthcare Providers: IncredibleEmployment.be  This test is not yet approved or cleared by the [place] FDA and has been authorized for detection and/or diagnosis of SARS-CoV-2 by FDA under an Emergency Use Authorization (EUA). This EUA will remain in effect (meaning this test can be used) for the duration of the COVID-19 declaration under Section 564(b)(1) of the Act, 21 U.S.C. section 360bbb-3(b)(1), unless the authorization is terminated or revoked.  Performed at [facility], [address] [place], [place] [address]   MRSA PCR Screening     Status: None   Collection Time: 12/27/20 12:55 PM   Specimen: Nasal Mucosa; Nasopharyngeal  Result Value Ref Range Status   MRSA by PCR NEGATIVE NEGATIVE Final    Comment:        The GeneXpert MRSA Assay (FDA approved for NASAL specimens only), is one component of a comprehensive MRSA colonization surveillance program. It is not intended to diagnose MRSA infection nor to guide or monitor treatment for MRSA infections. Performed at [facility], [address], [place], [place] [address]      Radiology Studies: DG Chest Portable 1 View  Result Date: 12/27/2020 CLINICAL DATA:  Cough and shortness of breath.  History of COPD. EXAM: PORTABLE CHEST 1 VIEW COMPARISON:  PA and lateral chest 11/02/2020 and single-view of the chest 03/25/2020. FINDINGS: The lungs are emphysematous. Small focus of linear atelectasis in the left mid lung noted. No  consolidative process, pneumothorax or effusion. Heart size is normal. Aortic atherosclerosis noted. IMPRESSION: No acute disease. Aortic Atherosclerosis (ICD10-I70.0) and Emphysema (ICD10-J43.9). Electronically Signed   By: Inge Rise M.D.   On: 12/27/2020 11:00   US Abdomen Limited RUQ (LIVER/GB)  Result Date: 12/27/2020 CLINICAL DATA:  Abdominal pain EXAM: ULTRASOUND ABDOMEN LIMITED RIGHT UPPER QUADRANT COMPARISON:  None. FINDINGS: Gallbladder: No gallstones or wall thickening visualized. There is no pericholecystic fluid. No sonographic Murphy sign noted by sonographer. Common bile duct: Diameter: 5 mm. No intrahepatic or extrahepatic biliary duct dilatation. Liver: No focal lesion identified. Liver contour is subtly nodular.  Liver echogenicity is somewhat coarsened and overall increased. Vein is patent on color Doppler imaging with normal direction of blood flow towards the liver. Other: There is a cyst in the right kidney measuring 1.8 x 1.8 x 1.8 cm. IMPRESSION: The appearance of the liver is felt to be indicative of a degree of hepatic cirrhosis. No focal liver lesions appreciable by ultrasound. Cyst arising from right kidney. Study otherwise unremarkable. Electronically Signed   By: Lowella Grip III M.D.   On: 12/27/2020 15:24    Scheduled Meds: . amLODipine  10 mg Oral Daily  . budesonide (PULMICORT) nebulizer solution  0.5 mg Nebulization BID  . buPROPion  300 mg Oral Daily  . Chlorhexidine Gluconate Cloth  6 each Topical Daily  . enoxaparin (LOVENOX) injection  40 mg Subcutaneous Q24H  . feeding supplement  237 mL Oral BID BM  . folic acid  1 mg Oral Daily  . ipratropium-albuterol  3 mL Nebulization TID  . metoprolol succinate  100 mg Oral Daily  . multivitamin with minerals  1 tablet Oral Daily  . pantoprazole  40 mg Oral BID  . QUEtiapine  50 mg Oral QHS  . thiamine  100 mg Oral Daily   Or  . thiamine  100 mg Intravenous Daily   Continuous Infusions: . 0.9 % NaCl  with KCl 20 mEq / L 75 mL/hr at 12/28/20 0806     LOS: 1 day    Time spent: 35 minutes    Barton Dubois, MD [facility]   To contact the attending provider between 7A-7P or the covering provider during after hours 7P-7A, please log into the web site [email or web address] and access using universal  password for that web site. If you do not have the password, please call the hospital operator.  12/28/2020, 10:51 AM

## 2020-12-28 NOTE — Plan of Care (Signed)
  Problem: Acute Rehab PT Goals(only PT should resolve) Goal: Pt Will Go Supine/Side To Sit Outcome: Progressing Flowsheets (Taken 12/28/2020 1045) Pt will go Supine/Side to Sit:  Independently  with modified independence Goal: Patient Will Transfer Sit To/From Stand Outcome: Progressing Flowsheets (Taken 12/28/2020 1045) Patient will transfer sit to/from stand:  with modified independence  with supervision Goal: Pt Will Transfer Bed To Chair/Chair To Bed Outcome: Progressing Flowsheets (Taken 12/28/2020 1045) Pt will Transfer Bed to Chair/Chair to Bed:  with modified independence  with supervision Goal: Pt Will Ambulate Outcome: Progressing Flowsheets (Taken 12/28/2020 1045) Pt will Ambulate:  75 feet  with supervision  with cane   10:45 AM, 12/28/20 Lonell Grandchild, MPT Physical Therapist with [facility] [phone] office [phone] mobile phone

## 2020-12-28 NOTE — TOC Initial Note (Signed)
Transition of Care [facility]) - Initial/Assessment Note    Patient Details  Name: Jacob Frank MRN: [id number] Date of Birth: May 19, 1951  Transition of [facility]) CM/SW Contact:    Boneta Lucks, RN Phone Number: 12/28/2020, 4:10 PM  Clinical Narrative:        Patient admitted for Hyponatremia. Lives with a room-mate Insurance claims handler). [employer] drives, patient does not. Patient uses [facility] for appointments. PT recommended HHPT, patient is agreeable.  [facility] accepted the referral. TOC consulted for Substance Abuse. Patient states he drinks half of vodka a day, anytime from morning, night to during the night. He is not interested in any substance abuse. He says AA meetings did not help him. He said he has quit before and his plan is to stop. Patient did take a copy for substance abuse resources.          Expected Discharge Plan: [place] Barriers to Discharge: Continued Medical Work up   Patient Goals and CMS Choice Patient states their goals for this hospitalization and ongoing recovery are:: to go home. CMS Medicare.gov Compare Post Acute Care list provided to:: Patient Choice offered to / list presented to : Patient  Expected Discharge Plan and Services Expected Discharge Plan: [place]      Living arrangements for the past 2 months: Single Family Home                    Time DME Agency Contacted: 42 Representative spoke with at DME Agency: [place]: PT [place]: [facility] [employer] [facility] Date [place]: 12/28/20 Time [place]: 1030 Representative spoke with at [place]: Marjory Lies  Prior Living Arrangements/Services Living arrangements for the past 2 months: [place] with:: Other (Comment) ([place])   Do you feel safe going back to the place where you live?: Yes          Activities of Daily Living Home Assistive Devices/Equipment: None ADL Screening (condition at time of  admission) Patient's cognitive ability adequate to safely complete daily activities?: Yes Is the patient deaf or have difficulty hearing?: No Does the patient have difficulty seeing, even when wearing glasses/contacts?: No Does the patient have difficulty concentrating, remembering, or making decisions?: No Patient able to express need for assistance with ADLs?: Yes Does the patient have difficulty dressing or bathing?: No Independently performs ADLs?: Yes (appropriate for developmental age) Does the patient have difficulty walking or climbing stairs?: No Weakness of Legs: None Weakness of Arms/Hands: None  Permission Sought/Granted     Emotional Assessment     Affect (typically observed): Accepting Orientation: : Oriented to Self,Oriented to Place,Oriented to  Time,Oriented to Situation Alcohol / Substance Use: Alcohol Use Psych Involvement: No (comment)  Admission diagnosis:  Hyponatremia [E87.1] Alcohol withdrawal syndrome without complication ([place]) [F79.024] Alcohol use disorder, severe, dependence ([place]) [F10.20] Patient Active Problem List   Diagnosis Date Noted  . Alcohol withdrawal syndrome without complication ([place])   . BPH with urinary obstruction 11/24/2020  . Unstable gait 06/24/2020  . Imbalance 06/24/2020  . Benign prostatic hyperplasia with urinary obstruction 04/14/2020  . Vitamin D deficiency 03/29/2020  . Urinary retention 03/29/2020  . Refeeding syndrome 03/27/2020  . Hypomagnesemia 03/27/2020  . Hypophosphatemia 03/27/2020  . Acute renal failure (ARF) ([place]) 03/25/2020  . Rhabdomyolysis 03/25/2020  . Leukocytosis 03/25/2020  . Chronic atrial fibrillation ([place]) 03/25/2020  . Atrial fibrillation with RVR ([place])   .  Syncope and collapse 04/24/2019  . Alcohol abuse 04/24/2019  . Anxiety and depression   . Hyponatremia 12/12/2016  . Alcohol abuse with intoxication ([place]) 12/12/2016  . Syncope 12/12/2016  . Elevated PSA 12/02/2013  . Health maintenance  examination 12/01/2013  . Olecranon bursitis of right elbow 04/13/2013  . GAD (generalized anxiety disorder) 03/04/2013  . Prostate cancer screening 05/12/2012  . Gross hematuria 04/23/2012  . Prostatitis 04/23/2012  . COPD (chronic obstructive pulmonary disease) ([place]) 02/06/2012  . HTN (hypertension), benign 02/06/2012  . Tobacco dependence 02/06/2012  . COPD exacerbation ([place]) 02/06/2012  . Vitamin B12 deficiency 02/06/2012   PCP:  [name], MD Pharmacy:   [facility], [place] [address] [place] [address] Phone: [phone] Fax: [place], [place] [address] Phone: [phone] Fax: [phone]  Readmission Risk Interventions Readmission Risk Prevention Plan 12/28/2020  Transportation Screening Complete  Home Care Screening Complete  Medication Review (RN CM) Complete  Some recent data might be hidden

## 2020-12-28 NOTE — Progress Notes (Signed)
Report called to [place], RN Dept. on 300. Patient to be transported via wheelchair with Josephina Gip, [place].

## 2020-12-28 NOTE — Evaluation (Signed)
Physical Therapy Evaluation Patient Details Name: Jacob Frank MRN: [id number] DOB: 1951/06/08 Today's Date: 12/28/2020   History of Present Illness  Jacob Frank is a 70 y.o. male with medical history of hypertension, COPD, tobacco abuse, alcohol abuse, anxiety/depression, B12 deficiency presenting with generalized weakness for the past week.  The patient states that he has had progressive weakness with recurrent nausea and vomiting for the past week.  This has been associated with poor oral intake.  His weakness has progressed to the point where he had difficulty getting out of bed on 12/26/2020.  Unfortunately, he continues to drink half a gallon of vodka on a daily basis.  His last drink was on 12/24/2020.  He continues to smoke cigarettes.  He has a history of 40 pack years.  He denies any fevers, chills, chest pain, hemoptysis.  He has chronic shortness of breath which she states is not any worse than his usual.  He has a nonproductive cough.  There is no hemoptysis.  He has not had any hematemesis.  He denies any abdominal pain or diarrhea.  There is no hematochezia or melena.  He denies any dysuria or hematuria.  He denies any illicit drug use.  Notably, the patient had TURP performed on 11/24/2020.  In the emergency department, the patient had low-grade temperature of 99.0 F.  He was hemodynamically stable with oxygen saturation 99% on room air.  BMP showed sodium 117 and serum creatinine 1.17.  LFTs were unremarkable with lipase 28.  WBC 11.0, hemoglobin 14.9, platelets 280,000.  Chest x-ray showed hyperinflation without consolidation.  EKG was sinus rhythm with no ST ST wave changes.  Because of his generalized weakness and low sodium, the patient was admitted for further evaluation.    Clinical Impression  Patient demonstrates good return for sitting up at bedside and putting on socks, slightly unsteady when taking steps without AD having to lean on nearby objects for support, limited for ambulation  mostly due to c/o fatigue and feeling dizzy.  Patient tolerated sitting up in chair after therapy - RN aware.  Patient will benefit from continued physical therapy in hospital and recommended venue below to increase strength, balance, endurance for safe ADLs and gait.     Follow Up Recommendations Home health PT;Supervision for mobility/OOB;Supervision - Intermittent    Equipment Recommendations  None recommended by PT    Recommendations for Other Services       Precautions / Restrictions Precautions Precautions: Fall Restrictions Weight Bearing Restrictions: No      Mobility  Bed Mobility Overal bed mobility: Modified Independent                  Transfers Overall transfer level: Needs assistance Equipment used: 1 person hand held assist;None Transfers: Stand Pivot Transfers;Sit to/from Stand Sit to Stand: Supervision Stand pivot transfers: Min guard       General transfer comment: increased time with labored movement  Ambulation/Gait Ambulation/Gait assistance: Min guard Gait Distance (Feet): 35 Feet Assistive device: 1 person hand held assist;None Gait Pattern/deviations: Decreased step length - left;Decreased step length - right;Decreased stride length Gait velocity: decreased   General Gait Details: slow labored slightly unsteady cadence without loss of balance, limited mostly due to c/o fatigue and feeling dizzy  Stairs            Wheelchair Mobility    Modified Rankin (Stroke Patients Only)       Balance Overall balance assessment: Needs assistance Sitting-balance support: Feet supported;No upper extremity  supported Sitting balance-Leahy Scale: Good Sitting balance - Comments: seated at EOB   Standing balance support: During functional activity;Single extremity supported Standing balance-Leahy Scale: Fair Standing balance comment: hand held assist                             Pertinent Vitals/Pain Pain Assessment:  No/denies pain    Home Living Family/patient expects to be discharged to:: Private residence Living Arrangements: Non-relatives/Friends Available Help at Discharge: Friend(s);Available PRN/intermittently Type of Home: House Home Access: Stairs to enter Entrance Stairs-Rails: Right;Left (to wide to reach both) Entrance Stairs-Number of Steps: 3-5 Home Layout: One level Home Equipment: Cane - single point      Prior Function Level of Independence: Independent with assistive device(s)         Comments: Hydrographic surveyor using SPC, does not drive     Hand Dominance   Dominant Hand: Right    Extremity/Trunk Assessment   Upper Extremity Assessment Upper Extremity Assessment: Overall WFL for tasks assessed    Lower Extremity Assessment Lower Extremity Assessment: Generalized weakness    Cervical / Trunk Assessment Cervical / Trunk Assessment: Normal  Communication   Communication: No difficulties  Cognition Arousal/Alertness: Awake/alert Behavior During Therapy: WFL for tasks assessed/performed Overall Cognitive Status: Within Functional Limits for tasks assessed                                        General Comments      Exercises     Assessment/Plan    PT Assessment Patient needs continued PT services  PT Problem List Decreased strength;Decreased activity tolerance;Decreased balance;Decreased mobility       PT Treatment Interventions DME instruction;Gait training;Stair training;Functional mobility training;Therapeutic activities;Therapeutic exercise;Patient/family education;Balance training    PT Goals (Current goals can be found in the Care Plan section)  Acute Rehab PT Goals Patient Stated Goal: return home able to take care of self PT Goal Formulation: With patient Time For Goal Achievement: 01/04/21 Potential to Achieve Goals: Good    Frequency Min 3X/week   Barriers to discharge        Co-evaluation                AM-PAC PT "6 Clicks" Mobility  Outcome Measure Help needed turning from your back to your side while in a flat bed without using bedrails?: None Help needed moving from lying on your back to sitting on the side of a flat bed without using bedrails?: None Help needed moving to and from a bed to a chair (including a wheelchair)?: A Little Help needed standing up from a chair using your arms (e.g., wheelchair or bedside chair)?: A Little Help needed to walk in hospital room?: A Little Help needed climbing 3-5 steps with a railing? : A Little 6 Click Score: 20    End of Session   Activity Tolerance: Patient tolerated treatment well;Patient limited by fatigue Patient left: in chair;with call bell/phone within reach;with chair alarm set Nurse Communication: Mobility status PT Visit Diagnosis: Unsteadiness on feet (R26.81);Other abnormalities of gait and mobility (R26.89);Muscle weakness (generalized) (M62.81)    Time: 3532-9924 PT Time Calculation (min) (ACUTE ONLY): 37 min   Charges:   PT Evaluation $PT Eval Moderate Complexity: 1 Mod PT Treatments $Therapeutic Activity: 23-37 mins        10:44 AM, 12/28/20 Lonell Grandchild, MPT Physical Therapist  with [facility] 336 [phone] office [phone] mobile phone

## 2020-12-29 DIAGNOSIS — R1013 Epigastric pain: Secondary | ICD-10-CM | POA: Diagnosis not present

## 2020-12-29 DIAGNOSIS — F1010 Alcohol abuse, uncomplicated: Secondary | ICD-10-CM | POA: Diagnosis not present

## 2020-12-29 DIAGNOSIS — F10230 Alcohol dependence with withdrawal, uncomplicated: Secondary | ICD-10-CM | POA: Diagnosis not present

## 2020-12-29 DIAGNOSIS — E871 Hypo-osmolality and hyponatremia: Secondary | ICD-10-CM | POA: Diagnosis not present

## 2020-12-29 LAB — URINE CULTURE: Culture: >=100000 — AB

## 2020-12-29 LAB — BASIC METABOLIC PANEL
Anion gap: 11 (ref 5–15)
BUN: 20 mg/dL (ref 8–23)
CO2: 25 mmol/L (ref 22–32)
Calcium: 8.5 mg/dL — ABNORMAL LOW (ref 8.9–10.3)
Chloride: 92 mmol/L — ABNORMAL LOW (ref 98–111)
Creatinine, Ser: 1.04 mg/dL (ref 0.61–1.24)
GFR, Estimated: >60 mL/min (ref >60)
Glucose, Bld: 85 mg/dL (ref 70–99)
Potassium: 3.2 mmol/L — ABNORMAL LOW (ref 3.5–5.1)
Sodium: 128 mmol/L — ABNORMAL LOW (ref 135–145)

## 2020-12-29 LAB — MAGNESIUM: Magnesium: 1.7 mg/dL (ref 1.7–2.4)

## 2020-12-29 MED ORDER — SODIUM CHLORIDE 0.9 % IV SOLN
1000.0000 mL | Freq: Once | INTRAVENOUS | Status: AC
  Administered 2020-12-29: 1000 mL via INTRAVENOUS

## 2020-12-29 MED ORDER — POTASSIUM CHLORIDE CRYS ER 20 MEQ PO TBCR
40.0000 meq | EXTENDED_RELEASE_TABLET | Freq: Every day | ORAL | Status: DC
  Administered 2020-12-29: 40 meq via ORAL
  Filled 2020-12-29: qty 2

## 2020-12-29 MED ORDER — DEXMEDETOMIDINE HCL IN NACL 400 MCG/100ML IV SOLN
0.4000 ug/kg/h | INTRAVENOUS | Status: DC
  Administered 2020-12-29: 1 ug/kg/h via INTRAVENOUS
  Administered 2020-12-29: 1.2 ug/kg/h via INTRAVENOUS
  Administered 2020-12-29: 0.4 ug/kg/h via INTRAVENOUS
  Administered 2020-12-30 (×3): 1.2 ug/kg/h via INTRAVENOUS
  Filled 2020-12-29 (×5): qty 100

## 2020-12-29 MED ORDER — HALOPERIDOL LACTATE 5 MG/ML IJ SOLN
5.0000 mg | Freq: Once | INTRAMUSCULAR | Status: AC
  Administered 2020-12-29: 5 mg via INTRAVENOUS
  Filled 2020-12-29: qty 1

## 2020-12-29 NOTE — Progress Notes (Signed)
Patient has been confused and increasingly agitated as shift has progressed.  Patient is not oriented to situation, only to self.  Patient is non-directable and has attempted several times to get out of bed without assistance.  Patient is on CIWA protocol and has been followed appropriately.  MD notified of patient behaviors.

## 2020-12-29 NOTE — Progress Notes (Signed)
PROGRESS NOTE    DELVONTE Frank  ZJQ:[id number] DOB: 1950/12/27 DOA: 12/27/2020 PCP: [name], MD   Chief Complaint  Patient presents with  . Alcohol Problem    Brief admission Narrative:  As per H&P written by Dr. Carles Collet on 12/27/20. Jacob Frank is a 70 y.o. male with medical history of hypertension, COPD, tobacco abuse, alcohol abuse, anxiety/depression, B12 deficiency presenting with generalized weakness for the past week.  The patient states that he has had progressive weakness with recurrent nausea and vomiting for the past week.  This has been associated with poor oral intake.  His weakness has progressed to the point where he had difficulty getting out of bed on 12/26/2020.  Unfortunately, he continues to drink half a gallon of vodka on a daily basis.  His last drink was on 12/24/2020.  He continues to smoke cigarettes.  He has a history of 40 pack years.  He denies any fevers, chills, chest pain, hemoptysis.  He has chronic shortness of breath which she states is not any worse than his usual.  He has a nonproductive cough.  There is no hemoptysis.  He has not had any hematemesis.  He denies any abdominal pain or diarrhea.  There is no hematochezia or melena.  He denies any dysuria or hematuria.  He denies any illicit drug use.  Notably, the patient had TURP performed on 11/24/2020. In the emergency department, the patient had low-grade temperature of 99.0 F.  He was hemodynamically stable with oxygen saturation 99% on room air.  BMP showed sodium 117 and serum creatinine 1.17.  LFTs were unremarkable with lipase 28.  WBC 11.0, hemoglobin 14.9, platelets 280,000.  Chest x-ray showed hyperinflation without consolidation.  EKG was sinus rhythm with no ST ST wave changes.  Because of his generalized weakness and low sodium, the patient was admitted for further evaluation.   Assessment & Plan: 1-hyponatremia: -in the setting of dehydration, decrease solute intake, GI loses and volume  depletion. -patient alcohol consumption also playing a role into it -will continue IVF's resuscitation and follow electrolyte trend -no neurologic deficits appreciated -sodium 128 currently  2-hypokalemia -in the setting of poor oral intake and GI loses prior to admission -Mg WNL -will continue potassium repletion and follow trend  -continue monitoring on telemetry for now.  3-HTN -fair control appreciated -cotninue metoprolol and resume amlodipine -continue PRN hydralazine  4-Tobacco dependence -cessation counseling provided -nicotine patch offered   5-COPD -no acute exacerbation appreciated -good O2 sat on RA -continue home bronchodilators and maintenance therapy  6-Vitamin B12 deficiency -resume monthly injection as an outpatient  7-Anxiety and depression -no SI or hallucinations -continue home Wellbutrin  8-Alcohol abuse -no Frank withdrawal appreciated currently -patient is contemplating quitting, but not ready yet -started on precedex  -continue thiamine and folic acid -follow CIWA score  9-GERD -continue PPI  10-enterococcus UTI -continue rocephin  -follow sensitivity   DVT prophylaxis: Lovenox. Code Status: Full Code. Family Communication: no family at bedside. Disposition:   Status is: Inpatient  Dispo: The patient is from: home               Anticipated d/c is to: home with home health services.              Patient currently is not medically stable yet; still with ongoing hyponatremia (improving) and in the process of having diet advanced. Anticipating 2 more days prior to discharge; currently requiring Precedex.   Difficult to place patient no  Consultants:   None    Procedures:  See below for x-ray reports.    Antimicrobials:  None    Subjective: No nausea, no vomiting, no chest pain currently afebrile.  Continue to be intermittent disoriented, confused and experiencing trouble with redirections.  Has required to be started on  Precedex.  Objective: Vitals:   12/29/20 1500 12/29/20 1508 12/29/20 1600 12/29/20 1609  BP:   (!) 69/53   Pulse: 68  63 67  Resp: (!) 26  (!) 22 16  Temp:    (!) 97.2 F (36.2 C)  TempSrc:    Axillary  SpO2: 91% 91% 94% 94%  Weight:      Height:       No intake or output data in the 24 hours ending 12/29/20 1727 Filed Weights   12/27/20 1250 12/28/20 0359 12/29/20 0817  Weight: 85.9 kg 77.9 kg 79.8 kg    Examination: General exam: Alert, awake, oriented x 1; with ongoing disorientation, confusion and lack of being able to be redirected.  Patient is afebrile.  No nausea, vomiting. Respiratory system: Good air movement bilaterally; no wheezing or crackles.  Positive scattered rhonchi appreciated.  No using accessory muscles. Cardiovascular system:RRR. No murmurs, rubs, gallops.  No JVD located Gastrointestinal system: Abdomen is nondistended, soft and nontender. No organomegaly or masses felt. Normal bowel sounds heard. Central nervous system: Alert and oriented. No focal neurological deficits. Extremities: No cyanosis or clubbing. Skin: No petechiae. Psychiatry: Judgement and insight appear impaired in the setting of alcohol withdrawal.  No combativeness currently..    Data Reviewed: I have personally reviewed following labs and imaging studies  CBC: Recent Labs  Lab 12/27/20 1020 12/27/20 1310 12/28/20 0321  WBC 11.0* 11.7* 6.7  NEUTROABS 8.3*  --   --   HGB 14.9 13.8 13.2  HCT 40.7 37.8* 37.3*  MCV 93.1 93.8 96.1  PLT 280 242 948    Basic Metabolic Panel: Recent Labs  Lab 12/27/20 1020 12/27/20 1310 12/27/20 1638 12/27/20 2020 12/27/20 2306 12/28/20 0321 12/29/20 0529  NA 117* 119* 118* 117* 118* 122*  121* 128*  K 3.7 3.4* 3.8 3.3* 3.5 3.5  3.4* 3.2*  CL 79* 82* 83* 80* 83* 87*  86* 92*  CO2 25 25 27 26 26 23  25 25   GLUCOSE 104* 91 86 84 86 79  78 85  BUN 18 20 21 20 20 18  18 20   CREATININE 1.17 1.17 1.20 1.21 1.24 1.07  1.08 1.04  CALCIUM  9.2 9.0 8.5* 8.1* 8.3* 8.5*  8.4* 8.5*  MG 1.7 1.6*  --   --   --   --  1.7  PHOS 4.0 3.9  --   --   --   --   --     GFR: Estimated Creatinine Clearance: 68 mL/min (by C-G formula based on SCr of 1.04 mg/dL).  Liver Function Tests: Recent Labs  Lab 12/27/20 1020 12/27/20 1310  AST 22 20  ALT 15 15  ALKPHOS 36* 32*  BILITOT 1.2 1.5*  PROT 7.1 6.6  ALBUMIN 4.2 3.9    Recent Results (from the past 240 hour(s))  Resp Panel by RT-PCR (Flu A&B, Covid) Nasopharyngeal Swab     Status: None   Collection Time: 12/27/20 10:28 AM   Specimen: Nasopharyngeal Swab; Nasopharyngeal(NP) swabs in vial transport medium  Result Value Ref Range Status   SARS Coronavirus 2 by RT PCR NEGATIVE NEGATIVE Final    Comment: (NOTE) SARS-CoV-2 target nucleic acids are  NOT DETECTED.  The SARS-CoV-2 RNA is generally detectable in upper respiratory specimens during the acute phase of infection. The lowest concentration of SARS-CoV-2 viral copies this assay can detect is 138 copies/mL. A negative result does not preclude SARS-Cov-2 infection and should not be used as the sole basis for treatment or other patient management decisions. A negative result may occur with  improper specimen collection/handling, submission of specimen other than nasopharyngeal swab, presence of viral mutation(s) within the areas targeted by this assay, and inadequate number of viral copies(<138 copies/mL). A negative result must be combined with clinical observations, patient history, and epidemiological information. The expected result is Negative.  Fact Sheet for Patients:  EntrepreneurPulse.com.au  Fact Sheet for Healthcare Providers:  IncredibleEmployment.be  This test is no t yet approved or cleared by the [place] FDA and  has been authorized for detection and/or diagnosis of SARS-CoV-2 by FDA under an Emergency Use Authorization (EUA). This EUA will remain  in effect  (meaning this test can be used) for the duration of the COVID-19 declaration under Section 564(b)(1) of the Act, 21 U.S.C.section 360bbb-3(b)(1), unless the authorization is terminated  or revoked sooner.       Influenza A by PCR NEGATIVE NEGATIVE Final   Influenza B by PCR NEGATIVE NEGATIVE Final    Comment: (NOTE) The Xpert Xpress SARS-CoV-2/FLU/RSV plus assay is intended as an aid in the diagnosis of influenza from Nasopharyngeal swab specimens and should not be used as a sole basis for treatment. Nasal washings and aspirates are unacceptable for Xpert Xpress SARS-CoV-2/FLU/RSV testing.  Fact Sheet for Patients: EntrepreneurPulse.com.au  Fact Sheet for Healthcare Providers: IncredibleEmployment.be  This test is not yet approved or cleared by the [place] FDA and has been authorized for detection and/or diagnosis of SARS-CoV-2 by FDA under an Emergency Use Authorization (EUA). This EUA will remain in effect (meaning this test can be used) for the duration of the COVID-19 declaration under Section 564(b)(1) of the Act, 21 U.S.C. section 360bbb-3(b)(1), unless the authorization is terminated or revoked.  Performed at [facility], [address], [place], [place] [address]   MRSA PCR Screening     Status: None   Collection Time: 12/27/20 12:55 PM   Specimen: Nasal Mucosa; Nasopharyngeal  Result Value Ref Range Status   MRSA by PCR NEGATIVE NEGATIVE Final    Comment:        The GeneXpert MRSA Assay (FDA approved for NASAL specimens only), is one component of a comprehensive MRSA colonization surveillance program. It is not intended to diagnose MRSA infection nor to guide or monitor treatment for MRSA infections. Performed at [facility], [address], [place], [place] [address]   Culture, Urine     Status: Abnormal   Collection Time: 12/27/20  1:42 PM   Specimen: Urine, Clean Catch  Result Value Ref Range Status   Specimen  Description   Final    URINE, CLEAN CATCH Performed at [facility], [address], [place], [place] [address]    Special Requests   Final    NONE Performed at [facility], [address], [place], [place] [address]    Culture >=100,000 COLONIES/mL ENTEROCOCCUS FAECALIS (A)  Final   Report Status 12/29/2020 FINAL  Final   Organism ID, Bacteria ENTEROCOCCUS FAECALIS (A)  Final      Susceptibility   Enterococcus faecalis - MIC*    AMPICILLIN <=2 SENSITIVE Sensitive     NITROFURANTOIN <=16 SENSITIVE Sensitive     VANCOMYCIN 1 SENSITIVE Sensitive     * >=  100,000 COLONIES/mL ENTEROCOCCUS FAECALIS     Radiology Studies: No results found.  Scheduled Meds: . amLODipine  10 mg Oral Daily  . budesonide (PULMICORT) nebulizer solution  0.5 mg Nebulization BID  . buPROPion  300 mg Oral Daily  . Chlorhexidine Gluconate Cloth  6 each Topical Daily  . enoxaparin (LOVENOX) injection  40 mg Subcutaneous Q24H  . feeding supplement  237 mL Oral BID BM  . folic acid  1 mg Oral Daily  . ipratropium-albuterol  3 mL Nebulization TID  . metoprolol succinate  100 mg Oral Daily  . multivitamin with minerals  1 tablet Oral Daily  . pantoprazole  40 mg Oral BID  . potassium chloride  40 mEq Oral Daily  . QUEtiapine  50 mg Oral QHS  . thiamine  100 mg Oral Daily   Or  . thiamine  100 mg Intravenous Daily   Continuous Infusions: . dexmedetomidine (PRECEDEX) IV infusion 0.4 mcg/kg/hr (12/29/20 1630)     LOS: 2 days    Time spent: 35 minutes    Barton Dubois, MD [facility]   To contact the attending provider between 7A-7P or the covering provider during after hours 7P-7A, please log into the web site [email or web address] and access using universal [place] password for that web site. If you do not have the password, please call the hospital operator.  12/29/2020, 5:27 PM

## 2020-12-29 NOTE — Progress Notes (Signed)
Patient becoming more agitated and restless. MD notified and order for precedex gtt started. Patient started becoming hypotensive and 1L of NS was ordered. Noted good effect from fluid bolus. Patient remains on Precedex gtt and is tolerating well.

## 2020-12-30 DIAGNOSIS — R1013 Epigastric pain: Secondary | ICD-10-CM | POA: Diagnosis not present

## 2020-12-30 DIAGNOSIS — F10230 Alcohol dependence with withdrawal, uncomplicated: Secondary | ICD-10-CM | POA: Diagnosis not present

## 2020-12-30 DIAGNOSIS — F1010 Alcohol abuse, uncomplicated: Secondary | ICD-10-CM | POA: Diagnosis not present

## 2020-12-30 DIAGNOSIS — E871 Hypo-osmolality and hyponatremia: Secondary | ICD-10-CM | POA: Diagnosis not present

## 2020-12-30 LAB — BASIC METABOLIC PANEL
Anion gap: 7 (ref 5–15)
BUN: 17 mg/dL (ref 8–23)
CO2: 27 mmol/L (ref 22–32)
Calcium: 8.5 mg/dL — ABNORMAL LOW (ref 8.9–10.3)
Chloride: 99 mmol/L (ref 98–111)
Creatinine, Ser: 0.84 mg/dL (ref 0.61–1.24)
GFR, Estimated: >60 mL/min (ref >60)
Glucose, Bld: 113 mg/dL — ABNORMAL HIGH (ref 70–99)
Potassium: 3.8 mmol/L (ref 3.5–5.1)
Sodium: 133 mmol/L — ABNORMAL LOW (ref 135–145)

## 2020-12-30 MED ORDER — CHLORDIAZEPOXIDE HCL 5 MG PO CAPS
10.0000 mg | ORAL_CAPSULE | Freq: Three times a day (TID) | ORAL | Status: DC
  Administered 2020-12-30 – 2021-01-02 (×9): 10 mg via ORAL
  Filled 2020-12-30 (×9): qty 2

## 2020-12-30 MED ORDER — PANTOPRAZOLE SODIUM 40 MG IV SOLR
40.0000 mg | INTRAVENOUS | Status: DC
  Administered 2020-12-30: 40 mg via INTRAVENOUS
  Filled 2020-12-30: qty 40

## 2020-12-30 MED ORDER — METOPROLOL TARTRATE 5 MG/5ML IV SOLN
2.5000 mg | Freq: Three times a day (TID) | INTRAVENOUS | Status: DC
  Administered 2020-12-30 – 2020-12-31 (×3): 2.5 mg via INTRAVENOUS
  Filled 2020-12-30 (×3): qty 5

## 2020-12-30 NOTE — Progress Notes (Signed)
PROGRESS NOTE    Jacob Frank  LPF:[id number] DOB: [birth year] DOA: 12/27/2020 PCP: [name], MD   Chief Complaint  Patient presents with  . Alcohol Problem    Brief admission Narrative:  As per H&P written by Dr. Carles Collet on 12/27/20. Jacob Frank is a 70 y.o. male with medical history of hypertension, COPD, tobacco abuse, alcohol abuse, anxiety/depression, B12 deficiency presenting with generalized weakness for the past week.  The patient states that he has had progressive weakness with recurrent nausea and vomiting for the past week.  This has been associated with poor oral intake.  His weakness has progressed to the point where he had difficulty getting out of bed on 12/26/2020.  Unfortunately, he continues to drink half a gallon of vodka on a daily basis.  His last drink was on 12/24/2020.  He continues to smoke cigarettes.  He has a history of 40 pack years.  He denies any fevers, chills, chest pain, hemoptysis.  He has chronic shortness of breath which she states is not any worse than his usual.  He has a nonproductive cough.  There is no hemoptysis.  He has not had any hematemesis.  He denies any abdominal pain or diarrhea.  There is no hematochezia or melena.  He denies any dysuria or hematuria.  He denies any illicit drug use.  Notably, the patient had TURP performed on 11/24/2020. In the emergency department, the patient had low-grade temperature of 99.0 F.  He was hemodynamically stable with oxygen saturation 99% on room air.  BMP showed sodium 117 and serum creatinine 1.17.  LFTs were unremarkable with lipase 28.  WBC 11.0, hemoglobin 14.9, platelets 280,000.  Chest x-ray showed hyperinflation without consolidation.  EKG was sinus rhythm with no ST ST wave changes.  Because of his generalized weakness and low sodium, the patient was admitted for further evaluation.   Assessment & Plan: 1-hyponatremia: -in the setting of dehydration, decrease solute intake, GI loses and volume  depletion. -patient alcohol consumption also playing a role into it -will continue IVF's resuscitation and follow electrolyte trend -no neurologic deficits appreciated -sodium 133 currently  2-hypokalemia -in the setting of poor oral intake and GI loses prior to admission -Mg WNL -will continue potassium repletion and follow trend  -continue monitoring on telemetry for now.  3-HTN -fair control appreciated -cotninue metoprolol and resume amlodipine -continue PRN hydralazine  4-Tobacco dependence -cessation counseling provided -nicotine patch offered   5-COPD -no acute exacerbation appreciated -good O2 sat on RA -continue home bronchodilators and maintenance therapy  6-Vitamin B12 deficiency -resume monthly injection as an outpatient  7-Anxiety and depression -no SI or hallucinations -continue home Wellbutrin  8-Alcohol abuse -no frank withdrawal appreciated currently -patient is contemplating quitting, but not ready yet -started on precedex  -continue thiamine and folic acid -will add librium and follow response -continue weaning off precedex  9-GERD -continue PPI  10-enterococcus UTI -continue rocephin  -follow sensitivity   DVT prophylaxis: Lovenox. Code Status: Full Code. Family Communication: no family at bedside. Disposition:   Status is: Inpatient  Dispo: The patient is from: home               Anticipated d/c is to: home with home health services.              Patient currently is not medically stable yet; still with ongoing hyponatremia (improving) and in the process of having diet advanced. Anticipating 2 more days prior to discharge; currently requiring Precedex.  Difficult to place patient no     Consultants:   None    Procedures:  See below for x-ray reports.    Antimicrobials:  None    Subjective: No nausea, no vomiting, no chest pain.  Patient is afebrile.  Worsening mentation throughout the night.  Currently obtunded, with  concerns in his ability to tolerate by mouth medications and using Precedex.  Objective: Vitals:   12/30/20 1500 12/30/20 1600 12/30/20 1616 12/30/20 1700  BP:  (!) 151/104  (!) 163/116  Pulse: 87 90  86  Resp:  19  (!) 25  Temp:   (!) 97.3 F (36.3 C) (!) 97.3 F (36.3 C)  TempSrc:   Oral Oral  SpO2: 100% (!) 86%  96%  Weight:      Height:        Intake/Output Summary (Last 24 hours) at 12/30/2020 1728 Last data filed at 12/30/2020 1151 Gross per 24 hour  Intake 1457.6 ml  Output 1550 ml  Net -92.4 ml   Filed Weights   12/27/20 1250 12/28/20 0359 12/29/20 0817  Weight: 85.9 kg 77.9 kg 79.8 kg    Examination: General exam: Afebrile, no nausea, no vomiting.  Very obtunded and with concerns for capability taking PO's. Continue requiring Precedex overnight. Respiratory system: Clear to auscultation. Respiratory effort normal.  No using accessory muscle. Cardiovascular system:RRR. No murmurs, rubs, gallops.  No JVD. Gastrointestinal system: Abdomen is nondistended, soft and nontender. No organomegaly or masses felt. Normal bowel sounds heard. Central nervous system: Unable to properly assess current mentation. Extremities: No cyanosis or clubbing. Skin: No petechiae. Psychiatry: Unable to properly assess current mentation.   Data Reviewed: I have personally reviewed following labs and imaging studies  CBC: Recent Labs  Lab 12/27/20 1020 12/27/20 1310 12/28/20 0321  WBC 11.0* 11.7* 6.7  NEUTROABS 8.3*  --   --   HGB 14.9 13.8 13.2  HCT 40.7 37.8* 37.3*  MCV 93.1 93.8 96.1  PLT 280 242 387    Basic Metabolic Panel: Recent Labs  Lab 12/27/20 1020 12/27/20 1310 12/27/20 1638 12/27/20 2020 12/27/20 2306 12/28/20 0321 12/29/20 0529 12/30/20 0815  NA 117* 119*   < > 117* 118* 122*  121* 128* 133*  K 3.7 3.4*   < > 3.3* 3.5 3.5  3.4* 3.2* 3.8  CL 79* 82*   < > 80* 83* 87*  86* 92* 99  CO2 25 25   < > 26 26 23  25 25 27   GLUCOSE 104* 91   < > 84 86 79  78 85  113*  BUN 18 20   < > 20 20 18  18 20 17   CREATININE 1.17 1.17   < > 1.21 1.24 1.07  1.08 1.04 0.84  CALCIUM 9.2 9.0   < > 8.1* 8.3* 8.5*  8.4* 8.5* 8.5*  MG 1.7 1.6*  --   --   --   --  1.7  --   PHOS 4.0 3.9  --   --   --   --   --   --    < > = values in this interval not displayed.    GFR: Estimated Creatinine Clearance: 84.2 mL/min (by C-G formula based on SCr of 0.84 mg/dL).  Liver Function Tests: Recent Labs  Lab 12/27/20 1020 12/27/20 1310  AST 22 20  ALT 15 15  ALKPHOS 36* 32*  BILITOT 1.2 1.5*  PROT 7.1 6.6  ALBUMIN 4.2 3.9    Recent Results (from  the past 240 hour(s))  Resp Panel by RT-PCR (Flu A&B, Covid) Nasopharyngeal Swab     Status: None   Collection Time: 12/27/20 10:28 AM   Specimen: Nasopharyngeal Swab; Nasopharyngeal(NP) swabs in vial transport medium  Result Value Ref Range Status   SARS Coronavirus 2 by RT PCR NEGATIVE NEGATIVE Final    Comment: (NOTE) SARS-CoV-2 target nucleic acids are NOT DETECTED.  The SARS-CoV-2 RNA is generally detectable in upper respiratory specimens during the acute phase of infection. The lowest concentration of SARS-CoV-2 viral copies this assay can detect is 138 copies/mL. A negative result does not preclude SARS-Cov-2 infection and should not be used as the sole basis for treatment or other patient management decisions. A negative result may occur with  improper specimen collection/handling, submission of specimen other than nasopharyngeal swab, presence of viral mutation(s) within the areas targeted by this assay, and inadequate number of viral copies(<138 copies/mL). A negative result must be combined with clinical observations, patient history, and epidemiological information. The expected result is Negative.  Fact Sheet for Patients:  EntrepreneurPulse.com.au  Fact Sheet for Healthcare Providers:  IncredibleEmployment.be  This test is no t yet approved or cleared by the  [place] FDA and  has been authorized for detection and/or diagnosis of SARS-CoV-2 by FDA under an Emergency Use Authorization (EUA). This EUA will remain  in effect (meaning this test can be used) for the duration of the COVID-19 declaration under Section 564(b)(1) of the Act, 21 U.S.C.section 360bbb-3(b)(1), unless the authorization is terminated  or revoked sooner.       Influenza A by PCR NEGATIVE NEGATIVE Final   Influenza B by PCR NEGATIVE NEGATIVE Final    Comment: (NOTE) The Xpert Xpress SARS-CoV-2/FLU/RSV plus assay is intended as an aid in the diagnosis of influenza from Nasopharyngeal swab specimens and should not be used as a sole basis for treatment. Nasal washings and aspirates are unacceptable for Xpert Xpress SARS-CoV-2/FLU/RSV testing.  Fact Sheet for Patients: EntrepreneurPulse.com.au  Fact Sheet for Healthcare Providers: IncredibleEmployment.be  This test is not yet approved or cleared by the [place] FDA and has been authorized for detection and/or diagnosis of SARS-CoV-2 by FDA under an Emergency Use Authorization (EUA). This EUA will remain in effect (meaning this test can be used) for the duration of the COVID-19 declaration under Section 564(b)(1) of the Act, 21 U.S.C. section 360bbb-3(b)(1), unless the authorization is terminated or revoked.  Performed at [facility], [address], [place], [place] [address]   MRSA PCR Screening     Status: None   Collection Time: 12/27/20 12:55 PM   Specimen: Nasal Mucosa; Nasopharyngeal  Result Value Ref Range Status   MRSA by PCR NEGATIVE NEGATIVE Final    Comment:        The GeneXpert MRSA Assay (FDA approved for NASAL specimens only), is one component of a comprehensive MRSA colonization surveillance program. It is not intended to diagnose MRSA infection nor to guide or monitor treatment for MRSA infections. Performed at [facility], [address],  [place], [place] [address]   Culture, Urine     Status: Abnormal   Collection Time: 12/27/20  1:42 PM   Specimen: Urine, Clean Catch  Result Value Ref Range Status   Specimen Description   Final    URINE, CLEAN CATCH Performed at [facility], [address] [place], [place] [address]    Special Requests   Final    NONE Performed at [facility], [address], [place], [place]  [address]    Culture >=100,000 COLONIES/mL ENTEROCOCCUS FAECALIS (A)  Final   Report Status 12/29/2020 FINAL  Final   Organism ID, Bacteria ENTEROCOCCUS FAECALIS (A)  Final      Susceptibility   Enterococcus faecalis - MIC*    AMPICILLIN <=2 SENSITIVE Sensitive     NITROFURANTOIN <=16 SENSITIVE Sensitive     VANCOMYCIN 1 SENSITIVE Sensitive     * >=100,000 COLONIES/mL ENTEROCOCCUS FAECALIS     Radiology Studies: No results found.  Scheduled Meds: . budesonide (PULMICORT) nebulizer solution  0.5 mg Nebulization BID  . chlordiazePOXIDE  10 mg Oral TID  . Chlorhexidine Gluconate Cloth  6 each Topical Daily  . enoxaparin (LOVENOX) injection  40 mg Subcutaneous Q24H  . feeding supplement  237 mL Oral BID BM  . folic acid  1 mg Oral Daily  . ipratropium-albuterol  3 mL Nebulization TID  . metoprolol tartrate  2.5 mg Intravenous Q8H  . multivitamin with minerals  1 tablet Oral Daily  . pantoprazole (PROTONIX) IV  40 mg Intravenous Q24H  . thiamine  100 mg Oral Daily   Or  . thiamine  100 mg Intravenous Daily   Continuous Infusions: . dexmedetomidine (PRECEDEX) IV infusion 1.2 mcg/kg/hr (12/30/20 1151)     LOS: 3 days    Time spent: 35 minutes   Barton Dubois, MD [facility]   To contact the attending provider between 7A-7P or the covering provider during after hours 7P-7A, please log into the web site [email or web address] and access using universal [place] password for that web site. If you do not have the password, please call the hospital operator.  12/30/2020, 5:28 PM

## 2020-12-30 NOTE — Progress Notes (Signed)
Patient calm and cooperate with cares. Restarted on a diet and is tolerating it well at this time with no complaints of n/v. Good po intake and output noted.   Started on librium. Patient alert and oriented and understands current treatment plan.

## 2020-12-30 NOTE — Progress Notes (Signed)
Physical Therapy Treatment Patient Details Name: Jacob Frank MRN: [id number] DOB: 26-Oct-1950 Today's Date: 12/30/2020    History of Present Illness Jacob Frank is a 70 y.o. male with medical history of hypertension, COPD, tobacco abuse, alcohol abuse, anxiety/depression, B12 deficiency presenting with generalized weakness for the past week.  The patient states that he has had progressive weakness with recurrent nausea and vomiting for the past week.  This has been associated with poor oral intake.  His weakness has progressed to the point where he had difficulty getting out of bed on 12/26/2020.  Unfortunately, he continues to drink half a gallon of vodka on a daily basis.  His last drink was on 12/24/2020.  He continues to smoke cigarettes.  He has a history of 40 pack years.  He denies any fevers, chills, chest pain, hemoptysis.  He has chronic shortness of breath which she states is not any worse than his usual.  He has a nonproductive cough.  There is no hemoptysis.  He has not had any hematemesis.  He denies any abdominal pain or diarrhea.  There is no hematochezia or melena.  He denies any dysuria or hematuria.  He denies any illicit drug use.  Notably, the patient had TURP performed on 11/24/2020.  In the emergency department, the patient had low-grade temperature of 99.0 F.  He was hemodynamically stable with oxygen saturation 99% on room air.  BMP showed sodium 117 and serum creatinine 1.17.  LFTs were unremarkable with lipase 28.  WBC 11.0, hemoglobin 14.9, platelets 280,000.  Chest x-ray showed hyperinflation without consolidation.  EKG was sinus rhythm with no ST ST wave changes.  Because of his generalized weakness and low sodium, the patient was admitted for further evaluation.    PT Comments    REASSESSMENT: Patient transferred from medical floor to ICU due agitation requiring increased supervision.  Patient presents lethargic, but able to participate with therapy requiring Mod verbal/tactile  cueing to follow directions.  Patient limited to a few unsteady ataxic like steps at bedside due to weakness and lethargy.  Patient tolerated sitting up in chair after therapy - nursing staff notified.  Patient will benefit from continued physical therapy in hospital and recommended venue below to increase strength, balance, endurance for safe ADLs and gait.   Follow Up Recommendations  SNF;Supervision for mobility/OOB;Supervision - Intermittent     Equipment Recommendations  None recommended by PT    Recommendations for Other Services       Precautions / Restrictions Precautions Precautions: Fall Restrictions Weight Bearing Restrictions: No    Mobility  Bed Mobility Overal bed mobility: Needs Assistance Bed Mobility: Supine to Sit     Supine to sit: Mod assist     General bed mobility comments: increased time, labored movement    Transfers Overall transfer level: Needs assistance Equipment used: Rolling walker (2 wheeled) Transfers: Sit to/from [facility] Sit to Stand: Min assist;Mod assist Stand pivot transfers: Mod assist       General transfer comment: slow labored movement  Ambulation/Gait Ambulation/Gait assistance: Mod assist Gait Distance (Feet): 6 Feet Assistive device: Rolling walker (2 wheeled) Gait Pattern/deviations: Decreased step length - left;Decreased step length - right;Decreased stride length;Ataxic Gait velocity: decreased   General Gait Details: limited to 5-6 slow labored unsteady ataxic like steps due to weakness, lethargy   Stairs             Wheelchair Mobility    Modified Rankin (Stroke Patients Only)  Balance Overall balance assessment: Needs assistance Sitting-balance support: Feet supported;No upper extremity supported Sitting balance-Leahy Scale: Fair Sitting balance - Comments: fair/good seated at EOB   Standing balance support: During functional activity;Bilateral upper extremity  supported Standing balance-Leahy Scale: Poor Standing balance comment: using RW                            Cognition Arousal/Alertness: Lethargic;Suspect due to medications Behavior During Therapy: [facility] for tasks assessed/performed Overall Cognitive Status: Within Functional Limits for tasks assessed                                 General Comments: Requires repeated verbal cueing due to lethargy      Exercises General Exercises - Lower Extremity Ankle Circles/Pumps: Seated;AROM;Strengthening;Both;5 reps Short Arc Quad: Seated;AROM;Strengthening;Both;5 reps    General Comments        Pertinent Vitals/Pain Pain Assessment: No/denies pain    Home Living                      Prior Function            PT Goals (current goals can now be found in the care plan section) Acute Rehab PT Goals Patient Stated Goal: return home able to take care of self PT Goal Formulation: With patient Time For Goal Achievement: 01/13/21 Potential to Achieve Goals: Good Progress towards PT goals: Progressing toward goals    Frequency    Min 3X/week      PT Plan Discharge plan needs to be updated    Co-evaluation              AM-PAC PT "6 Clicks" Mobility   Outcome Measure  Help needed turning from your back to your side while in a flat bed without using bedrails?: A Lot Help needed moving from lying on your back to sitting on the side of a flat bed without using bedrails?: A Lot Help needed moving to and from a bed to a chair (including a wheelchair)?: A Lot Help needed standing up from a chair using your arms (e.g., wheelchair or bedside chair)?: A Lot Help needed to walk in hospital room?: A Lot Help needed climbing 3-5 steps with a railing? : Total 6 Click Score: 11    End of Session Equipment Utilized During Treatment: Oxygen Activity Tolerance: Patient tolerated treatment well;Patient limited by fatigue Patient left: in chair;with call  bell/phone within reach;with chair alarm set Nurse Communication: Mobility status PT Visit Diagnosis: Unsteadiness on feet (R26.81);Other abnormalities of gait and mobility (R26.89);Muscle weakness (generalized) (M62.81)     Time: 4008-6761 PT Time Calculation (min) (ACUTE ONLY): 27 min  Charges:  $Therapeutic Activity: 23-37 mins                     1:54 PM, 12/30/20 Lonell Grandchild, MPT Physical Therapist with [facility] [phone] office [phone] mobile phone

## 2020-12-31 DIAGNOSIS — E871 Hypo-osmolality and hyponatremia: Secondary | ICD-10-CM | POA: Diagnosis not present

## 2020-12-31 DIAGNOSIS — F10230 Alcohol dependence with withdrawal, uncomplicated: Secondary | ICD-10-CM | POA: Diagnosis not present

## 2020-12-31 DIAGNOSIS — R1013 Epigastric pain: Secondary | ICD-10-CM | POA: Diagnosis not present

## 2020-12-31 DIAGNOSIS — F1010 Alcohol abuse, uncomplicated: Secondary | ICD-10-CM | POA: Diagnosis not present

## 2020-12-31 LAB — GLUCOSE, CAPILLARY: Glucose-Capillary: 102 mg/dL — ABNORMAL HIGH (ref 70–99)

## 2020-12-31 LAB — MAGNESIUM: Magnesium: 1.6 mg/dL — ABNORMAL LOW (ref 1.7–2.4)

## 2020-12-31 MED ORDER — AMLODIPINE BESYLATE 5 MG PO TABS
10.0000 mg | ORAL_TABLET | Freq: Every day | ORAL | Status: DC
  Administered 2020-12-31 – 2021-01-02 (×3): 10 mg via ORAL
  Filled 2020-12-31 (×3): qty 2

## 2020-12-31 MED ORDER — METOPROLOL SUCCINATE ER 50 MG PO TB24
100.0000 mg | ORAL_TABLET | Freq: Every day | ORAL | Status: DC
  Administered 2020-12-31 – 2021-01-02 (×3): 100 mg via ORAL
  Filled 2020-12-31 (×3): qty 2

## 2020-12-31 MED ORDER — VITAMIN B-12 1000 MCG PO TABS
1000.0000 ug | ORAL_TABLET | Freq: Every day | ORAL | Status: DC
  Administered 2020-12-31 – 2021-01-02 (×3): 1000 ug via ORAL
  Filled 2020-12-31 (×3): qty 1

## 2020-12-31 MED ORDER — PANTOPRAZOLE SODIUM 40 MG PO TBEC
40.0000 mg | DELAYED_RELEASE_TABLET | Freq: Every day | ORAL | Status: DC
  Administered 2020-12-31 – 2021-01-02 (×3): 40 mg via ORAL
  Filled 2020-12-31 (×3): qty 1

## 2020-12-31 MED ORDER — AMOXICILLIN-POT CLAVULANATE 875-125 MG PO TABS
1.0000 | ORAL_TABLET | Freq: Two times a day (BID) | ORAL | Status: DC
  Administered 2020-12-31 – 2021-01-02 (×4): 1 via ORAL
  Filled 2020-12-31 (×4): qty 1

## 2020-12-31 MED ORDER — QUETIAPINE FUMARATE 25 MG PO TABS
25.0000 mg | ORAL_TABLET | Freq: Two times a day (BID) | ORAL | Status: DC
  Administered 2020-12-31 – 2021-01-02 (×5): 25 mg via ORAL
  Filled 2020-12-31 (×5): qty 1

## 2020-12-31 NOTE — Progress Notes (Signed)
PROGRESS NOTE    Jacob Frank  XID:[id number] DOB: 13-Jul-1951 DOA: 12/27/2020 PCP: [name], MD   Chief Complaint  Patient presents with  . Alcohol Problem    Brief admission Narrative:  As per H&P written by Dr. Carles Collet on 12/27/20. Jacob Frank is a 70 y.o. male with medical history of hypertension, COPD, tobacco abuse, alcohol abuse, anxiety/depression, B12 deficiency presenting with generalized weakness for the past week.  The patient states that he has had progressive weakness with recurrent nausea and vomiting for the past week.  This has been associated with poor oral intake.  His weakness has progressed to the point where he had difficulty getting out of bed on 12/26/2020.  Unfortunately, he continues to drink half a gallon of vodka on a daily basis.  His last drink was on 12/24/2020.  He continues to smoke cigarettes.  He has a history of 40 pack years.  He denies any fevers, chills, chest pain, hemoptysis.  He has chronic shortness of breath which she states is not any worse than his usual.  He has a nonproductive cough.  There is no hemoptysis.  He has not had any hematemesis.  He denies any abdominal pain or diarrhea.  There is no hematochezia or melena.  He denies any dysuria or hematuria.  He denies any illicit drug use.  Notably, the patient had TURP performed on 11/24/2020. In the emergency department, the patient had low-grade temperature of 99.0 F.  He was hemodynamically stable with oxygen saturation 99% on room air.  BMP showed sodium 117 and serum creatinine 1.17.  LFTs were unremarkable with lipase 28.  WBC 11.0, hemoglobin 14.9, platelets 280,000.  Chest x-ray showed hyperinflation without consolidation.  EKG was sinus rhythm with no ST ST wave changes.  Because of his generalized weakness and low sodium, the patient was admitted for further evaluation.   Assessment & Plan: 1-hyponatremia: -in the setting of dehydration, decrease solute intake, GI loses and volume  depletion. -patient alcohol consumption also playing a role into it -Continue to maintain adequate hydration; diet has been advanced. -no neurologic deficits appreciated -Corrected and stabilized. -As mentioned below positive for infection in the urine which could potentially also be playing a role, was found it.  Continue antibiotic therapy as mentioned below.  2-hypokalemia -in the setting of poor oral intake and GI loses prior to admission -Mg WNL -will continue potassium repletion and follow trend  -continue monitoring on telemetry for now.  3-HTN -fair control appreciated -cotninue metoprolol and resume amlodipine -We will follow vital signs and further adjust antihypertensive regimen as needed.  4-Tobacco dependence -cessation counseling provided -nicotine patch offered   5-COPD -no acute exacerbation appreciated -good O2 sat on RA -continue home bronchodilators and maintenance therapy  6-Vitamin B12 deficiency -resume monthly injection as an outpatient  7-Anxiety and depression -no SI or hallucinations -continue home Wellbutrin  8-Alcohol abuse -no frank withdrawal appreciated currently -patient is contemplating quitting, but not ready yet -started on precedex  -continue thiamine and folic acid -will add librium and follow response -continue weaning off precedex  9-GERD -continue PPI  10-enterococcus UTI -Following sensitivity patient has been started on Augmentin. -Continue to maintain adequate hydration.  DVT prophylaxis: Lovenox. Code Status: Full Code. Family Communication: no family at bedside. Disposition:   Status is: Inpatient  Dispo: The patient is from: home               Anticipated d/c is to: home with home health services.  Patient currently is not medically stable yet; still with ongoing hyponatremia (improving) and in the process of having diet advanced. Anticipating 1 more days prior to discharge; weak, tired and  deconditioned.  Physical therapy recommending SNF.  Patient has declined also, Home health services instead.   Difficult to place patient no     Consultants:   None    Procedures:  See below for x-ray reports.    Antimicrobials:  Augmentin   Subjective: Off Precedex; oriented x3 following commands and in no acute distress.  Feeling weak, tired and deconditioned.  Expressing frequency.  Objective: Vitals:   12/31/20 1300 12/31/20 1400 12/31/20 1500 12/31/20 1600  BP:  (!) 148/100  (!) 167/102  Pulse: 84 78 81   Resp: 11     Temp:      TempSrc:      SpO2: 98% 97% 99%   Weight:      Height:        Intake/Output Summary (Last 24 hours) at 12/31/2020 1757 Last data filed at 12/31/2020 1303 Gross per 24 hour  Intake --  Output 1850 ml  Net -1850 ml   Filed Weights   12/27/20 1250 12/28/20 0359 12/29/20 0817  Weight: 85.9 kg 77.9 kg 79.8 kg    Examination: General exam: Alert, awake, oriented x 3; following commands appropriately and reporting no nausea vomiting.  Feeling weak, tired and deconditioned.  Patient reported some frequency but no dysuria. Respiratory system: Positive scattered rhonchi, no wheezing, no using accessory muscle.  Good saturation on room air. Cardiovascular system:RRR. No murmurs, rubs, gallops.  No JVD Gastrointestinal system: Abdomen is nondistended, soft and nontender. No organomegaly or masses felt. Normal bowel sounds heard. Central nervous system: No focal neurological deficits. Extremities: No cyanosis or clubbing. Skin: No rashes, lesions or ulcers Psychiatry: Mood and affect appropriate currently.   Data Reviewed: I have personally reviewed following labs and imaging studies  CBC: Recent Labs  Lab 12/27/20 1020 12/27/20 1310 12/28/20 0321  WBC 11.0* 11.7* 6.7  NEUTROABS 8.3*  --   --   HGB 14.9 13.8 13.2  HCT 40.7 37.8* 37.3*  MCV 93.1 93.8 96.1  PLT 280 242 604    Basic Metabolic Panel: Recent Labs  Lab 12/27/20 1020  12/27/20 1310 12/27/20 1638 12/27/20 2020 12/27/20 2306 12/28/20 0321 12/29/20 0529 12/30/20 0815 12/31/20 0457  NA 117* 119*   < > 117* 118* 122*  121* 128* 133*  --   K 3.7 3.4*   < > 3.3* 3.5 3.5  3.4* 3.2* 3.8  --   CL 79* 82*   < > 80* 83* 87*  86* 92* 99  --   CO2 25 25   < > 26 26 23  25 25 27   --   GLUCOSE 104* 91   < > 84 86 79  78 85 113*  --   BUN 18 20   < > 20 20 18  18 20 17   --   CREATININE 1.17 1.17   < > 1.21 1.24 1.07  1.08 1.04 0.84  --   CALCIUM 9.2 9.0   < > 8.1* 8.3* 8.5*  8.4* 8.5* 8.5*  --   MG 1.7 1.6*  --   --   --   --  1.7  --  1.6*  PHOS 4.0 3.9  --   --   --   --   --   --   --    < > = values in  this interval not displayed.    GFR: Estimated Creatinine Clearance: 84.2 mL/min (by C-G formula based on SCr of 0.84 mg/dL).  Liver Function Tests: Recent Labs  Lab 12/27/20 1020 12/27/20 1310  AST 22 20  ALT 15 15  ALKPHOS 36* 32*  BILITOT 1.2 1.5*  PROT 7.1 6.6  ALBUMIN 4.2 3.9    Recent Results (from the past 240 hour(s))  Resp Panel by RT-PCR (Flu A&B, Covid) Nasopharyngeal Swab     Status: None   Collection Time: 12/27/20 10:28 AM   Specimen: Nasopharyngeal Swab; Nasopharyngeal(NP) swabs in vial transport medium  Result Value Ref Range Status   SARS Coronavirus 2 by RT PCR NEGATIVE NEGATIVE Final    Comment: (NOTE) SARS-CoV-2 target nucleic acids are NOT DETECTED.  The SARS-CoV-2 RNA is generally detectable in upper respiratory specimens during the acute phase of infection. The lowest concentration of SARS-CoV-2 viral copies this assay can detect is 138 copies/mL. A negative result does not preclude SARS-Cov-2 infection and should not be used as the sole basis for treatment or other patient management decisions. A negative result may occur with  improper specimen collection/handling, submission of specimen other than nasopharyngeal swab, presence of viral mutation(s) within the areas targeted by this assay, and inadequate  number of viral copies(<138 copies/mL). A negative result must be combined with clinical observations, patient history, and epidemiological information. The expected result is Negative.  Fact Sheet for Patients:  EntrepreneurPulse.com.au  Fact Sheet for Healthcare Providers:  IncredibleEmployment.be  This test is no t yet approved or cleared by the [place] FDA and  has been authorized for detection and/or diagnosis of SARS-CoV-2 by FDA under an Emergency Use Authorization (EUA). This EUA will remain  in effect (meaning this test can be used) for the duration of the COVID-19 declaration under Section 564(b)(1) of the Act, 21 U.S.C.section 360bbb-3(b)(1), unless the authorization is terminated  or revoked sooner.       Influenza A by PCR NEGATIVE NEGATIVE Final   Influenza B by PCR NEGATIVE NEGATIVE Final    Comment: (NOTE) The Xpert Xpress SARS-CoV-2/FLU/RSV plus assay is intended as an aid in the diagnosis of influenza from Nasopharyngeal swab specimens and should not be used as a sole basis for treatment. Nasal washings and aspirates are unacceptable for Xpert Xpress SARS-CoV-2/FLU/RSV testing.  Fact Sheet for Patients: EntrepreneurPulse.com.au  Fact Sheet for Healthcare Providers: IncredibleEmployment.be  This test is not yet approved or cleared by the [place] FDA and has been authorized for detection and/or diagnosis of SARS-CoV-2 by FDA under an Emergency Use Authorization (EUA). This EUA will remain in effect (meaning this test can be used) for the duration of the COVID-19 declaration under Section 564(b)(1) of the Act, 21 U.S.C. section 360bbb-3(b)(1), unless the authorization is terminated or revoked.  Performed at [facility], [address], [place], [place] [address]   MRSA PCR Screening     Status: None   Collection Time: 12/27/20 12:55 PM   Specimen: Nasal Mucosa;  Nasopharyngeal  Result Value Ref Range Status   MRSA by PCR NEGATIVE NEGATIVE Final    Comment:        The GeneXpert MRSA Assay (FDA approved for NASAL specimens only), is one component of a comprehensive MRSA colonization surveillance program. It is not intended to diagnose MRSA infection nor to guide or monitor treatment for MRSA infections. Performed at [facility], [address], [place], [place] [address]   Culture, Urine     Status: Abnormal  Collection Time: 12/27/20  1:42 PM   Specimen: Urine, Clean Catch  Result Value Ref Range Status   Specimen Description   Final    URINE, CLEAN CATCH Performed at [facility], [address] [place], [place] [address]    Special Requests   Final    NONE Performed at [facility], [address], [place], [place] [address]    Culture >=100,000 COLONIES/mL ENTEROCOCCUS FAECALIS (A)  Final   Report Status 12/29/2020 FINAL  Final   Organism ID, Bacteria ENTEROCOCCUS FAECALIS (A)  Final      Susceptibility   Enterococcus faecalis - MIC*    AMPICILLIN <=2 SENSITIVE Sensitive     NITROFURANTOIN <=16 SENSITIVE Sensitive     VANCOMYCIN 1 SENSITIVE Sensitive     * >=100,000 COLONIES/mL ENTEROCOCCUS FAECALIS     Radiology Studies: No results found.  Scheduled Meds: . amLODipine  10 mg Oral Daily  . budesonide (PULMICORT) nebulizer solution  0.5 mg Nebulization BID  . chlordiazePOXIDE  10 mg Oral TID  . Chlorhexidine Gluconate Cloth  6 each Topical Daily  . enoxaparin (LOVENOX) injection  40 mg Subcutaneous Q24H  . feeding supplement  237 mL Oral BID BM  . folic acid  1 mg Oral Daily  . ipratropium-albuterol  3 mL Nebulization TID  . metoprolol succinate  100 mg Oral Daily  . multivitamin with minerals  1 tablet Oral Daily  . pantoprazole  40 mg Oral Daily  . QUEtiapine  25 mg Oral BID  . thiamine  100 mg Oral Daily   Or  . thiamine  100 mg Intravenous Daily  . vitamin B-12  1,000 mcg Oral Daily   Continuous  Infusions:    LOS: 4 days    Time spent: 35 minutes   Barton Dubois, MD [facility]   To contact the attending provider between 7A-7P or the covering provider during after hours 7P-7A, please log into the web site [email or web address] and access using universal [place] password for that web site. If you do not have the password, please call the hospital operator.  12/31/2020, 5:57 PM

## 2021-01-01 DIAGNOSIS — E871 Hypo-osmolality and hyponatremia: Secondary | ICD-10-CM | POA: Diagnosis not present

## 2021-01-01 DIAGNOSIS — F10230 Alcohol dependence with withdrawal, uncomplicated: Secondary | ICD-10-CM | POA: Diagnosis not present

## 2021-01-01 DIAGNOSIS — F1020 Alcohol dependence, uncomplicated: Secondary | ICD-10-CM

## 2021-01-01 DIAGNOSIS — F1010 Alcohol abuse, uncomplicated: Secondary | ICD-10-CM | POA: Diagnosis not present

## 2021-01-01 LAB — MAGNESIUM: Magnesium: 1.6 mg/dL — ABNORMAL LOW (ref 1.7–2.4)

## 2021-01-01 MED ORDER — LORAZEPAM 2 MG/ML IJ SOLN
1.0000 mg | Freq: Once | INTRAMUSCULAR | Status: AC
  Administered 2021-01-01: 1 mg via INTRAVENOUS
  Filled 2021-01-01: qty 1

## 2021-01-01 MED ORDER — MAGNESIUM SULFATE 2 GM/50ML IV SOLN
2.0000 g | Freq: Once | INTRAVENOUS | Status: AC
  Administered 2021-01-01: 2 g via INTRAVENOUS
  Filled 2021-01-01: qty 50

## 2021-01-01 NOTE — Progress Notes (Signed)
PROGRESS NOTE    Jacob Frank  DVV:[id number] DOB: 1951/07/29 DOA: 12/27/2020 PCP: [name], MD   Chief Complaint  Patient presents with  . Alcohol Problem    Brief admission Narrative:  As per H&P written by Dr. Carles Collet on 12/27/20. Jacob Frank is a 70 y.o. male with medical history of hypertension, COPD, tobacco abuse, alcohol abuse, anxiety/depression, B12 deficiency presenting with generalized weakness for the past week.  The patient states that he has had progressive weakness with recurrent nausea and vomiting for the past week.  This has been associated with poor oral intake.  His weakness has progressed to the point where he had difficulty getting out of bed on 12/26/2020.  Unfortunately, he continues to drink half a gallon of vodka on a daily basis.  His last drink was on 12/24/2020.  He continues to smoke cigarettes.  He has a history of 40 pack years.  He denies any fevers, chills, chest pain, hemoptysis.  He has chronic shortness of breath which she states is not any worse than his usual.  He has a nonproductive cough.  There is no hemoptysis.  He has not had any hematemesis.  He denies any abdominal pain or diarrhea.  There is no hematochezia or melena.  He denies any dysuria or hematuria.  He denies any illicit drug use.  Notably, the patient had TURP performed on 11/24/2020. In the emergency department, the patient had low-grade temperature of 99.0 F.  He was hemodynamically stable with oxygen saturation 99% on room air.  BMP showed sodium 117 and serum creatinine 1.17.  LFTs were unremarkable with lipase 28.  WBC 11.0, hemoglobin 14.9, platelets 280,000.  Chest x-ray showed hyperinflation without consolidation.  EKG was sinus rhythm with no ST ST wave changes.  Because of his generalized weakness and low sodium, the patient was admitted for further evaluation.   Assessment & Plan: 1-hyponatremia: -in the setting of dehydration, decrease solute intake, GI loses and volume  depletion. -patient alcohol consumption also playing a role into it -Continue to maintain adequate hydration; diet has been advanced, and tolerating better. -no neurologic deficits appreciated -Corrected and stabilized. -As mentioned below positive for infection in the urine which could potentially also be playing a role, was found it.  Continue antibiotic therapy as mentioned below.  2-hypokalemia/hypomagnesemia -in the setting of poor oral intake and GI loses prior to admission -Mg is low; will replete -will continue potassium repletion and follow trend  -continue monitoring on telemetry for now.  3-HTN -fair control appreciated -cotninue metoprolol and resume amlodipine -continue to follow vital signs and further adjust antihypertensive regimen as needed.  4-Tobacco dependence -cessation counseling provided -nicotine patch offered   5-COPD -no acute exacerbation appreciated -good O2 sat on RA -continue home bronchodilators and maintenance therapy  6-Vitamin B12 deficiency -resume monthly injection as an outpatient -alcohol cessation discussed; as alcoholism responsible for low B12.  7-Anxiety and depression -no SI or hallucinations -continue home Wellbutrin  8-Alcohol abuse -no frank withdrawal appreciated currently -patient is contemplating quitting, but not ready yet -continue thiamine and folic acid -will continue librium and follow CIWA protocol -still with mildly elevated CIWA score -off precedex, no agitation, no combativeness.  9-GERD -continue PPI  10-enterococcus UTI -Following sensitivity patient has been started on Augmentin. -Continue to maintain adequate hydration.  DVT prophylaxis: Lovenox. Code Status: Full Code. Family Communication: no family at bedside. Disposition:   Status is: Inpatient  Dispo: The patient is from: home  Anticipated d/c is to: home with home health services.              Patient currently is not medically  stable yet; still with ongoing hyponatremia (improving) and in the process of having diet advanced. Still Anticipating 1 more day prior to discharge; weak, tired and deconditioned.  Physical therapy recommending SNF.  Patient has declined,Home health services requested instead. Home on 01/02/21   Difficult to place patient no     Consultants:   None    Procedures:  See below for x-ray reports.    Antimicrobials:  Augmentin   Subjective: No chest pain, no nausea, no vomiting.  Currently oriented x3 and following commands appropriately.  Patient is afebrile.  Feeling weak and tired.  Reports improvement in oral intake.  No nausea vomiting.  Objective: Vitals:   01/01/21 0721 01/01/21 0726 01/01/21 0846 01/01/21 1205  BP:   (!) 151/109 (!) 144/98  Pulse:   84 79  Resp:    20  Temp:    (!) 97.5 F (36.4 C)  TempSrc:    Oral  SpO2: 94% 94%  97%  Weight:      Height:        Intake/Output Summary (Last 24 hours) at 01/01/2021 1258 Last data filed at 01/01/2021 1100 Gross per 24 hour  Intake 502.25 ml  Output 1050 ml  Net -547.75 ml   Filed Weights   12/27/20 1250 12/28/20 0359 12/29/20 0817  Weight: 85.9 kg 77.9 kg 79.8 kg    Examination: General exam: Alert, awake, oriented x 3; no chest pain, while symptomatic bilateral.  Patient reports improvement in his oral intake. Still with frequency, no dysuria. Afebrile.  Respiratory system: No significant diarrhea, no recent home although using accessory muscle.  Good saturation on room Cardiovascular system: RRR, no rubs, no gallops, no JVD Gastrointestinal system: Abdomen is nondistended, soft and nontender. No organomegaly or masses felt. Normal bowel sounds heard. Central nervous system: Alert and oriented. No focal neurological deficits. Extremities: No cyanosis, no clubbing.  Skin: No petechiae.  Psychiatry: Judgement and insight appear normal. Mood & affect appropriate.   Data Reviewed: I have personally reviewed  following labs and imaging studies  CBC: Recent Labs  Lab 12/27/20 1020 12/27/20 1310 12/28/20 0321  WBC 11.0* 11.7* 6.7  NEUTROABS 8.3*  --   --   HGB 14.9 13.8 13.2  HCT 40.7 37.8* 37.3*  MCV 93.1 93.8 96.1  PLT 280 242 703    Basic Metabolic Panel: Recent Labs  Lab 12/27/20 1020 12/27/20 1310 12/27/20 1638 12/27/20 2020 12/27/20 2306 12/28/20 0321 12/29/20 0529 12/30/20 0815 12/31/20 0457 01/01/21 0604  NA 117* 119*   < > 117* 118* 122*  121* 128* 133*  --   --   K 3.7 3.4*   < > 3.3* 3.5 3.5  3.4* 3.2* 3.8  --   --   CL 79* 82*   < > 80* 83* 87*  86* 92* 99  --   --   CO2 25 25   < > 26 26 23  25 25 27   --   --   GLUCOSE 104* 91   < > 84 86 79  78 85 113*  --   --   BUN 18 20   < > 20 20 18  18 20 17   --   --   CREATININE 1.17 1.17   < > 1.21 1.24 1.07  1.08 1.04 0.84  --   --  CALCIUM 9.2 9.0   < > 8.1* 8.3* 8.5*  8.4* 8.5* 8.5*  --   --   MG 1.7 1.6*  --   --   --   --  1.7  --  1.6* 1.6*  PHOS 4.0 3.9  --   --   --   --   --   --   --   --    < > = values in this interval not displayed.    GFR: Estimated Creatinine Clearance: 84.2 mL/min (by C-G formula based on SCr of 0.84 mg/dL).  Liver Function Tests: Recent Labs  Lab 12/27/20 1020 12/27/20 1310  AST 22 20  ALT 15 15  ALKPHOS 36* 32*  BILITOT 1.2 1.5*  PROT 7.1 6.6  ALBUMIN 4.2 3.9    Recent Results (from the past 240 hour(s))  Resp Panel by RT-PCR (Flu A&B, Covid) Nasopharyngeal Swab     Status: None   Collection Time: 12/27/20 10:28 AM   Specimen: Nasopharyngeal Swab; Nasopharyngeal(NP) swabs in vial transport medium  Result Value Ref Range Status   SARS Coronavirus 2 by RT PCR NEGATIVE NEGATIVE Final    Comment: (NOTE) SARS-CoV-2 target nucleic acids are NOT DETECTED.  The SARS-CoV-2 RNA is generally detectable in upper respiratory specimens during the acute phase of infection. The lowest concentration of SARS-CoV-2 viral copies this assay can detect is 138 copies/mL. A  negative result does not preclude SARS-Cov-2 infection and should not be used as the sole basis for treatment or other patient management decisions. A negative result may occur with  improper specimen collection/handling, submission of specimen other than nasopharyngeal swab, presence of viral mutation(s) within the areas targeted by this assay, and inadequate number of viral copies(<138 copies/mL). A negative result must be combined with clinical observations, patient history, and epidemiological information. The expected result is Negative.  Fact Sheet for Patients:  EntrepreneurPulse.com.au  Fact Sheet for Healthcare Providers:  IncredibleEmployment.be  This test is no t yet approved or cleared by the [place] FDA and  has been authorized for detection and/or diagnosis of SARS-CoV-2 by FDA under an Emergency Use Authorization (EUA). This EUA will remain  in effect (meaning this test can be used) for the duration of the COVID-19 declaration under Section 564(b)(1) of the Act, 21 U.S.C.section 360bbb-3(b)(1), unless the authorization is terminated  or revoked sooner.       Influenza A by PCR NEGATIVE NEGATIVE Final   Influenza B by PCR NEGATIVE NEGATIVE Final    Comment: (NOTE) The Xpert Xpress SARS-CoV-2/FLU/RSV plus assay is intended as an aid in the diagnosis of influenza from Nasopharyngeal swab specimens and should not be used as a sole basis for treatment. Nasal washings and aspirates are unacceptable for Xpert Xpress SARS-CoV-2/FLU/RSV testing.  Fact Sheet for Patients: EntrepreneurPulse.com.au  Fact Sheet for Healthcare Providers: IncredibleEmployment.be  This test is not yet approved or cleared by the [place] FDA and has been authorized for detection and/or diagnosis of SARS-CoV-2 by FDA under an Emergency Use Authorization (EUA). This EUA will remain in effect (meaning this test can  be used) for the duration of the COVID-19 declaration under Section 564(b)(1) of the Act, 21 U.S.C. section 360bbb-3(b)(1), unless the authorization is terminated or revoked.  Performed at [facility], [address], [place], [place] [address]   MRSA PCR Screening     Status: None   Collection Time: 12/27/20 12:55 PM   Specimen: Nasal Mucosa; Nasopharyngeal  Result Value Ref Range Status  MRSA by PCR NEGATIVE NEGATIVE Final    Comment:        The GeneXpert MRSA Assay (FDA approved for NASAL specimens only), is one component of a comprehensive MRSA colonization surveillance program. It is not intended to diagnose MRSA infection nor to guide or monitor treatment for MRSA infections. Performed at [facility], [address] [place], [place] [address]   Culture, Urine     Status: Abnormal   Collection Time: 12/27/20  1:42 PM   Specimen: Urine, Clean Catch  Result Value Ref Range Status   Specimen Description   Final    URINE, CLEAN CATCH Performed at [facility], [address], [place], [place] [address]    Special Requests   Final    NONE Performed at [facility], [address] [place], [place] [address]    Culture >=100,000 COLONIES/mL ENTEROCOCCUS FAECALIS (A)  Final   Report Status 12/29/2020 FINAL  Final   Organism ID, Bacteria ENTEROCOCCUS FAECALIS (A)  Final      Susceptibility   Enterococcus faecalis - MIC*    AMPICILLIN <=2 SENSITIVE Sensitive     NITROFURANTOIN <=16 SENSITIVE Sensitive     VANCOMYCIN 1 SENSITIVE Sensitive     * >=100,000 COLONIES/mL ENTEROCOCCUS FAECALIS     Radiology Studies: No results found.  Scheduled Meds: . amLODipine  10 mg Oral Daily  . amoxicillin-clavulanate  1 tablet Oral Q12H  . budesonide (PULMICORT) nebulizer solution  0.5 mg Nebulization BID  . chlordiazePOXIDE  10 mg Oral TID  . Chlorhexidine Gluconate Cloth  6 each Topical Daily  . enoxaparin (LOVENOX) injection  40 mg Subcutaneous Q24H  . feeding supplement  237  mL Oral BID BM  . folic acid  1 mg Oral Daily  . ipratropium-albuterol  3 mL Nebulization TID  . metoprolol succinate  100 mg Oral Daily  . multivitamin with minerals  1 tablet Oral Daily  . pantoprazole  40 mg Oral Daily  . QUEtiapine  25 mg Oral BID  . thiamine  100 mg Oral Daily   Or  . thiamine  100 mg Intravenous Daily  . vitamin B-12  1,000 mcg Oral Daily   Continuous Infusions:    LOS: 5 days    Time spent: 30 minutes   Barton Dubois, MD [facility]   To contact the attending provider between 7A-7P or the covering provider during after hours 7P-7A, please log into the web site [email or web address] and access using universal [place] password for that web site. If you do not have the password, please call the hospital operator.  01/01/2021, 12:58 PM

## 2021-01-02 DIAGNOSIS — F419 Anxiety disorder, unspecified: Secondary | ICD-10-CM | POA: Diagnosis not present

## 2021-01-02 DIAGNOSIS — F10239 Alcohol dependence with withdrawal, unspecified: Secondary | ICD-10-CM

## 2021-01-02 DIAGNOSIS — E871 Hypo-osmolality and hyponatremia: Secondary | ICD-10-CM | POA: Diagnosis not present

## 2021-01-02 DIAGNOSIS — F17200 Nicotine dependence, unspecified, uncomplicated: Secondary | ICD-10-CM | POA: Diagnosis not present

## 2021-01-02 DIAGNOSIS — F1020 Alcohol dependence, uncomplicated: Secondary | ICD-10-CM

## 2021-01-02 LAB — BASIC METABOLIC PANEL
Anion gap: 11 (ref 5–15)
BUN: 15 mg/dL (ref 8–23)
CO2: 25 mmol/L (ref 22–32)
Calcium: 8.6 mg/dL — ABNORMAL LOW (ref 8.9–10.3)
Chloride: 99 mmol/L (ref 98–111)
Creatinine, Ser: 0.91 mg/dL (ref 0.61–1.24)
GFR, Estimated: >60 mL/min (ref >60)
Glucose, Bld: 85 mg/dL (ref 70–99)
Potassium: 3.5 mmol/L (ref 3.5–5.1)
Sodium: 135 mmol/L (ref 135–145)

## 2021-01-02 LAB — MAGNESIUM: Magnesium: 1.8 mg/dL (ref 1.7–2.4)

## 2021-01-02 MED ORDER — AMOXICILLIN-POT CLAVULANATE 875-125 MG PO TABS: 1.0000 | ORAL_TABLET | Freq: Two times a day (BID) | ORAL | 0 refills | Status: AC

## 2021-01-02 MED ORDER — FOLIC ACID 1 MG PO TABS: 1.0000 mg | ORAL_TABLET | Freq: Every day | ORAL | 1 refills | Status: DC

## 2021-01-02 MED ORDER — CHLORDIAZEPOXIDE HCL 5 MG PO CAPS: ORAL_CAPSULE | ORAL | 0 refills | Status: DC

## 2021-01-02 MED ORDER — IPRATROPIUM-ALBUTEROL 0.5-2.5 (3) MG/3ML IN SOLN: 3.0000 mL | Freq: Two times a day (BID) | RESPIRATORY_TRACT | Status: DC

## 2021-01-02 MED ORDER — METOPROLOL SUCCINATE ER 100 MG PO TB24: 100.0000 mg | ORAL_TABLET | Freq: Every day | ORAL | 1 refills | Status: DC

## 2021-01-02 MED ORDER — CYANOCOBALAMIN 1000 MCG PO TABS: 1000.0000 ug | ORAL_TABLET | Freq: Every day | ORAL | 1 refills | Status: DC

## 2021-01-02 NOTE — TOC Transition Note (Signed)
Transition of Care [facility]) - CM/SW Discharge Note   Patient Details  Name: GRAYLEN NOBOA MRN: [id number] Date of Birth: [birth year]  [facility]) CM/SW Contact:  Boneta Lucks, RN Phone Number: 01/02/2021, 3:30 PM   Clinical Narrative:   Patient discharging today, home with Home Health. Marjory Lies with [facility] updated. Patient has a room mate, he has no driver licenses, States he needs a ride home. TOC will call [facility] as soon as RN has patient ready for discharge.     Final next level of care: [place] Barriers to Discharge: Barriers Resolved   Patient Goals and CMS Choice Patient states their goals for this hospitalization and ongoing recovery are:: to go home. CMS Medicare.gov Compare Post Acute Care list provided to:: Patient Choice offered to / list presented to : Patient  Discharge Placement              Patient chooses bed at:  [facility]) Patient to be transferred to facility by: [facility]   Patient and family notified of of transfer: 01/02/21  Discharge Plan and Services      Time DME Agency Contacted: 11 Representative spoke with at DME Agency: [place]: PT [place]: [facility] [employer]) Date [place]: 12/28/20 Time [place]: 1030 Representative spoke with at [place]: Marjory Lies  Readmission Risk Interventions Readmission Risk Prevention Plan 12/28/2020  Transportation Screening Complete  Home Care Screening Complete  Medication Review (RN CM) Complete  Some recent data might be hidden

## 2021-01-02 NOTE — Discharge Summary (Signed)
Physician Discharge Summary  Jacob Frank UMP:[id number] DOB: 22-Oct-1950 DOA: 12/27/2020  PCP: [name], MD  Admit date: 12/27/2020 Discharge date: 01/02/2021  Time spent: 35 minutes  Recommendations for Outpatient Follow-up:  Reassess blood pressure and adjust antihypertensive treatment as needed Repeat basic metabolic panel and magnesium level to follow electrolytes stability Continue assisting patient with alcohol and tobacco cessation Reassess complete resolution of patient urinary symptoms to assure complete clearance of UTI.  Discharge Diagnoses:  Active Problems:   Tobacco dependence   Vitamin B12 deficiency   Hyponatremia   Anxiety and depression   Alcohol abuse   Alcohol withdrawal syndrome with complication, with unspecified complication (HCC)   Alcohol use disorder, severe, dependence ([place])   Discharge Condition: Stable and improved.  Patient discharged home with infection follow-up with PCP in 10 days  CODE STATUS: Full code.  Diet recommendation: heart healthy diet   Filed Weights   12/27/20 1250 12/28/20 0359 12/29/20 0817  Weight: 85.9 kg 77.9 kg 79.8 kg    History of present illness:  As per H&P written by Dr. Carles Collet on 12/27/20. Jacob Vorhees Fulpis a 69 y.o.malewith medical history ofhypertension, COPD, tobacco abuse, alcohol abuse, anxiety/depression, B12 deficiency presenting with generalized weakness for the past week. The patient states that he has had progressive weakness with recurrent nausea and vomiting for the past week. This has been associated with poor oral intake. His weakness has progressed to the point where he had difficulty getting out of bed on 12/26/2020. Unfortunately, he continues to drink half a gallon of vodka on a daily basis. His last drink was on 12/24/2020. He continues to smoke cigarettes. He has a history of 40 pack years. He denies any fevers, chills, chest pain, hemoptysis. He has chronic shortness of breath which she states  is not any worse than his usual. He has a nonproductive cough. There is no hemoptysis. He has not had any hematemesis. He denies any abdominal pain or diarrhea. There is no hematochezia or melena. He denies any dysuria or hematuria. He denies any illicit drug use. Notably, the patient had TURPperformed on 11/24/2020. In the emergency department, the patient had low-grade temperature of 99.0 F. He was hemodynamically stable with oxygen saturation 99% on room air. BMP showed sodium 117 and serum creatinine 1.17. LFTs were unremarkable with lipase 28. WBC 11.0, hemoglobin 14.9, platelets 280,000. Chest x-ray showed hyperinflation without consolidation. EKG was sinus rhythm with no ST ST wave changes. Because of his generalized weakness and low sodium, the patient was admitted for further evaluation.  Hospital Course:  1-hyponatremia: -in the setting of dehydration, decrease solute intake, GI loses and volume depletion. -patient alcohol consumption also playing a role into it -Continue to maintain adequate hydration; diet has been advanced, and well-tolerated at discharge. -no neurologic deficits appreciated -Corrected and stabilized; sodium level 135 currently.  2-hypokalemia/hypomagnesemia -in the setting of poor oral intake and GI loses prior to admission -Mg  repleted and within normal limits at discharge. -Repeat basic neurological to follow electrolytes stability -No low potassium level at discharge.  3-HTN -fair control appreciated -cotninue metoprolol and amlodipine -continue to follow vital signs and further adjust antihypertensive regimen as needed. -Instruction to follow heart healthy/low-sodium diet.  4-Tobacco dependence -cessation counseling provided -nicotine patch prescribed.  5-COPD -no acute exacerbation appreciated -good O2 sat on RA -continue home bronchodilators and maintenance therapy  6-Vitamin B12 deficiency -resume monthly injection as an  outpatient -alcohol cessation discussed; as alcoholism responsible for low B12.  7-Anxiety  and depression -no SI or hallucinations -continue home Wellbutrin  8-Alcohol abuse -active withdrawal symptoms subsided  -patient is contemplating quitting and outpatient resources provided. -continue thiamine and folic acid -patient discharge on Librium to complete tapering therapy. -Patient ended requiring Precedex drip for withdrawal complications.  9-GERD -continue PPI  10-enterococcus UTI -Following sensitivity patient has been started on Augmentin. -Continue to maintain adequate hydration. -3 more days of oral antibiotics pending at discharge  11-physical deconditioning -PT has recommended a skilled nursing facility for short-term rehabilitation; patient declined that offer. -Discharged home with home health services.  Procedures:  See below for x-ray reports   Consultations:  None   Discharge Exam: Vitals:   01/02/21 0856 01/02/21 1335  BP: (!) 129/92 121/83  Pulse:  78  Resp:  18  Temp:  98.1 F (36.7 C)  SpO2:  94%    General exam: Alert, awake, oriented x 3; no chest pain, while symptomatic bilateral.  Patient reports improvement in his oral intake. Still with frequency, no dysuria. Afebrile.  Respiratory system: No significant diarrhea, no recent home although using accessory muscle.  Good saturation on room Cardiovascular system: RRR, no rubs, no gallops, no JVD Gastrointestinal system: Abdomen is nondistended, soft and nontender. No organomegaly or masses felt. Normal bowel sounds heard. Central nervous system: Alert and oriented. No focal neurological deficits. Extremities: No cyanosis, no clubbing.  Skin: No petechiae.  Psychiatry: Judgement and insight appear normal. Mood & affect appropriate.    Discharge Instructions   Discharge Instructions    Diet - low sodium heart healthy   Complete by: As directed    Discharge instructions   Complete by: As  directed    Take medications as prescribed. Maintain adequate hydration. Stop alcohol consumption and tobacco abuse. Arrange follow up visit with PCP in 10 days.     Allergies as of 01/02/2021      Reactions   Citalopram Other (See Comments)   Question of whether it was making him feel like his throat was "closed up".      Medication List    STOP taking these medications   clotrimazole 1 % cream Commonly known as: Clotrimazole Anti-Fungal   oxyCODONE-acetaminophen 5-325 MG tablet Commonly known as: PERCOCET/ROXICET     TAKE these medications   Advair Diskus 250-50 MCG/DOSE Aepb Generic drug: Fluticasone-Salmeterol Inhale 1 puff into the lungs in the morning and at bedtime.   albuterol (2.5 MG/3ML) 0.083% nebulizer solution Commonly known as: PROVENTIL INHALE 1 VIAL VIA NEBULIZER EVERY 6 HOURS AS NEEDED FOR WHEEZING OR SHORTNESS OF BREATH.   albuterol 108 (90 Base) MCG/ACT inhaler Commonly known as: Ventolin HFA Inhale 2 puffs into the lungs every 6 (six) hours as needed for wheezing.   amLODipine 10 MG tablet Commonly known as: NORVASC Take 10 mg by mouth daily.   amoxicillin-clavulanate 875-125 MG tablet Commonly known as: AUGMENTIN Take 1 tablet by mouth every 12 (twelve) hours for 3 days.   budesonide 0.5 MG/2ML nebulizer solution Commonly known as: PULMICORT Take 2 mLs (0.5 mg total) by nebulization daily.   buPROPion 300 MG 24 hr tablet Commonly known as: WELLBUTRIN XL TAKE (1) TABLET BY MOUTH ONCE DAILY.   chlordiazePOXIDE 5 MG capsule Commonly known as: LIBRIUM Take 3 capsules by mouth twice a day X 2 days; then 2 capsule by mouth twice daily x 2 days; then 1 capsule by mouth twice daily X 2 days; then one tablet daily X 3 days and stop librium.   cyanocobalamin 1000 MCG/ML  injection Commonly known as: (VITAMIN B-12) Inject 1,000 mcg into the muscle every 30 (thirty) days. What changed: Another medication with the same name was added. Make sure you  understand how and when to take each.   cyanocobalamin 1000 MCG tablet Take 1 tablet (1,000 mcg total) by mouth daily. Start taking on: January 03, 2021 What changed: You were already taking a medication with the same name, and this prescription was added. Make sure you understand how and when to take each.   feeding supplement Liqd Take 237 mLs by mouth 2 (two) times daily between meals.   folic acid 1 MG tablet Commonly known as: FOLVITE Take 1 tablet (1 mg total) by mouth daily. Start taking on: January 03, 2021   metoprolol succinate 100 MG 24 hr tablet Commonly known as: TOPROL-XL Take 1 tablet (100 mg total) by mouth daily. Take with or immediately following a meal. Start taking on: January 03, 2021 What changed:   how much to take  how to take this  when to take this  additional instructions   pantoprazole 40 MG tablet Commonly known as: PROTONIX TAKE (1) TABLET BY MOUTH ONCE DAILY. What changed: See the new instructions.   QUEtiapine 50 MG tablet Commonly known as: SEROQUEL TAKE 3 TO 4 TABLETS AT BEDTIME.   thiamine 100 MG tablet Take 1 tablet (100 mg total) by mouth daily.   Vitamin D (Ergocalciferol) 1.25 MG (50000 UNIT) Caps capsule Commonly known as: DRISDOL Take 1 capsule (50,000 Units total) by mouth every 7 (seven) days.      Allergies  Allergen Reactions  . Citalopram Other (See Comments)    Question of whether it was making him feel like his throat was "closed up".    Follow-up Information    [facility] Follow up.   Why:   [facility] will call to schedule first appointment for PT.       McGowen, Adrian Blackwater, MD. Schedule an appointment as soon as possible for a visit in 10 day(s).   Specialty: Family Medicine Contact information: [address] [place] [address] [phone]        Satira Sark, MD .   Specialty: Cardiology Contact information: [place] [address] [phone]                The results of significant diagnostics from this hospitalization (including imaging, microbiology, ancillary and laboratory) are listed below for reference.    Significant Diagnostic Studies: DG Chest Portable 1 View  Result Date: 12/27/2020 CLINICAL DATA:  Cough and shortness of breath.  History of COPD. EXAM: PORTABLE CHEST 1 VIEW COMPARISON:  PA and lateral chest 11/02/2020 and single-view of the chest 03/25/2020. FINDINGS: The lungs are emphysematous. Small focus of linear atelectasis in the left mid lung noted. No consolidative process, pneumothorax or effusion. Heart size is normal. Aortic atherosclerosis noted. IMPRESSION: No acute disease. Aortic Atherosclerosis (ICD10-I70.0) and Emphysema (ICD10-J43.9). Electronically Signed   By: Inge Rise M.D.   On: 12/27/2020 11:00   US Abdomen Limited RUQ (LIVER/GB)  Result Date: 12/27/2020 CLINICAL DATA:  Abdominal pain EXAM: ULTRASOUND ABDOMEN LIMITED RIGHT UPPER QUADRANT COMPARISON:  None. FINDINGS: Gallbladder: No gallstones or wall thickening visualized. There is no pericholecystic fluid. No sonographic Murphy sign noted by sonographer. Common bile duct: Diameter: 5 mm. No intrahepatic or extrahepatic biliary duct dilatation. Liver: No focal lesion identified. Liver contour is subtly nodular. Liver echogenicity is somewhat coarsened and  overall increased. Vein is patent on color Doppler imaging with normal direction of blood flow towards the liver. Other: There is a cyst in the right kidney measuring 1.8 x 1.8 x 1.8 cm. IMPRESSION: The appearance of the liver is felt to be indicative of a degree of hepatic cirrhosis. No focal liver lesions appreciable by ultrasound. Cyst arising from right kidney. Study otherwise unremarkable. Electronically Signed   By: Lowella Grip III M.D.   On: 12/27/2020 15:24    Microbiology: Recent Results (from the past 240 hour(s))  Resp Panel by RT-PCR (Flu A&B, Covid) Nasopharyngeal Swab     Status:  None   Collection Time: 12/27/20 10:28 AM   Specimen: Nasopharyngeal Swab; Nasopharyngeal(NP) swabs in vial transport medium  Result Value Ref Range Status   SARS Coronavirus 2 by RT PCR NEGATIVE NEGATIVE Final    Comment: (NOTE) SARS-CoV-2 target nucleic acids are NOT DETECTED.  The SARS-CoV-2 RNA is generally detectable in upper respiratory specimens during the acute phase of infection. The lowest concentration of SARS-CoV-2 viral copies this assay can detect is 138 copies/mL. A negative result does not preclude SARS-Cov-2 infection and should not be used as the sole basis for treatment or other patient management decisions. A negative result may occur with  improper specimen collection/handling, submission of specimen other than nasopharyngeal swab, presence of viral mutation(s) within the areas targeted by this assay, and inadequate number of viral copies(<138 copies/mL). A negative result must be combined with clinical observations, patient history, and epidemiological information. The expected result is Negative.  Fact Sheet for Patients:  EntrepreneurPulse.com.au  Fact Sheet for Healthcare Providers:  IncredibleEmployment.be  This test is no t yet approved or cleared by the [place] FDA and  has been authorized for detection and/or diagnosis of SARS-CoV-2 by FDA under an Emergency Use Authorization (EUA). This EUA will remain  in effect (meaning this test can be used) for the duration of the COVID-19 declaration under Section 564(b)(1) of the Act, 21 U.S.C.section 360bbb-3(b)(1), unless the authorization is terminated  or revoked sooner.       Influenza A by PCR NEGATIVE NEGATIVE Final   Influenza B by PCR NEGATIVE NEGATIVE Final    Comment: (NOTE) The Xpert Xpress SARS-CoV-2/FLU/RSV plus assay is intended as an aid in the diagnosis of influenza from Nasopharyngeal swab specimens and should not be used as a sole basis for  treatment. Nasal washings and aspirates are unacceptable for Xpert Xpress SARS-CoV-2/FLU/RSV testing.  Fact Sheet for Patients: EntrepreneurPulse.com.au  Fact Sheet for Healthcare Providers: IncredibleEmployment.be  This test is not yet approved or cleared by the [place] FDA and has been authorized for detection and/or diagnosis of SARS-CoV-2 by FDA under an Emergency Use Authorization (EUA). This EUA will remain in effect (meaning this test can be used) for the duration of the COVID-19 declaration under Section 564(b)(1) of the Act, 21 U.S.C. section 360bbb-3(b)(1), unless the authorization is terminated or revoked.  Performed at [facility], [address], [place], [place] [address]   MRSA PCR Screening     Status: None   Collection Time: 12/27/20 12:55 PM   Specimen: Nasal Mucosa; Nasopharyngeal  Result Value Ref Range Status   MRSA by PCR NEGATIVE NEGATIVE Final    Comment:        The GeneXpert MRSA Assay (FDA approved for NASAL specimens only), is one component of a comprehensive MRSA colonization surveillance program. It is not intended to diagnose MRSA infection nor to guide or monitor treatment for MRSA  infections. Performed at [facility], [address] [place], [place] [address]   Culture, Urine     Status: Abnormal   Collection Time: 12/27/20  1:42 PM   Specimen: Urine, Clean Catch  Result Value Ref Range Status   Specimen Description   Final    URINE, CLEAN CATCH Performed at [facility], [address] [place], [place] [address]    Special Requests   Final    NONE Performed at [facility], [address] [place],  [address]    Culture >=100,000 COLONIES/mL ENTEROCOCCUS FAECALIS (A)  Final   Report Status 12/29/2020 FINAL  Final   Organism ID, Bacteria ENTEROCOCCUS FAECALIS (A)  Final      Susceptibility   Enterococcus faecalis - MIC*    AMPICILLIN <=2 SENSITIVE Sensitive     NITROFURANTOIN <=16  SENSITIVE Sensitive     VANCOMYCIN 1 SENSITIVE Sensitive     * >=100,000 COLONIES/mL ENTEROCOCCUS FAECALIS     Labs: Basic Metabolic Panel: Recent Labs  Lab 12/27/20 1020 12/27/20 1310 12/27/20 1638 12/27/20 2306 12/28/20 0321 12/29/20 0529 12/30/20 0815 12/31/20 0457 01/01/21 0604 01/02/21 0504  NA 117* 119*   < > 118* 122*  121* 128* 133*  --   --  135  K 3.7 3.4*   < > 3.5 3.5  3.4* 3.2* 3.8  --   --  3.5  CL 79* 82*   < > 83* 87*  86* 92* 99  --   --  99  CO2 25 25   < > 26 23  25 25 27   --   --  25  GLUCOSE 104* 91   < > 86 79  78 85 113*  --   --  85  BUN 18 20   < > 20 18  18 20 17   --   --  15  CREATININE 1.17 1.17   < > 1.24 1.07  1.08 1.04 0.84  --   --  0.91  CALCIUM 9.2 9.0   < > 8.3* 8.5*  8.4* 8.5* 8.5*  --   --  8.6*  MG 1.7 1.6*  --   --   --  1.7  --  1.6* 1.6* 1.8  PHOS 4.0 3.9  --   --   --   --   --   --   --   --    < > = values in this interval not displayed.   Liver Function Tests: Recent Labs  Lab 12/27/20 1020 12/27/20 1310  AST 22 20  ALT 15 15  ALKPHOS 36* 32*  BILITOT 1.2 1.5*  PROT 7.1 6.6  ALBUMIN 4.2 3.9   Recent Labs  Lab 12/27/20 1020  LIPASE 28   CBC: Recent Labs  Lab 12/27/20 1020 12/27/20 1310 12/28/20 0321  WBC 11.0* 11.7* 6.7  NEUTROABS 8.3*  --   --   HGB 14.9 13.8 13.2  HCT 40.7 37.8* 37.3*  MCV 93.1 93.8 96.1  PLT 280 242 181   BNP (last 3 results) Recent Labs    03/26/20 0357  BNP 614.0*   CBG: Recent Labs  Lab 12/31/20 0728  GLUCAP 102*    Signed:  Barton Dubois MD.  [facility] 01/02/2021, 3:47 PM

## 2021-01-02 NOTE — Care Management Important Message (Signed)
Important Message  Patient Details  Name: Jacob Frank MRN: [id number] Date of Birth: [birth year]   Medicare Important Message Given:  Yes     Tommy Medal 01/02/2021, 3:22 PM

## 2021-01-03 ENCOUNTER — Telehealth: Payer: Self-pay

## 2021-01-03 NOTE — Telephone Encounter (Signed)
Transition Care Management Follow-up Telephone Call  Date of discharge and from where: 01/02/21-[facility]  How have you been since you were released from the hospital? [place] just feeling weak.  Any questions or concerns? No  Items Reviewed:  Did the pt receive and understand the discharge instructions provided? Yes   Medications obtained and verified? Yes   Other? Yes   Any new allergies since your discharge? No   Dietary orders reviewed? Yes  Do you have support at home? Yes   Home Care and Equipment/Supplies: Were home health services ordered? yes If so, what is the name of the agency? [facility]  Has the agency set up a time to come to the patient's home? yes Were any new equipment or medical supplies ordered?  No What is the name of the medical supply agency? n/a Were you able to get the supplies/equipment? not applicable Do you have any questions related to the use of the equipment or supplies? n/a  Functional Questionnaire: (I = Independent and D = Dependent) ADLs: I with assistance  Bathing/Dressing- I  Meal Prep- D  Eating- I  Maintaining continence- I  Transferring/Ambulation- I with assistance  Managing Meds- I  Follow up appointments reviewed:   PCP Hospital f/u appt confirmed? Yes  Scheduled to see Dr. Anitra Lauth on 01/11/21 @ 2:30.  [facility] f/u appt confirmed? No  Patient to call cardiology & schedule  Are transportation arrangements needed? No   If their condition worsens, is the pt aware to call PCP or go to the Emergency Dept.? Yes  Was the patient provided with contact information for the PCP's office or ED? Yes  Was to pt encouraged to call back with questions or concerns? Yes

## 2021-01-03 NOTE — Telephone Encounter (Signed)
Noted: nurse phone contact with patient for TCM. Signed:  Crissie Sickles, MD           01/03/2021

## 2021-01-06 ENCOUNTER — Telehealth: Payer: Self-pay

## 2021-01-06 ENCOUNTER — Ambulatory Visit: Payer: Medicare HMO | Admitting: Urology

## 2021-01-06 DIAGNOSIS — N138 Other obstructive and reflux uropathy: Secondary | ICD-10-CM

## 2021-01-06 NOTE — Telephone Encounter (Signed)
Please Advise

## 2021-01-06 NOTE — Telephone Encounter (Signed)
yes

## 2021-01-06 NOTE — Telephone Encounter (Signed)
Estill Bamberg from [facility] received referral for home health services for patient. Will Dr. Anitra Lauth be the attending physician of record for patient?  Please call Estill Bamberg at [phone], okay to leave message, this is a confidential voicemail.

## 2021-01-06 NOTE — Telephone Encounter (Signed)
Left message for Jacob Frank regarding [facility] for PCP to be attending physician of record.

## 2021-01-09 DIAGNOSIS — K219 Gastro-esophageal reflux disease without esophagitis: Secondary | ICD-10-CM | POA: Diagnosis not present

## 2021-01-09 DIAGNOSIS — I700 Atherosclerosis of aorta: Secondary | ICD-10-CM | POA: Diagnosis not present

## 2021-01-09 DIAGNOSIS — E871 Hypo-osmolality and hyponatremia: Secondary | ICD-10-CM | POA: Diagnosis not present

## 2021-01-09 DIAGNOSIS — F32A Depression, unspecified: Secondary | ICD-10-CM | POA: Diagnosis not present

## 2021-01-09 DIAGNOSIS — I482 Chronic atrial fibrillation, unspecified: Secondary | ICD-10-CM | POA: Diagnosis not present

## 2021-01-09 DIAGNOSIS — H905 Unspecified sensorineural hearing loss: Secondary | ICD-10-CM | POA: Diagnosis not present

## 2021-01-09 DIAGNOSIS — M6282 Rhabdomyolysis: Secondary | ICD-10-CM | POA: Diagnosis not present

## 2021-01-09 DIAGNOSIS — J439 Emphysema, unspecified: Secondary | ICD-10-CM | POA: Diagnosis not present

## 2021-01-09 DIAGNOSIS — E559 Vitamin D deficiency, unspecified: Secondary | ICD-10-CM | POA: Diagnosis not present

## 2021-01-09 DIAGNOSIS — N138 Other obstructive and reflux uropathy: Secondary | ICD-10-CM | POA: Diagnosis not present

## 2021-01-09 DIAGNOSIS — N390 Urinary tract infection, site not specified: Secondary | ICD-10-CM | POA: Diagnosis not present

## 2021-01-09 DIAGNOSIS — B952 Enterococcus as the cause of diseases classified elsewhere: Secondary | ICD-10-CM | POA: Diagnosis not present

## 2021-01-09 DIAGNOSIS — E876 Hypokalemia: Secondary | ICD-10-CM | POA: Diagnosis not present

## 2021-01-09 DIAGNOSIS — E538 Deficiency of other specified B group vitamins: Secondary | ICD-10-CM | POA: Diagnosis not present

## 2021-01-09 DIAGNOSIS — F17210 Nicotine dependence, cigarettes, uncomplicated: Secondary | ICD-10-CM | POA: Diagnosis not present

## 2021-01-09 DIAGNOSIS — I10 Essential (primary) hypertension: Secondary | ICD-10-CM | POA: Diagnosis not present

## 2021-01-09 DIAGNOSIS — Z9181 History of falling: Secondary | ICD-10-CM | POA: Diagnosis not present

## 2021-01-09 DIAGNOSIS — M159 Polyosteoarthritis, unspecified: Secondary | ICD-10-CM | POA: Diagnosis not present

## 2021-01-09 DIAGNOSIS — R338 Other retention of urine: Secondary | ICD-10-CM | POA: Diagnosis not present

## 2021-01-10 ENCOUNTER — Other Ambulatory Visit: Payer: Self-pay

## 2021-01-10 ENCOUNTER — Telehealth: Payer: Self-pay | Admitting: Family Medicine

## 2021-01-10 NOTE — Telephone Encounter (Signed)
Yes okay 

## 2021-01-10 NOTE — Telephone Encounter (Signed)
Ok for orders? 

## 2021-01-10 NOTE — Telephone Encounter (Signed)
Joey with [facility] called for verbal orders as follows: 1week 5, then 1 every 2 week 4 for endurance, strength and fall precautions. Joey's callback number is [phone].

## 2021-01-11 ENCOUNTER — Inpatient Hospital Stay: Payer: Medicaid Other | Admitting: Family Medicine
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
## 2021-01-11 NOTE — Telephone Encounter (Signed)
LM for return call regarding orders.

## 2021-01-11 NOTE — Telephone Encounter (Signed)
Left confidential voicemail for Estill Bamberg regarding PCP to be attending physician of record. Office and fax number provided as well.

## 2021-01-12 NOTE — Telephone Encounter (Signed)
Spoke with Joey and verbal orders given.  [8:43 AM] [place], [place] home health nurse calling in regards to Jacob Frank [phone]

## 2021-01-18 ENCOUNTER — Telehealth: Payer: Self-pay | Admitting: Family Medicine

## 2021-01-18 ENCOUNTER — Inpatient Hospital Stay: Payer: Medicaid Other | Admitting: Family Medicine

## 2021-01-18 NOTE — Telephone Encounter (Signed)
Placed on PCP desk to review and sign, if appropriate.  

## 2021-01-18 NOTE — Telephone Encounter (Signed)
Received faxed orders from [facility]. Placed in Dr. Idelle Leech front office inbox to be signed and faxed back.

## 2021-01-20 NOTE — Telephone Encounter (Signed)
Faxed signed orders to [facility]. Fax confirmation received.

## 2021-01-23 ENCOUNTER — Telehealth: Payer: Self-pay

## 2021-01-23 NOTE — Telephone Encounter (Signed)
Reviewed pt's chart and no recent phone call found. Spoke with pt and advised recent call was most likely regarding appt reminder for 04/28. He thanked me for the return call and will see [place] on Thursday.  [11:32 AM] Jacob Frank Hello!  Jacob Frank returning call

## 2021-01-25 DIAGNOSIS — N390 Urinary tract infection, site not specified: Secondary | ICD-10-CM | POA: Diagnosis not present

## 2021-01-25 DIAGNOSIS — E559 Vitamin D deficiency, unspecified: Secondary | ICD-10-CM | POA: Diagnosis not present

## 2021-01-25 DIAGNOSIS — E876 Hypokalemia: Secondary | ICD-10-CM | POA: Diagnosis not present

## 2021-01-25 DIAGNOSIS — K219 Gastro-esophageal reflux disease without esophagitis: Secondary | ICD-10-CM | POA: Diagnosis not present

## 2021-01-25 DIAGNOSIS — E871 Hypo-osmolality and hyponatremia: Secondary | ICD-10-CM | POA: Diagnosis not present

## 2021-01-25 DIAGNOSIS — B952 Enterococcus as the cause of diseases classified elsewhere: Secondary | ICD-10-CM | POA: Diagnosis not present

## 2021-01-25 DIAGNOSIS — R338 Other retention of urine: Secondary | ICD-10-CM | POA: Diagnosis not present

## 2021-01-25 DIAGNOSIS — H905 Unspecified sensorineural hearing loss: Secondary | ICD-10-CM | POA: Diagnosis not present

## 2021-01-25 DIAGNOSIS — M6282 Rhabdomyolysis: Secondary | ICD-10-CM | POA: Diagnosis not present

## 2021-01-25 DIAGNOSIS — F32A Depression, unspecified: Secondary | ICD-10-CM | POA: Diagnosis not present

## 2021-01-25 DIAGNOSIS — N138 Other obstructive and reflux uropathy: Secondary | ICD-10-CM | POA: Diagnosis not present

## 2021-01-25 DIAGNOSIS — Z9181 History of falling: Secondary | ICD-10-CM | POA: Diagnosis not present

## 2021-01-25 DIAGNOSIS — E538 Deficiency of other specified B group vitamins: Secondary | ICD-10-CM | POA: Diagnosis not present

## 2021-01-25 DIAGNOSIS — I700 Atherosclerosis of aorta: Secondary | ICD-10-CM | POA: Diagnosis not present

## 2021-01-25 DIAGNOSIS — F17210 Nicotine dependence, cigarettes, uncomplicated: Secondary | ICD-10-CM | POA: Diagnosis not present

## 2021-01-25 DIAGNOSIS — I10 Essential (primary) hypertension: Secondary | ICD-10-CM | POA: Diagnosis not present

## 2021-01-25 DIAGNOSIS — J439 Emphysema, unspecified: Secondary | ICD-10-CM | POA: Diagnosis not present

## 2021-01-25 DIAGNOSIS — M159 Polyosteoarthritis, unspecified: Secondary | ICD-10-CM | POA: Diagnosis not present

## 2021-01-25 DIAGNOSIS — I482 Chronic atrial fibrillation, unspecified: Secondary | ICD-10-CM | POA: Diagnosis not present

## 2021-01-26 ENCOUNTER — Encounter: Payer: Self-pay | Admitting: Family Medicine

## 2021-01-26 ENCOUNTER — Ambulatory Visit (INDEPENDENT_AMBULATORY_CARE_PROVIDER_SITE_OTHER): Payer: Medicare Other | Admitting: Family Medicine

## 2021-01-26 ENCOUNTER — Other Ambulatory Visit: Payer: Self-pay | Admitting: Family Medicine

## 2021-01-26 ENCOUNTER — Other Ambulatory Visit: Payer: Self-pay

## 2021-01-26 VITALS — BP 160/100 | HR 76 | Temp 97.1°F | Ht 69.0 in | Wt 184.4 lb

## 2021-01-26 DIAGNOSIS — F419 Anxiety disorder, unspecified: Secondary | ICD-10-CM | POA: Diagnosis not present

## 2021-01-26 DIAGNOSIS — E538 Deficiency of other specified B group vitamins: Secondary | ICD-10-CM | POA: Diagnosis not present

## 2021-01-26 DIAGNOSIS — F1020 Alcohol dependence, uncomplicated: Secondary | ICD-10-CM

## 2021-01-26 DIAGNOSIS — J449 Chronic obstructive pulmonary disease, unspecified: Secondary | ICD-10-CM

## 2021-01-26 DIAGNOSIS — E441 Mild protein-calorie malnutrition: Secondary | ICD-10-CM

## 2021-01-26 DIAGNOSIS — Z87440 Personal history of urinary (tract) infections: Secondary | ICD-10-CM | POA: Diagnosis not present

## 2021-01-26 DIAGNOSIS — K7030 Alcoholic cirrhosis of liver without ascites: Secondary | ICD-10-CM

## 2021-01-26 DIAGNOSIS — E871 Hypo-osmolality and hyponatremia: Secondary | ICD-10-CM | POA: Diagnosis not present

## 2021-01-26 DIAGNOSIS — F32A Depression, unspecified: Secondary | ICD-10-CM

## 2021-01-26 DIAGNOSIS — I10 Essential (primary) hypertension: Secondary | ICD-10-CM

## 2021-01-26 LAB — COMPREHENSIVE METABOLIC PANEL
ALT: 13 U/L (ref 0–53)
AST: 14 U/L (ref 0–37)
Albumin: 4.4 g/dL (ref 3.5–5.2)
Alkaline Phosphatase: 36 U/L — ABNORMAL LOW (ref 39–117)
BUN: 12 mg/dL (ref 6–23)
CO2: 32 mEq/L (ref 19–32)
Calcium: 9.4 mg/dL (ref 8.4–10.5)
Chloride: 95 mEq/L — ABNORMAL LOW (ref 96–112)
Creatinine, Ser: 1.07 mg/dL (ref 0.40–1.50)
GFR: 71.32 mL/min (ref >60.00)
Glucose, Bld: 66 mg/dL — ABNORMAL LOW (ref 70–99)
Potassium: 4 mEq/L (ref 3.5–5.1)
Sodium: 134 mEq/L — ABNORMAL LOW (ref 135–145)
Total Bilirubin: 0.5 mg/dL (ref 0.2–1.2)
Total Protein: 6.9 g/dL (ref 6.0–8.3)

## 2021-01-26 LAB — MAGNESIUM: Magnesium: 1.9 mg/dL (ref 1.5–2.5)

## 2021-01-26 MED ORDER — SERTRALINE HCL 50 MG PO TABS: 50.0000 mg | ORAL_TABLET | Freq: Every day | ORAL | 1 refills | Status: DC

## 2021-01-26 MED ORDER — METOPROLOL SUCCINATE ER 100 MG PO TB24: ORAL_TABLET | ORAL | 1 refills | Status: DC

## 2021-01-26 MED ORDER — CYANOCOBALAMIN 1000 MCG/ML IJ SOLN
1000.0000 ug | Freq: Once | INTRAMUSCULAR | Status: AC
  Administered 2021-01-26: 1000 ug via INTRAMUSCULAR

## 2021-01-26 NOTE — Patient Instructions (Signed)
Take tamsulosin 0.4mg  tab if you start to have trouble emptying your bladder.  Start taking one 81mg  Aspirin every day.
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
## 2021-01-27 ENCOUNTER — Telehealth: Payer: Self-pay

## 2021-01-27 MED ORDER — NYSTATIN 100000 UNIT/GM EX CREA: TOPICAL_CREAM | CUTANEOUS | 1 refills | Status: DC

## 2021-01-27 NOTE — Telephone Encounter (Signed)
Nystatin cream eRx'd today.

## 2021-01-27 NOTE — Telephone Encounter (Signed)
Spoke with patient regarding results/recommendations.  

## 2021-01-27 NOTE — Telephone Encounter (Signed)
Pt called stating he forgot to mention in his hospital follow up visit with PCP yesterday he has a rash in his groin area that seems to be infected, red and sore.  He has contacted his pharmacy, [facility] in [place] as well.  He is wanting something to be called into his pharmacy for this.

## 2021-01-27 NOTE — Telephone Encounter (Signed)
LVM for pt to CB

## 2021-01-31 DIAGNOSIS — N138 Other obstructive and reflux uropathy: Secondary | ICD-10-CM | POA: Diagnosis not present

## 2021-01-31 DIAGNOSIS — J439 Emphysema, unspecified: Secondary | ICD-10-CM | POA: Diagnosis not present

## 2021-01-31 DIAGNOSIS — R338 Other retention of urine: Secondary | ICD-10-CM | POA: Diagnosis not present

## 2021-01-31 DIAGNOSIS — K219 Gastro-esophageal reflux disease without esophagitis: Secondary | ICD-10-CM | POA: Diagnosis not present

## 2021-01-31 DIAGNOSIS — I482 Chronic atrial fibrillation, unspecified: Secondary | ICD-10-CM | POA: Diagnosis not present

## 2021-01-31 DIAGNOSIS — E876 Hypokalemia: Secondary | ICD-10-CM | POA: Diagnosis not present

## 2021-01-31 DIAGNOSIS — N390 Urinary tract infection, site not specified: Secondary | ICD-10-CM | POA: Diagnosis not present

## 2021-01-31 DIAGNOSIS — H905 Unspecified sensorineural hearing loss: Secondary | ICD-10-CM | POA: Diagnosis not present

## 2021-01-31 DIAGNOSIS — F32A Depression, unspecified: Secondary | ICD-10-CM | POA: Diagnosis not present

## 2021-01-31 DIAGNOSIS — I700 Atherosclerosis of aorta: Secondary | ICD-10-CM | POA: Diagnosis not present

## 2021-01-31 DIAGNOSIS — F17210 Nicotine dependence, cigarettes, uncomplicated: Secondary | ICD-10-CM | POA: Diagnosis not present

## 2021-01-31 DIAGNOSIS — M159 Polyosteoarthritis, unspecified: Secondary | ICD-10-CM | POA: Diagnosis not present

## 2021-01-31 DIAGNOSIS — B952 Enterococcus as the cause of diseases classified elsewhere: Secondary | ICD-10-CM | POA: Diagnosis not present

## 2021-01-31 DIAGNOSIS — E871 Hypo-osmolality and hyponatremia: Secondary | ICD-10-CM | POA: Diagnosis not present

## 2021-01-31 DIAGNOSIS — E559 Vitamin D deficiency, unspecified: Secondary | ICD-10-CM | POA: Diagnosis not present

## 2021-01-31 DIAGNOSIS — E538 Deficiency of other specified B group vitamins: Secondary | ICD-10-CM | POA: Diagnosis not present

## 2021-01-31 DIAGNOSIS — Z9181 History of falling: Secondary | ICD-10-CM | POA: Diagnosis not present

## 2021-01-31 DIAGNOSIS — M6282 Rhabdomyolysis: Secondary | ICD-10-CM | POA: Diagnosis not present

## 2021-01-31 DIAGNOSIS — I10 Essential (primary) hypertension: Secondary | ICD-10-CM | POA: Diagnosis not present

## 2021-02-20 ENCOUNTER — Other Ambulatory Visit: Payer: Self-pay | Admitting: Family Medicine

## 2021-02-28 ENCOUNTER — Other Ambulatory Visit: Payer: Self-pay

## 2021-02-28 ENCOUNTER — Other Ambulatory Visit: Payer: Self-pay | Admitting: Family Medicine

## 2021-03-02 ENCOUNTER — Other Ambulatory Visit: Payer: Self-pay

## 2021-03-02 ENCOUNTER — Encounter: Payer: Self-pay | Admitting: Family Medicine

## 2021-03-02 ENCOUNTER — Ambulatory Visit (INDEPENDENT_AMBULATORY_CARE_PROVIDER_SITE_OTHER): Payer: Medicare Other | Admitting: Family Medicine

## 2021-03-02 VITALS — BP 131/76 | HR 82 | Temp 97.3°F | Ht 69.0 in | Wt 191.0 lb

## 2021-03-02 DIAGNOSIS — I10 Essential (primary) hypertension: Secondary | ICD-10-CM | POA: Diagnosis not present

## 2021-03-02 DIAGNOSIS — J449 Chronic obstructive pulmonary disease, unspecified: Secondary | ICD-10-CM

## 2021-03-02 DIAGNOSIS — F32A Depression, unspecified: Secondary | ICD-10-CM | POA: Diagnosis not present

## 2021-03-02 DIAGNOSIS — F1020 Alcohol dependence, uncomplicated: Secondary | ICD-10-CM | POA: Diagnosis not present

## 2021-03-02 DIAGNOSIS — F419 Anxiety disorder, unspecified: Secondary | ICD-10-CM | POA: Diagnosis not present

## 2021-03-02 DIAGNOSIS — E538 Deficiency of other specified B group vitamins: Secondary | ICD-10-CM | POA: Diagnosis not present

## 2021-03-02 DIAGNOSIS — I480 Paroxysmal atrial fibrillation: Secondary | ICD-10-CM

## 2021-03-02 MED ORDER — BUPROPION HCL ER (XL) 300 MG PO TB24: ORAL_TABLET | ORAL | 1 refills | Status: DC

## 2021-03-02 MED ORDER — CYANOCOBALAMIN 1000 MCG/ML IJ SOLN
1000.0000 ug | Freq: Once | INTRAMUSCULAR | Status: AC
  Administered 2021-03-02: 1000 ug via INTRAMUSCULAR

## 2021-03-02 MED ORDER — SERTRALINE HCL 100 MG PO TABS: 100.0000 mg | ORAL_TABLET | Freq: Every day | ORAL | 1 refills | Status: DC

## 2021-03-02 MED ORDER — PANTOPRAZOLE SODIUM 40 MG PO TBEC: DELAYED_RELEASE_TABLET | ORAL | 1 refills | Status: DC

## 2021-03-02 MED ORDER — ENSURE ENLIVE PO LIQD: 237.0000 mL | Freq: Two times a day (BID) | ORAL | 12 refills | Status: DC

## 2021-03-02 MED ORDER — METOPROLOL SUCCINATE ER 100 MG PO TB24: ORAL_TABLET | ORAL | 1 refills | Status: DC

## 2021-03-02 MED ORDER — QUETIAPINE FUMARATE 50 MG PO TABS: ORAL_TABLET | ORAL | 5 refills | Status: DC

## 2021-03-02 NOTE — Patient Instructions (Signed)
Take TWO of your 50mg  sertraline tabs every day until you run out. Then start sertraline 100mg  tab 1 per day.
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
## 2021-03-29 ENCOUNTER — Telehealth: Payer: Self-pay | Admitting: Family Medicine

## 2021-03-29 NOTE — Telephone Encounter (Signed)
Attempted to schedule AWV. Unable to LVM.  Will try at later time.  

## 2021-04-21 ENCOUNTER — Other Ambulatory Visit: Payer: Self-pay | Admitting: Family Medicine

## 2021-04-21 NOTE — Telephone Encounter (Signed)
Please advise if okay? Not prescribed by you

## 2021-04-25 ENCOUNTER — Other Ambulatory Visit: Payer: Self-pay | Admitting: Family Medicine

## 2021-04-26 ENCOUNTER — Ambulatory Visit (INDEPENDENT_AMBULATORY_CARE_PROVIDER_SITE_OTHER): Payer: Medicare Other | Admitting: Family Medicine

## 2021-04-26 ENCOUNTER — Encounter: Payer: Self-pay | Admitting: Family Medicine

## 2021-04-26 ENCOUNTER — Other Ambulatory Visit: Payer: Self-pay

## 2021-04-26 VITALS — BP 123/83 | HR 74 | Temp 97.8°F | Ht 69.0 in | Wt 189.4 lb

## 2021-04-26 DIAGNOSIS — J42 Unspecified chronic bronchitis: Secondary | ICD-10-CM

## 2021-04-26 DIAGNOSIS — F339 Major depressive disorder, recurrent, unspecified: Secondary | ICD-10-CM | POA: Diagnosis not present

## 2021-04-26 DIAGNOSIS — F1020 Alcohol dependence, uncomplicated: Secondary | ICD-10-CM

## 2021-04-26 DIAGNOSIS — F411 Generalized anxiety disorder: Secondary | ICD-10-CM

## 2021-04-26 DIAGNOSIS — I480 Paroxysmal atrial fibrillation: Secondary | ICD-10-CM

## 2021-04-26 DIAGNOSIS — E538 Deficiency of other specified B group vitamins: Secondary | ICD-10-CM

## 2021-04-26 MED ORDER — QUETIAPINE FUMARATE 400 MG PO TABS: 400.0000 mg | ORAL_TABLET | Freq: Every day | ORAL | 1 refills | Status: DC

## 2021-04-26 MED ORDER — CYANOCOBALAMIN 1000 MCG/ML IJ SOLN
1000.0000 ug | Freq: Once | INTRAMUSCULAR | Status: AC
  Administered 2021-04-26: 1000 ug via INTRAMUSCULAR
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
## 2021-05-24 ENCOUNTER — Encounter: Payer: Self-pay | Admitting: Family Medicine

## 2021-05-24 ENCOUNTER — Ambulatory Visit (INDEPENDENT_AMBULATORY_CARE_PROVIDER_SITE_OTHER): Payer: Medicare Other | Admitting: Family Medicine

## 2021-05-24 ENCOUNTER — Other Ambulatory Visit: Payer: Self-pay

## 2021-05-24 VITALS — BP 131/86 | HR 82 | Temp 98.1°F | Wt 196.2 lb

## 2021-05-24 DIAGNOSIS — F411 Generalized anxiety disorder: Secondary | ICD-10-CM

## 2021-05-24 DIAGNOSIS — J449 Chronic obstructive pulmonary disease, unspecified: Secondary | ICD-10-CM

## 2021-05-24 DIAGNOSIS — F1020 Alcohol dependence, uncomplicated: Secondary | ICD-10-CM | POA: Diagnosis not present

## 2021-05-24 DIAGNOSIS — F329 Major depressive disorder, single episode, unspecified: Secondary | ICD-10-CM | POA: Diagnosis not present

## 2021-05-24 DIAGNOSIS — E538 Deficiency of other specified B group vitamins: Secondary | ICD-10-CM | POA: Diagnosis not present

## 2021-05-24 DIAGNOSIS — Z7689 Persons encountering health services in other specified circumstances: Secondary | ICD-10-CM | POA: Diagnosis not present

## 2021-05-24 MED ORDER — FLUTICASONE-SALMETEROL 500-50 MCG/ACT IN AEPB: 1.0000 | INHALATION_SPRAY | Freq: Two times a day (BID) | RESPIRATORY_TRACT | 3 refills | Status: DC

## 2021-05-24 MED ORDER — QUETIAPINE FUMARATE 200 MG PO TABS: ORAL_TABLET | ORAL | 1 refills | Status: DC

## 2021-05-24 MED ORDER — CYANOCOBALAMIN 1000 MCG/ML IJ SOLN
1000.0000 ug | Freq: Once | INTRAMUSCULAR | Status: AC
  Administered 2021-05-24: 1000 ug via INTRAMUSCULAR

## 2021-05-24 MED ORDER — QUETIAPINE FUMARATE 400 MG PO TABS: 400.0000 mg | ORAL_TABLET | Freq: Every day | ORAL | 3 refills | Status: DC
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
## 2021-06-07 ENCOUNTER — Telehealth: Payer: Self-pay | Admitting: Family Medicine

## 2021-06-07 NOTE — Telephone Encounter (Signed)
He already has a cardiologist that he can just call and schedule an appointment with: Dr. Rozann Lesches, phone [phone].

## 2021-06-07 NOTE — Telephone Encounter (Signed)
Patient states when he was recently in the hospital, he was told to followup with a Cardiologist. Patient does not like coming in the office. Please advise if we can enter Cardiology referral from the hospital information.

## 2021-06-07 NOTE — Telephone Encounter (Signed)
Please advise 

## 2021-06-08 NOTE — Telephone Encounter (Signed)
LM for pt to return call. Please see message below

## 2021-06-09 NOTE — Telephone Encounter (Signed)
LM for pt to returncall

## 2021-06-09 NOTE — Telephone Encounter (Signed)
Pt returned call and advised to schedule f/u appt with Dr.McDowell.
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
## 2021-06-13 ENCOUNTER — Telehealth: Payer: Self-pay | Admitting: Family Medicine

## 2021-06-13 ENCOUNTER — Encounter: Payer: Self-pay | Admitting: *Deleted

## 2021-06-13 ENCOUNTER — Ambulatory Visit (INDEPENDENT_AMBULATORY_CARE_PROVIDER_SITE_OTHER): Payer: Medicare Other | Admitting: Family Medicine

## 2021-06-13 ENCOUNTER — Other Ambulatory Visit: Payer: Self-pay

## 2021-06-13 ENCOUNTER — Encounter: Payer: Self-pay | Admitting: Family Medicine

## 2021-06-13 VITALS — BP 110/80 | HR 79 | Ht 69.0 in | Wt 203.2 lb

## 2021-06-13 DIAGNOSIS — R600 Localized edema: Secondary | ICD-10-CM | POA: Diagnosis not present

## 2021-06-13 DIAGNOSIS — F17200 Nicotine dependence, unspecified, uncomplicated: Secondary | ICD-10-CM | POA: Diagnosis not present

## 2021-06-13 DIAGNOSIS — R079 Chest pain, unspecified: Secondary | ICD-10-CM | POA: Diagnosis not present

## 2021-06-13 DIAGNOSIS — I4891 Unspecified atrial fibrillation: Secondary | ICD-10-CM | POA: Diagnosis not present

## 2021-06-13 DIAGNOSIS — R0602 Shortness of breath: Secondary | ICD-10-CM | POA: Diagnosis not present

## 2021-06-13 DIAGNOSIS — I10 Essential (primary) hypertension: Secondary | ICD-10-CM | POA: Diagnosis not present

## 2021-06-13 DIAGNOSIS — R29818 Other symptoms and signs involving the nervous system: Secondary | ICD-10-CM | POA: Diagnosis not present

## 2021-06-13 MED ORDER — FUROSEMIDE 20 MG PO TABS
10.0000 mg | ORAL_TABLET | Freq: Every day | ORAL | 6 refills | Status: DC
Start: 2021-06-13 — End: 2021-10-18

## 2021-06-13 NOTE — Telephone Encounter (Signed)
Checking percert on the following patient for testing scheduled at [facility].    LEXISCAN    06-20-21  ECHO  - 07/07/2021

## 2021-06-13 NOTE — Patient Instructions (Addendum)
Medication Instructions:  Begin Lasix '10mg'$  daily.  Continue all other medications.     Labwork: BMET, Mg - orders given today.  Please do in 1 week at [facility] (around 9/20).  Testing/Procedures: Your physician has requested that you have an echocardiogram. Echocardiography is a painless test that uses sound waves to create images of your heart. It provides your doctor with information about the size and shape of your heart and how well your heart's chambers and valves are working. This procedure takes approximately one hour. There are no restrictions for this procedure. Your physician has requested that you have a lexiscan myoview. For further information please visit [email or web address]. Please follow instruction sheet, as given.  Follow-Up: 3 months  Office will contact with results via phone or letter.     Any Other Special Instructions Will Be Listed Below (If Applicable). You have been referred to:  Pulmonary   If you need a refill on your cardiac medications before your next appointment, please call your pharmacy.

## 2021-06-14 ENCOUNTER — Other Ambulatory Visit: Payer: Self-pay | Admitting: Family Medicine

## 2021-06-15 ENCOUNTER — Encounter: Payer: Self-pay | Admitting: Family Medicine

## 2021-06-20 ENCOUNTER — Ambulatory Visit (HOSPITAL_COMMUNITY)
Admission: RE | Admit: 2021-06-20 | Discharge: 2021-06-20 | Disposition: A | Discharge status: Home / Self Care | Payer: Medicare Other | Source: Ambulatory Visit | Attending: Family Medicine | Admitting: Family Medicine

## 2021-06-20 ENCOUNTER — Other Ambulatory Visit: Payer: Self-pay

## 2021-06-20 ENCOUNTER — Encounter (HOSPITAL_COMMUNITY): Payer: Self-pay

## 2021-06-20 ENCOUNTER — Encounter (HOSPITAL_COMMUNITY)
Admission: RE | Admit: 2021-06-20 | Discharge: 2021-06-20 | Disposition: A | Discharge status: Home / Self Care | Payer: Medicare Other | Source: Ambulatory Visit | Attending: Family Medicine | Admitting: Family Medicine

## 2021-06-20 DIAGNOSIS — R079 Chest pain, unspecified: Secondary | ICD-10-CM | POA: Insufficient documentation

## 2021-06-25 ENCOUNTER — Telehealth: Payer: Self-pay

## 2021-06-25 NOTE — Telephone Encounter (Signed)
  Medicaid Managed Care   Unsuccessful Attempt Note   06/25/2021 Name: Jacob Frank MRN: [id number] DOB: 04/26/1951   Reason for call : assist with scheduling AWV Phone number listed in Epic not available.   Follow Up Plan: Will send message to PCP   [facility]

## 2021-06-26 ENCOUNTER — Other Ambulatory Visit: Payer: Self-pay | Admitting: Family Medicine

## 2021-06-29 ENCOUNTER — Encounter (HOSPITAL_COMMUNITY)
Admission: RE | Admit: 2021-06-29 | Discharge: 2021-06-29 | Disposition: A | Discharge status: Home / Self Care | Payer: Medicare Other | Source: Ambulatory Visit | Attending: Family Medicine | Admitting: Family Medicine

## 2021-06-29 ENCOUNTER — Other Ambulatory Visit: Payer: Self-pay

## 2021-06-29 ENCOUNTER — Encounter (HOSPITAL_COMMUNITY): Payer: Self-pay

## 2021-06-29 ENCOUNTER — Ambulatory Visit (HOSPITAL_COMMUNITY)
Admission: RE | Admit: 2021-06-29 | Discharge: 2021-06-29 | Disposition: A | Discharge status: Home / Self Care | Payer: Medicare Other | Source: Ambulatory Visit | Attending: Family Medicine | Admitting: Family Medicine

## 2021-06-29 DIAGNOSIS — R079 Chest pain, unspecified: Secondary | ICD-10-CM | POA: Diagnosis not present

## 2021-06-29 HISTORY — PX: CARDIOVASCULAR STRESS TEST: SHX262

## 2021-06-29 HISTORY — DX: Heart failure, unspecified: I50.9

## 2021-06-29 HISTORY — DX: Unspecified asthma, uncomplicated: J45.909

## 2021-06-29 HISTORY — DX: Malignant (primary) neoplasm, unspecified: C80.1

## 2021-06-29 LAB — NM MYOCAR MULTI W/SPECT W/WALL MOTION / EF
LV dias vol: 89 mL (ref 62–150)
LV sys vol: 30 mL
Nuc Stress EF: 66 %
Peak HR: 112 {beats}/min
RATE: 0.4
Rest HR: 100 {beats}/min
Rest Nuclear Isotope Dose: 9.5 mCi
SDS: 2
SRS: 0
SSS: 2
ST Depression (mm): 0 mm
Stress Nuclear Isotope Dose: 32.5 mCi
TID: 1

## 2021-06-29 MED ORDER — SODIUM CHLORIDE FLUSH 0.9 % IV SOLN
INTRAVENOUS | Status: AC
  Administered 2021-06-29: 10 mL via INTRAVENOUS
  Filled 2021-06-29: qty 10

## 2021-06-29 MED ORDER — TECHNETIUM TC 99M TETROFOSMIN IV KIT
10.0000 | PACK | Freq: Once | INTRAVENOUS | Status: AC | PRN
  Administered 2021-06-29: 9.5 via INTRAVENOUS

## 2021-06-29 MED ORDER — TECHNETIUM TC 99M TETROFOSMIN IV KIT
30.0000 | PACK | Freq: Once | INTRAVENOUS | Status: AC | PRN
  Administered 2021-06-29: 32.5 via INTRAVENOUS

## 2021-06-29 MED ORDER — REGADENOSON 0.4 MG/5ML IV SOLN
INTRAVENOUS | Status: AC
  Administered 2021-06-29: 0.4 mg via INTRAVENOUS
  Filled 2021-06-29: qty 5

## 2021-07-03 ENCOUNTER — Encounter: Payer: Self-pay | Admitting: Family Medicine

## 2021-07-03 ENCOUNTER — Telehealth: Payer: Self-pay | Admitting: *Deleted

## 2021-07-03 NOTE — Telephone Encounter (Signed)
-----   Message from Verta Ellen., NP sent at 06/29/2021  8:04 PM EDT ----- Please call the patient and let him know the stress test was normal per the physician who interpreted the test.   Verta Ellen, NP  06/29/2021 8:02 PM

## 2021-07-03 NOTE — Telephone Encounter (Signed)
Laurine Blazer, LPN  91/03/9149  5:69 AM EDT Back to Top    Notified, copy to pcp.

## 2021-07-06 ENCOUNTER — Ambulatory Visit: Payer: Medicare Other | Admitting: Family Medicine
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
## 2021-07-07 ENCOUNTER — Ambulatory Visit (HOSPITAL_COMMUNITY)
Admission: RE | Admit: 2021-07-07 | Discharge: 2021-07-07 | Disposition: A | Discharge status: Home / Self Care | Payer: Medicare Other | Source: Ambulatory Visit | Attending: Family Medicine | Admitting: Family Medicine

## 2021-07-07 DIAGNOSIS — R0602 Shortness of breath: Secondary | ICD-10-CM | POA: Diagnosis not present

## 2021-07-07 LAB — ECHOCARDIOGRAM COMPLETE
Area-P 1/2: 5.13 cm2
S' Lateral: 2.6 cm

## 2021-07-07 NOTE — Progress Notes (Signed)
*  PRELIMINARY RESULTS* Echocardiogram 2D Echocardiogram has been performed.  Jacob Frank 07/07/2021, 9:15 AM

## 2021-07-12 ENCOUNTER — Other Ambulatory Visit: Payer: Self-pay

## 2021-07-12 ENCOUNTER — Encounter: Payer: Self-pay | Admitting: Family Medicine

## 2021-07-12 ENCOUNTER — Ambulatory Visit (INDEPENDENT_AMBULATORY_CARE_PROVIDER_SITE_OTHER): Payer: Medicare Other | Admitting: Family Medicine

## 2021-07-12 VITALS — BP 112/75 | HR 114 | Temp 97.5°F | Ht 69.0 in | Wt 208.0 lb

## 2021-07-12 DIAGNOSIS — E538 Deficiency of other specified B group vitamins: Secondary | ICD-10-CM | POA: Diagnosis not present

## 2021-07-12 DIAGNOSIS — I10 Essential (primary) hypertension: Secondary | ICD-10-CM

## 2021-07-12 DIAGNOSIS — R600 Localized edema: Secondary | ICD-10-CM

## 2021-07-12 DIAGNOSIS — Z23 Encounter for immunization: Secondary | ICD-10-CM | POA: Diagnosis not present

## 2021-07-12 DIAGNOSIS — J449 Chronic obstructive pulmonary disease, unspecified: Secondary | ICD-10-CM

## 2021-07-12 DIAGNOSIS — I480 Paroxysmal atrial fibrillation: Secondary | ICD-10-CM

## 2021-07-12 LAB — COMPREHENSIVE METABOLIC PANEL
ALT: 10 U/L (ref 0–53)
AST: 14 U/L (ref 0–37)
Albumin: 4.5 g/dL (ref 3.5–5.2)
Alkaline Phosphatase: 48 U/L (ref 39–117)
BUN: 13 mg/dL (ref 6–23)
CO2: 26 mEq/L (ref 19–32)
Calcium: 9.3 mg/dL (ref 8.4–10.5)
Chloride: 97 mEq/L (ref 96–112)
Creatinine, Ser: 0.99 mg/dL (ref 0.40–1.50)
GFR: 78.04 mL/min (ref >60.00)
Glucose, Bld: 113 mg/dL — ABNORMAL HIGH (ref 70–99)
Potassium: 4 mEq/L (ref 3.5–5.1)
Sodium: 133 mEq/L — ABNORMAL LOW (ref 135–145)
Total Bilirubin: 0.5 mg/dL (ref 0.2–1.2)
Total Protein: 7.1 g/dL (ref 6.0–8.3)

## 2021-07-12 MED ORDER — NYSTATIN 100000 UNIT/GM EX CREA: TOPICAL_CREAM | CUTANEOUS | 1 refills | Status: DC

## 2021-07-12 MED ORDER — ALBUTEROL SULFATE HFA 108 (90 BASE) MCG/ACT IN AERS: INHALATION_SPRAY | RESPIRATORY_TRACT | 0 refills | Status: DC

## 2021-07-12 MED ORDER — AMLODIPINE BESYLATE 10 MG PO TABS: ORAL_TABLET | ORAL | 1 refills | Status: DC

## 2021-07-12 MED ORDER — THIAMINE HCL 100 MG PO TABS: 100.0000 mg | ORAL_TABLET | Freq: Every day | ORAL | 1 refills | Status: DC

## 2021-07-12 MED ORDER — BEVESPI AEROSPHERE 9-4.8 MCG/ACT IN AERO: INHALATION_SPRAY | RESPIRATORY_TRACT | 3 refills | Status: DC

## 2021-07-12 MED ORDER — ALBUTEROL SULFATE (2.5 MG/3ML) 0.083% IN NEBU: INHALATION_SOLUTION | RESPIRATORY_TRACT | 0 refills | Status: DC

## 2021-07-12 MED ORDER — METOPROLOL SUCCINATE ER 100 MG PO TB24: ORAL_TABLET | ORAL | 1 refills | Status: DC

## 2021-07-12 MED ORDER — PANTOPRAZOLE SODIUM 40 MG PO TBEC: DELAYED_RELEASE_TABLET | ORAL | 1 refills | Status: DC

## 2021-07-12 MED ORDER — QUETIAPINE FUMARATE 400 MG PO TABS: ORAL_TABLET | ORAL | 1 refills | Status: DC

## 2021-07-12 MED ORDER — CYANOCOBALAMIN 1000 MCG/ML IJ SOLN
1000.0000 ug | Freq: Once | INTRAMUSCULAR | Status: AC
  Administered 2021-07-12: 1000 ug via INTRAMUSCULAR

## 2021-07-12 MED ORDER — COMPRESSOR/NEBULIZER MISC
0 refills | Status: AC
Start: 2021-07-12 — End: ?
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
## 2021-07-20 ENCOUNTER — Telehealth: Payer: Self-pay | Admitting: *Deleted

## 2021-07-20 ENCOUNTER — Encounter: Payer: Self-pay | Admitting: Family Medicine

## 2021-07-20 ENCOUNTER — Other Ambulatory Visit: Payer: Self-pay | Admitting: Family Medicine

## 2021-07-20 NOTE — Telephone Encounter (Signed)
Laurine Blazer, LPN  69/22/3009  7:94 PM EDT Back to Top    Wife Mardene Celeste) notified, copy to pcp.    Laurine Blazer, LPN  99/71/8209  9:06 PM EDT     Not available.    Laurine Blazer, LPN  89/11/4066  4:03 PM EDT     Left message to return call.   Verta Ellen., NP  07/07/2021  1:01 PM EDT     His echocardiogram shows he has good pumping function of the heart.  He has a trivial leak of the mitral valve and aortic valve.  Nothing that would contribute to significant shortness of breath.  He does have COPD and current smoker.  Shortness of breath is more than likely related to COPD and long history of smoking. Verta Ellen, NP  07/07/2021 1:00 PM

## 2021-08-14 ENCOUNTER — Institutional Professional Consult (permissible substitution): Payer: Medicare Other | Admitting: Pulmonary Disease

## 2021-08-31 ENCOUNTER — Telehealth: Payer: Self-pay | Admitting: Family Medicine

## 2021-08-31 MED ORDER — ALBUTEROL SULFATE HFA 108 (90 BASE) MCG/ACT IN AERS: INHALATION_SPRAY | RESPIRATORY_TRACT | 0 refills | Status: DC

## 2021-08-31 MED ORDER — BEVESPI AEROSPHERE 9-4.8 MCG/ACT IN AERO: INHALATION_SPRAY | RESPIRATORY_TRACT | 0 refills | Status: DC

## 2021-08-31 NOTE — Telephone Encounter (Signed)
Please fill, if appropriate. Meds pending

## 2021-08-31 NOTE — Telephone Encounter (Signed)
Pt is visiting [place] and needing refills on his inhalers.   Glycopyrrolate-Formoterol (BEVESPI AEROSPHERE) 9-4.8 MCG/ACT AERO albuterol (PROAIR HFA) 108 (90 Base) MCG/ACT inhaler   Last OV 07/12/2021 Pt requesting call back if/when RX's are sent to pharmacy:  [place], [place] Phone:  [phone]  Fax:  [phone]     Updated ph # to [phone] per pt

## 2021-09-01 NOTE — Telephone Encounter (Signed)
Pt advised refills sent. °

## 2021-09-01 NOTE — Telephone Encounter (Signed)
Patient is out of inhaler.  He is visiting is brother in [place] for the holidays.  Jacob Frank scheduled appt with Dr. Anitra Lauth  10/18/21 at 11:00AM  [place], [place]

## 2021-09-18 ENCOUNTER — Ambulatory Visit: Payer: Medicare Other | Admitting: Family Medicine

## 2021-09-27 ENCOUNTER — Telehealth: Payer: Self-pay | Admitting: Family Medicine

## 2021-09-27 NOTE — Telephone Encounter (Signed)
Pt called needing refill for   QUEtiapine (SEROQUEL) 400 MG tablet  [place], [place] Phone:  [phone]  Fax:  [phone]

## 2021-09-27 NOTE — Telephone Encounter (Signed)
Pt calling again about status for   QUEtiapine (SEROQUEL) 400 MG tablet Told pt someone will contact him.

## 2021-09-27 NOTE — Telephone Encounter (Signed)
Sorry, cannot do this

## 2021-09-27 NOTE — Telephone Encounter (Signed)
RF request for Seroquel LOV: 07/12/21 Next ov: 10/18/21 Last written: 07/12/21(60,1)  Please confirm if rx can be refilled due to out of state pharmacy.

## 2021-09-28 NOTE — Telephone Encounter (Signed)
LM for pt regarding refill

## 2021-10-03 ENCOUNTER — Other Ambulatory Visit: Payer: Self-pay | Admitting: Family Medicine

## 2021-10-03 NOTE — Telephone Encounter (Signed)
Attempted to contact pt in regards to medication refill. Unable to LVM.

## 2021-10-03 NOTE — Telephone Encounter (Signed)
Patient back in [place].  Can refill be sent to [place]?  Patient is scheduled to see Dr. Anitra Lauth on 1/18  QUEtiapine (SEROQUEL) 400 MG tablet [[id number]]

## 2021-10-04 ENCOUNTER — Other Ambulatory Visit: Payer: Self-pay

## 2021-10-04 NOTE — Telephone Encounter (Signed)
Spoke with patient regarding results/recommendations.  

## 2021-10-13 ENCOUNTER — Telehealth: Payer: Self-pay

## 2021-10-13 ENCOUNTER — Other Ambulatory Visit: Payer: Self-pay

## 2021-10-13 DIAGNOSIS — J449 Chronic obstructive pulmonary disease, unspecified: Secondary | ICD-10-CM | POA: Diagnosis not present

## 2021-10-13 MED ORDER — ALBUTEROL SULFATE (2.5 MG/3ML) 0.083% IN NEBU: INHALATION_SOLUTION | RESPIRATORY_TRACT | 0 refills | Status: DC

## 2021-10-13 NOTE — Telephone Encounter (Signed)
Patient refill request. Picking machine up in about an hour.  This is his only transportation today.  He apologized for the late request for refill.   [facility]  albuterol (PROVENTIL) (2.5 MG/3ML) 0.083% nebulizer solution [[id number]]

## 2021-10-13 NOTE — Telephone Encounter (Signed)
Rx sent to pharmacy. Pt must keep scheduled appt for further refills
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
## 2021-10-18 ENCOUNTER — Encounter: Payer: Self-pay | Admitting: Family Medicine

## 2021-10-18 ENCOUNTER — Other Ambulatory Visit: Payer: Self-pay

## 2021-10-18 ENCOUNTER — Ambulatory Visit (INDEPENDENT_AMBULATORY_CARE_PROVIDER_SITE_OTHER): Payer: Medicare Other | Admitting: Family Medicine

## 2021-10-18 VITALS — BP 143/94 | HR 89 | Temp 98.2°F | Ht 69.0 in | Wt 200.0 lb

## 2021-10-18 DIAGNOSIS — M159 Polyosteoarthritis, unspecified: Secondary | ICD-10-CM | POA: Diagnosis not present

## 2021-10-18 DIAGNOSIS — E538 Deficiency of other specified B group vitamins: Secondary | ICD-10-CM

## 2021-10-18 DIAGNOSIS — J449 Chronic obstructive pulmonary disease, unspecified: Secondary | ICD-10-CM

## 2021-10-18 DIAGNOSIS — F5104 Psychophysiologic insomnia: Secondary | ICD-10-CM | POA: Diagnosis not present

## 2021-10-18 DIAGNOSIS — I10 Essential (primary) hypertension: Secondary | ICD-10-CM | POA: Diagnosis not present

## 2021-10-18 DIAGNOSIS — F32A Depression, unspecified: Secondary | ICD-10-CM | POA: Diagnosis not present

## 2021-10-18 DIAGNOSIS — F419 Anxiety disorder, unspecified: Secondary | ICD-10-CM | POA: Diagnosis not present

## 2021-10-18 DIAGNOSIS — E559 Vitamin D deficiency, unspecified: Secondary | ICD-10-CM

## 2021-10-18 LAB — BASIC METABOLIC PANEL
BUN: 13 mg/dL (ref 6–23)
CO2: 26 mEq/L (ref 19–32)
Calcium: 9.1 mg/dL (ref 8.4–10.5)
Chloride: 99 mEq/L (ref 96–112)
Creatinine, Ser: 0.93 mg/dL (ref 0.40–1.50)
GFR: 83.97 mL/min (ref >60.00)
Glucose, Bld: 74 mg/dL (ref 70–99)
Potassium: 4.2 mEq/L (ref 3.5–5.1)
Sodium: 136 mEq/L (ref 135–145)

## 2021-10-18 LAB — VITAMIN D 25 HYDROXY (VIT D DEFICIENCY, FRACTURES): VITD: 23.37 ng/mL — ABNORMAL LOW (ref 30.00–100.00)

## 2021-10-18 LAB — VITAMIN B12: Vitamin B-12: 233 pg/mL (ref 211–911)

## 2021-10-18 MED ORDER — FUROSEMIDE 20 MG PO TABS: ORAL_TABLET | ORAL | 6 refills | Status: DC

## 2021-10-18 MED ORDER — CYANOCOBALAMIN 1000 MCG/ML IJ SOLN: 1000.0000 ug | INTRAMUSCULAR | 1 refills | Status: AC

## 2021-10-18 MED ORDER — QUETIAPINE FUMARATE 400 MG PO TABS: ORAL_TABLET | ORAL | 1 refills | Status: DC

## 2021-10-18 MED ORDER — ALBUTEROL SULFATE (2.5 MG/3ML) 0.083% IN NEBU: INHALATION_SOLUTION | RESPIRATORY_TRACT | 3 refills | Status: DC

## 2021-10-18 MED ORDER — BEVESPI AEROSPHERE 9-4.8 MCG/ACT IN AERO: INHALATION_SPRAY | RESPIRATORY_TRACT | 3 refills | Status: DC

## 2021-10-18 MED ORDER — CYANOCOBALAMIN 1000 MCG/ML IJ SOLN
1000.0000 ug | Freq: Once | INTRAMUSCULAR | Status: AC
  Administered 2021-10-18: 1000 ug via INTRAMUSCULAR

## 2021-10-18 MED ORDER — IBUPROFEN 800 MG PO TABS: ORAL_TABLET | ORAL | 3 refills | Status: DC

## 2021-10-18 MED ORDER — ALBUTEROL SULFATE HFA 108 (90 BASE) MCG/ACT IN AERS: INHALATION_SPRAY | RESPIRATORY_TRACT | 2 refills | Status: DC

## 2021-10-18 MED ORDER — BUPROPION HCL ER (XL) 300 MG PO TB24: ORAL_TABLET | ORAL | 3 refills | Status: AC

## 2021-10-18 MED ORDER — FOLIC ACID 1 MG PO TABS: 1.0000 mg | ORAL_TABLET | Freq: Every day | ORAL | 3 refills | Status: DC
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
## 2021-10-18 NOTE — Addendum Note (Signed)
Addended by: Deveron Furlong D on: 10/18/2021 02:39 PM   Modules accepted: Orders

## 2021-10-19 ENCOUNTER — Telehealth: Payer: Self-pay

## 2021-10-19 MED ORDER — "SYRINGE/NEEDLE (DISP) 25G X 5/8"" 3 ML MISC": 1 refills | Status: DC

## 2021-10-19 NOTE — Telephone Encounter (Signed)
-----   Message from [name], MD sent at 10/18/2021  5:54 PM EST ----- Vitamin B12 level borderline low, as expected. Start home B12 injections as planned. Vitamin D significantly low.  He needs to start taking 50,000 unit vitamin D tabs, 1 tab once a week.  Prescribed #12, refill x3. All other labs normal.
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
## 2021-11-08 ENCOUNTER — Other Ambulatory Visit: Payer: Self-pay | Admitting: Family Medicine

## 2021-11-13 DIAGNOSIS — J449 Chronic obstructive pulmonary disease, unspecified: Secondary | ICD-10-CM | POA: Diagnosis not present
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
## 2021-11-24 ENCOUNTER — Other Ambulatory Visit: Payer: Self-pay

## 2021-11-27 ENCOUNTER — Other Ambulatory Visit: Payer: Self-pay

## 2021-11-27 ENCOUNTER — Ambulatory Visit (INDEPENDENT_AMBULATORY_CARE_PROVIDER_SITE_OTHER): Payer: Medicare Other | Admitting: Family Medicine

## 2021-11-27 ENCOUNTER — Encounter: Payer: Self-pay | Admitting: Family Medicine

## 2021-11-27 VITALS — BP 115/75 | HR 99 | Temp 98.2°F | Ht 69.0 in | Wt 200.4 lb

## 2021-11-27 DIAGNOSIS — E538 Deficiency of other specified B group vitamins: Secondary | ICD-10-CM

## 2021-11-27 DIAGNOSIS — R0602 Shortness of breath: Secondary | ICD-10-CM

## 2021-11-27 DIAGNOSIS — R600 Localized edema: Secondary | ICD-10-CM | POA: Diagnosis not present

## 2021-11-27 MED ORDER — CYANOCOBALAMIN 1000 MCG/ML IJ SOLN
1000.0000 ug | Freq: Once | INTRAMUSCULAR | Status: AC
  Administered 2021-11-27: 1000 ug via INTRAMUSCULAR

## 2021-11-27 MED ORDER — FUROSEMIDE 20 MG PO TABS: ORAL_TABLET | ORAL | 1 refills | Status: DC
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
## 2021-11-28 ENCOUNTER — Telehealth: Payer: Self-pay

## 2021-11-28 LAB — BASIC METABOLIC PANEL
BUN: 8 mg/dL (ref 6–23)
CO2: 29 mEq/L (ref 19–32)
Calcium: 8.9 mg/dL (ref 8.4–10.5)
Chloride: 99 mEq/L (ref 96–112)
Creatinine, Ser: 0.94 mg/dL (ref 0.40–1.50)
GFR: 82.83 mL/min (ref >60.00)
Glucose, Bld: 96 mg/dL (ref 70–99)
Potassium: 3.2 mEq/L — ABNORMAL LOW (ref 3.5–5.1)
Sodium: 141 mEq/L (ref 135–145)

## 2021-11-28 LAB — BRAIN NATRIURETIC PEPTIDE: Pro B Natriuretic peptide (BNP): 52 pg/mL (ref 0.0–100.0)

## 2021-11-28 NOTE — Telephone Encounter (Signed)
Please review and advise.

## 2021-11-28 NOTE — Telephone Encounter (Signed)
Patient is requesting home health aide to help patient with household duties, and to assist him, when needed.  Patient was seen yesterday by Dr. Anitra Lauth.  His next appt is scheduled for 12/04/21.  Please advise.

## 2021-11-29 ENCOUNTER — Telehealth: Payer: Self-pay

## 2021-11-29 MED ORDER — POTASSIUM CHLORIDE CRYS ER 20 MEQ PO TBCR: 20.0000 meq | EXTENDED_RELEASE_TABLET | Freq: Two times a day (BID) | ORAL | 1 refills | Status: DC

## 2021-11-29 NOTE — Telephone Encounter (Signed)
-----   Message from [name], MD sent at 11/29/2021  9:00 AM EST ----- ?Potassium is a little low. ?I recommend potassium supplement: Please prescribe KDUR 20 mEq, 2 tabs p.o. daily, #60, refill x1. ?

## 2021-12-04 ENCOUNTER — Ambulatory Visit: Payer: Medicare Other | Admitting: Family Medicine

## 2021-12-05 ENCOUNTER — Other Ambulatory Visit: Payer: Self-pay

## 2021-12-06 ENCOUNTER — Telehealth: Payer: Self-pay

## 2021-12-06 ENCOUNTER — Ambulatory Visit: Payer: Medicare Other | Admitting: Family Medicine

## 2021-12-06 NOTE — Telephone Encounter (Signed)
FYI  Please see below

## 2021-12-06 NOTE — Telephone Encounter (Signed)
No Show/Cancel within 24 hours ? ?pt called at 8:05am - stomach issues w/ diarrhea / unable to do VV - rescheduled appt for 3/15 ? ? ?

## 2021-12-11 DIAGNOSIS — J449 Chronic obstructive pulmonary disease, unspecified: Secondary | ICD-10-CM | POA: Diagnosis not present

## 2021-12-12 ENCOUNTER — Telehealth: Payer: Self-pay

## 2021-12-12 NOTE — Telephone Encounter (Signed)
LVM for pt to CB and schedule Medicare Annual Wellness Visit (AWV).   ?

## 2021-12-12 NOTE — Telephone Encounter (Signed)
Fee waived due to illness ?

## 2021-12-13 ENCOUNTER — Ambulatory Visit: Payer: Medicare Other | Admitting: Family Medicine

## 2021-12-13 ENCOUNTER — Telehealth: Payer: Self-pay | Admitting: Family Medicine

## 2021-12-14 ENCOUNTER — Encounter: Payer: Self-pay | Admitting: Family Medicine

## 2021-12-14 ENCOUNTER — Ambulatory Visit (INDEPENDENT_AMBULATORY_CARE_PROVIDER_SITE_OTHER): Payer: Medicare Other | Admitting: Family Medicine

## 2021-12-14 ENCOUNTER — Other Ambulatory Visit: Payer: Self-pay

## 2021-12-14 VITALS — BP 143/91 | HR 101 | Temp 98.0°F | Ht 69.0 in | Wt 193.6 lb

## 2021-12-14 DIAGNOSIS — J449 Chronic obstructive pulmonary disease, unspecified: Secondary | ICD-10-CM | POA: Diagnosis not present

## 2021-12-14 DIAGNOSIS — S93401A Sprain of unspecified ligament of right ankle, initial encounter: Secondary | ICD-10-CM | POA: Diagnosis not present

## 2021-12-14 DIAGNOSIS — F10129 Alcohol abuse with intoxication, unspecified: Secondary | ICD-10-CM | POA: Diagnosis not present

## 2021-12-14 DIAGNOSIS — R600 Localized edema: Secondary | ICD-10-CM

## 2021-12-14 DIAGNOSIS — E876 Hypokalemia: Secondary | ICD-10-CM | POA: Diagnosis not present

## 2021-12-14 DIAGNOSIS — E538 Deficiency of other specified B group vitamins: Secondary | ICD-10-CM

## 2021-12-14 LAB — COMPREHENSIVE METABOLIC PANEL
ALT: 11 U/L (ref 0–53)
AST: 17 U/L (ref 0–37)
Albumin: 4.6 g/dL (ref 3.5–5.2)
Alkaline Phosphatase: 56 U/L (ref 39–117)
BUN: 7 mg/dL (ref 6–23)
CO2: 21 mEq/L (ref 19–32)
Calcium: 9 mg/dL (ref 8.4–10.5)
Chloride: 86 mEq/L — ABNORMAL LOW (ref 96–112)
Creatinine, Ser: 1.02 mg/dL (ref 0.40–1.50)
GFR: 75.07 mL/min (ref >60.00)
Glucose, Bld: 63 mg/dL — ABNORMAL LOW (ref 70–99)
Potassium: 4.8 mEq/L (ref 3.5–5.1)
Sodium: 124 mEq/L — ABNORMAL LOW (ref 135–145)
Total Bilirubin: 0.8 mg/dL (ref 0.2–1.2)
Total Protein: 7.3 g/dL (ref 6.0–8.3)

## 2021-12-14 MED ORDER — CYANOCOBALAMIN 1000 MCG/ML IJ SOLN
1000.0000 ug | Freq: Once | INTRAMUSCULAR | Status: AC
  Administered 2021-12-14: 1000 ug via INTRAMUSCULAR
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
## 2021-12-15 ENCOUNTER — Ambulatory Visit (INDEPENDENT_AMBULATORY_CARE_PROVIDER_SITE_OTHER): Payer: Medicare Other

## 2021-12-15 DIAGNOSIS — Z0000 Encounter for general adult medical examination without abnormal findings: Secondary | ICD-10-CM | POA: Diagnosis not present

## 2021-12-15 DIAGNOSIS — Z1211 Encounter for screening for malignant neoplasm of colon: Secondary | ICD-10-CM | POA: Diagnosis not present
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
## 2021-12-18 ENCOUNTER — Telehealth: Payer: Self-pay | Admitting: Family Medicine

## 2021-12-24 LAB — COLOGUARD

## 2021-12-25 NOTE — Telephone Encounter (Signed)
Pt called stating we have his RX written incorrectly. Pt states he takes 2/morning and 2/evening of QUEtiapine (SEROQUEL) 400 MG tablet ? ?He did use some swearing stating that we needed to get it right and get it to the pharmacy.  ? ?[place], [place]  ?[address], [place]  [address]  ?Phone:  [phone]  Fax:  [phone]  ?

## 2021-12-26 NOTE — Telephone Encounter (Signed)
Pt was last seen 12/16/21, last rx written as such: ? ?Dispense Quantity: 180 tablet Refills: 1   ?     ?Sig: TAKE 1 TABLET BY MOUTH TWICE DAILY.  ?     ?Start Date: 10/18/21 End Date: --  ?Written Date: 10/18/21    ? ?Please confirm if change needed to rx ?

## 2021-12-27 NOTE — Telephone Encounter (Signed)
He should only be taking 1 tab twice a day. ?The prescription has the correct instructions. ?

## 2021-12-27 NOTE — Telephone Encounter (Signed)
LM for pt to return call regarding medication 

## 2021-12-28 ENCOUNTER — Telehealth: Payer: Self-pay

## 2021-12-28 NOTE — Telephone Encounter (Signed)
Patient refill request.  Patrick ? ?QUEtiapine (SEROQUEL) 400 MG tablet [[id number]]  ? ? ? ? ?

## 2021-12-28 NOTE — Telephone Encounter (Signed)
Pt advised of rx instructions. His last refill was delivered today ?

## 2021-12-28 NOTE — Telephone Encounter (Signed)
See phone note from 3/20 ?

## 2021-12-30 HISTORY — PX: OTHER SURGICAL HISTORY: SHX169

## 2022-01-08 NOTE — Telephone Encounter (Signed)
error 

## 2022-01-11 ENCOUNTER — Encounter: Payer: Self-pay | Admitting: Family Medicine

## 2022-01-11 DIAGNOSIS — J449 Chronic obstructive pulmonary disease, unspecified: Secondary | ICD-10-CM | POA: Diagnosis not present

## 2022-01-19 ENCOUNTER — Encounter: Payer: Self-pay | Admitting: Family Medicine

## 2022-01-19 ENCOUNTER — Other Ambulatory Visit: Payer: Self-pay

## 2022-01-19 ENCOUNTER — Ambulatory Visit (INDEPENDENT_AMBULATORY_CARE_PROVIDER_SITE_OTHER): Payer: Medicare Other | Admitting: Family Medicine

## 2022-01-19 ENCOUNTER — Emergency Department (HOSPITAL_COMMUNITY): Payer: Medicare Other

## 2022-01-19 ENCOUNTER — Inpatient Hospital Stay (HOSPITAL_COMMUNITY)
Admission: EM | Admit: 2022-01-19 | Discharge: 2022-01-26 | DRG: 641 | Disposition: A | Discharge status: Home Health | Payer: Medicare Other | Source: Ambulatory Visit | Attending: Internal Medicine | Admitting: Internal Medicine

## 2022-01-19 ENCOUNTER — Encounter (HOSPITAL_COMMUNITY): Payer: Self-pay | Admitting: *Deleted

## 2022-01-19 ENCOUNTER — Observation Stay (HOSPITAL_COMMUNITY): Payer: Medicare Other

## 2022-01-19 VITALS — BP 107/78 | HR 110 | Temp 98.2°F | Ht 69.0 in

## 2022-01-19 DIAGNOSIS — G909 Disorder of the autonomic nervous system, unspecified: Secondary | ICD-10-CM | POA: Diagnosis not present

## 2022-01-19 DIAGNOSIS — N138 Other obstructive and reflux uropathy: Secondary | ICD-10-CM | POA: Diagnosis present

## 2022-01-19 DIAGNOSIS — I480 Paroxysmal atrial fibrillation: Secondary | ICD-10-CM | POA: Diagnosis present

## 2022-01-19 DIAGNOSIS — Z808 Family history of malignant neoplasm of other organs or systems: Secondary | ICD-10-CM

## 2022-01-19 DIAGNOSIS — Z8261 Family history of arthritis: Secondary | ICD-10-CM

## 2022-01-19 DIAGNOSIS — I10 Essential (primary) hypertension: Secondary | ICD-10-CM | POA: Diagnosis not present

## 2022-01-19 DIAGNOSIS — R627 Adult failure to thrive: Secondary | ICD-10-CM | POA: Diagnosis present

## 2022-01-19 DIAGNOSIS — F331 Major depressive disorder, recurrent, moderate: Secondary | ICD-10-CM

## 2022-01-19 DIAGNOSIS — I9589 Other hypotension: Secondary | ICD-10-CM

## 2022-01-19 DIAGNOSIS — Z6826 Body mass index (BMI) 26.0-26.9, adult: Secondary | ICD-10-CM

## 2022-01-19 DIAGNOSIS — Z7982 Long term (current) use of aspirin: Secondary | ICD-10-CM

## 2022-01-19 DIAGNOSIS — M159 Polyosteoarthritis, unspecified: Secondary | ICD-10-CM | POA: Diagnosis not present

## 2022-01-19 DIAGNOSIS — I951 Orthostatic hypotension: Secondary | ICD-10-CM | POA: Diagnosis present

## 2022-01-19 DIAGNOSIS — E871 Hypo-osmolality and hyponatremia: Principal | ICD-10-CM | POA: Diagnosis present

## 2022-01-19 DIAGNOSIS — R6251 Failure to thrive (child): Secondary | ICD-10-CM

## 2022-01-19 DIAGNOSIS — W19XXXA Unspecified fall, initial encounter: Principal | ICD-10-CM

## 2022-01-19 DIAGNOSIS — K7460 Unspecified cirrhosis of liver: Secondary | ICD-10-CM | POA: Diagnosis not present

## 2022-01-19 DIAGNOSIS — F1010 Alcohol abuse, uncomplicated: Secondary | ICD-10-CM | POA: Diagnosis not present

## 2022-01-19 DIAGNOSIS — Z888 Allergy status to other drugs, medicaments and biological substances status: Secondary | ICD-10-CM | POA: Diagnosis not present

## 2022-01-19 DIAGNOSIS — J449 Chronic obstructive pulmonary disease, unspecified: Secondary | ICD-10-CM | POA: Diagnosis present

## 2022-01-19 DIAGNOSIS — E878 Other disorders of electrolyte and fluid balance, not elsewhere classified: Secondary | ICD-10-CM | POA: Diagnosis not present

## 2022-01-19 DIAGNOSIS — I6330 Cerebral infarction due to thrombosis of unspecified cerebral artery: Secondary | ICD-10-CM | POA: Insufficient documentation

## 2022-01-19 DIAGNOSIS — R270 Ataxia, unspecified: Secondary | ICD-10-CM

## 2022-01-19 DIAGNOSIS — F17210 Nicotine dependence, cigarettes, uncomplicated: Secondary | ICD-10-CM | POA: Diagnosis not present

## 2022-01-19 DIAGNOSIS — I6389 Other cerebral infarction: Secondary | ICD-10-CM | POA: Diagnosis not present

## 2022-01-19 DIAGNOSIS — I482 Chronic atrial fibrillation, unspecified: Secondary | ICD-10-CM | POA: Diagnosis present

## 2022-01-19 DIAGNOSIS — R531 Weakness: Secondary | ICD-10-CM | POA: Diagnosis not present

## 2022-01-19 DIAGNOSIS — S0292XA Unspecified fracture of facial bones, initial encounter for closed fracture: Secondary | ICD-10-CM | POA: Diagnosis not present

## 2022-01-19 DIAGNOSIS — R5383 Other fatigue: Secondary | ICD-10-CM | POA: Diagnosis not present

## 2022-01-19 DIAGNOSIS — E538 Deficiency of other specified B group vitamins: Secondary | ICD-10-CM | POA: Diagnosis present

## 2022-01-19 DIAGNOSIS — Z79899 Other long term (current) drug therapy: Secondary | ICD-10-CM

## 2022-01-19 DIAGNOSIS — R000 Tachycardia, unspecified: Secondary | ICD-10-CM

## 2022-01-19 DIAGNOSIS — R296 Repeated falls: Secondary | ICD-10-CM | POA: Diagnosis present

## 2022-01-19 DIAGNOSIS — D649 Anemia, unspecified: Secondary | ICD-10-CM | POA: Diagnosis not present

## 2022-01-19 DIAGNOSIS — G319 Degenerative disease of nervous system, unspecified: Secondary | ICD-10-CM | POA: Diagnosis not present

## 2022-01-19 DIAGNOSIS — H905 Unspecified sensorineural hearing loss: Secondary | ICD-10-CM | POA: Diagnosis not present

## 2022-01-19 DIAGNOSIS — R42 Dizziness and giddiness: Secondary | ICD-10-CM | POA: Diagnosis not present

## 2022-01-19 DIAGNOSIS — E861 Hypovolemia: Secondary | ICD-10-CM | POA: Diagnosis not present

## 2022-01-19 DIAGNOSIS — F32A Depression, unspecified: Secondary | ICD-10-CM | POA: Diagnosis not present

## 2022-01-19 DIAGNOSIS — E440 Moderate protein-calorie malnutrition: Secondary | ICD-10-CM | POA: Diagnosis not present

## 2022-01-19 DIAGNOSIS — F419 Anxiety disorder, unspecified: Secondary | ICD-10-CM | POA: Diagnosis not present

## 2022-01-19 DIAGNOSIS — F411 Generalized anxiety disorder: Secondary | ICD-10-CM | POA: Diagnosis not present

## 2022-01-19 DIAGNOSIS — S0240FA Zygomatic fracture, left side, initial encounter for closed fracture: Secondary | ICD-10-CM | POA: Diagnosis not present

## 2022-01-19 DIAGNOSIS — E785 Hyperlipidemia, unspecified: Secondary | ICD-10-CM | POA: Diagnosis present

## 2022-01-19 DIAGNOSIS — N401 Enlarged prostate with lower urinary tract symptoms: Secondary | ICD-10-CM | POA: Diagnosis not present

## 2022-01-19 DIAGNOSIS — J3489 Other specified disorders of nose and nasal sinuses: Secondary | ICD-10-CM | POA: Diagnosis not present

## 2022-01-19 DIAGNOSIS — I639 Cerebral infarction, unspecified: Secondary | ICD-10-CM | POA: Diagnosis not present

## 2022-01-19 LAB — CBC WITH DIFFERENTIAL/PLATELET
Abs Immature Granulocytes: 0.06 10*3/uL (ref 0.00–0.07)
Basophils Absolute: 0 10*3/uL (ref 0.0–0.1)
Basophils Relative: 1 %
Eosinophils Absolute: 0 10*3/uL (ref 0.0–0.5)
Eosinophils Relative: 1 %
HCT: 38 % — ABNORMAL LOW (ref 39.0–52.0)
Hemoglobin: 13.1 g/dL (ref 13.0–17.0)
Immature Granulocytes: 1 %
Lymphocytes Relative: 21 %
Lymphs Abs: 1.2 10*3/uL (ref 0.7–4.0)
MCH: 32.2 pg (ref 26.0–34.0)
MCHC: 34.5 g/dL (ref 30.0–36.0)
MCV: 93.4 fL (ref 80.0–100.0)
Monocytes Absolute: 0.8 10*3/uL (ref 0.1–1.0)
Monocytes Relative: 13 %
Neutro Abs: 3.7 10*3/uL (ref 1.7–7.7)
Neutrophils Relative %: 63 %
Platelets: 191 10*3/uL (ref 150–400)
RBC: 4.07 MIL/uL — ABNORMAL LOW (ref 4.22–5.81)
RDW: 14.3 % (ref 11.5–15.5)
WBC: 5.8 10*3/uL (ref 4.0–10.5)
nRBC: 0 % (ref 0.0–0.2)

## 2022-01-19 LAB — HEPATIC FUNCTION PANEL
ALT: 19 U/L (ref 0–44)
AST: 19 U/L (ref 15–41)
Albumin: 3.6 g/dL (ref 3.5–5.0)
Alkaline Phosphatase: 37 U/L — ABNORMAL LOW (ref 38–126)
Bilirubin, Direct: 0.3 mg/dL — ABNORMAL HIGH (ref 0.0–0.2)
Indirect Bilirubin: 1.1 mg/dL — ABNORMAL HIGH (ref 0.3–0.9)
Total Bilirubin: 1.4 mg/dL — ABNORMAL HIGH (ref 0.3–1.2)
Total Protein: 6 g/dL — ABNORMAL LOW (ref 6.5–8.1)

## 2022-01-19 LAB — BASIC METABOLIC PANEL
Anion gap: 12 (ref 5–15)
Anion gap: 11 (ref 5–15)
BUN: 15 mg/dL (ref 8–23)
BUN: 15 mg/dL (ref 8–23)
CO2: 22 mmol/L (ref 22–32)
CO2: 22 mmol/L (ref 22–32)
Calcium: 8.9 mg/dL (ref 8.9–10.3)
Calcium: 8.5 mg/dL — ABNORMAL LOW (ref 8.9–10.3)
Chloride: 87 mmol/L — ABNORMAL LOW (ref 98–111)
Chloride: 90 mmol/L — ABNORMAL LOW (ref 98–111)
Creatinine, Ser: 1.04 mg/dL (ref 0.61–1.24)
Creatinine, Ser: 1.06 mg/dL (ref 0.61–1.24)
GFR, Estimated: >60 mL/min (ref >60)
GFR, Estimated: >60 mL/min (ref >60)
Glucose, Bld: 83 mg/dL (ref 70–99)
Glucose, Bld: 91 mg/dL (ref 70–99)
Potassium: 4.1 mmol/L (ref 3.5–5.1)
Potassium: 3.7 mmol/L (ref 3.5–5.1)
Sodium: 121 mmol/L — ABNORMAL LOW (ref 135–145)
Sodium: 123 mmol/L — ABNORMAL LOW (ref 135–145)

## 2022-01-19 LAB — RAPID URINE DRUG SCREEN, HOSP PERFORMED
Amphetamines: NOT DETECTED
Barbiturates: NOT DETECTED
Benzodiazepines: NOT DETECTED
Cocaine: NOT DETECTED
Opiates: NOT DETECTED
Tetrahydrocannabinol: NOT DETECTED

## 2022-01-19 LAB — MAGNESIUM: Magnesium: 2 mg/dL (ref 1.7–2.4)

## 2022-01-19 LAB — OSMOLALITY, URINE: Osmolality, Ur: 322 mOsm/kg (ref 300–900)

## 2022-01-19 LAB — HIV ANTIBODY (ROUTINE TESTING W REFLEX): HIV Screen 4th Generation wRfx: NONREACTIVE

## 2022-01-19 LAB — SODIUM, URINE, RANDOM: Sodium, Ur: <10 mmol/L

## 2022-01-19 LAB — VITAMIN B12: Vitamin B-12: 306 pg/mL (ref 180–914)

## 2022-01-19 LAB — OSMOLALITY: Osmolality: 254 mOsm/kg — ABNORMAL LOW (ref 275–295)

## 2022-01-19 LAB — PHOSPHORUS: Phosphorus: 3.8 mg/dL (ref 2.5–4.6)

## 2022-01-19 LAB — ETHANOL: Alcohol, Ethyl (B): <10 mg/dL (ref <10)

## 2022-01-19 IMAGING — CT CT HEAD W/O CM
4 series · 15 of 47 positions shown, 17 images · non-contrast
Comparison: Prior head CT [DATE] and earlier.

CLINICAL DATA: Provided history: Facial fracture, follow-up.



[Series 2: head wo · axial · 0.43mm/px · z∈[+1192,+1312]mm · 7 of 32 slices shown, 9 images]
[im 4/32  brain]
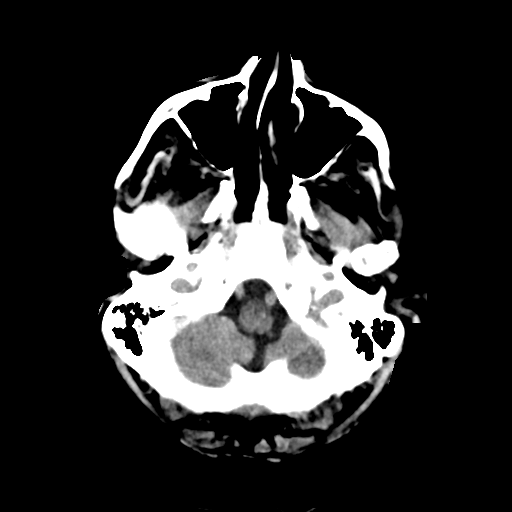
[im 4/32  bone]
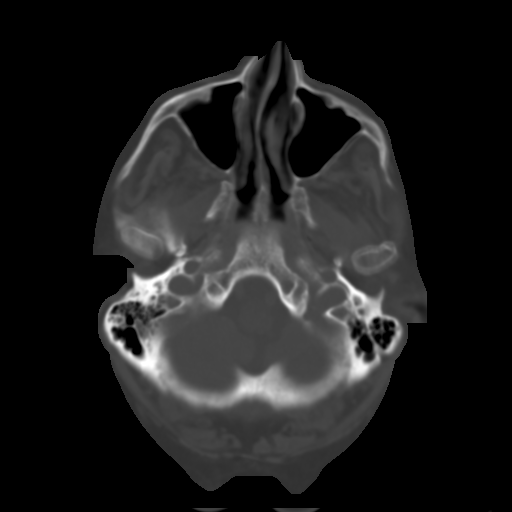
[im 8/32  brain]
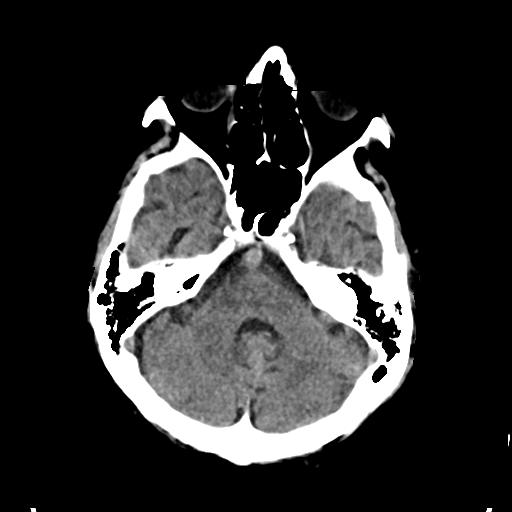
[im 12/32  brain]
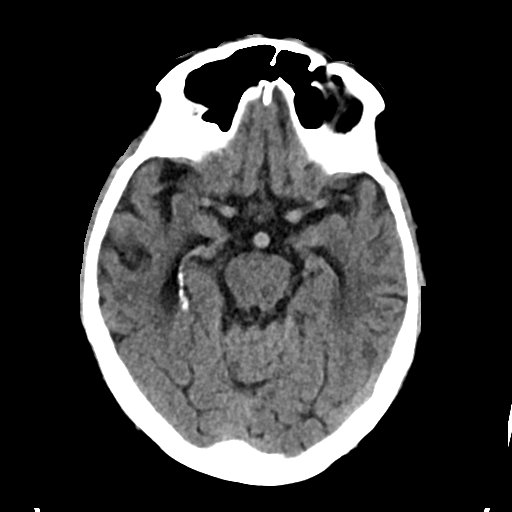
[im 16/32  brain]
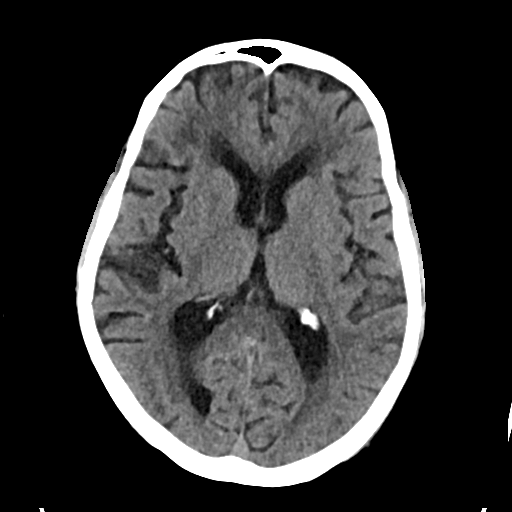
[im 20/32  brain]
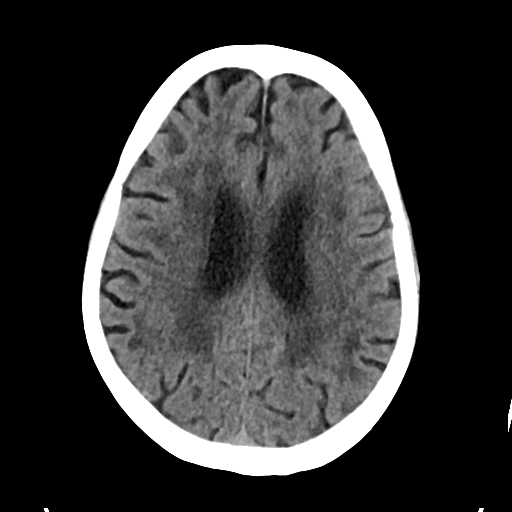
[im 20/32  bone]
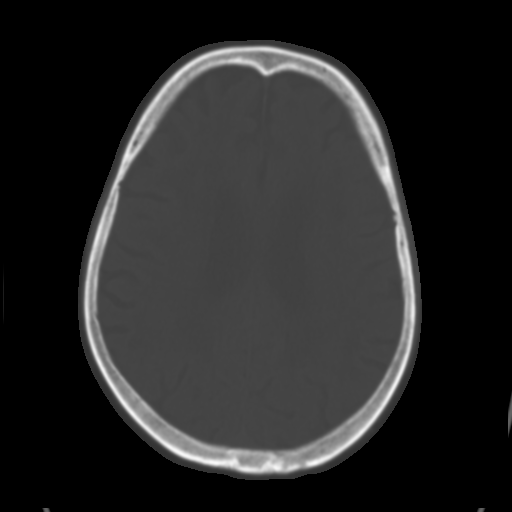
[im 24/32  brain]
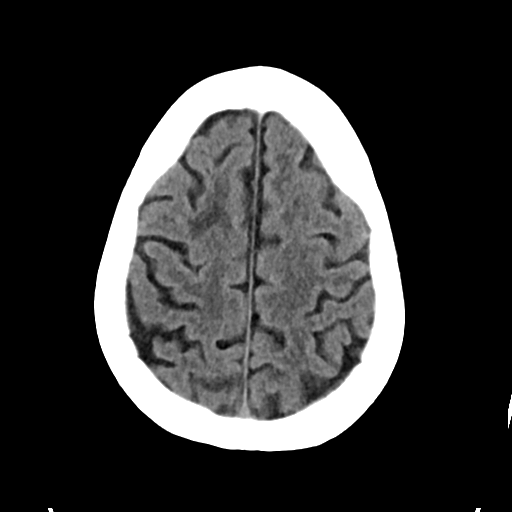
[im 28/32  brain]
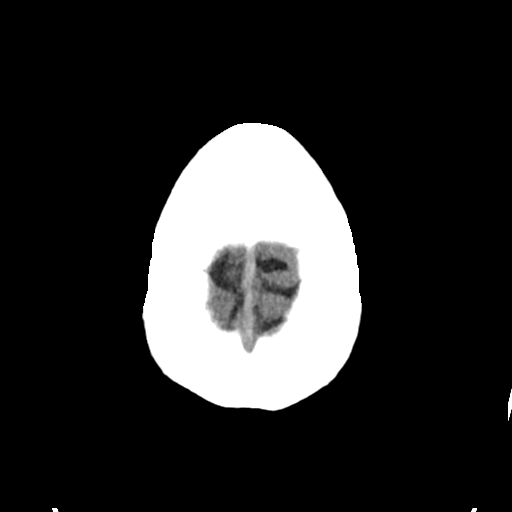

[Series 3: head bone · axial · 0.43mm/px · z∈[+1192,+1208]mm · 2 of 80 slices shown]
[im 8/80  bone]
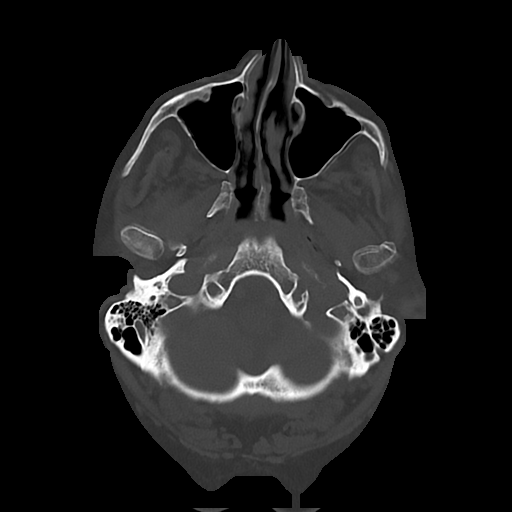
[im 16/80  bone]
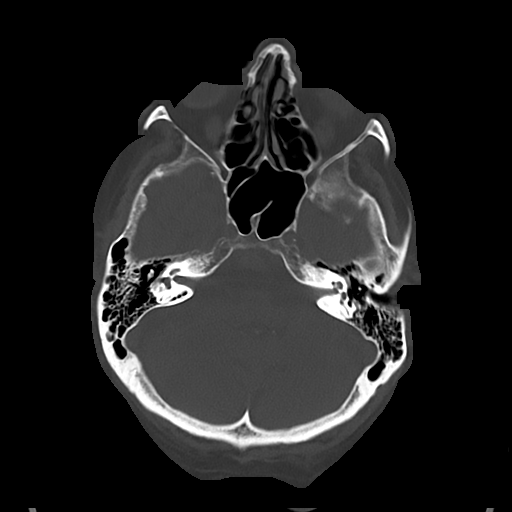

[Series 4: cor soft · coronal · 0.29mm/px · 3 of 74 slices shown]
[im 25/74  brain]
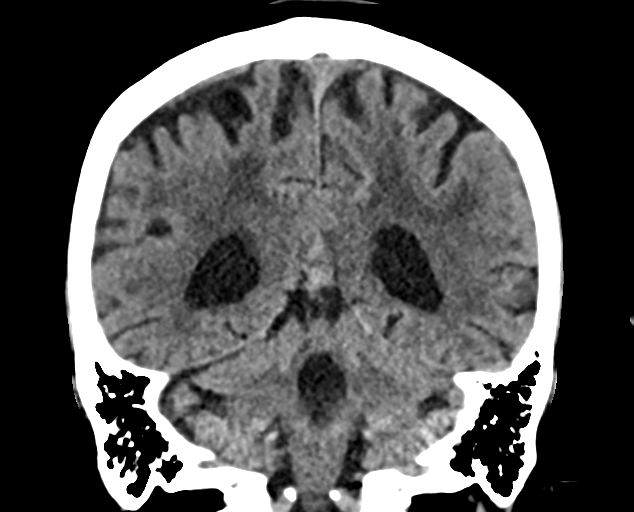
[im 33/74  brain]
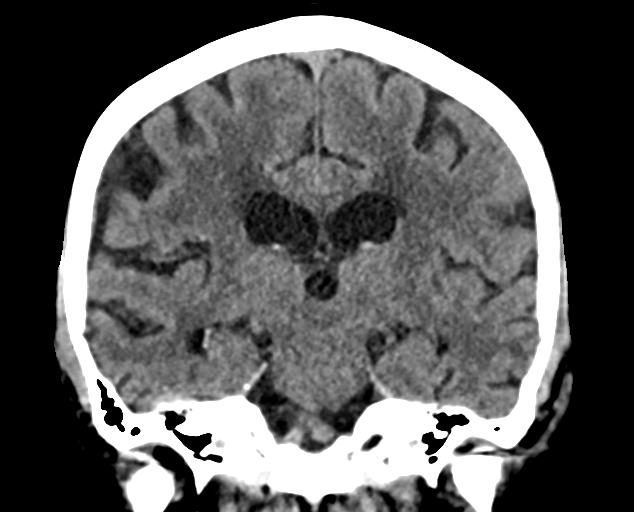
[im 41/74  brain]
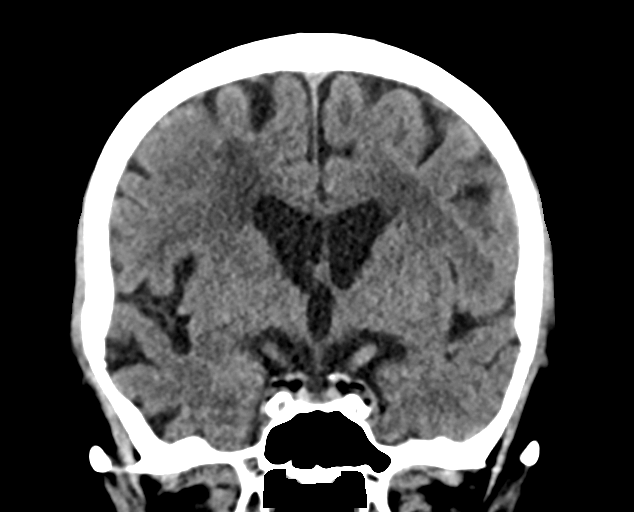

[Series 5: sag soft · sagittal · 0.29mm/px · 3 of 62 slices shown]
[im 21/62  brain]
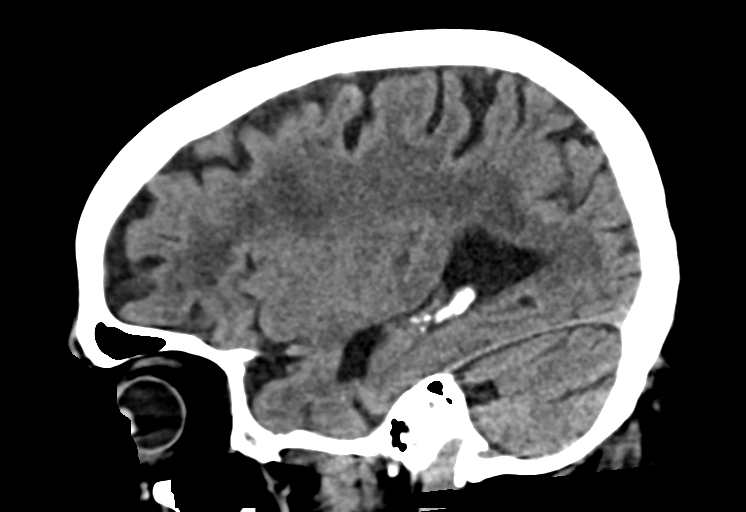
[im 31/62  brain]
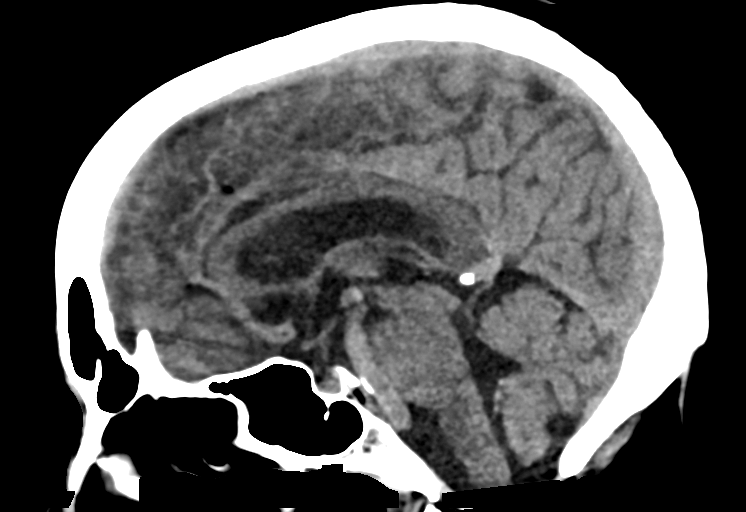
[im 41/62  brain]
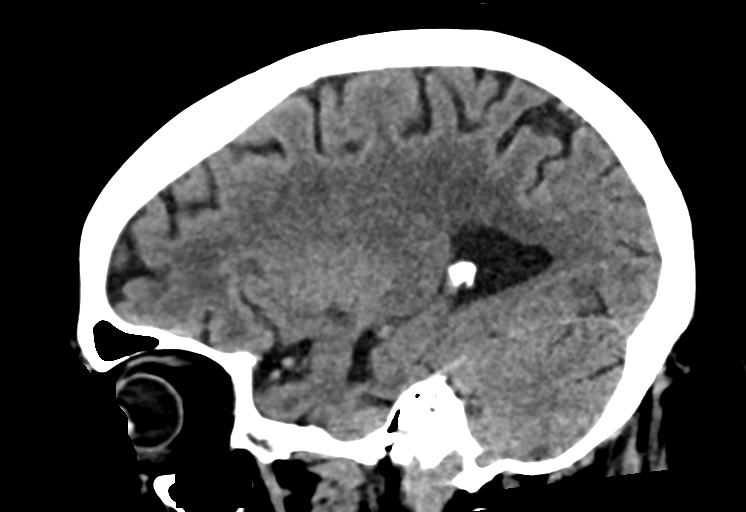

[15 of 47 positions shown; findings below may reference images not displayed]

FINDINGS: Mildly motion degraded exam.

Brain:

Mild generalized parenchymal atrophy.

Moderate to advanced patchy and confluent hypoattenuation within the
cerebral white matter, nonspecific but compatible with chronic small
vessel ischemic disease.

There is no acute intracranial hemorrhage.

No demarcated cortical infarct.

No extra-axial fluid collection.

No evidence of an intracranial mass.

No midline shift.

Vascular: Dolichoectasia a of the basilar artery. No hyperdense
vessel. Atherosclerotic calcifications.

Skull: Normal. Negative for fracture or focal lesion.

Sinuses/Orbits: Visualized orbits show no acute finding. Trace
scattered paranasal sinus mucosal thickening at the imaged levels.

Other: No evidence of acute maxillofacial fracture at the imaged
levels. Redemonstrated chronic fracture deformity of the left
zygomatic arch.
IMPRESSION: Mildly motion degraded exam.

No evidence of acute intracranial abnormality.

Moderate to advanced chronic small vessel image changes within the
cerebral white matter.

No evidence of acute maxillofacial fracture at the imaged levels.
However, if there is concern for an acute maxillofacial fracture, a
dedicated maxillofacial CT is recommended for further evaluation.

## 2022-01-19 IMAGING — MR MR HEAD WO/W CM
14 of 16 series · 41 of 48 positions shown · IV contrast (gadavist)
Comparison: None.

CLINICAL DATA: Ataxia

EXAM:
MRI HEAD WITHOUT AND WITH CONTRAST
TECHNIQUE: Multiplanar, multiecho pulse sequences of the brain and surrounding
structures were obtained without and with intravenous contrast.
CONTRAST:  10mL GADAVIST GADOBUTROL 1 MMOL/ML IV SOLN

[Series 5: DWI · axial · 3.0mm · 0.88mm/px · z∈[-141,+9]mm · 6 of 104 slices shown (1 of 4)]
[im 1/104]
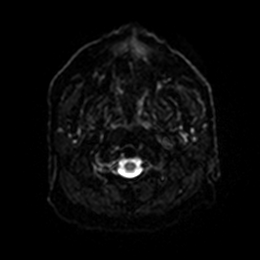
[im 21/104]
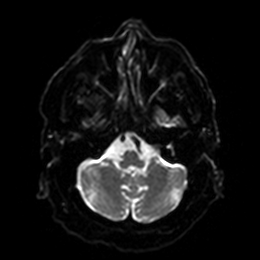
[im 42/104]
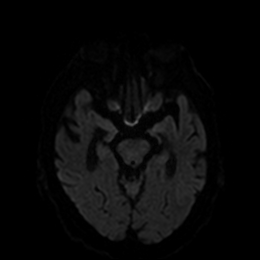
[im 62/104]
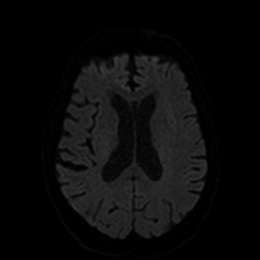
[im 83/104]
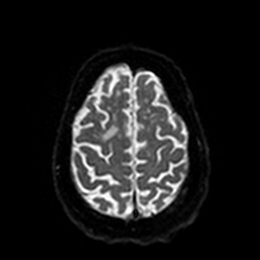
[im 104/104]
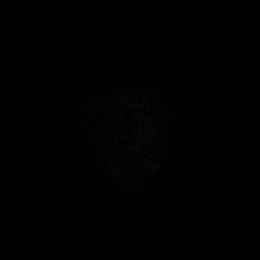

[Series 6: DWI · axial · 3.0mm · 0.88mm/px · z∈[-141,+9]mm · 3 of 52 slices shown (2 of 4)]
[im 1/52]
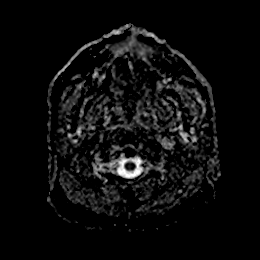
[im 26/52]
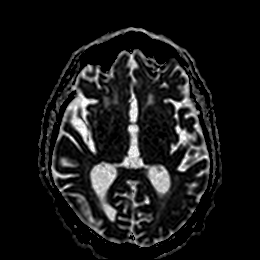
[im 52/52]
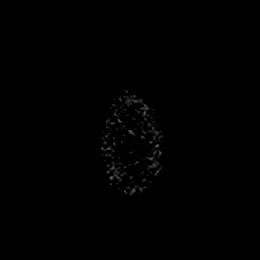

[Series 7: DWI · coronal · 4.0mm · 0.88mm/px · 4 of 72 slices shown (3 of 4)]
[im 1/72]
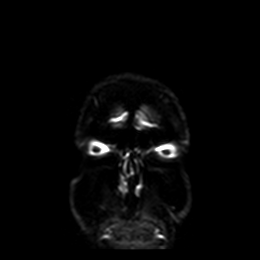
[im 24/72]
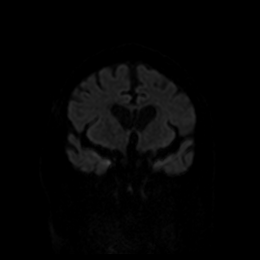
[im 48/72]
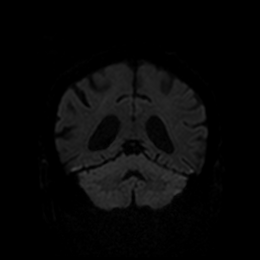
[im 72/72]
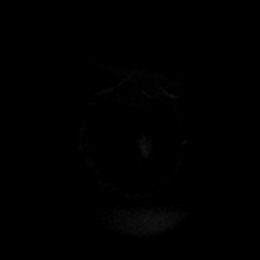

[Series 8: DWI · coronal · 4.0mm · 0.88mm/px · 2 of 36 slices shown (4 of 4)]
[im 1/36]
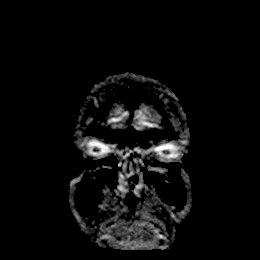
[im 36/36]
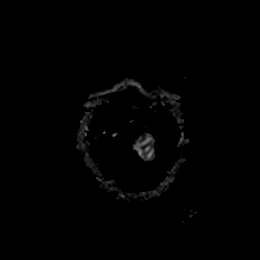

[Series 9: T1 · sagittal · 5.0mm · 0.75mm/px · 2 of 25 slices shown]
[im 1/25]
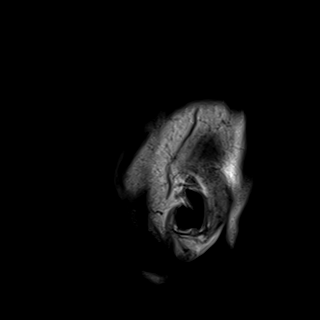
[im 25/25]
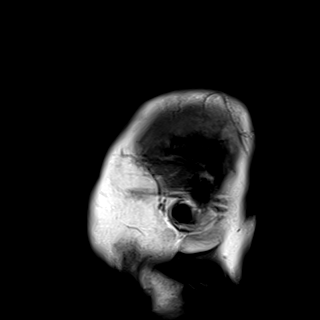

[Series 10: mag_images · axial · 3.0mm · 0.90mm/px · z∈[-153,+21]mm · 4 of 60 slices shown]
[im 1/60]
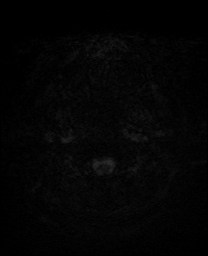
[im 20/60]
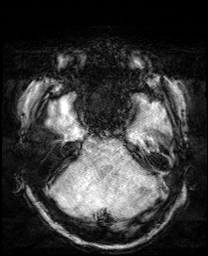
[im 40/60]
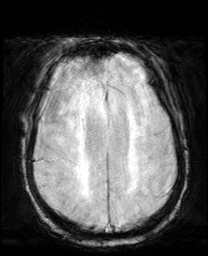
[im 60/60]
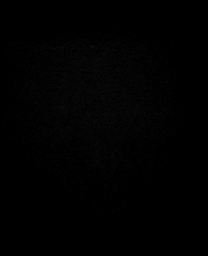

[Series 11: pha_images · axial · 3.0mm · 0.90mm/px · z∈[-153,+6]mm · 3 of 55 slices shown]
[im 1/55]
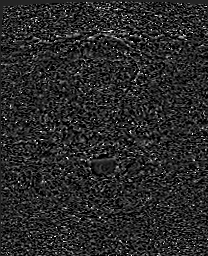
[im 28/55]
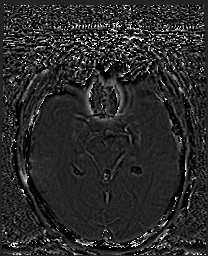
[im 55/55]
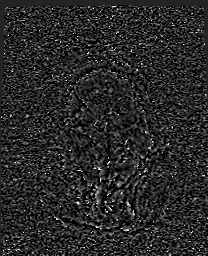

[Series 12: swi_images · axial · 3.0mm · 0.90mm/px · z∈[-153,+21]mm · 4 of 60 slices shown]
[im 1/60]
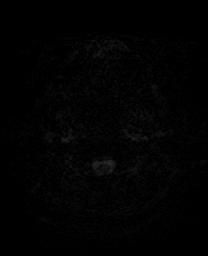
[im 20/60]
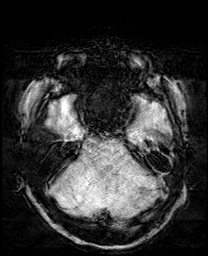
[im 40/60]
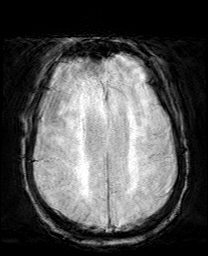
[im 60/60]
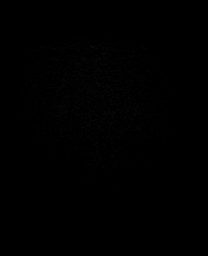

[Series 13: mip_images(sw) · axial · 24.0mm · 0.90mm/px · z∈[-143,+10]mm · 3 of 53 slices shown]
[im 1/53]
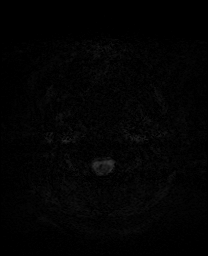
[im 27/53]
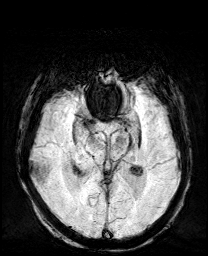
[im 53/53]
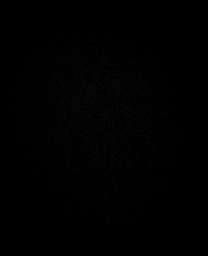

[Series 14: FLAIR · axial · 5.0mm · 0.45mm/px · z∈[-143,+10]mm · 2 of 27 slices shown]
[im 1/27]
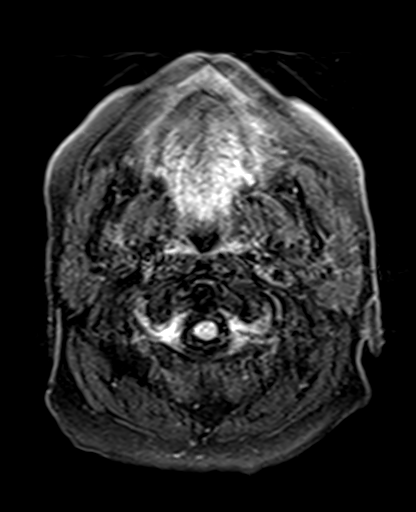
[im 27/27]
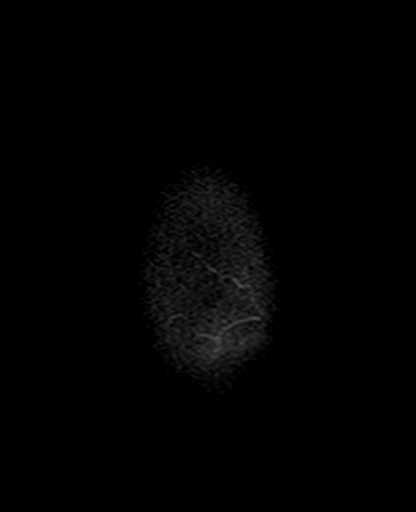

[Series 15: T2 · axial · 5.0mm · 0.72mm/px · z∈[-143,+11]mm · 2 of 27 slices shown]
[im 1/27]
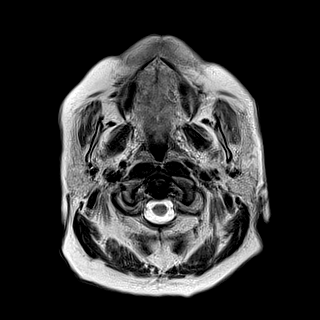
[im 27/27]
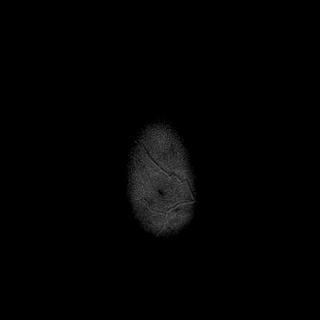

[Series 17: T2 post-contrast · coronal · 5.0mm · 0.72mm/px · 2 of 30 slices shown]
[im 1/30]
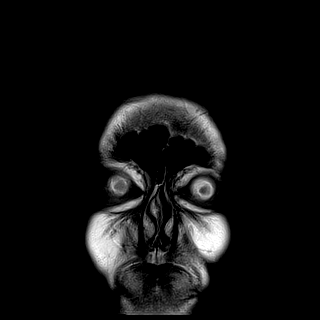
[im 30/30]
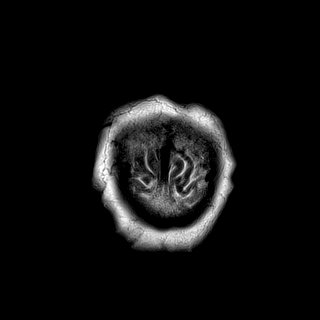

[Series 19: T1 post-contrast · coronal · 5.0mm · 0.34mm/px · 2 of 30 slices shown (1 of 2)]
[im 1/30]
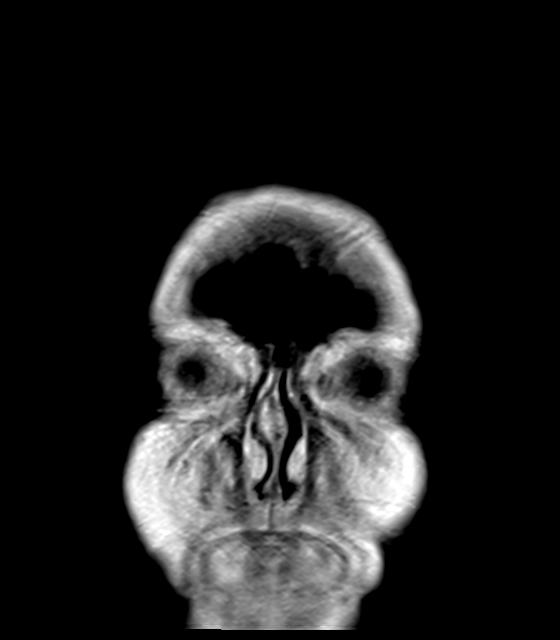
[im 30/30]
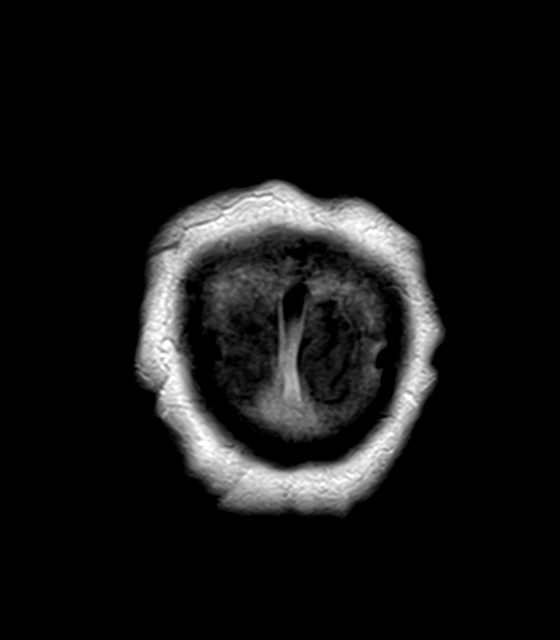

[Series 20: T1 post-contrast · sagittal · 5.0mm · 0.72mm/px · 2 of 25 slices shown (2 of 2)]
[im 1/25]
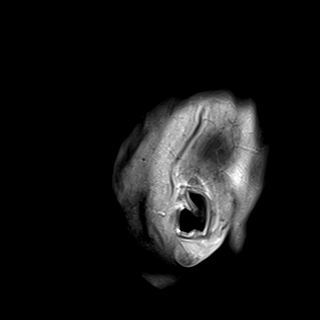
[im 25/25]
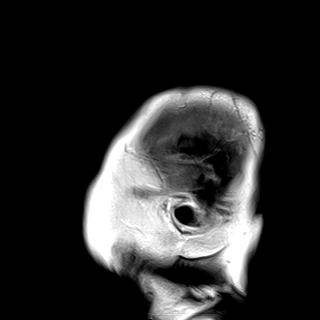

[41 of 48 positions shown; findings below may reference images not displayed]

FINDINGS: Brain: There is a small focus of subacute ischemia within the left
frontal white matter. No acute or chronic hemorrhage. There is
multifocal hyperintense T2-weighted signal within the white matter.
Generalized volume loss without a clear lobar predilection. The
midline structures are normal. There is no abnormal contrast
enhancement.

Vascular: Major flow voids are preserved.

Skull and upper cervical spine: Normal calvarium and skull base.
Visualized upper cervical spine and soft tissues are normal.

Sinuses/Orbits:No paranasal sinus fluid levels or advanced mucosal
thickening. No mastoid or middle ear effusion. Normal orbits.
IMPRESSION: 1. Small focus of subacute ischemia within the left frontal white
matter. No hemorrhage or mass effect.
2. Findings of chronic microvascular ischemia and generalized volume
loss.

## 2022-01-19 MED ORDER — ADULT MULTIVITAMIN W/MINERALS CH
1.0000 | ORAL_TABLET | Freq: Every day | ORAL | Status: DC
  Administered 2022-01-19 – 2022-01-26 (×8): 1 via ORAL
  Filled 2022-01-19 (×8): qty 1

## 2022-01-19 MED ORDER — ACETAMINOPHEN 325 MG PO TABS
650.0000 mg | ORAL_TABLET | Freq: Four times a day (QID) | ORAL | Status: DC | PRN
  Administered 2022-01-23 – 2022-01-24 (×4): 650 mg via ORAL
  Filled 2022-01-19 (×5): qty 2

## 2022-01-19 MED ORDER — ARFORMOTEROL TARTRATE 15 MCG/2ML IN NEBU
15.0000 ug | INHALATION_SOLUTION | Freq: Two times a day (BID) | RESPIRATORY_TRACT | Status: DC
  Administered 2022-01-20 – 2022-01-26 (×8): 15 ug via RESPIRATORY_TRACT
  Filled 2022-01-19 (×13): qty 2

## 2022-01-19 MED ORDER — ALBUTEROL SULFATE (2.5 MG/3ML) 0.083% IN NEBU
3.0000 mL | INHALATION_SOLUTION | RESPIRATORY_TRACT | Status: DC | PRN
  Administered 2022-01-21 – 2022-01-24 (×5): 3 mL via RESPIRATORY_TRACT
  Filled 2022-01-19 (×5): qty 3

## 2022-01-19 MED ORDER — PANTOPRAZOLE SODIUM 40 MG PO TBEC
40.0000 mg | DELAYED_RELEASE_TABLET | Freq: Every day | ORAL | Status: DC
  Administered 2022-01-20 – 2022-01-26 (×7): 40 mg via ORAL
  Filled 2022-01-19 (×7): qty 1

## 2022-01-19 MED ORDER — LORAZEPAM 1 MG PO TABS
1.0000 mg | ORAL_TABLET | ORAL | Status: AC | PRN
  Administered 2022-01-20 – 2022-01-21 (×2): 1 mg via ORAL
  Filled 2022-01-19 (×2): qty 1

## 2022-01-19 MED ORDER — ENOXAPARIN SODIUM 40 MG/0.4ML IJ SOSY
40.0000 mg | PREFILLED_SYRINGE | INTRAMUSCULAR | Status: DC
  Administered 2022-01-19 – 2022-01-25 (×7): 40 mg via SUBCUTANEOUS
  Filled 2022-01-19 (×7): qty 0.4

## 2022-01-19 MED ORDER — SODIUM CHLORIDE 0.9 % IV BOLUS
1000.0000 mL | Freq: Once | INTRAVENOUS | Status: AC
  Administered 2022-01-19: 1000 mL via INTRAVENOUS

## 2022-01-19 MED ORDER — LORAZEPAM 2 MG/ML IJ SOLN
1.0000 mg | INTRAMUSCULAR | Status: AC | PRN
  Administered 2022-01-20: 1 mg via INTRAVENOUS
  Filled 2022-01-19: qty 1

## 2022-01-19 MED ORDER — METOPROLOL SUCCINATE ER 50 MG PO TB24
100.0000 mg | ORAL_TABLET | Freq: Every day | ORAL | Status: DC
  Administered 2022-01-20 – 2022-01-21 (×2): 100 mg via ORAL
  Filled 2022-01-19: qty 4
  Filled 2022-01-19: qty 2

## 2022-01-19 MED ORDER — GADOBUTROL 1 MMOL/ML IV SOLN
10.0000 mL | Freq: Once | INTRAVENOUS | Status: AC | PRN
  Administered 2022-01-19: 10 mL via INTRAVENOUS

## 2022-01-19 MED ORDER — SODIUM CHLORIDE 0.9% FLUSH
3.0000 mL | Freq: Two times a day (BID) | INTRAVENOUS | Status: DC
  Administered 2022-01-19 – 2022-01-24 (×7): 3 mL via INTRAVENOUS

## 2022-01-19 MED ORDER — UMECLIDINIUM BROMIDE 62.5 MCG/ACT IN AEPB
1.0000 | INHALATION_SPRAY | Freq: Every day | RESPIRATORY_TRACT | Status: DC
  Administered 2022-01-22 – 2022-01-24 (×3): 1 via RESPIRATORY_TRACT
  Filled 2022-01-19 (×2): qty 7

## 2022-01-19 MED ORDER — FOLIC ACID 1 MG PO TABS
1.0000 mg | ORAL_TABLET | Freq: Every day | ORAL | Status: DC
  Administered 2022-01-19 – 2022-01-26 (×8): 1 mg via ORAL
  Filled 2022-01-19 (×8): qty 1

## 2022-01-19 MED ORDER — ACETAMINOPHEN 650 MG RE SUPP: 650.0000 mg | Freq: Four times a day (QID) | RECTAL | Status: DC | PRN

## 2022-01-19 MED ORDER — POLYETHYLENE GLYCOL 3350 17 G PO PACK: 17.0000 g | PACK | Freq: Every day | ORAL | Status: DC | PRN

## 2022-01-19 MED ORDER — THIAMINE HCL 100 MG/ML IJ SOLN
100.0000 mg | Freq: Every day | INTRAMUSCULAR | Status: DC
  Administered 2022-01-19 – 2022-01-21 (×3): 100 mg via INTRAVENOUS
  Filled 2022-01-19 (×3): qty 2

## 2022-01-19 MED ORDER — AMLODIPINE BESYLATE 5 MG PO TABS
10.0000 mg | ORAL_TABLET | Freq: Every day | ORAL | Status: DC
  Administered 2022-01-20: 10 mg via ORAL
  Filled 2022-01-19: qty 2

## 2022-01-19 NOTE — ED Notes (Signed)
Pt refusing to stand, states he has been having difficulty ambulating for the past two weeks ?
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
## 2022-01-19 NOTE — ED Notes (Signed)
PA requested for pt to ambulate. Pt refused stated "my knees are to weak, I cant do it. Ive already fallen twice today" PA aware  ?

## 2022-01-19 NOTE — Social Work (Signed)
CSW met patient at bed side for SUD consult. Cage Aid score was 3 the patient stated that he felt really down about his life and feeling like he did not have anything to look forward to. CSW counseled patient about activities in the community such as 12 step meetings Pt. Stated that he is willing to attend meetings CSW added resources to the patient AVS. CSW counseled patient to attend out patient therapy.Patient was very pleasant and motivated and seemed to really want the help.  ?

## 2022-01-19 NOTE — ED Provider Notes (Signed)
?[facility] ?Provider Note ? ? ?CSN: [id number] ?Arrival date & time: 01/19/22  1654 ? ?  ? ?History ? ?Chief Complaint  ?Patient presents with  ? Tachycardia  ?  dizziness  ? ? ?Jacob Frank is a 71 y.o. male. ? ?HPI ? ?Patient is a 71 year old male with multiple medical history including asthma, COPD, CHF, atrial fibrillation, chronic EtOH abuse who presents to the emergency department for 2 weeks of progressively worsening lower leg numbness associated with fall.  He went to urgent care where he was found to be tachycardic.  He was sent to [facility] for further evaluation.  Patient reports for the past week he has been falling multiple times.  He feels like his leg gives out.  He currently denies any head injury or seizure activity.  He reports he has been admitted in the past for alcohol withdrawal.  He also reports progressively worsening shortness of breath and chest pain.  He does consume tobacco and drinks 12 packs every day.  He reports he has not been eating well for the past few days.  He currently denies any vomiting, abdominal pain or fever.  Otherwise no other complaints. ? ?Home Medications ?Prior to Admission medications   ?Medication Sig Start Date End Date Taking? Authorizing Provider  ?albuterol (PROAIR HFA) 108 (90 Base) MCG/ACT inhaler 2 PUFFS EVERY FOUR HOURS AS NEEDED FOR WHEEZING 10/18/21   McGowen, Adrian Blackwater, MD  ?albuterol (PROVENTIL) (2.5 MG/3ML) 0.083% nebulizer solution INHALE 1 VIAL VIA NEBULIZER EVERY 6 HOURS AS NEEDED FOR WHEEZING OR SHORTNESS OF BREATH. 10/18/21   McGowen, Adrian Blackwater, MD  ?amLODipine (NORVASC) 10 MG tablet TAKE (1) TABLET BY MOUTH ONCE DAILY. 07/12/21   McGowen, Adrian Blackwater, MD  ?aspirin EC 81 MG tablet Take 81 mg by mouth daily. Swallow whole.    [provider]  ?buPROPion (WELLBUTRIN XL) 300 MG 24 hr tablet TAKE (1) TABLET BY MOUTH ONCE DAILY. 10/18/21   McGowen, Adrian Blackwater, MD  ?clotrimazole (LOTRIMIN) 1 % cream Apply topically  2 (two) times daily. 01/27/21   [provider]  ?cyanocobalamin (,VITAMIN B-12,) 1000 MCG/ML injection Inject 1,000 mcg into the muscle every 30 (thirty) days.    [provider]  ?cyanocobalamin (,VITAMIN B-12,) 1000 MCG/ML injection Inject 1 mL (1,000 mcg total) into the muscle every 30 (thirty) days. ?Patient not taking: Reported on 11/27/2021 10/18/21   [name], MD  ?folic acid (FOLVITE) 1 MG tablet Take 1 tablet (1 mg total) by mouth daily. 10/18/21   McGowen, Adrian Blackwater, MD  ?furosemide (LASIX) 20 MG tablet 2 tabs po every day 11/27/21   [name], MD  ?Glycopyrrolate-Formoterol (BEVESPI AEROSPHERE) 9-4.8 MCG/ACT AERO 2 puffs bid 10/18/21   McGowen, Adrian Blackwater, MD  ?ibuprofen (ADVIL) 800 MG tablet 1 tab po qd prn joint pain 10/18/21   McGowen, Adrian Blackwater, MD  ?metoprolol succinate (TOPROL-XL) 100 MG 24 hr tablet 1 and 1/2 tabs po every day 07/12/21   [name], MD  ?Nebulizers (COMPRESSOR/NEBULIZER) MISC 1 unit dose of albuterol via nebulizer q4h prn wheezing and shortness of breath. 07/12/21   McGowen, Adrian Blackwater, MD  ?nystatin cream (MYCOSTATIN) Apply to groin rash 3 times per day as needed 07/12/21   McGowen, Adrian Blackwater, MD  ?pantoprazole (PROTONIX) 40 MG tablet TAKE (1) TABLET BY MOUTH ONCE DAILY. 11/08/21   McGowen, Adrian Blackwater, MD  ?potassium chloride SA (KLOR-CON M) 20 MEQ tablet Take 1 tablet (20 mEq  total) by mouth 2 (two) times daily. 11/29/21   McGowen, Adrian Blackwater, MD  ?QUEtiapine (SEROQUEL) 400 MG tablet TAKE 1 TABLET BY MOUTH TWICE DAILY. 10/18/21   McGowen, Adrian Blackwater, MD  ?SYRINGE-NEEDLE, DISP, 3 ML 25G X 5/8" 3 ML MISC USE TO ADMINISTER B12 INJECTION ?Patient not taking: Reported on 11/27/2021 10/19/21   [name], MD  ?thiamine 100 MG tablet Take 1 tablet (100 mg total) by mouth daily. ?Patient not taking: Reported on 11/27/2021 07/12/21   [name], MD  ?   ? ?Allergies    ?Citalopram   ? ?Review of Systems   ?Review of Systems ? ?Physical Exam ?Updated Vital  Signs ?BP (!) 148/94   Pulse 89   Temp 98.3 ?F (36.8 ?C)   Resp 18   Ht '5\' 9"'$  (1.753 m)   Wt 94.3 kg   SpO2 96%   BMI 30.72 kg/m?  ?Physical Exam ?Constitutional:   ?   General: He is not in acute distress. ?   Comments: Chronically ill-appearing  ?HENT:  ?   Head: Normocephalic.  ?   Nose: Nose normal. No congestion.  ?   Mouth/Throat:  ?   Mouth: Mucous membranes are moist.  ?   Pharynx: Oropharynx is clear.  ?Eyes:  ?   Extraocular Movements: Extraocular movements intact.  ?   Pupils: Pupils are equal, round, and reactive to light.  ?Cardiovascular:  ?   Rate and Rhythm: Normal rate.  ?Pulmonary:  ?   Effort: No respiratory distress.  ?   Breath sounds: No wheezing or rhonchi.  ?Chest:  ?   Chest wall: No tenderness.  ?Abdominal:  ?   General: There is no distension.  ?   Tenderness: There is no abdominal tenderness. There is no guarding or rebound.  ?Musculoskeletal:     ?   General: No swelling, deformity or signs of injury. Normal range of motion.  ?   Cervical back: Normal range of motion. No rigidity or tenderness.  ?Skin: ?   General: Skin is warm.  ?   Capillary Refill: Capillary refill takes less than 2 seconds.  ?Neurological:  ?   General: No focal deficit present.  ?   Mental Status: He is alert and oriented to person, place, and time.  ?   Sensory: No sensory deficit.  ? ? ?ED Results / Procedures / Treatments   ?Labs ?(all labs ordered are listed, but only abnormal results are displayed) ?Labs Reviewed  ?BASIC METABOLIC PANEL - Abnormal; Notable for the following components:  ?    Result Value  ? Sodium 121 (*)   ? Chloride 87 (*)   ? All other components within normal limits  ?CBC WITH DIFFERENTIAL/PLATELET - Abnormal; Notable for the following components:  ? RBC 4.07 (*)   ? HCT 38.0 (*)   ? All other components within normal limits  ?ETHANOL  ?MAGNESIUM  ?SODIUM, URINE, RANDOM  ?RAPID URINE DRUG SCREEN, HOSP PERFORMED  ?VITAMIN B12  ?HIV ANTIBODY (ROUTINE TESTING W REFLEX)  ?OSMOLALITY,  URINE  ?OSMOLALITY  ?PHOSPHORUS  ?BASIC METABOLIC PANEL  ?BASIC METABOLIC PANEL  ?BASIC METABOLIC PANEL  ?BASIC METABOLIC PANEL  ?CBC  ?HEPATIC FUNCTION PANEL  ?CBG MONITORING, ED  ? ? ?EKG ?EKG Interpretation ? ?Date/Time:  Friday January 19 2022 19:18:26 EDT ?Ventricular Rate:  90 ?PR Interval:  184 ?QRS Duration: 86 ?QT Interval:  354 ?QTC Calculation: 434 ?R Axis:   82 ?Text Interpretation: Sinus rhythm Anterior infarct, old  No significant change since last tracing Confirmed by Deno Etienne [phone]) on 01/19/2022 7:33:14 PM ? ?Radiology ?CT Head Wo Contrast ? ?Result Date: 01/19/2022 ?CLINICAL DATA:  Provided history: Facial fracture, follow-up. EXAM: CT HEAD WITHOUT CONTRAST TECHNIQUE: Contiguous axial images were obtained from the base of the skull through the vertex without intravenous contrast. RADIATION DOSE REDUCTION: This exam was performed according to the departmental dose-optimization program which includes automated exposure control, adjustment of the mA and/or kV according to patient size and/or use of iterative reconstruction technique. COMPARISON:  Prior head CT 03/25/2020 and earlier. FINDINGS: Mildly motion degraded exam. Brain: Mild generalized parenchymal atrophy. Moderate to advanced patchy and confluent hypoattenuation within the cerebral white matter, nonspecific but compatible with chronic small vessel ischemic disease. There is no acute intracranial hemorrhage. No demarcated cortical infarct. No extra-axial fluid collection. No evidence of an intracranial mass. No midline shift. Vascular: Dolichoectasia a of the basilar artery. No hyperdense vessel. Atherosclerotic calcifications. Skull: Normal. Negative for fracture or focal lesion. Sinuses/Orbits: Visualized orbits show no acute finding. Trace scattered paranasal sinus mucosal thickening at the imaged levels. Other: No evidence of acute maxillofacial fracture at the imaged levels. Redemonstrated chronic fracture deformity of the left zygomatic  arch. IMPRESSION: Mildly motion degraded exam. No evidence of acute intracranial abnormality. Moderate to advanced chronic small vessel image changes within the cerebral white matter. No evidence of acute maxillof

## 2022-01-19 NOTE — Discharge Instructions (Addendum)
[facility] ?[place] ?Discussion, Open ?10.87 miles from the center of [place], [place] ? ?[facility] [place] [facility] ?[employer] ?Discussion, Open ?11.05 miles from the center of [place], [place] ? ?[place] [facility] ?[place] ?[place] [facility] - Alcoholics Anonymous ?14.35 miles from the center of [place], [place] ? ?[place] [facility] ?[place] ?[employer] [facility] - Alcoholics Anonymous ?14.57 miles from the center of [place], [place] ? ?[facility] ?[employer] ?[place] [facility] - Alcoholics Anonymous ?17.59 miles from the center of [place], [place] ? ?[facility] ?[employer] ?[place] [facility] - Alcoholics Anonymous ?17.69 miles from the center of [place], [place] ? ?[facility] [place] [facility] ?[place] ?[place] [facility] - Alcoholics Anonymous ?19.79 miles from the center of [place], [place] ? ?[facility] ?[place] ?[place] [facility] - Alcoholics Anonymous ?19.79 miles from the center of [place], [place] ? ?https://www.google.com/url?sa=t&rct=j&q=&esrc=s&source=web&cd=&cad=rja&uact=8&ved=2ahUKEwjxpPOwrLz-AhVTlWoFHWJxD8YQFnoECA0QAQ&url=https%3A%61F%61Fwww.bmbhspsych.com%61F&usg=AOvVaw0u_wJBZm655lXF-BM95PWQ ? ?[facility] [phone] ?[place]: [facility] [phone]  [place] [facility] Crisis line: [phone] 24/7 ? ?

## 2022-01-19 NOTE — ED Triage Notes (Signed)
The pt arrived by gems from a doctors office  the pt reports that he has been dizzy for one months  also today in the doctors office he had a fast heart beat no pain at present iv per ems and he was given 500 ml of nss prior to arrival here alert oriented  skin warm and dry ?
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
## 2022-01-20 ENCOUNTER — Inpatient Hospital Stay (HOSPITAL_COMMUNITY): Payer: Medicare Other

## 2022-01-20 ENCOUNTER — Encounter (HOSPITAL_COMMUNITY): Payer: Self-pay | Admitting: Internal Medicine

## 2022-01-20 DIAGNOSIS — F419 Anxiety disorder, unspecified: Secondary | ICD-10-CM

## 2022-01-20 DIAGNOSIS — I6330 Cerebral infarction due to thrombosis of unspecified cerebral artery: Secondary | ICD-10-CM | POA: Diagnosis not present

## 2022-01-20 DIAGNOSIS — Z6826 Body mass index (BMI) 26.0-26.9, adult: Secondary | ICD-10-CM | POA: Diagnosis not present

## 2022-01-20 DIAGNOSIS — E878 Other disorders of electrolyte and fluid balance, not elsewhere classified: Secondary | ICD-10-CM | POA: Diagnosis present

## 2022-01-20 DIAGNOSIS — F1010 Alcohol abuse, uncomplicated: Secondary | ICD-10-CM | POA: Diagnosis present

## 2022-01-20 DIAGNOSIS — F17210 Nicotine dependence, cigarettes, uncomplicated: Secondary | ICD-10-CM | POA: Diagnosis present

## 2022-01-20 DIAGNOSIS — Z7982 Long term (current) use of aspirin: Secondary | ICD-10-CM | POA: Diagnosis not present

## 2022-01-20 DIAGNOSIS — Z8261 Family history of arthritis: Secondary | ICD-10-CM | POA: Diagnosis not present

## 2022-01-20 DIAGNOSIS — R531 Weakness: Secondary | ICD-10-CM

## 2022-01-20 DIAGNOSIS — R270 Ataxia, unspecified: Secondary | ICD-10-CM | POA: Diagnosis not present

## 2022-01-20 DIAGNOSIS — F411 Generalized anxiety disorder: Secondary | ICD-10-CM | POA: Diagnosis present

## 2022-01-20 DIAGNOSIS — I10 Essential (primary) hypertension: Secondary | ICD-10-CM

## 2022-01-20 DIAGNOSIS — I482 Chronic atrial fibrillation, unspecified: Secondary | ICD-10-CM | POA: Diagnosis not present

## 2022-01-20 DIAGNOSIS — H905 Unspecified sensorineural hearing loss: Secondary | ICD-10-CM | POA: Diagnosis present

## 2022-01-20 DIAGNOSIS — F32A Depression, unspecified: Secondary | ICD-10-CM | POA: Diagnosis present

## 2022-01-20 DIAGNOSIS — M159 Polyosteoarthritis, unspecified: Secondary | ICD-10-CM | POA: Diagnosis present

## 2022-01-20 DIAGNOSIS — K7460 Unspecified cirrhosis of liver: Secondary | ICD-10-CM | POA: Diagnosis present

## 2022-01-20 DIAGNOSIS — E871 Hypo-osmolality and hyponatremia: Secondary | ICD-10-CM | POA: Diagnosis present

## 2022-01-20 DIAGNOSIS — I639 Cerebral infarction, unspecified: Secondary | ICD-10-CM | POA: Diagnosis not present

## 2022-01-20 DIAGNOSIS — N401 Enlarged prostate with lower urinary tract symptoms: Secondary | ICD-10-CM | POA: Diagnosis present

## 2022-01-20 DIAGNOSIS — N138 Other obstructive and reflux uropathy: Secondary | ICD-10-CM | POA: Diagnosis present

## 2022-01-20 DIAGNOSIS — J449 Chronic obstructive pulmonary disease, unspecified: Secondary | ICD-10-CM | POA: Diagnosis not present

## 2022-01-20 DIAGNOSIS — E538 Deficiency of other specified B group vitamins: Secondary | ICD-10-CM | POA: Diagnosis present

## 2022-01-20 DIAGNOSIS — I6389 Other cerebral infarction: Secondary | ICD-10-CM | POA: Diagnosis not present

## 2022-01-20 DIAGNOSIS — Z888 Allergy status to other drugs, medicaments and biological substances status: Secondary | ICD-10-CM | POA: Diagnosis not present

## 2022-01-20 DIAGNOSIS — D649 Anemia, unspecified: Secondary | ICD-10-CM | POA: Diagnosis present

## 2022-01-20 DIAGNOSIS — E440 Moderate protein-calorie malnutrition: Secondary | ICD-10-CM | POA: Diagnosis present

## 2022-01-20 DIAGNOSIS — G909 Disorder of the autonomic nervous system, unspecified: Secondary | ICD-10-CM | POA: Diagnosis present

## 2022-01-20 DIAGNOSIS — R627 Adult failure to thrive: Secondary | ICD-10-CM | POA: Diagnosis present

## 2022-01-20 DIAGNOSIS — Z79899 Other long term (current) drug therapy: Secondary | ICD-10-CM | POA: Diagnosis not present

## 2022-01-20 LAB — BASIC METABOLIC PANEL
Anion gap: 11 (ref 5–15)
Anion gap: 10 (ref 5–15)
BUN: 13 mg/dL (ref 8–23)
BUN: 12 mg/dL (ref 8–23)
CO2: 24 mmol/L (ref 22–32)
CO2: 23 mmol/L (ref 22–32)
Calcium: 8.8 mg/dL — ABNORMAL LOW (ref 8.9–10.3)
Calcium: 8.6 mg/dL — ABNORMAL LOW (ref 8.9–10.3)
Chloride: 91 mmol/L — ABNORMAL LOW (ref 98–111)
Chloride: 92 mmol/L — ABNORMAL LOW (ref 98–111)
Creatinine, Ser: 1.01 mg/dL (ref 0.61–1.24)
Creatinine, Ser: 0.88 mg/dL (ref 0.61–1.24)
GFR, Estimated: >60 mL/min (ref >60)
GFR, Estimated: >60 mL/min (ref >60)
Glucose, Bld: 88 mg/dL (ref 70–99)
Glucose, Bld: 134 mg/dL — ABNORMAL HIGH (ref 70–99)
Potassium: 4 mmol/L (ref 3.5–5.1)
Potassium: 3.9 mmol/L (ref 3.5–5.1)
Sodium: 126 mmol/L — ABNORMAL LOW (ref 135–145)
Sodium: 125 mmol/L — ABNORMAL LOW (ref 135–145)

## 2022-01-20 LAB — HEMOGLOBIN A1C
Hgb A1c MFr Bld: 4.9 % (ref 4.8–5.6)
Mean Plasma Glucose: 93.93 mg/dL

## 2022-01-20 LAB — CBC
HCT: 35.8 % — ABNORMAL LOW (ref 39.0–52.0)
Hemoglobin: 12.6 g/dL — ABNORMAL LOW (ref 13.0–17.0)
MCH: 32.7 pg (ref 26.0–34.0)
MCHC: 35.2 g/dL (ref 30.0–36.0)
MCV: 93 fL (ref 80.0–100.0)
Platelets: 181 10*3/uL (ref 150–400)
RBC: 3.85 MIL/uL — ABNORMAL LOW (ref 4.22–5.81)
RDW: 14.4 % (ref 11.5–15.5)
WBC: 4.4 10*3/uL (ref 4.0–10.5)
nRBC: 0 % (ref 0.0–0.2)

## 2022-01-20 IMAGING — MR MR MRA NECK W/O CM
1 of 2 series · 20 of 48 positions shown · non-contrast
Comparison: None.

CLINICAL DATA: Stroke follow-up

EXAM:
MRA NECK WITHOUT CONTRAST
MRA HEAD WITHOUT CONTRAST
TECHNIQUE: Angiographic images of the Circle of Willis were acquired using MRA
technique without intravenous contrast.

[Series 3: sag inhance (id) · sagittal · 1.2mm · 0.47mm/px · 20 of 368 slices shown]
[im 1/368]
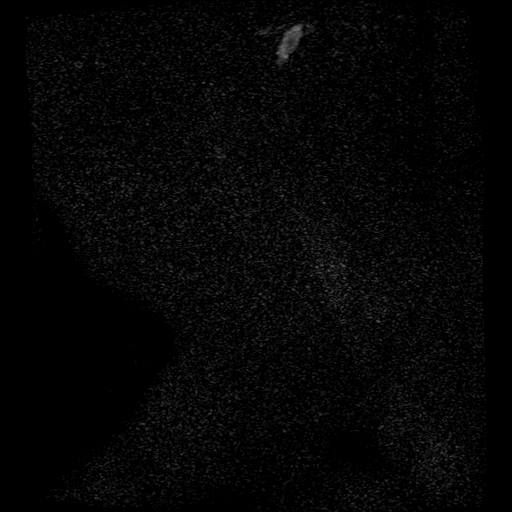
[im 12/368]
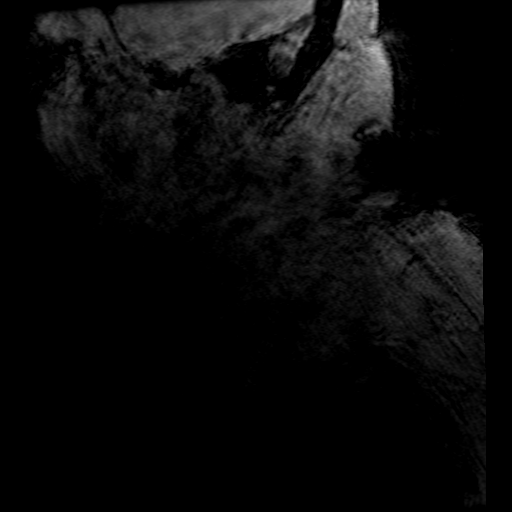
[im 23/368]
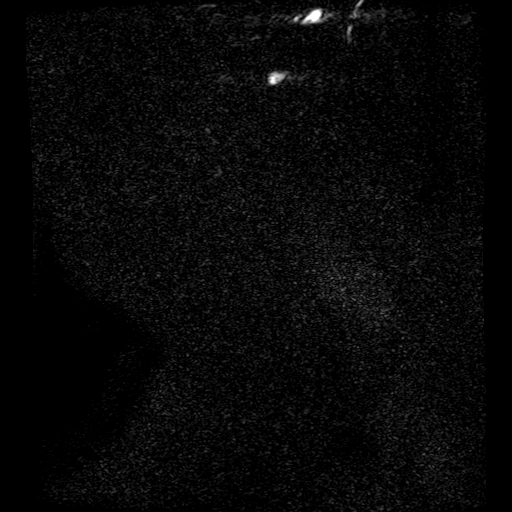
[im 34/368]
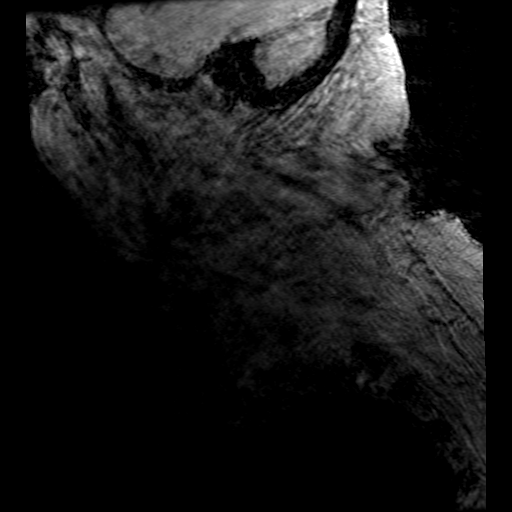
[im 45/368]
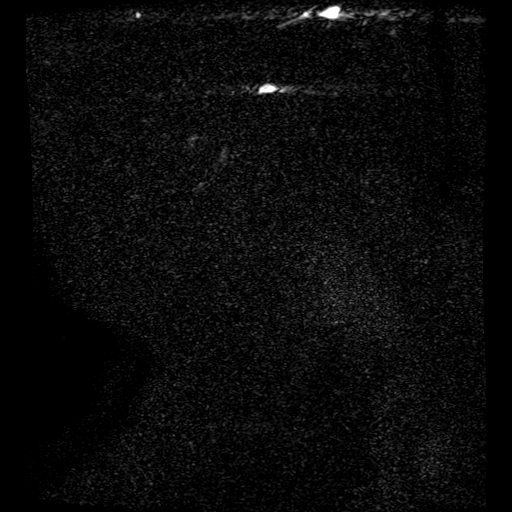
[im 56/368]
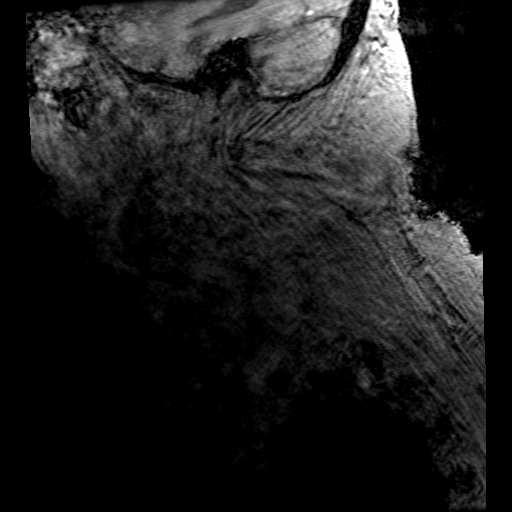
[im 67/368]
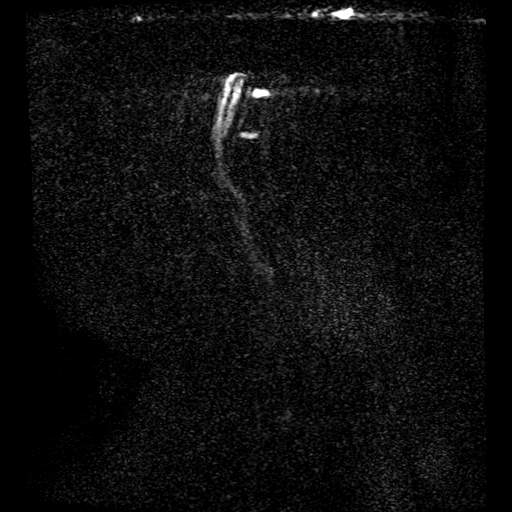
[im 78/368]
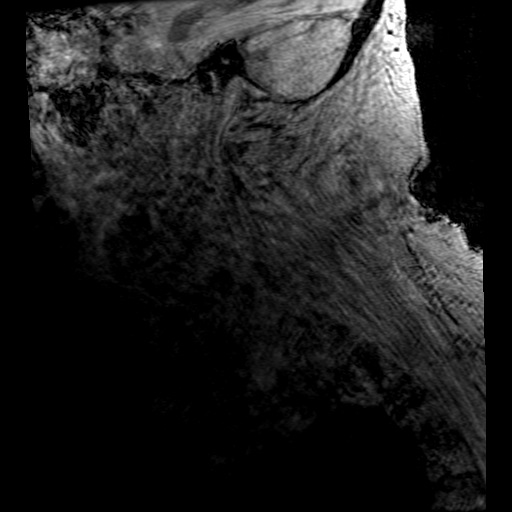
[im 89/368]
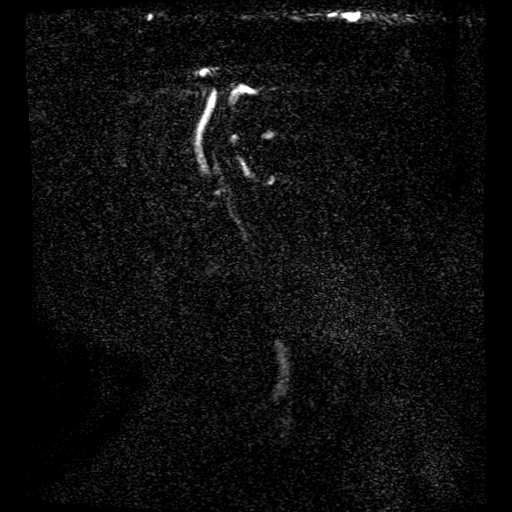
[im 101/368]
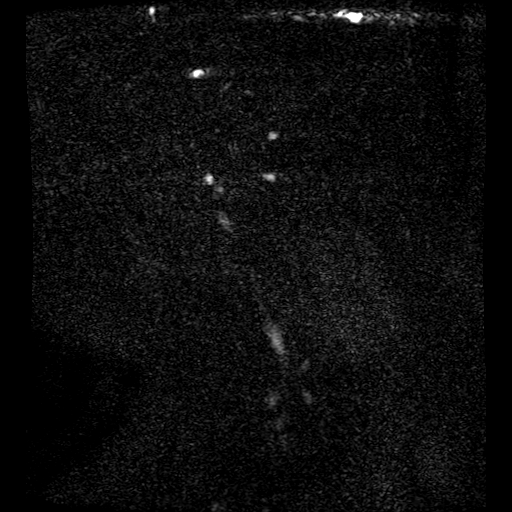
[im 112/368]
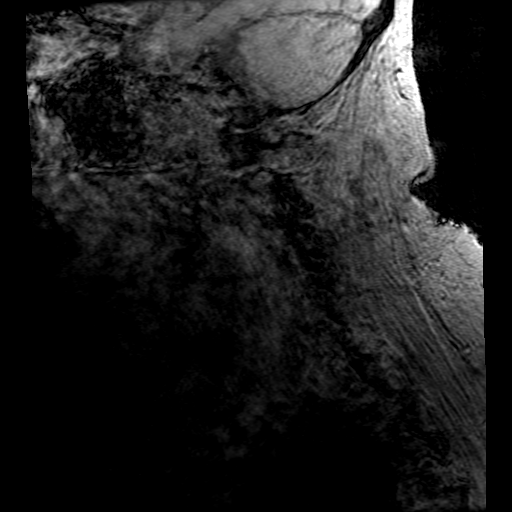
[im 123/368]
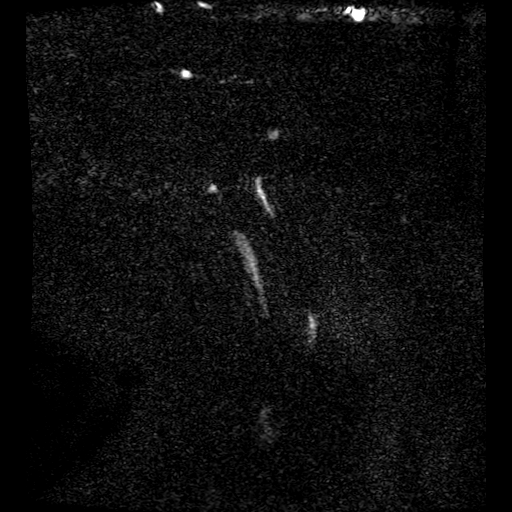
[im 134/368]
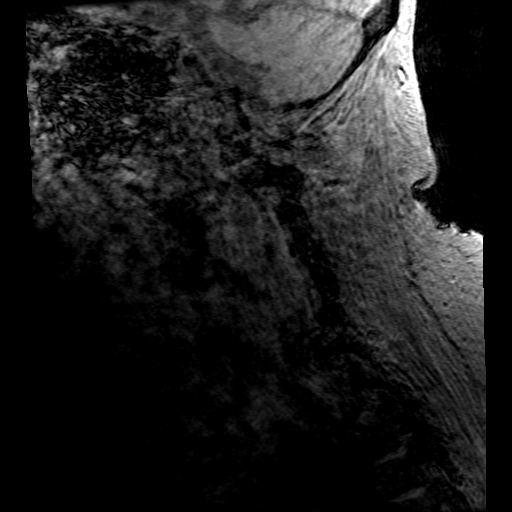
[im 156/368]
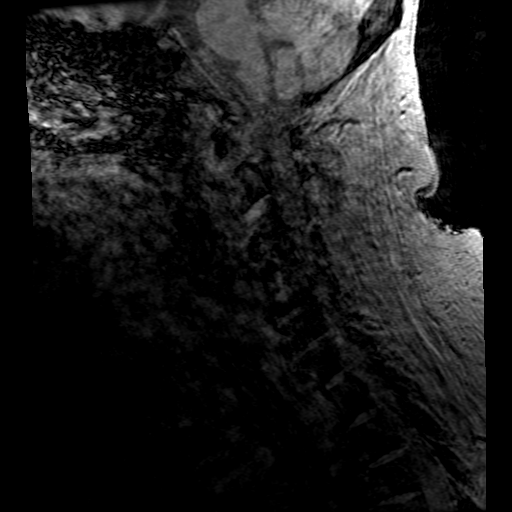
[im 190/368]
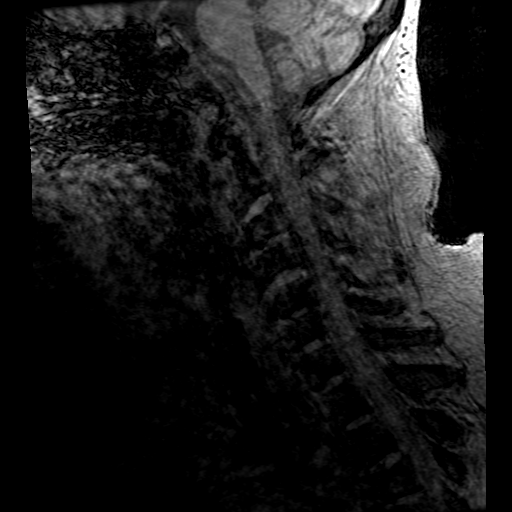
[im 212/368]
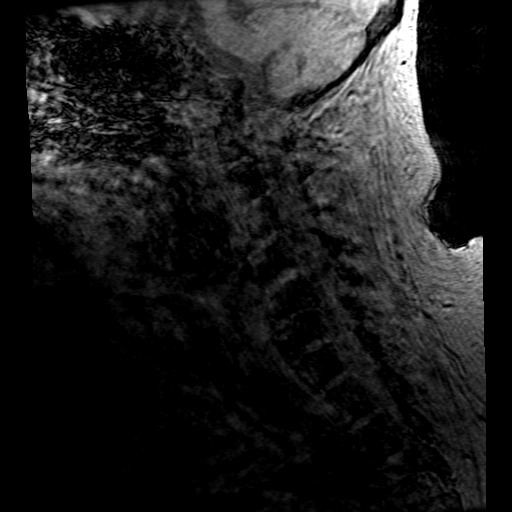
[im 256/368]
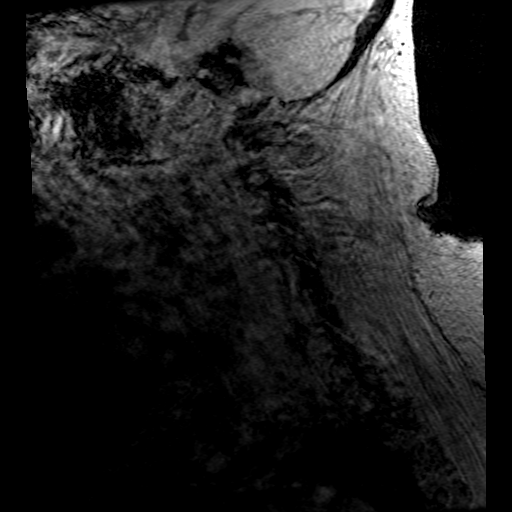
[im 301/368]
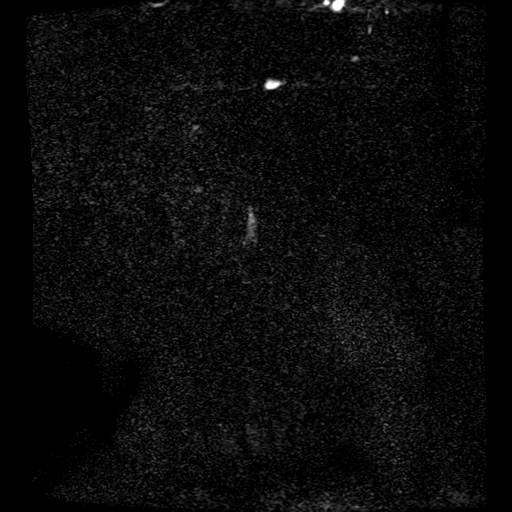
[im 312/368]
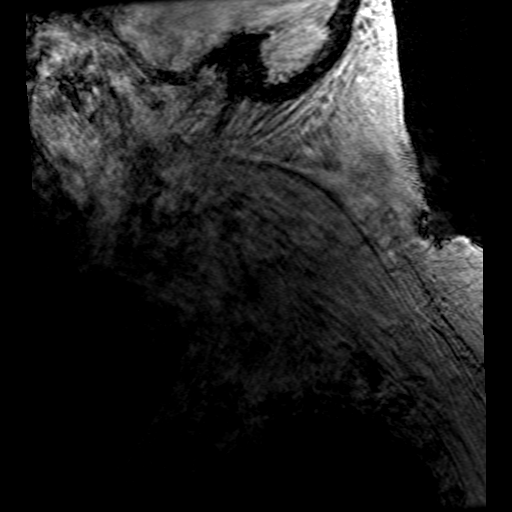
[im 345/368]
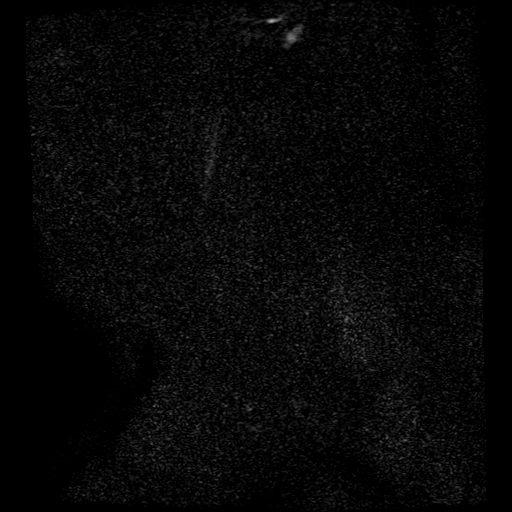

[20 of 48 positions shown; findings below may reference images not displayed]

FINDINGS: MRA NECK FINDINGS

No occlusion or stenosis of the carotid or vertebral arteries based
on time-of-flight imaging of the neck. Vertebral system is
codominant.

MRA HEAD FINDINGS

POSTERIOR CIRCULATION:

--Vertebral arteries: Normal

--Inferior cerebellar arteries: Normal.

--Basilar artery: Normal.

--Superior cerebellar arteries: Normal.

--Posterior cerebral arteries: Normal.

ANTERIOR CIRCULATION:

--Intracranial internal carotid arteries: Normal.

--Anterior cerebral arteries (ACA): Normal.

--Middle cerebral arteries (MCA): Normal.

ANATOMIC VARIANTS: None
IMPRESSION: Normal MRA of the head and neck.

## 2022-01-20 IMAGING — MR MR MRA HEAD W/O CM
2 series · 19 of 48 positions shown · non-contrast
Comparison: None.

CLINICAL DATA: Stroke follow-up

EXAM:
MRA NECK WITHOUT CONTRAST
MRA HEAD WITHOUT CONTRAST
TECHNIQUE: Angiographic images of the Circle of Willis were acquired using MRA
technique without intravenous contrast.

[Series 2: ax (id) · axial · 1.0mm · 0.43mm/px · z∈[-98,+19]mm · 18 of 248 slices shown]
[im 1/248]
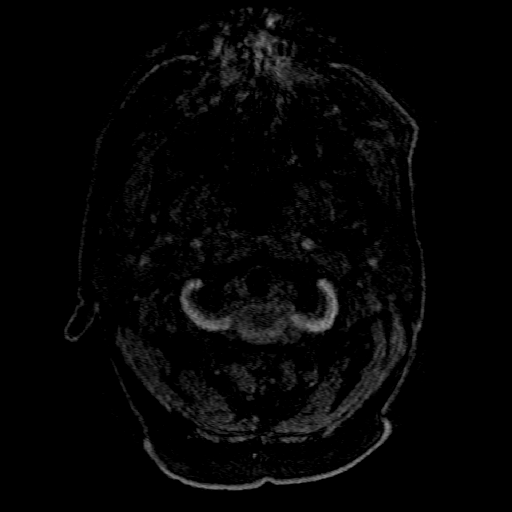
[im 6/248]
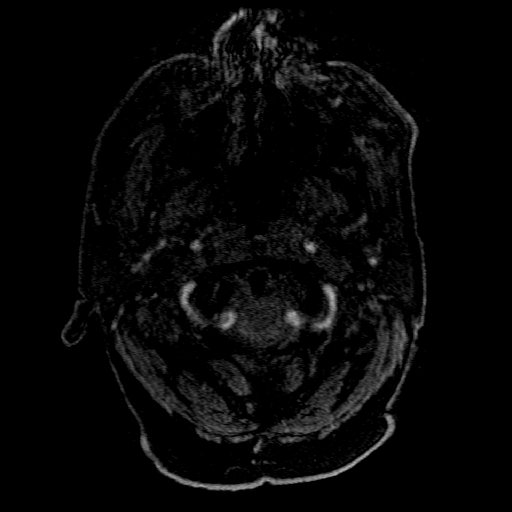
[im 11/248]
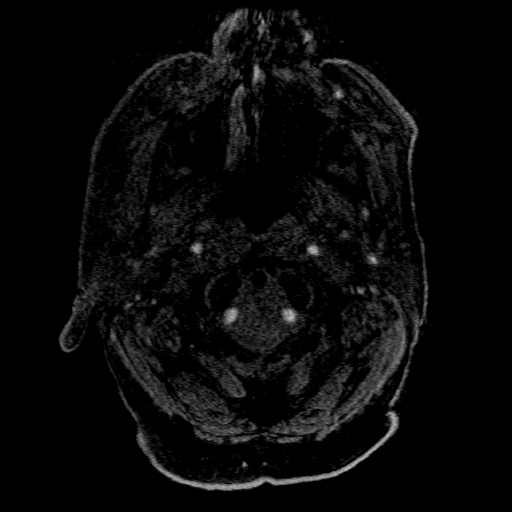
[im 17/248]
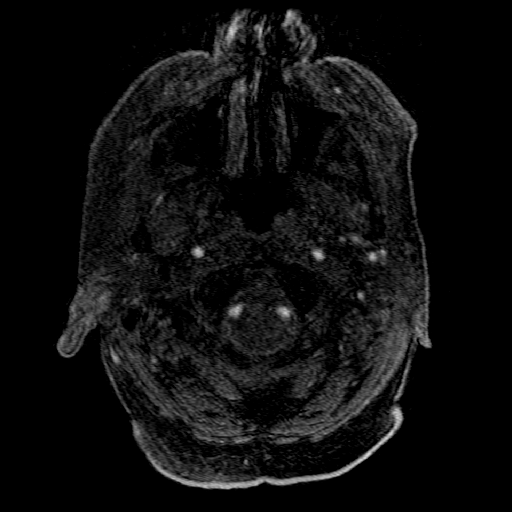
[im 22/248]
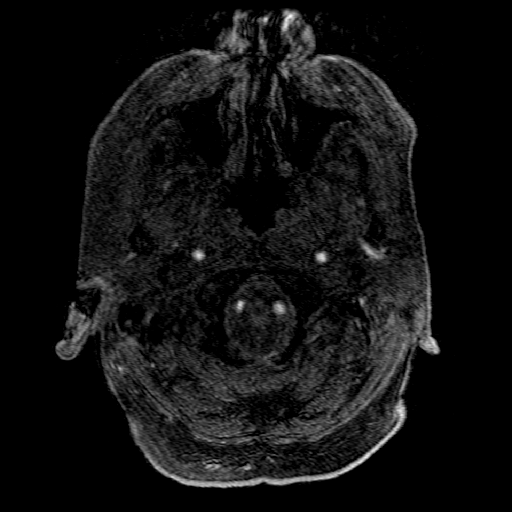
[im 27/248]
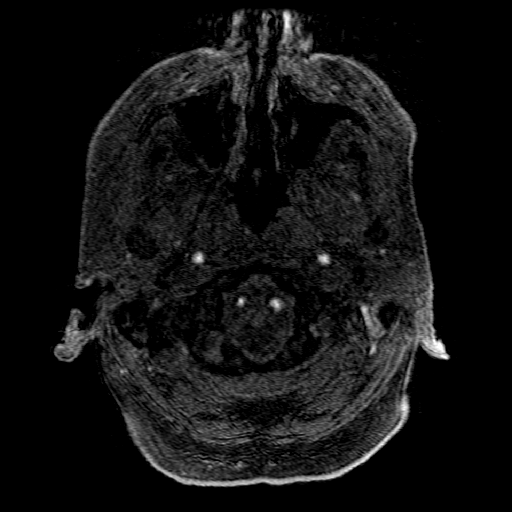
[im 33/248]
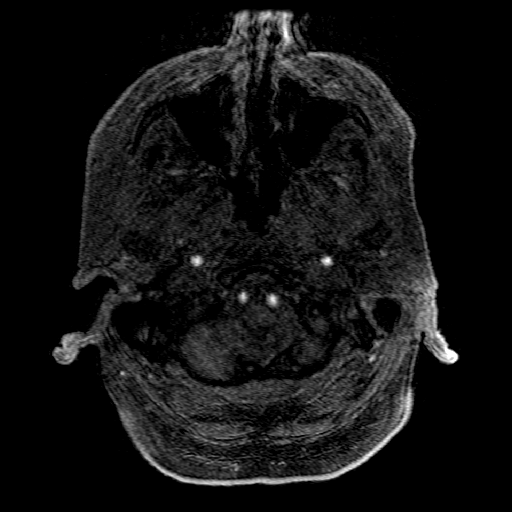
[im 38/248]
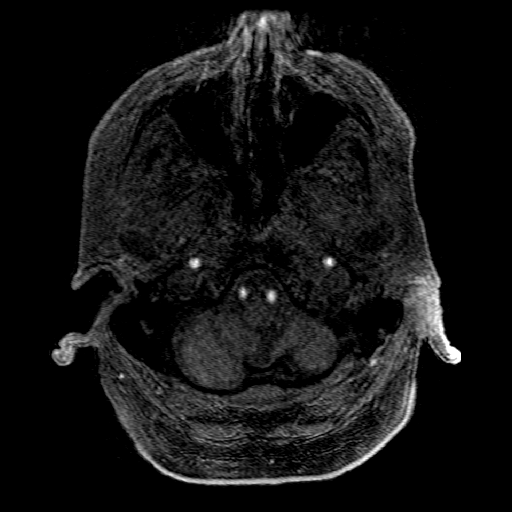
[im 43/248]
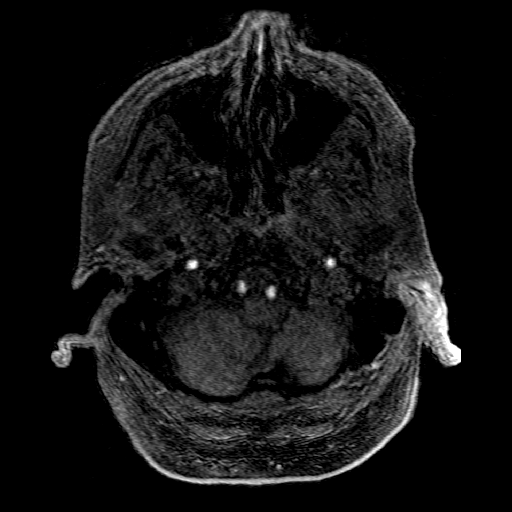
[im 49/248]
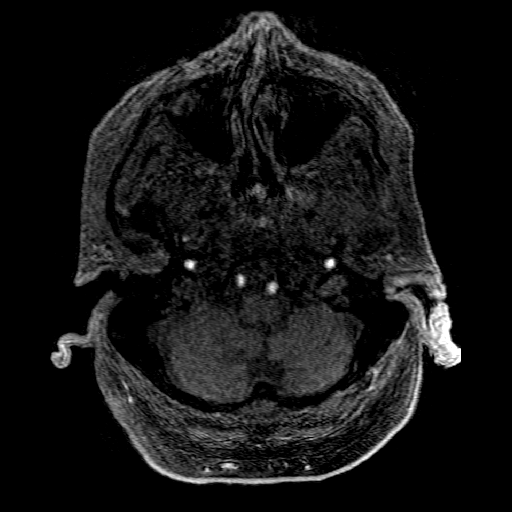
[im 76/248]
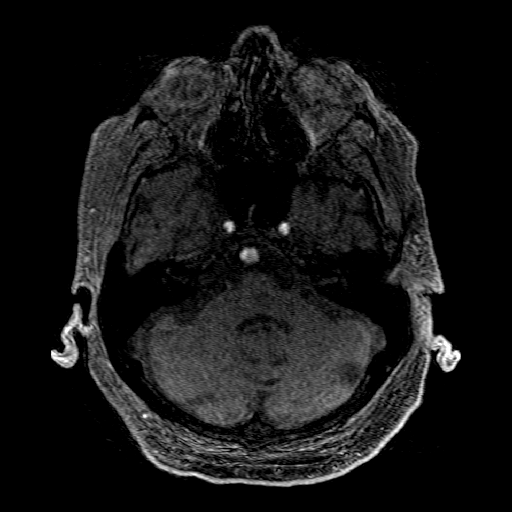
[im 108/248]
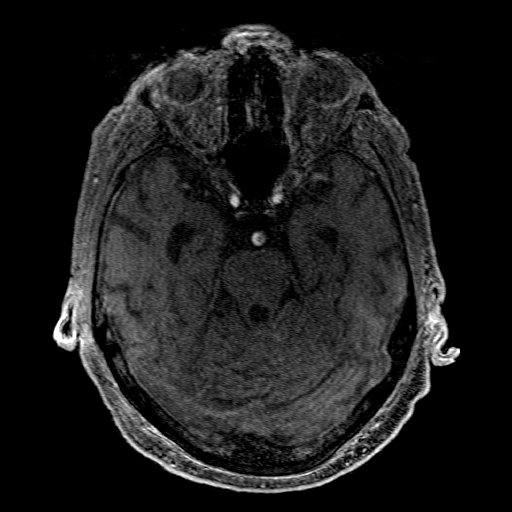
[im 124/248]
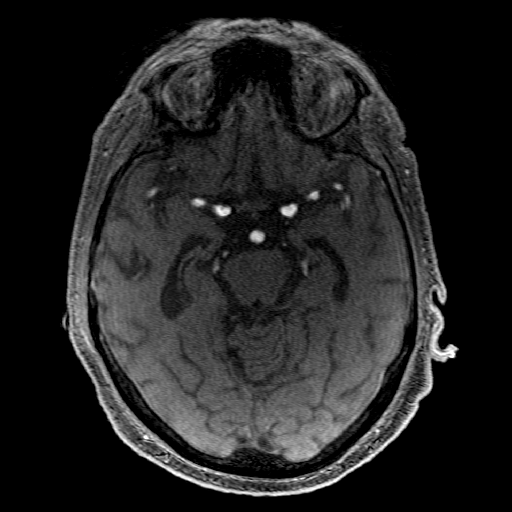
[im 140/248]
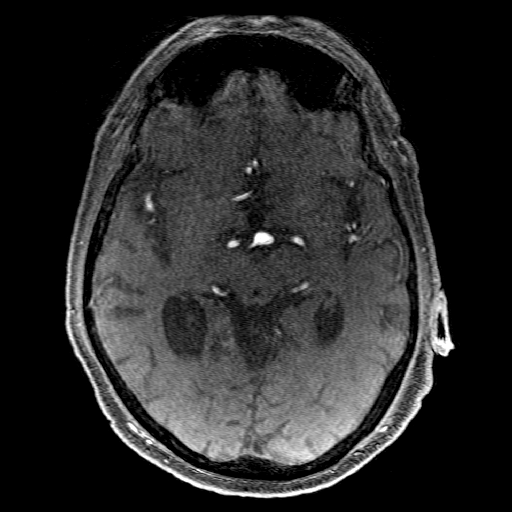
[im 172/248]
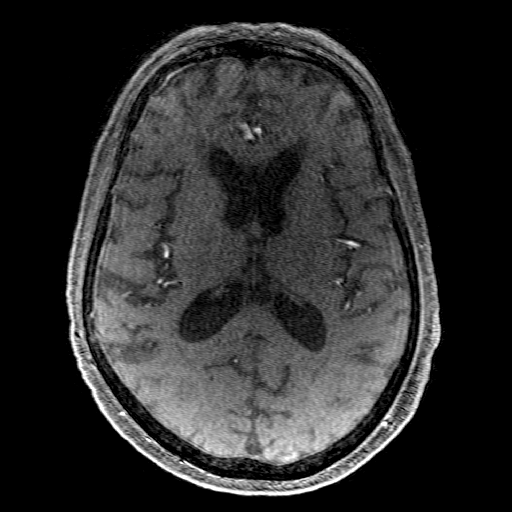
[im 205/248]
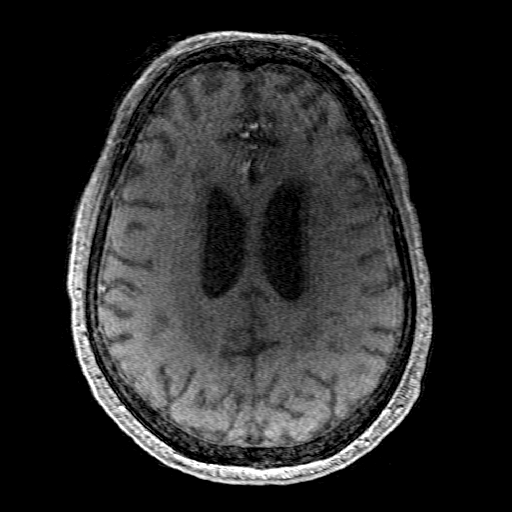
[im 210/248]
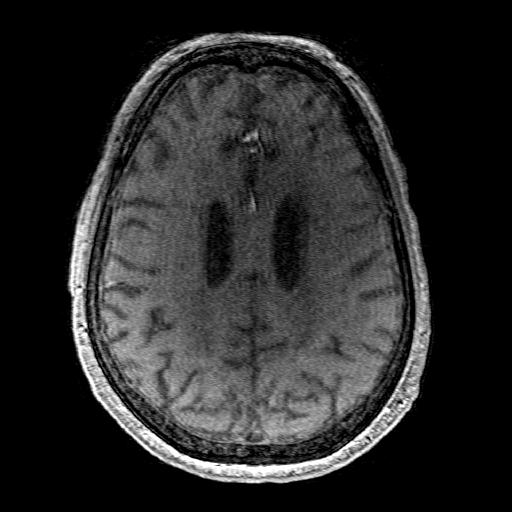
[im 237/248]
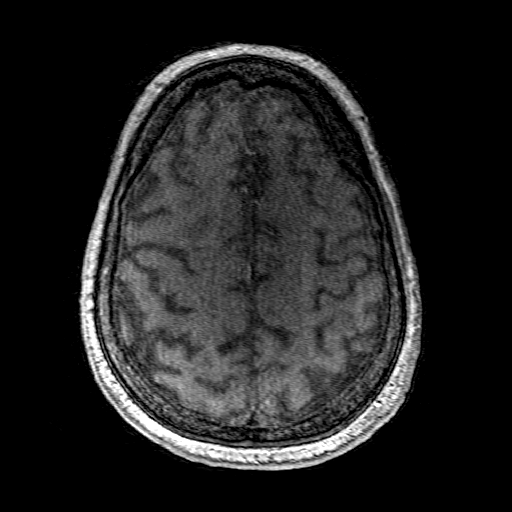

[Series 253: post. tumble · axial · 1.0mm · 0.39mm/px · 1 of 1 slices shown]
[im 1/1]
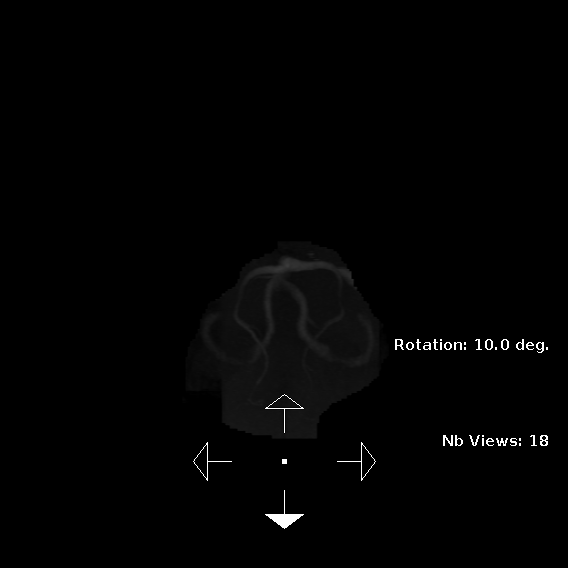

[19 of 48 positions shown; findings below may reference images not displayed]

FINDINGS: MRA NECK FINDINGS

No occlusion or stenosis of the carotid or vertebral arteries based
on time-of-flight imaging of the neck. Vertebral system is
codominant.

MRA HEAD FINDINGS

POSTERIOR CIRCULATION:

--Vertebral arteries: Normal

--Inferior cerebellar arteries: Normal.

--Basilar artery: Normal.

--Superior cerebellar arteries: Normal.

--Posterior cerebral arteries: Normal.

ANTERIOR CIRCULATION:

--Intracranial internal carotid arteries: Normal.

--Anterior cerebral arteries (ACA): Normal.

--Middle cerebral arteries (MCA): Normal.

ANATOMIC VARIANTS: None
IMPRESSION: Normal MRA of the head and neck.

## 2022-01-20 MED ORDER — ASPIRIN 325 MG PO TABS
325.0000 mg | ORAL_TABLET | Freq: Every day | ORAL | Status: DC
  Administered 2022-01-20 – 2022-01-26 (×7): 325 mg via ORAL
  Filled 2022-01-20 (×7): qty 1

## 2022-01-20 MED ORDER — STROKE: EARLY STAGES OF RECOVERY BOOK: Freq: Once | Status: DC

## 2022-01-20 MED ORDER — SODIUM CHLORIDE 0.9 % IV SOLN: INTRAVENOUS | Status: AC

## 2022-01-20 MED ORDER — ASPIRIN 300 MG RE SUPP
300.0000 mg | Freq: Every day | RECTAL | Status: DC
  Filled 2022-01-20 (×6): qty 1

## 2022-01-20 MED ORDER — QUETIAPINE FUMARATE 100 MG PO TABS
400.0000 mg | ORAL_TABLET | Freq: Once | ORAL | Status: AC
  Administered 2022-01-20: 400 mg via ORAL
  Filled 2022-01-20: qty 4

## 2022-01-20 NOTE — Progress Notes (Signed)
Pt is back from MRI.

## 2022-01-20 NOTE — Progress Notes (Deleted)
?   01/20/22 0747  ?Orthostatic Lying   ?BP- Lying (!) 130/102  ?Orthostatic Sitting  ?BP- Sitting 117/78  ?Pulse- Sitting 97  ?Orthostatic Standing at 0 minutes  ?BP- Standing at 0 minutes 92/82  ?Pulse- Standing at 0 minutes 105  ? ? ?

## 2022-01-20 NOTE — Progress Notes (Signed)
OT Cancellation Note ? ?Patient Details ?Name: Jacob Frank ?MRN: [id number] ?DOB: Feb 25, 1951 ? ? ?Cancelled Treatment:    Reason Eval/Treat Not Completed: Patient at procedure or test/ unavailable. Will continue efforts as appropriate.   ? ?Octavion Mollenkopf D Skyann Ganim ?01/20/2022, 3:03 PM ?

## 2022-01-20 NOTE — Plan of Care (Signed)
  Problem: Education: Goal: Knowledge of General Education information will improve Description: Including pain rating scale, medication(s)/side effects and non-pharmacologic comfort measures Outcome: Progressing   Problem: Nutrition: Goal: Adequate nutrition will be maintained Outcome: Progressing   Problem: Safety: Goal: Ability to remain free from injury will improve Outcome: Progressing   

## 2022-01-20 NOTE — Progress Notes (Signed)
Carotid artery duplex completed. ?Refer to "CV Proc" under chart review to view preliminary results. ? ?01/20/2022 2:56 PM ?Kelby Aline., MHA, RVT, RDCS, RDMS   ?

## 2022-01-20 NOTE — Progress Notes (Signed)
? ? ? [facility] ?                                                                            ? ? ?Jacob Frank, is a 71 y.o. male, DOB - [birth year], BDZ:[id number] ?Admit date - 01/19/2022    ?Outpatient Primary MD for the patient is McGowen, Adrian Blackwater, MD ? ?LOS - 0  days ? ? ? ?Brief summary  ? ?Jacob Frank is a 71 y.o. male with medical history significant of alcohol use, atrial fibrillation, COPD, hypertension, B12 deficiency, anxiety, BPH presenting with progressive weakness and dizziness. ? ?Patient has had at least 2 weeks of increasing weakness now needing assistance even to walk.  Of note he has had decreased p.o. intake for at least several weeks per PCP note.  He currently lives in a boardinghouse and has been able to be compliant with his medications. ?He was found to have hyponatremia. CT head without any abnormality.  ?  ?Assessment & Plan  ? ? ?Assessment and Plan: ? ?Generalized weakness with dizziness and ataxia secondary to Hyponatremia and subacute ischemia within the left frontal white ?matter.  ?In the setting of chronic alcohol use and decreased intake for the last few weeks.  ?- probably secondary to hyponatremia, improving gradually.  ?- neurology consulted for evaluation of acute stroke.  ?- start the patient on aspirin 325 mg daily.  ?- lipid panel, hemoglobin A1c, echocardiogram and carotid duplex ordered.  ?- further eval as per neurology.  ?- Therapy evaluations ordered.  ?- UDS is negative.  ? ? ?Paroxysmal  atrial fibrillation.  ?Rate controlled with metoprolol. Not on anti coagulation.  ?Echocardiogram ordered and pending.  ? ? ?Hypertension:  ?Bp parameters are optimal.  ? ? ?Chronic alcohol use:  ?Alcohol level is negative.  ?On CIWA For withdrawal symptoms.  ?Continue with thiamine and folic acid.  ? ? ?Acute hyponatremia:  ?Probably secondary to chronic alcohol use.  ?Serum osmo is 254, urine osmo is 322, urine sodium  is less than 10 .  ? ? ? ?Mild anemia  ?Monitor.  ? ?   ?COPD:  ?No wheezing heard.  ? ? ? ?  ? ?Estimated body mass index is 30.72 kg/m? as calculated from the following: ?  Height as of this encounter: '5\' 9"'$  (1.753 m). ?  Weight as of this encounter: 94.3 kg. ? ?Code Status: full code.  ?DVT Prophylaxis:  enoxaparin (LOVENOX) injection 40 mg Start: 01/19/22 2045 ? ? ?Level of Care: Level of care: Telemetry Medical ?Family Communication: None at bedside.  ? ?Disposition Plan:     Remains inpatient appropriate:  stroke work up, hyponatremia.  ? ?Procedures:  ?MRI brain.  ?Echocardiogram.  ? ?Consultants:   ?Neurology ?Cardiology. ? ?Antimicrobials:  ? ?Anti-infectives (From admission, onward)  ? ? None  ? ?  ? ? ? ?Medications ? ?Scheduled Meds: ?  stroke: early stages of recovery book   Does not apply Once  ? arformoterol  15 mcg Nebulization BID  ? And  ? umeclidinium bromide  1 puff Inhalation Daily  ? aspirin  300 mg Rectal Daily  ? Or  ? aspirin  325 mg Oral Daily  ? enoxaparin (  LOVENOX) injection  40 mg Subcutaneous [id number]  ? folic acid  1 mg Oral Daily  ? metoprolol succinate  100 mg Oral Daily  ? multivitamin with minerals  1 tablet Oral Daily  ? pantoprazole  40 mg Oral Daily  ? sodium chloride flush  3 mL Intravenous Q12H  ? thiamine injection  100 mg Intravenous Daily  ? ?Continuous Infusions: ? sodium chloride 75 mL/hr at 01/20/22 0258  ? ?PRN Meds:.acetaminophen **OR** acetaminophen, albuterol, LORazepam **OR** LORazepam, polyethylene glycol ? ? ? ?Subjective:  ? ?Jacob Frank was seen and examined today.  Wants to eat. No new complaints. Feels weak.  ? ?Objective:  ? ?Vitals:  ? 01/20/22 1029 01/20/22 1130 01/20/22 1330 01/20/22 1400  ?BP: (!) 141/97 112/76 125/81 114/86  ?Pulse: 96 85 80 80  ?Resp:  20 (!) 21 19  ?Temp:    98.1 ?F (36.7 ?C)  ?TempSrc:    Oral  ?SpO2:  95% 94% 93%  ?Weight:      ?Height:      ? ? ?Intake/Output Summary (Last 24 hours) at 01/20/2022 1418 ?Last data filed at 01/19/2022 1952 ?Gross per 24 hour  ?Intake 1000 ml  ?Output --  ?Net 1000  ml  ? ?Filed Weights  ? 01/19/22 1658  ?Weight: 94.3 kg  ? ? ? ?Exam ?General exam: Appears calm and comfortable  ?Respiratory system: Clear to auscultation. Respiratory effort normal. ?Cardiovascular system: S1 & S2 heard, RRR. No pedal edema. ?Gastrointestinal system: Abdomen is nondistended, soft and nontender. Normal bowel sounds heard. ?Central nervous system: Alert and oriented. No focal neurological deficits. ?Extremities: Symmetric 5 x 5 power. ?Skin: No rashes, lesions or ulcers ?Psychiatry: Mood & affect appropriate.  ? ? ? ?Data Reviewed:  I have personally reviewed following labs and imaging studies ? ? ?CBC ?Lab Results  ?Component Value Date  ? WBC 4.4 01/20/2022  ? RBC 3.85 (L) 01/20/2022  ? HGB 12.6 (L) 01/20/2022  ? HCT 35.8 (L) 01/20/2022  ? MCV 93.0 01/20/2022  ? MCH 32.7 01/20/2022  ? PLT 181 01/20/2022  ? MCHC 35.2 01/20/2022  ? RDW 14.4 01/20/2022  ? LYMPHSABS 1.2 01/19/2022  ? MONOABS 0.8 01/19/2022  ? EOSABS 0.0 01/19/2022  ? BASOSABS 0.0 01/19/2022  ? ? ? ?Last metabolic panel ?Lab Results  ?Component Value Date  ? NA 125 (L) 01/20/2022  ? K 3.9 01/20/2022  ? CL 92 (L) 01/20/2022  ? CO2 23 01/20/2022  ? BUN 12 01/20/2022  ? CREATININE 0.88 01/20/2022  ? GLUCOSE 134 (H) 01/20/2022  ? GFRNONAA >60 01/20/2022  ? GFRAA >60 03/30/2020  ? CALCIUM 8.6 (L) 01/20/2022  ? PHOS 3.8 01/19/2022  ? PROT 6.0 (L) 01/19/2022  ? ALBUMIN 3.6 01/19/2022  ? BILITOT 1.4 (H) 01/19/2022  ? ALKPHOS 37 (L) 01/19/2022  ? AST 19 01/19/2022  ? ALT 19 01/19/2022  ? ANIONGAP 10 01/20/2022  ? ? ?CBG (last 3)  ?No results for input(s): GLUCAP in the last 72 hours.  ? ? ?Coagulation Profile: ?No results for input(s): INR, PROTIME in the last 168 hours. ? ? ?Radiology Studies: ?CT Head Wo Contrast ? ?Result Date: 01/19/2022 ?CLINICAL DATA:  Provided history: Facial fracture, follow-up. EXAM: CT HEAD WITHOUT CONTRAST TECHNIQUE: Contiguous axial images were obtained from the base of the skull through the vertex without  intravenous contrast. RADIATION DOSE REDUCTION: This exam was performed according to the departmental dose-optimization program which includes automated exposure control, adjustment of the mA and/or kV according to patient size and/or  use of iterative reconstruction technique. COMPARISON:  Prior head CT 03/25/2020 and earlier. FINDINGS: Mildly motion degraded exam. Brain: Mild generalized parenchymal atrophy. Moderate to advanced patchy and confluent hypoattenuation within the cerebral white matter, nonspecific but compatible with chronic small vessel ischemic disease. There is no acute intracranial hemorrhage. No demarcated cortical infarct. No extra-axial fluid collection. No evidence of an intracranial mass. No midline shift. Vascular: Dolichoectasia a of the basilar artery. No hyperdense vessel. Atherosclerotic calcifications. Skull: Normal. Negative for fracture or focal lesion. Sinuses/Orbits: Visualized orbits show no acute finding. Trace scattered paranasal sinus mucosal thickening at the imaged levels. Other: No evidence of acute maxillofacial fracture at the imaged levels. Redemonstrated chronic fracture deformity of the left zygomatic arch. IMPRESSION: Mildly motion degraded exam. No evidence of acute intracranial abnormality. Moderate to advanced chronic small vessel image changes within the cerebral white matter. No evidence of acute maxillofacial fracture at the imaged levels. However, if there is concern for an acute maxillofacial fracture, a dedicated maxillofacial CT is recommended for further evaluation. Electronically Signed   By: Kellie Simmering D.O.   On: 01/19/2022 19:16  ? ?MR BRAIN W WO CONTRAST ? ?Result Date: 01/20/2022 ?CLINICAL DATA:  Ataxia EXAM: MRI HEAD WITHOUT AND WITH CONTRAST TECHNIQUE: Multiplanar, multiecho pulse sequences of the brain and surrounding structures were obtained without and with intravenous contrast. CONTRAST:  10m GADAVIST GADOBUTROL 1 MMOL/ML IV SOLN COMPARISON:   None. FINDINGS: Brain: There is a small focus of subacute ischemia within the left frontal white matter. No acute or chronic hemorrhage. There is multifocal hyperintense T2-weighted signal within the white matter.

## 2022-01-20 NOTE — ED Notes (Signed)
Patient to go to 5N 13 after [facility] [place] ?

## 2022-01-20 NOTE — Progress Notes (Signed)
Pt is going for MRI ?

## 2022-01-20 NOTE — ED Notes (Signed)
Sleeping at present.

## 2022-01-20 NOTE — ED Notes (Signed)
Patient transported to [facility].  

## 2022-01-20 NOTE — Progress Notes (Signed)
[facility] night cross cover note: ? ?In following up on trend of serum sodium level in this patient with hyponatremia, serum sodium level collected at approximately 4:30 AM on 01/20/22 noted to be 126, relative to preceding value of 123 collected at 2030 on 01/19/2022.  This interval increase in serum sodium level by 3 over interval 8 hours appears to be consistent with appropriate rate of correction on existing continuous NS running at 75 cc/h, without evidence of rapid overcorrection.  Additionally, relative to initial serum sodium level of 121 at 1800, there has been an overall increase in serum sodium by 5 over 10-1/2 hours. Will continue current rate of normal saline infusion and ongoing serial BMP checks to further monitor ensuing serum sodium level/trend.  ? ?Additionally, MRI brain showed small focus of subacute ischemia within the left frontal white matter, without hemorrhage or mass effect, and reportedly showed no evidence of acute ischemic changes.  ? ? ? ? ?Babs Bertin, DO ?Hospitalist ? ?

## 2022-01-20 NOTE — ED Notes (Signed)
Patient assisted to bedside commode without issue.  ?

## 2022-01-20 NOTE — Progress Notes (Signed)
Pt arrived to the unit. VS WDL. Pt is AxO X4. Pt is on telemetry. Bed alarm is on. Call bell within reach ?

## 2022-01-20 NOTE — Evaluation (Signed)
Physical Therapy Evaluation ?Patient Details ?Name: Jacob Frank ?MRN: [id number] ?DOB: 20-Dec-1950 ?Today's Date: 01/20/2022 ? ?History of Present Illness ? Pt is a 71 y/o male admitted secondary to worsening weakness and hyponatremia. PMH includes HTN, CHF, a fib, alcohol use and COPD.  ?Clinical Impression ? Pt admitted secondary to problem above with deficits below. Pt requiring min guard A for transfers this session. Reporting increased dizziness so further mobility limited; Pt also with + orthostatics. Anticipate pt will progress well one symptoms improve. Reports he would like to have a cane, but will need to practice with one prior to d/c. Will continue to follow acutely.  ? ?  01/20/22 0747  ?Orthostatic Lying   ?BP- Lying (!) 130/102  ?Orthostatic Sitting  ?BP- Sitting 117/78  ?Pulse- Sitting 97  ?Orthostatic Standing at 0 minutes  ?BP- Standing at 0 minutes 92/82  ?Pulse- Standing at 0 minutes 105  ?    ?   ? ?Recommendations for follow up therapy are one component of a multi-disciplinary discharge planning process, led by the attending physician.  Recommendations may be updated based on patient status, additional functional criteria and insurance authorization. ? ?Follow Up Recommendations Home health PT (pending progression) ? ?  ?Assistance Recommended at Discharge Intermittent Supervision/Assistance  ?Patient can return home with the following ? Assistance with cooking/housework;Help with stairs or ramp for entrance;Assist for transportation ? ?  ?Equipment Recommendations Other (comment) (TBD; pt requesting cane, but will need to practice)  ?Recommendations for Other Services ?    ?  ?Functional Status Assessment Patient has had a recent decline in their functional status and demonstrates the ability to make significant improvements in function in a reasonable and predictable amount of time.  ? ?  ?Precautions / Restrictions Precautions ?Precautions: Fall ?Precaution Comments: 5 falls within the past two  weeks ?Restrictions ?Weight Bearing Restrictions: No  ? ?  ? ?Mobility ? Bed Mobility ?Overal bed mobility: Needs Assistance ?Bed Mobility: Supine to Sit, Sit to Supine ?  ?  ?Supine to sit: Supervision ?Sit to supine: Supervision ?  ?General bed mobility comments: Supervision for safety. ?  ? ?Transfers ?Overall transfer level: Needs assistance ?Equipment used: None ?Transfers: Sit to/from Stand ?Sit to Stand: Min guard ?  ?  ?  ?  ?  ?General transfer comment: Min guard for safety. Pt with worsening dizziness and demonstrated drop in BP so further mobility deferred. ?  ? ?Ambulation/Gait ?  ?  ?  ?  ?  ?  ?  ?  ? ?Stairs ?  ?  ?  ?  ?  ? ?Wheelchair Mobility ?  ? ?Modified Rankin (Stroke Patients Only) ?  ? ?  ? ?Balance Overall balance assessment: Needs assistance ?Sitting-balance support: No upper extremity supported ?Sitting balance-Leahy Scale: Fair ?  ?  ?Standing balance support: Single extremity supported ?Standing balance-Leahy Scale: Fair ?  ?  ?  ?  ?  ?  ?  ?  ?  ?  ?  ?  ?   ? ? ? ?Pertinent Vitals/Pain Pain Assessment ?Pain Assessment: No/denies pain  ? ? ?Home Living Family/patient expects to be discharged to:: Other (Comment) (boarding house) ?Living Arrangements: Non-relatives/Friends ?Available Help at Discharge: Other (Comment) (reports no one) ?Type of Home: House ?Home Access: Stairs to enter ?Entrance Stairs-Rails: Right ?Entrance Stairs-Number of Steps: 3-4 ?Alternate Level Stairs-Number of Steps: flight ?Home Layout: Two level ?Home Equipment: None ?   ?  ?Prior Function Prior Level of Function :  Independent/Modified Independent ?  ?  ?  ?  ?  ?  ?  ?  ?  ? ? ?Hand Dominance  ?   ? ?  ?Extremity/Trunk Assessment  ? Upper Extremity Assessment ?Upper Extremity Assessment: Defer to OT evaluation ?  ? ?Lower Extremity Assessment ?Lower Extremity Assessment: Generalized weakness ?  ? ?Cervical / Trunk Assessment ?Cervical / Trunk Assessment: Kyphotic  ?Communication  ? Communication: No  difficulties  ?Cognition Arousal/Alertness: Awake/alert ?Behavior During Therapy: [facility] for tasks assessed/performed ?Overall Cognitive Status: No family/caregiver present to determine baseline cognitive functioning ?  ?  ?  ?  ?  ?  ?  ?  ?  ?  ?  ?  ?  ?  ?  ?  ?  ?  ?  ? ?  ?General Comments   ? ?  ?Exercises    ? ?Assessment/Plan  ?  ?PT Assessment Patient needs continued PT services  ?PT Problem List Decreased strength;Decreased activity tolerance;Decreased balance;Decreased mobility;Decreased knowledge of use of DME;Decreased knowledge of precautions ? ?   ?  ?PT Treatment Interventions DME instruction;Gait training;Functional mobility training;Stair training;Therapeutic activities;Therapeutic exercise;Balance training;Patient/family education   ? ?PT Goals (Current goals can be found in the Care Plan section)  ?Acute Rehab PT Goals ?Patient Stated Goal: to decrease dizziness ?PT Goal Formulation: With patient ?Time For Goal Achievement: 02/03/22 ?Potential to Achieve Goals: Good ? ?  ?Frequency Min 3X/week ?  ? ? ?Co-evaluation   ?  ?  ?  ?  ? ? ?  ?AM-PAC PT "6 Clicks" Mobility  ?Outcome Measure Help needed turning from your back to your side while in a flat bed without using bedrails?: None ?Help needed moving from lying on your back to sitting on the side of a flat bed without using bedrails?: None ?Help needed moving to and from a bed to a chair (including a wheelchair)?: A Little ?Help needed standing up from a chair using your arms (e.g., wheelchair or bedside chair)?: A Little ?Help needed to walk in hospital room?: A Little ?Help needed climbing 3-5 steps with a railing? : A Little ?6 Click Score: 20 ? ?  ?End of Session Equipment Utilized During Treatment: Gait belt ?Activity Tolerance: Treatment limited secondary to medical complications (Comment) (dizziness, +orthostatics) ?Patient left: in bed;with call bell/phone within reach (on stretcher in ED) ?Nurse Communication: Mobility status ?PT Visit  Diagnosis: Unsteadiness on feet (R26.81);Muscle weakness (generalized) (M62.81);Difficulty in walking, not elsewhere classified (R26.2);Dizziness and giddiness (R42) ?  ? ?Time: 7544-9201 ?PT Time Calculation (min) (ACUTE ONLY): 12 min ? ? ?Charges:   PT Evaluation ?$PT Eval Moderate Complexity: 1 Mod ?  ?  ?   ? ? ?Reuel Derby, PT, DPT  ?Acute Rehabilitation Services  ?Pager: [phone] ?Office: [phone] ? ? ?[place] ?01/20/2022, 9:38 AM ?
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
## 2022-01-21 ENCOUNTER — Inpatient Hospital Stay (HOSPITAL_COMMUNITY): Payer: Medicare Other

## 2022-01-21 DIAGNOSIS — I6330 Cerebral infarction due to thrombosis of unspecified cerebral artery: Secondary | ICD-10-CM | POA: Insufficient documentation

## 2022-01-21 DIAGNOSIS — R531 Weakness: Secondary | ICD-10-CM | POA: Diagnosis not present

## 2022-01-21 DIAGNOSIS — I482 Chronic atrial fibrillation, unspecified: Secondary | ICD-10-CM | POA: Diagnosis not present

## 2022-01-21 DIAGNOSIS — I6389 Other cerebral infarction: Secondary | ICD-10-CM

## 2022-01-21 DIAGNOSIS — F1010 Alcohol abuse, uncomplicated: Secondary | ICD-10-CM | POA: Diagnosis not present

## 2022-01-21 DIAGNOSIS — J449 Chronic obstructive pulmonary disease, unspecified: Secondary | ICD-10-CM | POA: Diagnosis not present

## 2022-01-21 LAB — CBC WITH DIFFERENTIAL/PLATELET
Abs Immature Granulocytes: 0.03 10*3/uL (ref 0.00–0.07)
Basophils Absolute: 0 10*3/uL (ref 0.0–0.1)
Basophils Relative: 1 %
Eosinophils Absolute: 0.1 10*3/uL (ref 0.0–0.5)
Eosinophils Relative: 1 %
HCT: 37.5 % — ABNORMAL LOW (ref 39.0–52.0)
Hemoglobin: 12.7 g/dL — ABNORMAL LOW (ref 13.0–17.0)
Immature Granulocytes: 1 %
Lymphocytes Relative: 25 %
Lymphs Abs: 1.2 10*3/uL (ref 0.7–4.0)
MCH: 32.2 pg (ref 26.0–34.0)
MCHC: 33.9 g/dL (ref 30.0–36.0)
MCV: 94.9 fL (ref 80.0–100.0)
Monocytes Absolute: 0.6 10*3/uL (ref 0.1–1.0)
Monocytes Relative: 13 %
Neutro Abs: 3 10*3/uL (ref 1.7–7.7)
Neutrophils Relative %: 59 %
Platelets: 175 10*3/uL (ref 150–400)
RBC: 3.95 MIL/uL — ABNORMAL LOW (ref 4.22–5.81)
RDW: 14.4 % (ref 11.5–15.5)
WBC: 4.9 10*3/uL (ref 4.0–10.5)
nRBC: 0 % (ref 0.0–0.2)

## 2022-01-21 LAB — BASIC METABOLIC PANEL
Anion gap: 9 (ref 5–15)
BUN: 8 mg/dL (ref 8–23)
CO2: 25 mmol/L (ref 22–32)
Calcium: 9 mg/dL (ref 8.9–10.3)
Chloride: 95 mmol/L — ABNORMAL LOW (ref 98–111)
Creatinine, Ser: 0.85 mg/dL (ref 0.61–1.24)
GFR, Estimated: >60 mL/min (ref >60)
Glucose, Bld: 93 mg/dL (ref 70–99)
Potassium: 3.9 mmol/L (ref 3.5–5.1)
Sodium: 129 mmol/L — ABNORMAL LOW (ref 135–145)

## 2022-01-21 LAB — ECHOCARDIOGRAM COMPLETE
AR max vel: 1.91 cm2
AV Peak grad: 10.2 mmHg
Ao pk vel: 1.6 m/s
Area-P 1/2: 1.42 cm2
Height: 69 in
S' Lateral: 2.5 cm
Weight: 3328 oz

## 2022-01-21 LAB — LIPID PANEL
Cholesterol: 151 mg/dL (ref 0–200)
HDL: 82 mg/dL (ref >40)
LDL Cholesterol: 61 mg/dL (ref 0–99)
Total CHOL/HDL Ratio: 1.8 RATIO
Triglycerides: 42 mg/dL (ref <150)
VLDL: 8 mg/dL (ref 0–40)

## 2022-01-21 LAB — C-REACTIVE PROTEIN: CRP: 1 mg/dL — ABNORMAL HIGH (ref <1.0)

## 2022-01-21 MED ORDER — METOPROLOL SUCCINATE ER 50 MG PO TB24
50.0000 mg | ORAL_TABLET | Freq: Every day | ORAL | Status: DC
Start: 2022-01-22 — End: 2022-01-22
  Administered 2022-01-22: 50 mg via ORAL
  Filled 2022-01-21: qty 1

## 2022-01-21 MED ORDER — BUPROPION HCL ER (XL) 150 MG PO TB24
300.0000 mg | ORAL_TABLET | Freq: Every day | ORAL | Status: DC
  Administered 2022-01-21 – 2022-01-26 (×6): 300 mg via ORAL
  Filled 2022-01-21 (×6): qty 2

## 2022-01-21 MED ORDER — THIAMINE HCL 100 MG PO TABS
100.0000 mg | ORAL_TABLET | Freq: Every day | ORAL | Status: DC
  Administered 2022-01-22 – 2022-01-26 (×5): 100 mg via ORAL
  Filled 2022-01-21 (×5): qty 1

## 2022-01-21 MED ORDER — SODIUM CHLORIDE 0.9 % IV SOLN: INTRAVENOUS | Status: DC

## 2022-01-21 MED ORDER — ENSURE ENLIVE PO LIQD
237.0000 mL | Freq: Three times a day (TID) | ORAL | Status: DC
  Administered 2022-01-21 – 2022-01-25 (×13): 237 mL via ORAL

## 2022-01-21 MED ORDER — QUETIAPINE FUMARATE 100 MG PO TABS
400.0000 mg | ORAL_TABLET | Freq: Two times a day (BID) | ORAL | Status: DC
  Administered 2022-01-21 – 2022-01-26 (×11): 400 mg via ORAL
  Filled 2022-01-21 (×11): qty 4
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
## 2022-01-21 NOTE — Progress Notes (Signed)
Initial Nutrition Assessment ? ?DOCUMENTATION CODES:  ? ?Not applicable ? ?INTERVENTION:  ? ?Continue Regular diet ? ?Ensure Enlive po TID, each supplement provides 350 kcal and 20 grams of protein. ? ?Continue MVI with Minerals, Thiamine, Folic acid ? ?Checking vitamin labs including Vitamin C, Copper, Vitamin A, Vitamin D, Vitamin E, Vitamin B6. Folate and Thiamine already being supplemented. B12 wdl.  ?Plan to check CRP as well ? ?NUTRITION DIAGNOSIS:  ? ?Inadequate oral intake related to poor appetite (substance abuse) as evidenced by per patient/family report. ? ?GOAL:  ? ?Patient will meet greater than or equal to 90% of their needs ? ?MONITOR:  ? ?PO intake, Supplement acceptance, Labs, Weight trends ? ?REASON FOR ASSESSMENT:  ? ?Consult ?Assessment of nutrition requirement/status ? ?ASSESSMENT:  ? ?71 yo male admitted with at least 2 weeks of increasing weakness, now needing assistance even to walk, dizziness and ataxia secondary to hyponatremia and subacute ischemia within the left frontal white matter  PMH includes EtOH abuse, hepatic cirrhosis, hyponatremia CHF, COPD, cancer, depression with SI, reported hx of B12 def, tobacco dependence ? ?Pt is on CIWA protocol ? ?Pt has experienced decreased po intake for at least several weeks per PCP. Pt has been living in a boarding house recently ? ?Pt currently on Regular diet, no recorded po intake. Unable to obtain diet and weight history from patient at this time ? ?Per MD notes, pt drinks 2 beers with breakfast, 2 beers with lunch and the remainder in the evening (12-15 per day). Pt has been drinking alcohol for as long as he can remember. Pt also with daily tobacco use including smoking cigarettes and using snuff. ? ?Vitamin D 15.9 (L) in June 2021 but has not been rechecked.  ?B12 checked 01/19/22 and level is 306 (wdl) ?No CRP ? ?Pt is at a risk for a variety of micronutrient deficeincies given hx of long standing significant EtOH abuse, tobacco abuse, poor  po intake, Cirrhosis. Plan to check appropriate labs.  ? ?Home meds indicate pt taking B12, folate and thiamine supplements at home.  ? ?Recent weight is up but trend over time has been relatively stable with fluctuations up and down.  ? ?Labs: sodium 129 (L) ?Meds: MVI with minerals, thiamine, folic acid ? ? ?NUTRITION - FOCUSED PHYSICAL EXAM: ? ?Unable to assess ? ?Diet Order:   ?Diet Order   ? ?       ?  Diet regular Room service appropriate? Yes; Fluid consistency: Thin  Diet effective now       ?  ? ?  ?  ? ?  ? ? ?EDUCATION NEEDS:  ? ?Not appropriate for education at this time ? ?Skin:  Skin Assessment: Reviewed RN Assessment ? ?Last BM:  4/22 ? ?Height:  ? ?Ht Readings from Last 1 Encounters:  ?01/19/22 '5\' 9"'$  (1.753 m)  ? ? ?Weight:  ? ?Wt Readings from Last 1 Encounters:  ?01/19/22 94.3 kg  ? ? ? ?BMI:  Body mass index is 30.72 kg/m?. ? ?Estimated Nutritional Needs:  ? ?Kcal:  2000-2200 kcals ? ?Protein:  100-115 g ? ?Fluid:  >/= 2 L ? ?Kerman Passey MS, RDN, LDN, CNSC ?Registered Dietitian III ?Clinical Nutrition ?RD Pager and On-Call Pager Number Located in [place]  ? ?

## 2022-01-21 NOTE — Evaluation (Signed)
Occupational Therapy Evaluation ?Patient Details ?Name: Jacob Frank ?MRN: [id number] ?DOB: [birth year] ?Today's Date: 01/21/2022 ? ? ?History of Present Illness Pt is a 71 y/o male admitted secondary to worsening weakness and hyponatremia. PMH includes HTN, CHF, a fib, alcohol use and COPD.  ? ?Clinical Impression ?  ?Pt reports independence at baseline with mobility and ADLs, although reports not changing his clothes for a few weeks. Rents a room in a boarding house, but does not have assist from others at d/c. Pt min guard-min A for ADLs during session, min guard for step pivot transfer and supervision for bed mobility. Pt with +orthostatic with chair <> bed transfer, further mobility deferred at this time. Pt symptomatic reporting double vision and feeling "swimmy headed".  Pt presenting with impairments listed below, will follow acutely. Recommend HHOT at d/c pending pt progress. ? ?BP EOB pre-transfer: 136/105 ?BP standing ~3 min: 75/65 ?BP supine post-transfer: 118/86 ?   ? ?Recommendations for follow up therapy are one component of a multi-disciplinary discharge planning process, led by the attending physician.  Recommendations may be updated based on patient status, additional functional criteria and insurance authorization.  ? ?Follow Up Recommendations ? Home health OT  ?  ?Assistance Recommended at Discharge Set up Supervision/Assistance  ?Patient can return home with the following A little help with walking and/or transfers;A little help with bathing/dressing/bathroom;Assistance with cooking/housework;Help with stairs or ramp for entrance;Assist for transportation ? ?  ?Functional Status Assessment ? Patient has had a recent decline in their functional status and demonstrates the ability to make significant improvements in function in a reasonable and predictable amount of time.  ?Equipment Recommendations ? BSC/3in1 (as shower seat)  ?  ?Recommendations for Other Services   ? ? ?  ?Precautions /  Restrictions Precautions ?Precautions: Fall;Other (comment) (watch BP) ?Precaution Comments: 5 falls within the past two weeks, reports feeling lightheaded and then lowering self to the ground ?Restrictions ?Weight Bearing Restrictions: No  ? ?  ? ?Mobility Bed Mobility ?Overal bed mobility: Needs Assistance ?Bed Mobility: Sit to Supine ?  ?  ?  ?Sit to supine: Supervision ?  ?General bed mobility comments: sitting EOB upon arrival ?  ? ?Transfers ?Overall transfer level: Needs assistance ?Equipment used: Rolling walker (2 wheels) ?Transfers: Sit to/from Stand, Bed to chair/wheelchair/BSC ?Sit to Stand: Min guard ?  ?  ?Step pivot transfers: Min guard ?  ?  ?General transfer comment: pt symptomatic, reporting double vision with standing for extended period of time ?  ? ?  ?Balance Overall balance assessment: Needs assistance ?Sitting-balance support: No upper extremity supported ?Sitting balance-Leahy Scale: Fair ?Sitting balance - Comments: sits EOB to eat breakfast without LOB ?  ?Standing balance support: Single extremity supported ?Standing balance-Leahy Scale: Fair ?Standing balance comment: sways anterior/posterior with static standing ?  ?  ?  ?  ?  ?  ?  ?  ?  ?  ?  ?   ? ?ADL either performed or assessed with clinical judgement  ? ?ADL Overall ADL's : Needs assistance/impaired ?Eating/Feeding: Set up;Sitting ?  ?Grooming: Wash/dry hands;Wash/dry face;Sitting ?Grooming Details (indicate cue type and reason): completes sitting EOB ?Upper Body Bathing: Minimal assistance;Sitting ?  ?Lower Body Bathing: Minimal assistance;Sitting/lateral leans ?  ?Upper Body Dressing : Minimal assistance;Sitting ?  ?Lower Body Dressing: Minimal assistance;Sitting/lateral leans ?  ?Toilet Transfer: Min guard;BSC/3in1;Stand-pivot;Rolling walker (2 wheels) ?Toilet Transfer Details (indicate cue type and reason): simulated bed <>chair ?Toileting- Clothing Manipulation and Hygiene: Supervision/safety ?  ?  ?  ?  Functional mobility  during ADLs: Min guard;Rolling walker (2 wheels) ?   ? ? ? ?Vision   ?Vision Assessment?: No apparent visual deficits ?Additional Comments: reports wearing glasses for reading only  ?   ?Perception   ?  ?Praxis   ?  ? ?Pertinent Vitals/Pain Pain Assessment ?Pain Assessment: Faces ?Pain Score: 2  ?Faces Pain Scale: Hurts a little bit ?Pain Location: back  ? ? ? ?Hand Dominance   ?  ?Extremity/Trunk Assessment Upper Extremity Assessment ?Upper Extremity Assessment: Generalized weakness ?  ?Lower Extremity Assessment ?Lower Extremity Assessment: Defer to PT evaluation ?  ?Cervical / Trunk Assessment ?Cervical / Trunk Assessment: Kyphotic ?  ?Communication Communication ?Communication: No difficulties ?  ?Cognition Arousal/Alertness: Awake/alert ?Behavior During Therapy: [facility] for tasks assessed/performed ?Overall Cognitive Status: No family/caregiver present to determine baseline cognitive functioning ?  ?  ?  ?  ?  ?  ?  ?  ?  ?  ?  ?  ?  ?  ?  ?  ?General Comments: a & o x4 however, increased response time ?  ?  ?General Comments  seated BP 136/105, standing BP after 3 mins 75/65, supine BP 118/86 ? ?  ?Exercises   ?  ?Shoulder Instructions    ? ? ?Home Living Family/patient expects to be discharged to:: Other (Comment) ?Living Arrangements: Non-relatives/Friends (rents room in boarding house) ?Available Help at Discharge: Other (Comment) (none) ?Type of Home: House ?Home Access: Stairs to enter ?Entrance Stairs-Number of Steps: 3-4, then an entire flight to top floor ?Entrance Stairs-Rails: Right ?Home Layout: Two level ?Alternate Level Stairs-Number of Steps: flight ?Alternate Level Stairs-Rails: None ?Bathroom Shower/Tub: Walk-in shower ?  ?Bathroom Toilet: Standard ?  ?  ?Home Equipment: None ?  ?  ?  ? ?  ?Prior Functioning/Environment Prior Level of Function : Independent/Modified Independent ?  ?  ?  ?  ?  ?  ?Mobility Comments: reports furniture walking ?ADLs Comments: reports not changin clothes in 2 weeks ?   ? ?  ?  ?OT Problem List: Decreased strength;Decreased activity tolerance;Decreased range of motion;Impaired balance (sitting and/or standing);Decreased safety awareness;Decreased cognition;Decreased knowledge of use of DME or AE ?  ?   ?OT Treatment/Interventions: Self-care/ADL training;Therapeutic exercise;DME and/or AE instruction;Therapeutic activities;Patient/family education;Balance training  ?  ?OT Goals(Current goals can be found in the care plan section) Acute Rehab OT Goals ?Patient Stated Goal: none stated ?OT Goal Formulation: With patient ?Time For Goal Achievement: 02/04/22 ?Potential to Achieve Goals: Good ?ADL Goals ?Pt Will Perform Upper Body Dressing: with modified independence;sitting;standing ?Pt Will Perform Lower Body Dressing: with modified independence;sitting/lateral leans;sit to/from stand ?Pt Will Transfer to Toilet: with modified independence;ambulating;regular height toilet ?Pt Will Perform Tub/Shower Transfer: Shower transfer;Tub transfer;ambulating;3 in 1;with modified independence  ?OT Frequency: Min 2X/week ?  ? ?Co-evaluation   ?  ?  ?  ?  ? ?  ?AM-PAC OT "6 Clicks" Daily Activity     ?Outcome Measure Help from another person eating meals?: None ?Help from another person taking care of personal grooming?: A Little ?Help from another person toileting, which includes using toliet, bedpan, or urinal?: A Little ?Help from another person bathing (including washing, rinsing, drying)?: A Little ?Help from another person to put on and taking off regular upper body clothing?: A Little ?Help from another person to put on and taking off regular lower body clothing?: A Little ?6 Click Score: 19 ?  ?End of Session Equipment Utilized During Treatment: Rolling walker (2 wheels);Gait belt ?Nurse  Communication: Mobility status ? ?Activity Tolerance: Patient tolerated treatment well ?Patient left:   ? ?OT Visit Diagnosis: Unsteadiness on feet (R26.81);Other abnormalities of gait and mobility  (R26.89);Muscle weakness (generalized) (M62.81)  ?              ?Time: 1010-1044 ?OT Time Calculation (min): 34 min ?Charges:  OT General Charges ?$OT Visit: 1 Visit ?OT Evaluation ?$OT Eval Moderate Complexity: 1 Mod ?OT

## 2022-01-21 NOTE — Progress Notes (Signed)
Pt refused to put on tele. Pt educated on the importance of tele monitoring. On call MD is notified and tele dept is aware as well.  ?

## 2022-01-21 NOTE — Progress Notes (Addendum)
STROKE TEAM PROGRESS NOTE  ? ?INTERVAL HISTORY ?Patient seen in room, no family at the bedside.  Patient states that he has had numerous falls over the last couple months because he feels like his legs just give out and he gets dizzy.  He states this does not have anything to do with him drinking alcohol, however he does endorse drinking 2 beers with breakfast, 2 beers with lunch and then "knocking the rest back with dinner."  He also states he is been drinking for as long as he can remember.  Neurological exam is nonfocal, hemodynamically stable.  Patient does have some intermittent confusion related to his past medical history. ?MRI scan of the brain shows tiny left frontal periventricular white matter hyperintensity on DWI which is probably an artifact.  There is no corresponding dark signal on the ADC map.  MR angiogram of the brain and neck are both unremarkable.  Carotid ultrasound showed no significant stenosis.  Patient has known history of atrial fibrillation but has not been on anticoagulation due to his history of heavy alcohol use. ?Vitals:  ? 01/20/22 1523 01/20/22 1925 01/20/22 2349 01/21/22 0738  ?BP: 107/83 128/88 109/86 123/74  ?Pulse: 82 74 84 82  ?Resp: '20 20 20 17  '$ ?Temp: 98.3 ?F (36.8 ?C) 98.5 ?F (36.9 ?C)  98.7 ?F (37.1 ?C)  ?TempSrc: Oral Oral  Oral  ?SpO2: 90% 91%  90%  ?Weight:      ?Height:      ? ?CBC:  ?Recent Labs  ?Lab 01/19/22 ?1800 01/20/22 ?0420 01/21/22 ?0128  ?WBC 5.8 4.4 4.9  ?NEUTROABS 3.7  --  3.0  ?HGB 13.1 12.6* 12.7*  ?HCT 38.0* 35.8* 37.5*  ?MCV 93.4 93.0 94.9  ?PLT 191 181 175  ? ?Basic Metabolic Panel:  ?Recent Labs  ?Lab 01/19/22 ?1803 01/19/22 ?2033 01/20/22 ?0420 01/20/22 ?1116 01/21/22 ?0128  ?NA  --  123*   < > 125* 129*  ?K  --  3.7   < > 3.9 3.9  ?CL  --  90*   < > 92* 95*  ?CO2  --  22   < > 23 25  ?GLUCOSE  --  91   < > 134* 93  ?BUN  --  15   < > 12 8  ?CREATININE  --  1.06   < > 0.88 0.85  ?CALCIUM  --  8.5*   < > 8.6* 9.0  ?MG 2.0  --   --   --   --   ?PHOS  --   3.8  --   --   --   ? < > = values in this interval not displayed.  ? ?Lipid Panel:  ?Recent Labs  ?Lab 01/21/22 ?0128  ?CHOL 151  ?TRIG 42  ?HDL 82  ?CHOLHDL 1.8  ?VLDL 8  ?[place] 61  ? ?HgbA1c:  ?Recent Labs  ?Lab 01/20/22 ?1341  ?HGBA1C 4.9  ? ?Urine Drug Screen:  ?Recent Labs  ?Lab 01/19/22 ?2041  ?LABOPIA NONE DETECTED  ?COCAINSCRNUR NONE DETECTED  ?LABBENZ NONE DETECTED  ?AMPHETMU NONE DETECTED  ?THCU NONE DETECTED  ?LABBARB NONE DETECTED  ?  ?Alcohol Level  ?Recent Labs  ?Lab 01/19/22 ?1800  ?ETH <10  ? ? ?IMAGING past 24 hours ?MR ANGIO HEAD WO CONTRAST ? ?Result Date: 01/20/2022 ?CLINICAL DATA:  Stroke follow-up EXAM: MRA NECK WITHOUT CONTRAST MRA HEAD WITHOUT CONTRAST TECHNIQUE: Angiographic images of the Circle of [place] were acquired using MRA technique without intravenous contrast. COMPARISON:  None. FINDINGS:  MRA NECK FINDINGS No occlusion or stenosis of the carotid or vertebral arteries based on time-of-flight imaging of the neck. Vertebral system is codominant. MRA HEAD FINDINGS POSTERIOR CIRCULATION: --Vertebral arteries: Normal --Inferior cerebellar arteries: Normal. --Basilar artery: Normal. --Superior cerebellar arteries: Normal. --Posterior cerebral arteries: Normal. ANTERIOR CIRCULATION: --Intracranial internal carotid arteries: Normal. --Anterior cerebral arteries (ACA): Normal. --Middle cerebral arteries (MCA): Normal. ANATOMIC VARIANTS: None IMPRESSION: Normal MRA of the head and neck. Electronically Signed   By: Ulyses Jarred M.D.   On: 01/20/2022 20:16  ? ?MR ANGIO NECK WO CONTRAST ? ?Result Date: 01/20/2022 ?CLINICAL DATA:  Stroke follow-up EXAM: MRA NECK WITHOUT CONTRAST MRA HEAD WITHOUT CONTRAST TECHNIQUE: Angiographic images of the Circle of [place] were acquired using MRA technique without intravenous contrast. COMPARISON:  None. FINDINGS: MRA NECK FINDINGS No occlusion or stenosis of the carotid or vertebral arteries based on time-of-flight imaging of the neck. Vertebral system is  codominant. MRA HEAD FINDINGS POSTERIOR CIRCULATION: --Vertebral arteries: Normal --Inferior cerebellar arteries: Normal. --Basilar artery: Normal. --Superior cerebellar arteries: Normal. --Posterior cerebral arteries: Normal. ANTERIOR CIRCULATION: --Intracranial internal carotid arteries: Normal. --Anterior cerebral arteries (ACA): Normal. --Middle cerebral arteries (MCA): Normal. ANATOMIC VARIANTS: None IMPRESSION: Normal MRA of the head and neck. Electronically Signed   By: Ulyses Jarred M.D.   On: 01/20/2022 20:16  ? ?VAS US CAROTID ? ?Result Date: 01/20/2022 ?Carotid Arterial Duplex Study Patient Name:  YOBANY VROOM  Date of Exam:   01/20/2022 Medical Rec #: [id number]    Accession #:    [id number] Date of Birth: 07/24/1951    Patient Gender: M Patient Age:   71 years Exam Location:  [facility] Procedure:      VAS US CAROTID Referring Phys: Hosie Poisson --------------------------------------------------------------------------------  Indications:       CVA. Risk Factors:      Hypertension. Comparison Study:  No prior study Performing Technologist: Maudry Mayhew MHA, RDMS, RVT, RDCS  Examination Guidelines: A complete evaluation includes B-mode imaging, spectral Doppler, color Doppler, and power Doppler as needed of all accessible portions of each vessel. Bilateral testing is considered an integral part of a complete examination. Limited examinations for reoccurring indications may be performed as noted.  Right Carotid Findings: +----------+--------+--------+--------+------------------+--------+           PSV cm/sEDV cm/sStenosisPlaque DescriptionComments +----------+--------+--------+--------+------------------+--------+ CCA Prox  45      11                                         +----------+--------+--------+--------+------------------+--------+ CCA Distal35      11                                         +----------+--------+--------+--------+------------------+--------+ ICA  Prox  27      11                                         +----------+--------+--------+--------+------------------+--------+ ICA Distal33      17                                         +----------+--------+--------+--------+------------------+--------+ ECA  61      12                                         +----------+--------+--------+--------+------------------+--------+ +----------+--------+-------+----------------+-------------------+           PSV cm/sEDV cmsDescribe        Arm Pressure (mmHG) +----------+--------+-------+----------------+-------------------+ [id number]             Multiphasic, WNL                    +----------+--------+-------+----------------+-------------------+ +---------+--------+--+--------+--+---------+ VertebralPSV cm/s31EDV cm/s11Antegrade +---------+--------+--+--------+--+---------+  Left Carotid Findings: +----------+--------+--------+--------+-----------------------+--------+           PSV cm/sEDV cm/sStenosisPlaque Description     Comments +----------+--------+--------+--------+-----------------------+--------+ CCA Prox  50      12                                              +----------+--------+--------+--------+-----------------------+--------+ CCA Distal38      15                                              +----------+--------+--------+--------+-----------------------+--------+ ICA Prox  40      19                                              +----------+--------+--------+--------+-----------------------+--------+ ICA Distal51      19                                              +----------+--------+--------+--------+-----------------------+--------+ ECA       35      8               smooth and heterogenous         +----------+--------+--------+--------+-----------------------+--------+ +----------+--------+--------+----------------+-------------------+           PSV cm/sEDV  cm/sDescribe        Arm Pressure (mmHG) +----------+--------+--------+----------------+-------------------+ [id number]              Multiphasic, WNL                    +----------+--------+--------+----------------+-------------------+ +---------+--------+--+--------+--+---------+ VertebralPSV cm/s29EDV cm/s12Ante

## 2022-01-21 NOTE — Progress Notes (Signed)
PT Cancellation Note ? ?Patient Details ?Name: Jacob Frank ?MRN: [id number] ?DOB: [birth year] ? ? ?Cancelled Treatment:    Reason Eval/Treat Not Completed: Patient declined, no reason specified ?New order acknowledge; patient already on physical therapy caseload. Attempted to work with Mr. Breshears this afternoon however he declines to be seen at this time. Will follow-up tomorrow. ? ?Orthostatic hypotension noted from OT assessment this morning. ? ?[name] ?01/21/2022, 4:25 PM ?

## 2022-01-21 NOTE — Progress Notes (Signed)
? ? ? [facility] ?                                                                            ? ? ?Jacob Frank, is a 71 y.o. male, DOB - 10-Jan-1951, QHU:[id number] ?Admit date - 01/19/2022    ?Outpatient Primary MD for the patient is McGowen, Adrian Blackwater, MD ? ?LOS - 1  days ? ? ? ?Brief summary  ? ?Jacob Frank is a 71 y.o. male with medical history significant of alcohol use, atrial fibrillation, COPD, hypertension, B12 deficiency, anxiety, BPH presenting with progressive weakness and dizziness. ? ?Patient has had at least 2 weeks of increasing weakness now needing assistance even to walk.  Of note he has had decreased p.o. intake for at least several weeks per PCP note.  He currently lives in a boardinghouse and has been able to be compliant with his medications. ?He was found to have hyponatremia. CT head without any abnormality.  ?  ?Assessment & Plan  ? ? ?Assessment and Plan: ? ?Generalized weakness with dizziness and ataxia secondary to Hyponatremia and subacute ischemia within the left frontal white matter.  ?In the setting of chronic alcohol use and decreased  oral intake for the last few weeks.  ?- probably secondary to hyponatremia, improving gradually.  ?- neurology consulted for evaluation of acute stroke.  Mri findings of the sub acute ischemia probably artifact or incidental, not related to his symptoms.  ?- start the patient on aspirin 325 mg daily.  ?- lipid panel reviewed  LDl IS 61, hemoglobin A1c IS 4.9%, echocardiogram and carotid duplex ordered.  ?Carotid duplex, MRA head and neck unremarkable.  ?Echocardiogram showed Left ventricular ejection fraction is 55 to 60%. The  left ventricle has normal function,  no regional wall motion abnormalities. Left ventricular diastolic parameters are consistent with Grade I diastolic dysfunction (impaired relaxation).  ?- Therapy evaluations ordered and recommended home health PT, OT.  ?- UDS is negative.  ? ? ?Paroxysmal  atrial fibrillation.  ?Rate  controlled with metoprolol. Not on anti coagulation.  ?Echocardiogram  reviewed.  ? ? ?Hypertension:  ?Bp parameters are optimal.  ? ? ?Chronic alcohol use:  ?Alcohol level is negative.  ?On CIWA For withdrawal symptoms.  ?Continue with thiamine and folic acid.  ? ? ?Acute hyponatremia:  ?Probably secondary to chronic alcohol use.  ?Serum osmo is 254, urine osmo is 322, urine sodium  is less than 10 .  ? ? ? ?Mild anemia  ?Monitor.  ? ?  ?COPD:  ?No wheezing heard.  ? ?Orthostatic hypotension:  ?Probably from autonomic dysfunction in view of alcohol use and deconditioning.  ?Decrease the dose of metoprolol and recommend compression stockings.  ?Recheck orthostatics in am.  ? ? ? ? ?Interventions: Ensure Enlive (each supplement provides 350kcal and 20 grams of protein), MVI, Refer to RD note for recommendations ? ?Estimated body mass index is 30.72 kg/m? as calculated from the following: ?  Height as of this encounter: '5\' 9"'$  (1.753 m). ?  Weight as of this encounter: 94.3 kg. ? ?Code Status: full code.  ?DVT Prophylaxis:  enoxaparin (LOVENOX) injection 40 mg Start: 01/19/22 2045 ? ? ?Level of Care: Level of  care: Telemetry Medical ?Family Communication: None at bedside.  ? ?Disposition Plan:     Remains inpatient appropriate:  hyponatremia, orthostatic hypotension. Probably plan for discharge when orthostatics improve.  ? ?Procedures:  ?MRI brain.  ?Echocardiogram.  ? ?Consultants:   ?Neurology ?Cardiology. ? ?Antimicrobials:  ? ?Anti-infectives (From admission, onward)  ? ? None  ? ?  ? ? ? ?Medications ? ?Scheduled Meds: ?  stroke: early stages of recovery book   Does not apply Once  ? arformoterol  15 mcg Nebulization BID  ? And  ? umeclidinium bromide  1 puff Inhalation Daily  ? aspirin  300 mg Rectal Daily  ? Or  ? aspirin  325 mg Oral Daily  ? buPROPion  300 mg Oral Daily  ? enoxaparin (LOVENOX) injection  40 mg Subcutaneous Q24H  ? feeding supplement  237 mL Oral TID BM  ? folic acid  1 mg Oral Daily  ?  metoprolol succinate  100 mg Oral Daily  ? multivitamin with minerals  1 tablet Oral Daily  ? pantoprazole  40 mg Oral Daily  ? QUEtiapine  400 mg Oral BID  ? sodium chloride flush  3 mL Intravenous Q12H  ? [START ON 01/22/2022] thiamine  100 mg Oral Daily  ? ?Continuous Infusions: ? ? ?PRN Meds:.acetaminophen **OR** acetaminophen, albuterol, LORazepam **OR** LORazepam, polyethylene glycol ? ? ? ?Subjective:  ? ?[name] was seen and examined today.  Wants to take a shower, very symptomatic with dizziness and double vision on standing up and moving around.  ? ?Objective:  ? ?Vitals:  ? 01/20/22 1925 01/20/22 2349 01/21/22 6754 01/21/22 0836  ?BP: 128/88 109/86 123/74   ?Pulse: 74 84 82   ?Resp: '20 20 17   '$ ?Temp: 98.5 ?F (36.9 ?C)  98.7 ?F (37.1 ?C)   ?TempSrc: Oral  Oral   ?SpO2: 91%  90% 90%  ?Weight:      ?Height:      ? ? ?Intake/Output Summary (Last 24 hours) at 01/21/2022 1436 ?Last data filed at 01/21/2022 0745 ?Gross per 24 hour  ?Intake 1081.39 ml  ?Output 1500 ml  ?Net -418.61 ml  ? ? ?Filed Weights  ? 01/19/22 1658  ?Weight: 94.3 kg  ? ? ? ?Exam ?General exam: Appears calm and comfortable  ?Respiratory system: Clear to auscultation. Respiratory effort normal. ?Cardiovascular system: S1 & S2 heard, RRR. No JVD, No pedal edema. ?Gastrointestinal system: Abdomen is nondistended, soft and nontender. Normal bowel sounds heard. ?Central nervous system: Alert and oriented. No focal neurological deficits. ?Extremities: Symmetric 5 x 5 power. ?Skin: No rashes, lesions or ulcers ?Psychiatry:  Mood & affect appropriate.  ? ? ? ? ?Data Reviewed:  I have personally reviewed following labs and imaging studies ? ? ?CBC ?Lab Results  ?Component Value Date  ? WBC 4.9 01/21/2022  ? RBC 3.95 (L) 01/21/2022  ? HGB 12.7 (L) 01/21/2022  ? HCT 37.5 (L) 01/21/2022  ? MCV 94.9 01/21/2022  ? MCH 32.2 01/21/2022  ? PLT 175 01/21/2022  ? MCHC 33.9 01/21/2022  ? RDW 14.4 01/21/2022  ? LYMPHSABS 1.2 01/21/2022  ? MONOABS 0.6 01/21/2022   ? EOSABS 0.1 01/21/2022  ? BASOSABS 0.0 01/21/2022  ? ? ? ?Last metabolic panel ?Lab Results  ?Component Value Date  ? NA 129 (L) 01/21/2022  ? K 3.9 01/21/2022  ? CL 95 (L) 01/21/2022  ? CO2 25 01/21/2022  ? BUN 8 01/21/2022  ? CREATININE 0.85 01/21/2022  ? GLUCOSE 93 01/21/2022  ? GFRNONAA >60  01/21/2022  ? GFRAA >60 03/30/2020  ? CALCIUM 9.0 01/21/2022  ? PHOS 3.8 01/19/2022  ? PROT 6.0 (L) 01/19/2022  ? ALBUMIN 3.6 01/19/2022  ? BILITOT 1.4 (H) 01/19/2022  ? ALKPHOS 37 (L) 01/19/2022  ? AST 19 01/19/2022  ? ALT 19 01/19/2022  ? ANIONGAP 9 01/21/2022  ? ? ?CBG (last 3)  ?No results for input(s): GLUCAP in the last 72 hours.  ? ? ?Coagulation Profile: ?No results for input(s): INR, PROTIME in the last 168 hours. ? ? ?Radiology Studies: ?CT Head Wo Contrast ? ?Result Date: 01/19/2022 ?CLINICAL DATA:  Provided history: Facial fracture, follow-up. EXAM: CT HEAD WITHOUT CONTRAST TECHNIQUE: Contiguous axial images were obtained from the base of the skull through the vertex without intravenous contrast. RADIATION DOSE REDUCTION: This exam was performed according to the departmental dose-optimization program which includes automated exposure control, adjustment of the mA and/or kV according to patient size and/or use of iterative reconstruction technique. COMPARISON:  Prior head CT 03/25/2020 and earlier. FINDINGS: Mildly motion degraded exam. Brain: Mild generalized parenchymal atrophy. Moderate to advanced patchy and confluent hypoattenuation within the cerebral white matter, nonspecific but compatible with chronic small vessel ischemic disease. There is no acute intracranial hemorrhage. No demarcated cortical infarct. No extra-axial fluid collection. No evidence of an intracranial mass. No midline shift. Vascular: Dolichoectasia a of the basilar artery. No hyperdense vessel. Atherosclerotic calcifications. Skull: Normal. Negative for fracture or focal lesion. Sinuses/Orbits: Visualized orbits show no acute finding.  Trace scattered paranasal sinus mucosal thickening at the imaged levels. Other: No evidence of acute maxillofacial fracture at the imaged levels. Redemonstrated chronic fracture deformity of the left zygomatic ar

## 2022-01-21 NOTE — Progress Notes (Signed)
?  Echocardiogram ?2D Echocardiogram has been performed. ? Jacob Frank ?01/21/2022, 11:54 AM ?

## 2022-01-21 NOTE — Progress Notes (Signed)
RT note- PRN Albuterol given due I and E wheezing at this time. ?

## 2022-01-22 DIAGNOSIS — R531 Weakness: Secondary | ICD-10-CM | POA: Diagnosis not present

## 2022-01-22 DIAGNOSIS — F1010 Alcohol abuse, uncomplicated: Secondary | ICD-10-CM | POA: Diagnosis not present

## 2022-01-22 DIAGNOSIS — J449 Chronic obstructive pulmonary disease, unspecified: Secondary | ICD-10-CM | POA: Diagnosis not present

## 2022-01-22 DIAGNOSIS — I482 Chronic atrial fibrillation, unspecified: Secondary | ICD-10-CM | POA: Diagnosis not present

## 2022-01-22 LAB — BASIC METABOLIC PANEL
Anion gap: 8 (ref 5–15)
BUN: 14 mg/dL (ref 8–23)
CO2: 26 mmol/L (ref 22–32)
Calcium: 8.8 mg/dL — ABNORMAL LOW (ref 8.9–10.3)
Chloride: 98 mmol/L (ref 98–111)
Creatinine, Ser: 1.23 mg/dL (ref 0.61–1.24)
GFR, Estimated: >60 mL/min (ref >60)
Glucose, Bld: 91 mg/dL (ref 70–99)
Potassium: 3.6 mmol/L (ref 3.5–5.1)
Sodium: 132 mmol/L — ABNORMAL LOW (ref 135–145)

## 2022-01-22 MED ORDER — MIDODRINE HCL 5 MG PO TABS
10.0000 mg | ORAL_TABLET | Freq: Three times a day (TID) | ORAL | Status: DC
  Administered 2022-01-23 – 2022-01-26 (×10): 10 mg via ORAL
  Filled 2022-01-22 (×10): qty 2

## 2022-01-22 MED ORDER — MIDODRINE HCL 5 MG PO TABS
5.0000 mg | ORAL_TABLET | Freq: Three times a day (TID) | ORAL | Status: DC
  Administered 2022-01-22 (×2): 5 mg via ORAL
  Filled 2022-01-22 (×2): qty 1

## 2022-01-22 MED ORDER — SODIUM CHLORIDE 0.9 % IV BOLUS
1000.0000 mL | Freq: Once | INTRAVENOUS | Status: AC
  Administered 2022-01-22: 1000 mL via INTRAVENOUS

## 2022-01-22 NOTE — Progress Notes (Signed)
Physical Therapy Treatment ?Patient Details ?Name: Jacob Frank ?MRN: [id number] ?DOB: [birth year] ?Today's Date: 01/22/2022 ? ? ?History of Present Illness Pt is a 71 y/o male admitted secondary to worsening weakness and hyponatremia. PMH includes HTN, CHF, a fib, alcohol use and COPD. ? ?  ?PT Comments  ? ? Pt received supine and agreeable to session. Pt supervision for bed mobility and min guard for transfers. Pt motivated to be OOB and work with this Frank, stating he wants to ambulate and be up in chair but continues to be limited by + orthostatics and continued blurry vision, dizziness and nausea in sitting and standing. Pt with good tolerance for supine therex. Pt continues to benefit from skilled PT services to progress toward functional mobility goals.   ? ?BP supine: 120/88 ?BP sitting EOB: 104/83 ?BP standing: 83/62 ?BP supine recovery: 121/85 ?  ?Recommendations for follow up therapy are one component of a multi-disciplinary discharge planning process, led by the attending physician.  Recommendations may be updated based on patient status, additional functional criteria and insurance authorization. ? ?Follow Up Recommendations ? Home health PT (pending progress) ?  ?  ?Assistance Recommended at Discharge Intermittent Supervision/Assistance  ?Patient can return home with the following Assistance with cooking/housework;Help with stairs or ramp for entrance;Assist for transportation ?  ?Equipment Recommendations ? Other (comment) (pt requesting cane, will need to practice)  ?  ?Recommendations for Other Services   ? ? ?  ?Precautions / Restrictions Precautions ?Precautions: Fall;Other (comment) (watch BP) ?Precaution Comments: 5 falls within the past two weeks, reports feeling lightheaded and then lowering self to the ground ?Restrictions ?Weight Bearing Restrictions: No  ?  ? ?Mobility ? Bed Mobility ?Overal bed mobility: Needs Assistance ?Bed Mobility: Sit to Supine ?  ?  ?  ?Sit to supine: Supervision ?  ?  ?   ? ?Transfers ?Overall transfer level: Needs assistance ?Equipment used: Rolling walker (2 wheels) ?Transfers: Sit to/from Stand, Bed to chair/wheelchair/BSC ?Sit to Stand: Min guard ?  ?  ?  ?  ?  ?General transfer comment: pt symptomatic, reporting blurry vision and dizziness with sitting and standing for extended period of time ?  ? ?Ambulation/Gait ?  ?  ?  ?  ?  ?  ?  ?General Gait Details: defered secondary to + orthostatics and symptomatic  ? ? ?Stairs ?  ?  ?  ?  ?  ? ? ?Wheelchair Mobility ?  ? ?Modified Rankin (Stroke Patients Only) ?  ? ? ?  ?Balance Overall balance assessment: Needs assistance ?Sitting-balance support: No upper extremity supported ?Sitting balance-Leahy Scale: Fair ?Sitting balance - Comments: sits EOB to eat breakfast without LOB ?  ?Standing balance support: Single extremity supported ?Standing balance-Leahy Scale: Fair ?Standing balance comment: sways anterior/posterior with static standing ?  ?  ?  ?  ?  ?  ?  ?  ?  ?  ?  ?  ? ?  ?Cognition Arousal/Alertness: Awake/alert ?Behavior During Therapy: [facility] for tasks assessed/performed ?Overall Cognitive Status: No family/caregiver present to determine baseline cognitive functioning ?  ?  ?  ?  ?  ?  ?  ?  ?  ?  ?  ?  ?  ?  ?  ?  ?General Comments: a & o x4 however, increased response time ?  ?  ? ?  ?Exercises General Exercises - Lower Extremity ?Ankle Circles/Pumps: AROM, Both, 20 reps, Seated, Supine ?Hip ABduction/ADduction: AROM, Left, Right, 20 reps, Supine ?Straight Leg Raises:  AROM, Both, 20 reps, Supine ? ?  ?General Comments General comments (skin integrity, edema, etc.): O2 sats >92% on RA throughout, + orhtostatics ?  ?  ? ?Pertinent Vitals/Pain Pain Assessment ?Pain Assessment: Faces ?Faces Pain Scale: Hurts a little bit ?Pain Location: back ?Pain Descriptors / Indicators: Sore ?Pain Intervention(s): Limited activity within patient's tolerance, Monitored during session, Repositioned  ? ? ?Home Living   ?  ?  ?  ?  ?  ?  ?  ?  ?   ?   ?  ?Prior Function    ?  ?  ?   ? ?PT Goals (current goals can now be found in the care plan section) Acute Rehab PT Goals ?PT Goal Formulation: With patient ?Time For Goal Achievement: 02/03/22 ? ?  ?Frequency ? ? ? Min 3X/week ? ? ? ?  ?PT Plan    ? ? ?Co-evaluation   ?  ?  ?  ?  ? ?  ?AM-PAC PT "6 Clicks" Mobility   ?Outcome Measure ? Help needed turning from your back to your side while in a flat bed without using bedrails?: None ?Help needed moving from lying on your back to sitting on the side of a flat bed without using bedrails?: None ?Help needed moving to and from a bed to a chair (including a wheelchair)?: A Little ?Help needed standing up from a chair using your arms (e.g., wheelchair or bedside chair)?: A Little ?Help needed to walk in hospital room?: A Little ?Help needed climbing 3-5 steps with a railing? : A Little ?6 Click Score: 20 ? ?  ?End of Session Equipment Utilized During Treatment: Gait belt ?Activity Tolerance: Treatment limited secondary to medical complications (Comment) (+ orthostatics) ?Patient left: in bed;with call bell/phone within reach;with bed alarm set ?Nurse Communication: Mobility status ?PT Visit Diagnosis: Unsteadiness on feet (R26.81);Muscle weakness (generalized) (M62.81);Difficulty in walking, not elsewhere classified (R26.2);Dizziness and giddiness (R42) ?  ? ? ?Time: 8938-1017 ?PT Time Calculation (min) (ACUTE ONLY): 21 min ? ?Charges:  $Therapeutic Activity: 8-22 mins          ?          ? ?Jacob Frank ?Acute Rehabilitation Services ?Office: [phone] ? ? ? ?Jacob Frank ?01/22/2022, 9:32 AM ? ?

## 2022-01-22 NOTE — Progress Notes (Signed)
? ? ? [facility] ?                                                                            ? ? ?Jacob Frank, is a 71 y.o. male, DOB - January 04, 1951, CBU:[id number] ?Admit date - 01/19/2022    ?Outpatient Primary MD for the patient is McGowen, Adrian Blackwater, MD ? ?LOS - 2  days ? ? ? ?Brief summary  ? ?Jacob Frank is a 71 y.o. male with medical history significant of alcohol use, atrial fibrillation, COPD, hypertension, B12 deficiency, anxiety, BPH presenting with progressive weakness and dizziness. ? ?Patient has had at least 2 weeks of increasing weakness now needing assistance even to walk.  Of note he has had decreased p.o. intake for at least several weeks per PCP note.  He currently lives in a boardinghouse and has been able to be compliant with his medications. ?He was found to have hyponatremia. CT head without any abnormality.  ?  ?Assessment & Plan  ? ? ?Assessment and Plan: ? ?Generalized weakness with dizziness and ataxia secondary to Hyponatremia and subacute ischemia within the left frontal white matter.  ?In the setting of chronic alcohol use and decreased  oral intake for the last few weeks.  ?- probably secondary to hyponatremia, improving gradually.  ?- neurology consulted for evaluation of acute stroke.  Mri findings of the sub acute ischemia probably artifact or incidental, not related to his symptoms.  ?- start the patient on aspirin 325 mg daily.  ?- lipid panel reviewed  LDl IS 61, hemoglobin A1c IS 4.9%, echocardiogram and carotid duplex ordered.  ?Carotid duplex, MRA head and neck unremarkable.  ?Echocardiogram showed Left ventricular ejection fraction is 55 to 60%. The  left ventricle has normal function,  no regional wall motion abnormalities. Left ventricular diastolic parameters are consistent with Grade I diastolic dysfunction (impaired relaxation).  ?- Therapy evaluations ordered and recommended home health PT, OT.  ?- UDS is negative.  ? ? ?Paroxysmal  atrial fibrillation.  ?Rate  controlled. NSR. Metoprolol on hold for orthostatic hypotension.  ? Not on anti coagulation.  ?Echocardiogram  reviewed.  ? ? ?Hypertension:  ?Bp parameters are well controlled.  ? ? ?Chronic alcohol use:  ?Alcohol level is negative.  ?On CIWA For withdrawal symptoms.  ?Continue with thiamine and folic acid.  ? ? ?Acute hyponatremia:  ?Probably secondary to chronic alcohol use.  ?Serum osmo is 254, urine osmo is 322, urine sodium  is less than 10 .  ? ? ? ?Mild anemia  ?Monitor.  ? ?  ?COPD:  ?No wheezing heard.  ? ?Orthostatic hypotension:  ?Probably from autonomic dysfunction in view of alcohol use and deconditioning.  ?Compression stockings ordered.  ?Fluid bolus today and stop the metoprolol  ?Recheck orthostatic vital signs.  ? ? ? ? ? ?Interventions: Ensure Enlive (each supplement provides 350kcal and 20 grams of protein), MVI, Refer to RD note for recommendations ? ?Estimated body mass index is 30.72 kg/m? as calculated from the following: ?  Height as of this encounter: '5\' 9"'$  (1.753 m). ?  Weight as of this encounter: 94.3 kg. ? ?Code Status: full code.  ?DVT Prophylaxis:  enoxaparin (LOVENOX) injection 40  mg Start: 01/19/22 2045 ? ? ?Level of Care: Level of care: Med-Surg ?Family Communication: None at bedside.  ? ?Disposition Plan:     Remains inpatient appropriate:  hyponatremia, orthostatic hypotension. Probably plan for discharge when orthostatics improve.  ? ?Procedures:  ?MRI brain.  ?Echocardiogram.  ? ?Consultants:   ?Neurology ?Cardiology. ? ?Antimicrobials:  ? ?Anti-infectives (From admission, onward)  ? ? None  ? ?  ? ? ? ?Medications ? ?Scheduled Meds: ?  stroke: early stages of recovery book   Does not apply Once  ? arformoterol  15 mcg Nebulization BID  ? And  ? umeclidinium bromide  1 puff Inhalation Daily  ? aspirin  300 mg Rectal Daily  ? Or  ? aspirin  325 mg Oral Daily  ? buPROPion  300 mg Oral Daily  ? enoxaparin (LOVENOX) injection  40 mg Subcutaneous Q24H  ? feeding supplement  237 mL  Oral TID BM  ? folic acid  1 mg Oral Daily  ? [START ON 01/23/2022] midodrine  10 mg Oral TID WC  ? multivitamin with minerals  1 tablet Oral Daily  ? pantoprazole  40 mg Oral Daily  ? QUEtiapine  400 mg Oral BID  ? sodium chloride flush  3 mL Intravenous Q12H  ? thiamine  100 mg Oral Daily  ? ?Continuous Infusions: ? sodium chloride    ? ? ?PRN Meds:.acetaminophen **OR** acetaminophen, albuterol, LORazepam **OR** LORazepam, polyethylene glycol ? ? ? ?Subjective:  ? ?Jacob Frank was seen and examined today.  STILL feeling dizzy. No chest pain or sob.  ? ?Objective:  ? ?Vitals:  ? 01/22/22 0456 01/22/22 0753 01/22/22 0754 01/22/22 1311  ?BP: 104/79  120/87 104/79  ?Pulse: 74  60 70  ?Resp:    18  ?Temp: 98.7 ?F (37.1 ?C)  98 ?F (36.7 ?C) 98 ?F (36.7 ?C)  ?TempSrc: Oral  Oral   ?SpO2: 95% 100% 100% 94%  ?Weight:      ?Height:      ? ? ?Intake/Output Summary (Last 24 hours) at 01/22/2022 1822 ?Last data filed at 01/22/2022 1400 ?Gross per 24 hour  ?Intake 240 ml  ?Output 750 ml  ?Net -510 ml  ? ? ?Filed Weights  ? 01/19/22 1658  ?Weight: 94.3 kg  ? ? ? ?Exam ?General exam: Appears calm and comfortable  ?Respiratory system: Clear to auscultation. Respiratory effort normal. ?Cardiovascular system: S1 & S2 heard, RRR. No JVD, No pedal edema. ?Gastrointestinal system: Abdomen is nondistended, soft and nontender.  Normal bowel sounds heard. ?Central nervous system: Alert and oriented. No focal neurological deficits. ?Extremities: Symmetric 5 x 5 power. ?Skin: No rashes, lesions or ulcers ?Psychiatry: Mood & affect appropriate.  ? ? ? ?Data Reviewed:  I have personally reviewed following labs and imaging studies ? ? ?CBC ?Lab Results  ?Component Value Date  ? WBC 4.9 01/21/2022  ? RBC 3.95 (L) 01/21/2022  ? HGB 12.7 (L) 01/21/2022  ? HCT 37.5 (L) 01/21/2022  ? MCV 94.9 01/21/2022  ? MCH 32.2 01/21/2022  ? PLT 175 01/21/2022  ? MCHC 33.9 01/21/2022  ? RDW 14.4 01/21/2022  ? LYMPHSABS 1.2 01/21/2022  ? MONOABS 0.6 01/21/2022  ?  EOSABS 0.1 01/21/2022  ? BASOSABS 0.0 01/21/2022  ? ? ? ?Last metabolic panel ?Lab Results  ?Component Value Date  ? NA 132 (L) 01/22/2022  ? K 3.6 01/22/2022  ? CL 98 01/22/2022  ? CO2 26 01/22/2022  ? BUN 14 01/22/2022  ? CREATININE 1.23 01/22/2022  ?  GLUCOSE 91 01/22/2022  ? GFRNONAA >60 01/22/2022  ? GFRAA >60 03/30/2020  ? CALCIUM 8.8 (L) 01/22/2022  ? PHOS 3.8 01/19/2022  ? PROT 6.0 (L) 01/19/2022  ? ALBUMIN 3.6 01/19/2022  ? BILITOT 1.4 (H) 01/19/2022  ? ALKPHOS 37 (L) 01/19/2022  ? AST 19 01/19/2022  ? ALT 19 01/19/2022  ? ANIONGAP 8 01/22/2022  ? ? ?CBG (last 3)  ?No results for input(s): GLUCAP in the last 72 hours.  ? ? ?Coagulation Profile: ?No results for input(s): INR, PROTIME in the last 168 hours. ? ? ?Radiology Studies: ?MR ANGIO HEAD WO CONTRAST ? ?Result Date: 01/20/2022 ?CLINICAL DATA:  Stroke follow-up EXAM: MRA NECK WITHOUT CONTRAST MRA HEAD WITHOUT CONTRAST TECHNIQUE: Angiographic images of the Circle of Willis were acquired using MRA technique without intravenous contrast. COMPARISON:  None. FINDINGS: MRA NECK FINDINGS No occlusion or stenosis of the carotid or vertebral arteries based on time-of-flight imaging of the neck. Vertebral system is codominant. MRA HEAD FINDINGS POSTERIOR CIRCULATION: --Vertebral arteries: Normal --Inferior cerebellar arteries: Normal. --Basilar artery: Normal. --Superior cerebellar arteries: Normal. --Posterior cerebral arteries: Normal. ANTERIOR CIRCULATION: --Intracranial internal carotid arteries: Normal. --Anterior cerebral arteries (ACA): Normal. --Middle cerebral arteries (MCA): Normal. ANATOMIC VARIANTS: None IMPRESSION: Normal MRA of the head and neck. Electronically Signed   By: Ulyses Jarred M.D.   On: 01/20/2022 20:16  ? ?MR ANGIO NECK WO CONTRAST ? ?Result Date: 01/20/2022 ?CLINICAL DATA:  Stroke follow-up EXAM: MRA NECK WITHOUT CONTRAST MRA HEAD WITHOUT CONTRAST TECHNIQUE: Angiographic images of the Circle of Willis were acquired using MRA technique  without intravenous contrast. COMPARISON:  None. FINDINGS: MRA NECK FINDINGS No occlusion or stenosis of the carotid or vertebral arteries based on time-of-flight imaging of the neck. Vertebral system is codom

## 2022-01-22 NOTE — Plan of Care (Signed)
  Problem: Education: Goal: Knowledge of disease or condition will improve Outcome: Progressing   

## 2022-01-23 DIAGNOSIS — J449 Chronic obstructive pulmonary disease, unspecified: Secondary | ICD-10-CM | POA: Diagnosis not present

## 2022-01-23 DIAGNOSIS — R531 Weakness: Secondary | ICD-10-CM | POA: Diagnosis not present

## 2022-01-23 DIAGNOSIS — F1010 Alcohol abuse, uncomplicated: Secondary | ICD-10-CM | POA: Diagnosis not present

## 2022-01-23 DIAGNOSIS — I482 Chronic atrial fibrillation, unspecified: Secondary | ICD-10-CM | POA: Diagnosis not present

## 2022-01-23 LAB — BASIC METABOLIC PANEL
Anion gap: 8 (ref 5–15)
BUN: 15 mg/dL (ref 8–23)
CO2: 27 mmol/L (ref 22–32)
Calcium: 8.9 mg/dL (ref 8.9–10.3)
Chloride: 97 mmol/L — ABNORMAL LOW (ref 98–111)
Creatinine, Ser: 0.93 mg/dL (ref 0.61–1.24)
GFR, Estimated: >60 mL/min (ref >60)
Glucose, Bld: 89 mg/dL (ref 70–99)
Potassium: 3.8 mmol/L (ref 3.5–5.1)
Sodium: 132 mmol/L — ABNORMAL LOW (ref 135–145)

## 2022-01-23 LAB — VITAMIN E
Vitamin E (Alpha Tocopherol): 8.1 mg/L — ABNORMAL LOW (ref 9.0–29.0)
Vitamin E(Gamma Tocopherol): 0.7 mg/L (ref 0.5–4.9)

## 2022-01-23 LAB — COPPER, SERUM: Copper: 93 ug/dL (ref 69–132)

## 2022-01-23 LAB — VITAMIN A: Vitamin A (Retinoic Acid): 36.8 ug/dL (ref 22.0–69.5)

## 2022-01-23 MED ORDER — SODIUM CHLORIDE 0.9 % IV BOLUS
500.0000 mL | Freq: Once | INTRAVENOUS | Status: AC
  Administered 2022-01-23: 500 mL via INTRAVENOUS

## 2022-01-23 NOTE — Progress Notes (Signed)
? ? ? [facility] ?                                                                            ? ? ?Jacob Frank, is a 71 y.o. male, DOB - 10-May-1951, JFH:[id number] ?Admit date - 01/19/2022    ?Outpatient Primary MD for the patient is McGowen, Adrian Blackwater, MD ? ?LOS - 3  days ? ? ? ?Brief summary  ? ?Jacob Frank is a 71 y.o. male with medical history significant of alcohol use, atrial fibrillation, COPD, hypertension, B12 deficiency, anxiety, BPH presenting with progressive weakness and dizziness. ? ?Patient has had at least 2 weeks of increasing weakness now needing assistance even to walk.  Of note he has had decreased p.o. intake for at least several weeks per PCP note.  He currently lives in a boardinghouse and has been able to be compliant with his medications. ?He was found to have hyponatremia. CT head without any abnormality.  ?  ?Assessment & Plan  ? ? ?Assessment and Plan: ? ?Generalized weakness with dizziness and ataxia secondary to Hyponatremia and subacute ischemia within the left frontal white matter.  ?In the setting of chronic alcohol use and decreased  oral intake for the last few weeks.  ?- probably secondary to hyponatremia, improving gradually.  ?- neurology consulted for evaluation of acute stroke.  Mri findings of the sub acute ischemia probably artifact or incidental, not related to his symptoms.  ?- start the patient on aspirin 325 mg daily.  ?- lipid panel reviewed  LDl IS 61, hemoglobin A1c IS 4.9%, echocardiogram and carotid duplex ordered.  ?Carotid duplex, MRA head and neck unremarkable.  ?Echocardiogram showed Left ventricular ejection fraction is 55 to 60%. The  left ventricle has normal function,  no regional wall motion abnormalities. Left ventricular diastolic parameters are consistent with Grade I diastolic dysfunction (impaired relaxation).  ?- Therapy evaluations ordered and recommended home health PT, OT. TOC on board, to get back to [place] to see if he can be discharged back to  boarding house.  ?- UDS is negative.  ? ? ?Paroxysmal  atrial fibrillation.  ?Rate controlled. NSR. Metoprolol on hold for orthostatic hypotension.  ? Not on anti coagulation due to recurrent falls, alcohol use.  ?Echocardiogram  reviewed.  ? ? ?Hypertension:  ?Bp parameters are optimal.  ? ? ?Chronic alcohol use:  ?Alcohol level is negative.  ?On CIWA For withdrawal symptoms.  ?Continue with thiamine and folic acid.  ?So far no signs of withdrawal.  ? ? ?Acute hyponatremia:  ?Probably secondary to chronic alcohol use.  ?Serum osmo is 254, urine osmo is 322, urine sodium  is less than 10 .  ?Sodium improved to 132 and stable.  ? ? ? ?Mild anemia  ?Monitor.  ? ?  ?COPD:  ?No wheezing heard. On RA.  ? ?Orthostatic hypotension:  ?Probably from autonomic dysfunction in view of  chronic alcohol use and deconditioning.  ?Compression stockings ordered.  ?Stopped the metoprolol. Fluid bolus ordered.  ?Repeat orthostatics still positive, but better than on admission and his symptoms of dizziness have improved. Started him on midodrine 10 mg TID.  ?Recheck orthostatics in am and if improved, should be good  to go back to boarding house.  ? ? ? ? ? ?Interventions: Ensure Enlive (each supplement provides 350kcal and 20 grams of protein), MVI, Refer to RD note for recommendations ? ?Estimated body mass index is 30.72 kg/m? as calculated from the following: ?  Height as of this encounter: '5\' 9"'$  (1.753 m). ?  Weight as of this encounter: 94.3 kg. ? ?Code Status: full code.  ?DVT Prophylaxis:  enoxaparin (LOVENOX) injection 40 mg Start: 01/19/22 2045 ? ? ?Level of Care: Level of care: Med-Surg ?Family Communication: None at bedside.  ? ?Disposition Plan:     Remains inpatient appropriate: orthostatic hypotension. Probably plan for discharge when orthostatics improve.  ? ?Procedures:  ?MRI brain.  ?Echocardiogram.  ? ?Consultants:   ?Neurology ?Cardiology. ? ?Antimicrobials:  ? ?Anti-infectives (From admission, onward)  ? ? None  ? ?   ? ? ? ?Medications ? ?Scheduled Meds: ?  stroke: early stages of recovery book   Does not apply Once  ? arformoterol  15 mcg Nebulization BID  ? And  ? umeclidinium bromide  1 puff Inhalation Daily  ? aspirin  300 mg Rectal Daily  ? Or  ? aspirin  325 mg Oral Daily  ? buPROPion  300 mg Oral Daily  ? enoxaparin (LOVENOX) injection  40 mg Subcutaneous Q24H  ? feeding supplement  237 mL Oral TID BM  ? folic acid  1 mg Oral Daily  ? midodrine  10 mg Oral TID WC  ? multivitamin with minerals  1 tablet Oral Daily  ? pantoprazole  40 mg Oral Daily  ? QUEtiapine  400 mg Oral BID  ? sodium chloride flush  3 mL Intravenous Q12H  ? thiamine  100 mg Oral Daily  ? ?Continuous Infusions: ? ? ? ?PRN Meds:.acetaminophen **OR** acetaminophen, albuterol, polyethylene glycol ? ? ? ?Subjective:  ? ?Jacob Frank was seen and examined today.  Feeling better than yesterday, no chest pain or sob.  ?Some dizziness, ? ?Objective:  ? ?Vitals:  ? 01/22/22 1920 01/23/22 0340 01/23/22 0810 01/23/22 1022  ?BP: 132/87 (!) 126/95    ?Pulse: 77 76    ?Resp: 18 20    ?Temp: 97.7 ?F (36.5 ?C) 98 ?F (36.7 ?C)  97.6 ?F (36.4 ?C)  ?TempSrc:    Oral  ?SpO2: 92% 95% 95%   ?Weight:      ?Height:      ? ? ?Intake/Output Summary (Last 24 hours) at 01/23/2022 1739 ?Last data filed at 01/23/2022 0530 ?Gross per 24 hour  ?Intake 240 ml  ?Output 1200 ml  ?Net -960 ml  ? ? ?Filed Weights  ? 01/19/22 1658  ?Weight: 94.3 kg  ? ? ? ?Exam ?General exam: Disheveled , elderly gentleman, not in distress.  ?Respiratory system: Clear to auscultation. Respiratory effort normal. ?Cardiovascular system: S1 & S2 heard, RRR. No JVD, No pedal edema. ?Gastrointestinal system: Abdomen is nondistended, soft and nontender.  Normal bowel sounds heard. ?Central nervous system: Alert and oriented. No focal neurological deficits. ?Extremities: Symmetric 5 x 5 power. ?Skin: No rashes, lesions or ulcers ?Psychiatry: mood is appropriate.   ? ? ? ? ?Data Reviewed:  I have personally reviewed  following labs and imaging studies ? ? ?CBC ?Lab Results  ?Component Value Date  ? WBC 4.9 01/21/2022  ? RBC 3.95 (L) 01/21/2022  ? HGB 12.7 (L) 01/21/2022  ? HCT 37.5 (L) 01/21/2022  ? MCV 94.9 01/21/2022  ? MCH 32.2 01/21/2022  ? PLT 175 01/21/2022  ? MCHC 33.9  01/21/2022  ? RDW 14.4 01/21/2022  ? LYMPHSABS 1.2 01/21/2022  ? MONOABS 0.6 01/21/2022  ? EOSABS 0.1 01/21/2022  ? BASOSABS 0.0 01/21/2022  ? ? ? ?Last metabolic panel ?Lab Results  ?Component Value Date  ? NA 132 (L) 01/23/2022  ? K 3.8 01/23/2022  ? CL 97 (L) 01/23/2022  ? CO2 27 01/23/2022  ? BUN 15 01/23/2022  ? CREATININE 0.93 01/23/2022  ? GLUCOSE 89 01/23/2022  ? GFRNONAA >60 01/23/2022  ? GFRAA >60 03/30/2020  ? CALCIUM 8.9 01/23/2022  ? PHOS 3.8 01/19/2022  ? PROT 6.0 (L) 01/19/2022  ? ALBUMIN 3.6 01/19/2022  ? BILITOT 1.4 (H) 01/19/2022  ? ALKPHOS 37 (L) 01/19/2022  ? AST 19 01/19/2022  ? ALT 19 01/19/2022  ? ANIONGAP 8 01/23/2022  ? ? ?CBG (last 3)  ?No results for input(s): GLUCAP in the last 72 hours.  ? ? ?Coagulation Profile: ?No results for input(s): INR, PROTIME in the last 168 hours. ? ? ?Radiology Studies: ?No results found. ? ? ? ? ?Hosie Poisson M.D. ?[facility] ?01/23/2022, 5:39 PM ? ?Available via Epic secure chat 7am-7pm ?After 7 pm, please refer to night coverage provider listed on amion. ? ? ? ?

## 2022-01-23 NOTE — Plan of Care (Signed)
?  Problem: Education: ?Goal: Knowledge of General Education information will improve ?Description: Including pain rating scale, medication(s)/side effects and non-pharmacologic comfort measures ?Outcome: Progressing ?  ?Problem: Health Behavior/Discharge Planning: ?Goal: Ability to manage health-related needs will improve ?Outcome: Progressing ?  ?Problem: Clinical Measurements: ?Goal: Ability to maintain clinical measurements within normal limits will improve ?Outcome: Progressing ?Goal: Will remain free from infection ?Outcome: Progressing ?Goal: Diagnostic test results will improve ?Outcome: Progressing ?Goal: Respiratory complications will improve ?Outcome: Progressing ?Goal: Cardiovascular complication will be avoided ?Outcome: Progressing ?  ?Problem: Activity: ?Goal: Risk for activity intolerance will decrease ?Outcome: Progressing ?  ?Problem: Nutrition: ?Goal: Adequate nutrition will be maintained ?Outcome: Progressing ?  ?Problem: Coping: ?Goal: Level of anxiety will decrease ?Outcome: Progressing ?  ?Problem: Elimination: ?Goal: Will not experience complications related to bowel motility ?Outcome: Progressing ?Goal: Will not experience complications related to urinary retention ?Outcome: Progressing ?  ?Problem: Pain Managment: ?Goal: General experience of comfort will improve ?Outcome: Progressing ?  ?Problem: Safety: ?Goal: Ability to remain free from injury will improve ?Outcome: Progressing ?  ?Problem: Skin Integrity: ?Goal: Risk for impaired skin integrity will decrease ?Outcome: Progressing ?  ?Problem: Education: ?Goal: Knowledge of disease or condition will improve ?Outcome: Progressing ?  ?

## 2022-01-23 NOTE — Progress Notes (Signed)
Occupational Therapy Treatment ?Patient Details ?Name: Jacob Frank ?MRN: [id number] ?DOB: Sep 04, 1951 ?Today's Date: 01/23/2022 ? ? ?History of present illness Pt is a 71 y/o male admitted secondary to worsening weakness and hyponatremia. PMH includes HTN, CHF, a fib, alcohol use and COPD. ?  ?OT comments ? Pt seen for OT session, wanting to "wash up". Pt able to complete UB and limited LB bathing with set up A. Pt completes stand pivot transfer min guard x2 during session without AD. Pt reporting mild dizziness at end of session, BP 122/84. Pt presenting with impairments listed below, will follow acutely. Continue to recommend HHOT at d/c.  ? ?Recommendations for follow up therapy are one component of a multi-disciplinary discharge planning process, led by the attending physician.  Recommendations may be updated based on patient status, additional functional criteria and insurance authorization. ?   ?Follow Up Recommendations ? Home health OT  ?  ?Assistance Recommended at Discharge Set up Supervision/Assistance  ?Patient can return home with the following ? A little help with walking and/or transfers;A little help with bathing/dressing/bathroom;Assistance with cooking/housework;Help with stairs or ramp for entrance;Assist for transportation ?  ?Equipment Recommendations ? BSC/3in1 (as shower seat)  ?  ?Recommendations for Other Services   ? ?  ?Precautions / Restrictions Precautions ?Precautions: Fall;Other (comment) (watch BP) ?Precaution Comments: 5 falls within the past two weeks, reports feeling lightheaded and then lowering self to the ground ?Restrictions ?Weight Bearing Restrictions: No  ? ? ?  ? ?Mobility Bed Mobility ?  ?  ?  ?  ?  ?  ?  ?General bed mobility comments: sitting EOB upon arrival ?  ? ?Transfers ?Overall transfer level: Needs assistance ?Equipment used: None ?Transfers: Sit to/from Stand, Bed to chair/wheelchair/BSC ?Sit to Stand: Min guard ?Stand pivot transfers: Min guard ?  ?  ?  ?  ?  ?  ?   ?Balance Overall balance assessment: Needs assistance ?Sitting-balance support: No upper extremity supported ?Sitting balance-Leahy Scale: Fair ?Sitting balance - Comments: minimally reaches outside BOS during bathing ?  ?Standing balance support: Single extremity supported ?Standing balance-Leahy Scale: Fair ?Standing balance comment: sways anterior/posterior with static standing ?  ?  ?  ?  ?  ?  ?  ?  ?  ?  ?  ?   ? ?ADL either performed or assessed with clinical judgement  ? ?ADL Overall ADL's : Needs assistance/impaired ?  ?  ?  ?  ?Upper Body Bathing: Sitting;Set up ?Upper Body Bathing Details (indicate cue type and reason): bathes face and UB sitting up in chair ?Lower Body Bathing: Sitting/lateral leans;Set up ?Lower Body Bathing Details (indicate cue type and reason): completed sitting in chair ?Upper Body Dressing : Sitting;Minimal assistance ?  ?  ?  ?Toilet Transfer: Min guard;BSC/3in1;Stand-pivot ?Toilet Transfer Details (indicate cue type and reason): simulated bed <>chair ?  ?  ?  ?  ?Functional mobility during ADLs: Min guard ?  ?  ? ?Extremity/Trunk Assessment Upper Extremity Assessment ?Upper Extremity Assessment: Generalized weakness ?  ?Lower Extremity Assessment ?Lower Extremity Assessment: Defer to PT evaluation ?  ?  ?  ? ?Vision   ?Vision Assessment?: No apparent visual deficits ?  ?Perception Perception ?Perception: Not tested ?  ?Praxis Praxis ?Praxis: Not tested ?  ? ?Cognition Arousal/Alertness: Awake/alert ?Behavior During Therapy: [facility] for tasks assessed/performed ?Overall Cognitive Status: No family/caregiver present to determine baseline cognitive functioning ?  ?  ?  ?  ?  ?  ?  ?  ?  ?  ?  ?  ?  ?  ?  ?  ?  General Comments: a & o x4 however, increased response time ?  ?  ?   ?Exercises   ? ?  ?Shoulder Instructions   ? ? ?  ?General Comments BP 122/84 after transfer to bed, pt reporting mild dizziness  ? ? ?Pertinent Vitals/ Pain       Pain Assessment ?Pain Assessment: No/denies  pain ? ?Home Living   ?  ?  ?  ?  ?  ?  ?  ?  ?  ?  ?  ?  ?  ?  ?  ?  ?  ?  ? ?  ?Prior Functioning/Environment    ?  ?  ?  ?   ? ?Frequency ? Min 3X/week  ? ? ? ? ?  ?Progress Toward Goals ? ?OT Goals(current goals can now be found in the care plan section) ? Progress towards OT goals: Progressing toward goals ? ?Acute Rehab OT Goals ?Patient Stated Goal: to get washed up ?OT Goal Formulation: With patient ?Time For Goal Achievement: 02/04/22 ?Potential to Achieve Goals: Good ?ADL Goals ?Pt Will Perform Upper Body Dressing: with modified independence;sitting;standing ?Pt Will Perform Lower Body Dressing: with modified independence;sitting/lateral leans;sit to/from stand ?Pt Will Transfer to Toilet: with modified independence;ambulating;regular height toilet ?Pt Will Perform Tub/Shower Transfer: Shower transfer;Tub transfer;ambulating;3 in 1;with modified independence  ?Plan Discharge plan remains appropriate;Frequency needs to be updated   ? ?Co-evaluation ? ? ?   ?  ?  ?  ?  ? ?  ?AM-PAC OT "6 Clicks" Daily Activity     ?Outcome Measure ? ? Help from another person eating meals?: None ?Help from another person taking care of personal grooming?: A Little ?Help from another person toileting, which includes using toliet, bedpan, or urinal?: A Little ?Help from another person bathing (including washing, rinsing, drying)?: A Little ?Help from another person to put on and taking off regular upper body clothing?: A Little ?Help from another person to put on and taking off regular lower body clothing?: A Little ?6 Click Score: 19 ? ?  ?End of Session   ? ?OT Visit Diagnosis: Unsteadiness on feet (R26.81);Other abnormalities of gait and mobility (R26.89);Muscle weakness (generalized) (M62.81) ?  ?Activity Tolerance Patient tolerated treatment well ?  ?Patient Left in bed;with call bell/phone within reach;with bed alarm set (sitting EOB) ?  ?Nurse Communication Mobility status ?  ? ?   ? ?Time: 3267-1245 ?OT Time  Calculation (min): 24 min ? ?Charges: OT General Charges ?$OT Visit: 1 Visit ?OT Treatments ?$Self Care/Home Management : 23-37 mins ? ?Lynnda Child, OTD, OTR/L ?Acute Rehab ?[phone] ? ?Kaylyn Lim ?01/23/2022, 5:04 PM ?

## 2022-01-23 NOTE — Care Management Important Message (Signed)
Important Message ? ?Patient Details  ?Name: Jacob Frank ?MRN: [id number] ?Date of Birth: 12-22-1950 ? ? ?Medicare Important Message Given:  Yes ? ? ? ? ?[name] ?01/23/2022, 3:42 PM ?

## 2022-01-23 NOTE — Progress Notes (Signed)
Physical Therapy Treatment ?Patient Details ?Name: Jacob Frank ?MRN: [id number] ?DOB: 1951/07/10 ?Today's Date: 01/23/2022 ? ? ?History of Present Illness Pt is a 71 y/o male admitted secondary to worsening weakness and hyponatremia. PMH includes HTN, CHF, a fib, alcohol use and COPD. ? ?  ?PT Comments  ? ? Pt received supine requesting ambulation to BR. Pt supervision for bed mobility and min guard for transfers and ambulation. Session focused on progression of gait training with RW. Despite pt stating feelings of leg weakness, pt without LOB throughout ambulation but with noted instability. Cues needed throughout for RW proximity as pt with tendency for flexed trunk and pushing RW away.  Pt with mild c/o dizziness but BP stable throughout session, 134/86 post ambulation. Pt will need to negotiate stairs before safe d/c to home. Pt continues to benefit from skilled PT services to progress toward functional mobility goals.  ?  ?Recommendations for follow up therapy are one component of a multi-disciplinary discharge planning process, led by the attending physician.  Recommendations may be updated based on patient status, additional functional criteria and insurance authorization. ? ?Follow Up Recommendations ? Home health PT (pending progress) ?  ?  ?Assistance Recommended at Discharge Intermittent Supervision/Assistance  ?Patient can return home with the following Assistance with cooking/housework;Help with stairs or ramp for entrance;Assist for transportation ?  ?Equipment Recommendations ? Other (comment) (pt requesting cane, will need to practice)  ?  ?Recommendations for Other Services   ? ? ?  ?Precautions / Restrictions Precautions ?Precautions: Fall;Other (comment) (watch BP) ?Precaution Comments: 5 falls within the past two weeks, reports feeling lightheaded and then lowering self to the ground ?Restrictions ?Weight Bearing Restrictions: No  ?  ? ?Mobility ? Bed Mobility ?Overal bed mobility: Needs  Assistance ?Bed Mobility: Supine to Sit ?  ?  ?Supine to sit: Supervision ?  ?  ?  ?  ? ?Transfers ?Overall transfer level: Needs assistance ?Equipment used: Rolling walker (2 wheels) ?Transfers: Sit to/from Stand, Bed to chair/wheelchair/BSC ?Sit to Stand: Min guard ?  ?  ?  ?  ?  ?General transfer comment: min guard for safety from EOB and toilet ?  ? ?Ambulation/Gait ?Ambulation/Gait assistance: Min guard ?Gait Distance (Feet): 75 Feet (15+60) ?Assistive device: Rolling walker (2 wheels) ?Gait Pattern/deviations: Step-through pattern, Trunk flexed, Decreased stride length ?Gait velocity: decr ?  ?  ?General Gait Details: cues for upright posture and RW proximity as pt with tendency to flex trunk and push walker out in front. No LOB but noted instabilty, pt with complaints of leg weakness secondary to decreased activity. distance limited to pt stated tolerance ? ? ?Stairs ?  ?  ?  ?  ?  ? ? ?Wheelchair Mobility ?  ? ?Modified Rankin (Stroke Patients Only) ?  ? ? ?  ?Balance Overall balance assessment: Needs assistance ?Sitting-balance support: No upper extremity supported ?Sitting balance-Leahy Scale: Fair ?Sitting balance - Comments: sits EOB to eat breakfast without LOB ?  ?Standing balance support: Single extremity supported ?Standing balance-Leahy Scale: Fair ?  ?  ?  ?  ?  ?  ?  ?  ?  ?  ?  ?  ?  ? ?  ?Cognition Arousal/Alertness: Awake/alert ?Behavior During Therapy: [facility] for tasks assessed/performed ?Overall Cognitive Status: No family/caregiver present to determine baseline cognitive functioning ?  ?  ?  ?  ?  ?  ?  ?  ?  ?  ?  ?  ?  ?  ?  ?  ?  ?  ?  ? ?  ?  Exercises General Exercises - Lower Extremity ?Ankle Circles/Pumps: AROM, Both, 20 reps, Seated, Supine ?Hip Flexion/Marching: AROM, Right, Left, 20 reps, Seated ? ?  ?General Comments   ?  ?  ? ?Pertinent Vitals/Pain Pain Assessment ?Pain Assessment: Faces ?Faces Pain Scale: Hurts a little bit ?Pain Location: generalized ?Pain Descriptors / Indicators:  Sore ?Pain Intervention(s): Limited activity within patient's tolerance, Monitored during session, Repositioned  ? ? ?Home Living   ?  ?  ?  ?  ?  ?  ?  ?  ?  ?   ?  ?Prior Function    ?  ?  ?   ? ?PT Goals (current goals can now be found in the care plan section) Acute Rehab PT Goals ?PT Goal Formulation: With patient ?Time For Goal Achievement: 02/03/22 ? ?  ?Frequency ? ? ? Min 3X/week ? ? ? ?  ?PT Plan    ? ? ?Co-evaluation   ?  ?  ?  ?  ? ?  ?AM-PAC PT "6 Clicks" Mobility   ?Outcome Measure ? Help needed turning from your back to your side while in a flat bed without using bedrails?: None ?Help needed moving from lying on your back to sitting on the side of a flat bed without using bedrails?: None ?Help needed moving to and from a bed to a chair (including a wheelchair)?: A Little ?Help needed standing up from a chair using your arms (e.g., wheelchair or bedside chair)?: A Little ?Help needed to walk in hospital room?: A Little ?Help needed climbing 3-5 steps with a railing? : A Little ?6 Click Score: 20 ? ?  ?End of Session Equipment Utilized During Treatment: Gait belt ?Activity Tolerance: Patient tolerated treatment well ?Patient left: with call bell/phone within reach;in chair;with chair alarm set ?Nurse Communication: Mobility status ?PT Visit Diagnosis: Unsteadiness on feet (R26.81);Muscle weakness (generalized) (M62.81);Difficulty in walking, not elsewhere classified (R26.2);Dizziness and giddiness (R42) ?  ? ? ?Time: 0076-2263 ?PT Time Calculation (min) (ACUTE ONLY): 25 min ? ?Charges:  $Gait Training: 8-22 mins ?$Therapeutic Activity: 8-22 mins          ?          ? ?Audry Riles. PTA ?Acute Rehabilitation Services ?Office: [phone] ? ? ? ?Betsey Holiday Jess Toney ?01/23/2022, 9:46 AM ? ?

## 2022-01-23 NOTE — Progress Notes (Signed)
RT note- Patient complaining of headache, RN notified ?

## 2022-01-23 NOTE — Consult Note (Signed)
? ?  [facility] CM Inpatient Consult ? ? ?01/23/2022 ? ?Jacob Frank ?08-01-1951 ?[id number] ? ?[name] ? ?Primary Care Provider:  [name], MD, [facility], is an embedded provider with a Chronic Care Management team and program, and is listed for the transition of care follow up and appointments. ? ?Patient was screened for Embedded practice service needs for chronic care management. Met with the patient at the bedside. Patient states he is having difficulty remember who the [place] in [place].  States, "I live in a [place] with 5 people upstairs and 6 people down stairs. I am hoping to go to a facility for rehab cause I am pretty weak."  ?Plan: A referral request can be made to the Embedded Chronic Care Management team member for post hospital needs. Will continue to follow with inpatient [facility] team. ? ?Please contact for further questions, ? ?Natividad Brood, RN BSN CCM ?[facility] Liaison ? [phone] business mobile phone ?Toll free office [phone]  ?Fax number: [phone] ?[email or web address] ? ? ? ?

## 2022-01-24 DIAGNOSIS — R531 Weakness: Secondary | ICD-10-CM | POA: Diagnosis not present

## 2022-01-24 LAB — VITAMIN D 25 HYDROXY (VIT D DEFICIENCY, FRACTURES)

## 2022-01-24 LAB — VITAMIN C: Vitamin C: 0.5 mg/dL (ref 0.4–2.0)

## 2022-01-24 MED ORDER — SODIUM CHLORIDE 0.9 % IV BOLUS
1000.0000 mL | Freq: Once | INTRAVENOUS | Status: AC
  Administered 2022-01-24: 1000 mL via INTRAVENOUS

## 2022-01-24 MED ORDER — SODIUM CHLORIDE 0.9 % IV SOLN: INTRAVENOUS | Status: DC

## 2022-01-24 NOTE — Progress Notes (Signed)
Mobility Specialist Progress Note  ? ? 01/24/22 1242  ?Mobility  ?Activity Ambulated with assistance in hallway  ?Level of Assistance Contact guard assist, steadying assist  ?Assistive Device Front wheel walker  ?Distance Ambulated (ft) 100 ft  ?Activity Response Tolerated fair  ?$Mobility charge 1 Mobility  ? ?Pt received in bed and agreeable. C/o dizziness and being tired. Returned to sitting EOB with call bell in reach and bed alarm on.  ? ?Hildred Alamin ?Mobility Specialist  ?Primary: 5N M.S. Phone: [phone] ?Secondary: 6N M.S. Phone: [phone] ?  ?

## 2022-01-24 NOTE — Progress Notes (Addendum)
?PROGRESS NOTE ? ? ? ?Jacob Frank  HUT:[id number] DOB: 1951-09-05 DOA: 01/19/2022 ?PCP: [name], MD  ? ? ?Brief Narrative:  ? ?Jacob Frank is a 71 year old male with past medical history significant for EtOH abuse, paroxysmal atrial fibrillation, COPD, HTN, B12 deficiency, anxiety, BPH who presented to [facility] ED on 4/21 from his boarding home with weakness and dizziness.  Patient reports onset over the last 2 weeks, now with needing assistance to even walk.  He reports decreased oral intake for at least several weeks per PCP note.  Current lives in a boardinghouse, and reports compliance with his home medications.  Denies fever/chills/chest pain, shortness of breath, abdominal pain, constipation, diarrhea, nausea/vomiting. ? ?In the ED, heart rate initially in the 110s, improved to the 90s after IV fluid bolus.  Sodium 121, chloride 87, magnesium level normal.  CBC within normal limits.  EtOH level less than 10.  CT head without acute intracranial abnormality.  Patient was given IV fluid bolus in addition to folate and thiamine.  Hospital service consulted for further evaluation and management of hyponatremia in the setting of EtOH abuse and weakness/dizziness. ? ? ?Assessment & Plan: ?  ?Hyponatremia ?Patient was found to have low sodium of 121 on admission.  Serum osmolality 254, urine osmolality 322, urine sodium less than 10.  Etiology likely secondary to chronic alcohol abuse.  Patient was started IV fluid hydration with improvement of sodium to 132. ?--Continue IV fluid hydration ?--Counseled on need for complete EtOH cessation ?--BMP in a.m. ? ?Generalized weakness ?Orthostatic hypotension ?Patient presenting with generalized weakness, gait disturbance.  Etiology likely multifactorial in the setting of autonomic dysfunction in the view of chronic alcohol abuse and deconditioning with poor oral intake. ?--Home metoprolol discontinued ?--Midodrine 10 mg p.o. 3 times daily ?--TED hose ?--IV fluid  hydration ?--Orthostatics daily ?--Continue PT/OT efforts ? ?Paroxysmal atrial fibrillation ?Currently in normal sinus rhythm.  Metoprolol discontinued due to orthostatic hypotension.  Not on chronic anticoagulation due to recurrent falls and continued alcohol abuse. ?--Monitor on telemetry ? ?Chronic alcohol abuse ?EtOH level less than 10 on admission.  Monitor on CIWA for withdrawal symptoms, no significant withdrawal symptoms noted during hospitalization. ?--Thiamine, folic acid, multivitamin ?--Counsel need for complete cessation ?--TOC for subs abuse resources ? ?Head imaging abnormality likely secondary to artifact ?CT head without contrast with no acute abnormality.  MR brain with small focus of subacute ischemia within the left frontal white matter; seen by neurology and doubt small lacunar infarct in the left frontal white matter and cyst suspicious for artifact.  MRA head/neck unrevealing.  TTE with LVEF 65-6%, grade 1 diastolic dysfunction, redundancy of atrial septum.  Carotid Dopplers extracranial vessels near normal with minimal plaque, antegrade flow bilateral vertebral arteries.  LDL 61, hemoglobin A1c 4.9.  No further work-up per neurology, Dr. Leonie Man. ? ?Anxiety/depression ?--Wellbutrin 300 mg p.o. daily ?--Seroquel 400 mg p.o. twice daily ? ?COPD ?--Brovana neb twice daily ?--Incruse Ellipta 1 puff daily ? ?Physical deconditioning ?--PT and OT following, recommending home health ?--May be an issue as patient lives in a boardinghouse per case management ?--Continue therapy efforts while inpatient ? ? ?DVT prophylaxis: enoxaparin (LOVENOX) injection 40 mg Start: 01/19/22 2045 ? ?  Code Status: Full Code ?Family Communication: No family present at bedside this morning ? ?Disposition Plan:  ?Level of care: Med-Surg ?Status is: Inpatient ?Remains inpatient appropriate because: Remains orthostatic, continuing on IV fluid hydration, from boardinghouse, social work to assess if able to order home health on  discharge ?  ? ?Consultants:  ?Neurology ? ?Procedures:  ?TTE ?Carotid duplex ultrasound ? ?Antimicrobials:  ?None ? ? ?Subjective: ?Patient seen examined bedside, lying in bed.  Continues with intermittent dizziness upon standing.  Repeat orthostatic vital signs remain positive, will repeat IV fluid bolus followed by continuous infusion today.  Continues to work with PT/OT, able to ambulate 100 feet with a walker today.  Discussed with social work/case management, may be difficult to obtain home health at his boardinghouse, they will evaluate further.  No other specific complaints or concerns at this time.  Denies headache, no chest pain, palpitations, no abdominal pain, no fever/chills/night sweats, no nausea/vomiting/diarrhea, no fatigue, no paresthesias.  No acute events overnight per nurse staff. ? ?Objective: ?Vitals:  ? 01/23/22 1022 01/23/22 2110 01/24/22 0503 01/24/22 0850  ?BP:  135/89 (!) 123/107   ?Pulse:  83 83 80  ?Resp:  17  18  ?Temp: 97.6 ?F (36.4 ?C) 98.5 ?F (36.9 ?C)    ?TempSrc: Oral Oral    ?SpO2:  95% 91%   ?Weight:      ?Height:      ? ? ?Intake/Output Summary (Last 24 hours) at 01/24/2022 1449 ?Last data filed at 01/24/2022 0556 ?Gross per 24 hour  ?Intake 240 ml  ?Output 1350 ml  ?Net -1110 ml  ? ?Filed Weights  ? 01/19/22 1658  ?Weight: 94.3 kg  ? ? ?Examination: ? ?Physical Exam: ?GEN: NAD, alert and oriented x 3, chronically ill appearance, appears older than stated age, poor dentition, disheveled in appearance ?HEENT: NCAT, PERRL, EOMI, sclera clear, MMM ?PULM: CTAB w/o wheezes/crackles, normal respiratory effort, on room air ?CV: RRR w/o M/G/R ?GI: abd soft, NTND, NABS, no R/G/M ?MSK: no peripheral edema, muscle strength globally intact 5/5 bilateral upper/lower extremities ?NEURO: CN II-XII intact, no focal deficits, sensation to light touch intact ?PSYCH: normal mood/affect ?Integumentary: dry/intact, no rashes or wounds ? ? ? ?Data Reviewed: I have personally reviewed following labs  and imaging studies ? ?CBC: ?Recent Labs  ?Lab 01/19/22 ?1800 01/20/22 ?0420 01/21/22 ?0128  ?WBC 5.8 4.4 4.9  ?NEUTROABS 3.7  --  3.0  ?HGB 13.1 12.6* 12.7*  ?HCT 38.0* 35.8* 37.5*  ?MCV 93.4 93.0 94.9  ?PLT 191 181 175  ? ?Basic Metabolic Panel: ?Recent Labs  ?Lab 01/19/22 ?1803 01/19/22 ?2033 01/20/22 ?0420 01/20/22 ?1116 01/21/22 ?0128 01/22/22 ?9030 01/23/22 ?1333  ?NA  --  123* 126* 125* 129* 132* 132*  ?K  --  3.7 4.0 3.9 3.9 3.6 3.8  ?CL  --  90* 91* 92* 95* 98 97*  ?CO2  --  '22 24 23 25 26 27  '$ ?GLUCOSE  --  91 88 134* 93 91 89  ?BUN  --  '15 13 12 8 14 15  '$ ?CREATININE  --  1.06 1.01 0.88 0.85 1.23 0.93  ?CALCIUM  --  8.5* 8.8* 8.6* 9.0 8.8* 8.9  ?MG 2.0  --   --   --   --   --   --   ?PHOS  --  3.8  --   --   --   --   --   ? ?GFR: ?Estimated Creatinine Clearance: 84.9 mL/min (by C-G formula based on SCr of 0.93 mg/dL). ?Liver Function Tests: ?Recent Labs  ?Lab 01/19/22 ?2033  ?AST 19  ?ALT 19  ?ALKPHOS 37*  ?BILITOT 1.4*  ?PROT 6.0*  ?ALBUMIN 3.6  ? ?No results for input(s): LIPASE, AMYLASE in the last 168 hours. ?No results for input(s): AMMONIA in  the last 168 hours. ?Coagulation Profile: ?No results for input(s): INR, PROTIME in the last 168 hours. ?Cardiac Enzymes: ?No results for input(s): CKTOTAL, CKMB, CKMBINDEX, TROPONINI in the last 168 hours. ?BNP (last 3 results) ?Recent Labs  ?  11/27/21 ?1440  ?PROBNP 52.0  ? ?HbA1C: ?No results for input(s): HGBA1C in the last 72 hours. ?CBG: ?No results for input(s): GLUCAP in the last 168 hours. ?Lipid Profile: ?No results for input(s): CHOL, HDL, LDLCALC, TRIG, CHOLHDL, LDLDIRECT in the last 72 hours. ?Thyroid Function Tests: ?No results for input(s): TSH, T4TOTAL, FREET4, T3FREE, THYROIDAB in the last 72 hours. ?Anemia Panel: ?No results for input(s): VITAMINB12, FOLATE, FERRITIN, TIBC, IRON, RETICCTPCT in the last 72 hours. ?Sepsis Labs: ?No results for input(s): PROCALCITON, LATICACIDVEN in the last 168 hours. ? ?No results found for this or any previous  visit (from the past 240 hour(s)).  ? ? ? ? ? ?Radiology Studies: ?No results found. ? ? ? ? ? ?Scheduled Meds: ?  stroke: early stages of recovery book   Does not apply Once  ? arformoterol  15 mcg Nebulizatio

## 2022-01-25 DIAGNOSIS — E440 Moderate protein-calorie malnutrition: Secondary | ICD-10-CM | POA: Insufficient documentation

## 2022-01-25 DIAGNOSIS — R531 Weakness: Secondary | ICD-10-CM | POA: Diagnosis not present

## 2022-01-25 LAB — BASIC METABOLIC PANEL
Anion gap: 9 (ref 5–15)
BUN: 15 mg/dL (ref 8–23)
CO2: 24 mmol/L (ref 22–32)
Calcium: 8.8 mg/dL — ABNORMAL LOW (ref 8.9–10.3)
Chloride: 101 mmol/L (ref 98–111)
Creatinine, Ser: 0.81 mg/dL (ref 0.61–1.24)
GFR, Estimated: >60 mL/min (ref >60)
Glucose, Bld: 99 mg/dL (ref 70–99)
Potassium: 4.1 mmol/L (ref 3.5–5.1)
Sodium: 134 mmol/L — ABNORMAL LOW (ref 135–145)

## 2022-01-25 LAB — MAGNESIUM: Magnesium: 1.6 mg/dL — ABNORMAL LOW (ref 1.7–2.4)

## 2022-01-25 MED ORDER — SODIUM CHLORIDE 0.9 % IV BOLUS
1000.0000 mL | Freq: Once | INTRAVENOUS | Status: AC
  Administered 2022-01-25: 1000 mL via INTRAVENOUS

## 2022-01-25 MED ORDER — SODIUM CHLORIDE 0.9 % IV SOLN
INTRAVENOUS | Status: DC
Start: 2022-01-25 — End: 2022-01-26

## 2022-01-25 MED ORDER — MAGNESIUM SULFATE 4 GM/100ML IV SOLN
4.0000 g | Freq: Once | INTRAVENOUS | Status: AC
  Administered 2022-01-25: 4 g via INTRAVENOUS
  Filled 2022-01-25 (×2): qty 100

## 2022-01-25 MED ORDER — VITAMIN E 45 MG (100 UNIT) PO CAPS
400.0000 [IU] | ORAL_CAPSULE | Freq: Every day | ORAL | Status: DC
  Administered 2022-01-25 – 2022-01-26 (×2): 400 [IU] via ORAL
  Filled 2022-01-25 (×2): qty 4

## 2022-01-25 MED ORDER — VITAMIN D 25 MCG (1000 UNIT) PO TABS
1000.0000 [IU] | ORAL_TABLET | Freq: Every day | ORAL | Status: DC
  Administered 2022-01-25 – 2022-01-26 (×2): 1000 [IU] via ORAL
  Filled 2022-01-25 (×2): qty 1

## 2022-01-25 NOTE — TOC Initial Note (Addendum)
Transition of Care (TOC) - Initial/Assessment Note  ? ? ?Patient Details  ?Name: Jacob Frank ?MRN: [id number] ?Date of Birth: 1951/04/25 ? ?Transition of Care [facility]) CM/SW Contact:    ?Sharin Mons, RN ?Phone Number: ?01/25/2022, 7:36 AM ? ?Clinical Narrative:   ?Admitted with weakness and hyponatremia.   ?NCM spoke with pt regarding d/c planning. NCM shared potential need for home health services per PT/OT recommendations. Pt agreeable to services.            ?From boarding house. Pt states bad area and drug infested. NCM made referral with [facility] for PT and SW services, acceptance pending. Will need face to face from MD for home health services. ?DME need : cane, BSC. NCM made referral to [facility], equipment will be delivered to bedside prior to d/c. ?Pt states without transportation home.No funds for taxi. NCM to have CSW to assist with concern once d/c ready. ?Pt states local pharmacy will deliver Rx meds to his place of residence. ? ? ?TOC team following and will continue to assist with needs. ? ?01/25/2022 [facility] accepted pt for home health services. ? ?Expected Discharge Plan: [place] ?Barriers to Discharge: Continued Medical Work up ? ? ?Patient Goals and CMS Choice ?  ?  ?Choice offered to / list presented to : Patient ? ?Expected Discharge Plan and Services ?Expected Discharge Plan: [place] ?  ?Discharge Planning Services: CM Consult ?  ?  ?                ?  ?  ?  ?  ?  ?  ?  ?  ?  ?  ? ?Prior Living Arrangements/Services ?  ?Lives with:: Self ?Patient language and need for interpreter reviewed:: Yes ?Do you feel safe going back to the place where you live?: Yes      ?Need for Family Participation in Patient Care: Yes (Comment) ?Care giver support system in place?: No (comment) ?  ?Criminal Activity/Legal Involvement Pertinent to Current Situation/Hospitalization: No - Comment as needed ? ?Activities of Daily Living ?Home Assistive  Devices/Equipment: Cane (specify quad or straight) ?ADL Screening (condition at time of admission) ?Patient's cognitive ability adequate to safely complete daily activities?: Yes ?Is the patient deaf or have difficulty hearing?: Yes ?Does the patient have difficulty seeing, even when wearing glasses/contacts?: No ?Does the patient have difficulty concentrating, remembering, or making decisions?: No ?Patient able to express need for assistance with ADLs?: No ?Does the patient have difficulty dressing or bathing?: No ?Independently performs ADLs?: Yes (appropriate for developmental age) ?Does the patient have difficulty walking or climbing stairs?: Yes ?Weakness of Legs: Both ?Weakness of Arms/Hands: None ? ?Permission Sought/Granted ?  ?  ?   ?   ?   ?   ? ?Emotional Assessment ?Appearance:: Appears stated age ?  ?  ?Orientation: : Oriented to Self, Oriented to Place, Oriented to  Time, Oriented to Situation ?Alcohol / Substance Use: Not Applicable ?Psych Involvement: No (comment) ? ?Admission diagnosis:  ETOH abuse [F10.10] ?Weakness [R53.1] ?Generalized weakness [R53.1] ?Fall, initial encounter [W19.XXXA] ?Patient Active Problem List  ? Diagnosis Date Noted  ? Cerebral thrombosis with cerebral infarction 01/21/2022  ? Generalized weakness 01/20/2022  ? Weakness 01/19/2022  ? Alcohol use disorder, severe, dependence ([place])   ? Alcohol withdrawal syndrome with complication, with unspecified complication ([place])   ? BPH with urinary obstruction 11/24/2020  ? Unstable gait 06/24/2020  ? Imbalance 06/24/2020  ?  Benign prostatic hyperplasia with urinary obstruction 04/14/2020  ? Vitamin D deficiency 03/29/2020  ? Urinary retention 03/29/2020  ? Refeeding syndrome 03/27/2020  ? Hypomagnesemia 03/27/2020  ? Hypophosphatemia 03/27/2020  ? Acute renal failure (ARF) ([place]) 03/25/2020  ? Rhabdomyolysis 03/25/2020  ? Leukocytosis 03/25/2020  ? Chronic atrial fibrillation ([place]) 03/25/2020  ? Atrial fibrillation with RVR ([place])   ?  Syncope and collapse 04/24/2019  ? Alcohol abuse 04/24/2019  ? Anxiety and depression   ? Hyponatremia 12/12/2016  ? Alcohol abuse with intoxication ([place]) 12/12/2016  ? Syncope 12/12/2016  ? Elevated PSA 12/02/2013  ? Health maintenance examination 12/01/2013  ? Olecranon bursitis of right elbow 04/13/2013  ? GAD (generalized anxiety disorder) 03/04/2013  ? Prostate cancer screening 05/12/2012  ? Gross hematuria 04/23/2012  ? Prostatitis 04/23/2012  ? COPD (chronic obstructive pulmonary disease) ([place]) 02/06/2012  ? HTN (hypertension), benign 02/06/2012  ? Tobacco dependence 02/06/2012  ? COPD exacerbation ([place]) 02/06/2012  ? Vitamin B12 deficiency 02/06/2012  ? ?PCP:  [name], MD ?Pharmacy:   ?[facility], [address] ?[place] ?[place] [address] ?Phone: [phone] Fax: [phone] ? ?[facility] [place] ?[place] [address] ?Phone: [phone] Fax: [phone] ? ?[place] [address] ?Phone: [phone] Fax: [phone] ? ? ? ? ?Social Determinants of Health (SDOH) Interventions ?  ? ?Readmission Risk Interventions ? ?  12/28/2020  ? 12:55 PM  ?Readmission Risk Prevention Plan  ?Transportation Screening Complete  ?Home Care Screening Complete  ?Medication Review (RN CM) Complete  ? ? ? ?

## 2022-01-25 NOTE — Progress Notes (Signed)
Mobility Specialist Progress Note  ? ? 01/25/22 1114  ?Mobility  ?Activity Ambulated with assistance in hallway  ?Level of Assistance Contact guard assist, steadying assist  ?Assistive Device Front wheel walker  ?Distance Ambulated (ft) 250 ft ?(125+125)  ?Activity Response Tolerated fair  ?$Mobility charge 1 Mobility  ? ?Pre-Mobility:148/102 BP ?Post-Mobility: 145/103 BP ? ?Pt received sitting EOB and agreeable. C/o some dizziness. Took x1 seated rest break. Returned to sitting EOB with call bell in reach.  ? ?Jacob Frank ?Mobility Specialist  ?Primary: 5N M.S. Phone: [phone] ?Secondary: 6N M.S. Phone: [phone] ?  ?

## 2022-01-25 NOTE — Progress Notes (Signed)
Nutrition Follow-up ? ?DOCUMENTATION CODES:  ? ?Non-severe (moderate) malnutrition in context of social or environmental circumstances ? ?INTERVENTION:  ? ?Continue Ensure Enlive po TID, each supplement provides 350 kcal and 20 grams of protein. ? ?Continue Regular diet. ? ?Continue MVI with minerals daily, thiamine, and folic acid. ? ?Add vitamin D supplementation, 1,000 IU daily x 30 days, then re-check vitamin D level. ? ?Add vitamin E supplementation, 400 units daily x 7 days, then re-check level.  ? ? ?NUTRITION DIAGNOSIS:  ? ?Moderate Malnutrition related to social / environmental circumstances (alcohol abuse) as evidenced by mild muscle depletion, mild fat depletion. ? ?Ongoing  ? ?GOAL:  ? ?Patient will meet greater than or equal to 90% of their needs ? ?Progressing  ? ?MONITOR:  ? ?PO intake, Supplement acceptance, Labs, Weight trends ? ?REASON FOR ASSESSMENT:  ? ?Consult ?Assessment of nutrition requirement/status ? ?ASSESSMENT:  ? ?71 yo male admitted with at least 2 weeks of increasing weakness, now needing assistance even to walk, dizziness and ataxia secondary to hyponatremia and subacute ischemia within the left frontal white matter  PMH includes EtOH abuse, hepatic cirrhosis, hyponatremia CHF, COPD, cancer, depression with SI, reported hx of B12 def, tobacco dependence ? ?Patient states that he has been eating pretty good. He eats >50% of all meals and 100% of some meals. He is drinking Ensure supplements ~2-3 times per day.   ? ?Labs reviewed. Na 134, mag 1.6 ?Medications reviewed and include folic acid, MVI with minerals, thiamine. ?IVF: NS at 100 ml/h ? ?Vitamin/Mineral Panel ?Vitamin C 0.5 ?Copper 93 ?Vitamin A 36.8 ?Vitamin D 18.1 (L) ?Vitamin E (alpha-tocopherol) 8.1 (L) ?Vitamin B6 Pending ?CRP 1 ? ?NUTRITION - FOCUSED PHYSICAL EXAM: ? ?Flowsheet Row Most Recent Value  ?Orbital Region No depletion  ?Upper Arm Region No depletion  ?Thoracic and Lumbar Region Mild depletion  ?Buccal Region Mild  depletion  ?Temple Region Mild depletion  ?Clavicle Bone Region Mild depletion  ?Clavicle and Acromion Bone Region Mild depletion  ?Scapular Bone Region Mild depletion  ?Dorsal Hand No depletion  ?Patellar Region No depletion  ?Anterior Thigh Region No depletion  ?Posterior Calf Region No depletion  ?Edema (RD Assessment) None  ?Hair Reviewed  ?Eyes Reviewed  ?Mouth Reviewed  ?Skin Reviewed  ?Nails Reviewed  ? ?  ? ? ?Diet Order:   ?Diet Order   ? ?       ?  Diet regular Room service appropriate? Yes; Fluid consistency: Thin  Diet effective now       ?  ? ?  ?  ? ?  ? ? ?EDUCATION NEEDS:  ? ?Not appropriate for education at this time ? ?Skin:  Skin Assessment: Reviewed RN Assessment ? ?Last BM:  4/27 type 4 ? ?Height:  ? ?Ht Readings from Last 1 Encounters:  ?01/19/22 '5\' 9"'$  (1.753 m)  ? ? ?Weight:  ? ?Wt Readings from Last 1 Encounters:  ?01/25/22 82 kg  ? ? ?BMI:  Body mass index is 26.7 kg/m?. ? ?Estimated Nutritional Needs:  ? ?Kcal:  2000-2200 kcals ? ?Protein:  100-115 g ? ?Fluid:  >/= 2 L ? ? ? ?Lucas Mallow RD, LDN, CNSC ?Please refer to Amion for contact information.                                                       ? ?

## 2022-01-25 NOTE — Plan of Care (Signed)
?  Problem: Education: ?Goal: Knowledge of General Education information will improve ?Description: Including pain rating scale, medication(s)/side effects and non-pharmacologic comfort measures ?Outcome: Progressing ?  ?Problem: Health Behavior/Discharge Planning: ?Goal: Ability to manage health-related needs will improve ?Outcome: Progressing ?  ?Problem: Clinical Measurements: ?Goal: Ability to maintain clinical measurements within normal limits will improve ?Outcome: Progressing ?  ?Problem: Skin Integrity: ?Goal: Risk for impaired skin integrity will decrease ?Outcome: Progressing ?  ?Problem: Coping: ?Goal: Level of anxiety will decrease ?Outcome: Progressing ?  ?

## 2022-01-25 NOTE — Plan of Care (Signed)

## 2022-01-25 NOTE — Progress Notes (Signed)
PT Cancellation Note ? ?Patient Details ?Name: Jacob Frank ?MRN: [id number] ?DOB: 1950/12/08 ? ? ?Cancelled Treatment:    Reason Eval/Treat Not Completed: Patient declined, no reason specified, pt declining mobility x3 attempts throughout day. Will check back tomorrow to continue with PT POC. ? ? ? ?[name] ?01/25/2022, 4:06 PM ? ? ?

## 2022-01-25 NOTE — Progress Notes (Signed)
Occupational Therapy Treatment ?Patient Details ?Name: Jacob Frank ?MRN: [id number] ?DOB: Oct 29, 1950 ?Today's Date: 01/25/2022 ? ? ?History of present illness Pt is a 71 y/o male admitted secondary to worsening weakness and hyponatremia. PMH includes HTN, CHF, a fib, alcohol use and COPD. ?  ?OT comments ? Session focused on education of fall prevention strategies, safety strategies with orthostatic hypotension, and collaboration of the functional skills needed to complete ADL/IADL routine at home. Pt with continued drop in BP with transitional movements and reports concern over managing this at discharge. ? ?BP sitting EOB on entry: 126/92 ?BP standing: 107/78 ?BP after sitting : 122/88  ? ?Recommendations for follow up therapy are one component of a multi-disciplinary discharge planning process, led by the attending physician.  Recommendations may be updated based on patient status, additional functional criteria and insurance authorization. ?   ?Follow Up Recommendations ? Home health OT  ?  ?Assistance Recommended at Discharge Set up Supervision/Assistance  ?Patient can return home with the following ? A little help with walking and/or transfers;A little help with bathing/dressing/bathroom;Assistance with cooking/housework;Help with stairs or ramp for entrance;Assist for transportation ?  ?Equipment Recommendations ? None recommended by OT  ?  ?Recommendations for Other Services   ? ?  ?Precautions / Restrictions Precautions ?Precautions: Fall;Other (comment) ?Precaution Comments: hx of frequent falls ?Restrictions ?Weight Bearing Restrictions: No  ? ? ?  ? ?Mobility Bed Mobility ?  ?  ?  ?  ?  ?  ?  ?General bed mobility comments: sitting EOB upon arrival ?  ? ?Transfers ?Overall transfer level: Needs assistance ?Equipment used: Rolling walker (2 wheels) ?Transfers: Sit to/from Stand ?Sit to Stand: Supervision ?  ?  ?  ?  ?  ?General transfer comment: no assist needed, supervision for safety due to reported  dizziness ?  ?  ?Balance Overall balance assessment: Needs assistance ?Sitting-balance support: No upper extremity supported ?Sitting balance-Leahy Scale: Good ?  ?  ?Standing balance support: Single extremity supported ?Standing balance-Leahy Scale: Fair ?  ?  ?  ?  ?  ?  ?  ?  ?  ?  ?  ?  ?   ? ?ADL either performed or assessed with clinical judgement  ? ?ADL Overall ADL's : Needs assistance/impaired ?  ?  ?  ?  ?  ?  ?  ?  ?  ?  ?  ?  ?  ?  ?  ?  ?  ?  ?  ?General ADL Comments: Emphasis on safety precautions with orthostatic hypotension, ADL routine and setup at home, fall prevention strategies. Pt reports mostly staying in his boarding house room, uses urinal at night and bathroom closeby. declined use of BSC as shower chair, reports buying canned foods, other items he doesnt need to do much to prepare ?  ? ?Extremity/Trunk Assessment Upper Extremity Assessment ?Upper Extremity Assessment: Overall WFL for tasks assessed ?  ?Lower Extremity Assessment ?Lower Extremity Assessment: Defer to PT evaluation ?  ?  ?  ? ?Vision   ?Vision Assessment?: No apparent visual deficits ?  ?Perception   ?  ?Praxis   ?  ? ?Cognition Arousal/Alertness: Awake/alert ?Behavior During Therapy: [facility] for tasks assessed/performed ?Overall Cognitive Status: No family/caregiver present to determine baseline cognitive functioning ?  ?  ?  ?  ?  ?  ?  ?  ?  ?  ?  ?  ?  ?  ?  ?  ?General Comments: A&OX4, some decreased insight into  safety, higher level problem solving (med mgmt, etc) but likely at cognitive baseline with impacts due to etoh use ?  ?  ?   ?Exercises   ? ?  ?Shoulder Instructions   ? ? ?  ?General Comments BP noted with drop in standing, RN aware  ? ? ?Pertinent Vitals/ Pain       Pain Assessment ?Pain Assessment: No/denies pain ? ?Home Living   ?  ?  ?  ?  ?  ?  ?  ?  ?  ?  ?  ?  ?  ?  ?  ?  ?  ?  ? ?  ?Prior Functioning/Environment    ?  ?  ?  ?   ? ?Frequency ? Min 3X/week  ? ? ? ? ?  ?Progress Toward Goals ? ?OT  Goals(current goals can now be found in the care plan section) ? Progress towards OT goals: Progressing toward goals ? ?Acute Rehab OT Goals ?Patient Stated Goal: resolve dizziness issues ?OT Goal Formulation: With patient ?Time For Goal Achievement: 02/04/22 ?Potential to Achieve Goals: Good ?ADL Goals ?Pt Will Perform Upper Body Dressing: with modified independence;sitting;standing ?Pt Will Perform Lower Body Dressing: with modified independence;sitting/lateral leans;sit to/from stand ?Pt Will Transfer to Toilet: with modified independence;ambulating;regular height toilet ?Pt Will Perform Tub/Shower Transfer: Shower transfer;Tub transfer;ambulating;3 in 1;with modified independence  ?Plan Discharge plan remains appropriate;Frequency needs to be updated   ? ?Co-evaluation ? ? ?   ?  ?  ?  ?  ? ?  ?AM-PAC OT "6 Clicks" Daily Activity     ?Outcome Measure ? ? Help from another person eating meals?: None ?Help from another person taking care of personal grooming?: A Little ?Help from another person toileting, which includes using toliet, bedpan, or urinal?: A Little ?Help from another person bathing (including washing, rinsing, drying)?: A Little ?Help from another person to put on and taking off regular upper body clothing?: A Little ?Help from another person to put on and taking off regular lower body clothing?: A Little ?6 Click Score: 19 ? ?  ?End of Session Equipment Utilized During Treatment: Rolling walker (2 wheels) ? ?OT Visit Diagnosis: Unsteadiness on feet (R26.81);Other abnormalities of gait and mobility (R26.89);Muscle weakness (generalized) (M62.81) ?  ?Activity Tolerance Patient tolerated treatment well ?  ?Patient Left in bed;with call bell/phone within reach ?  ?Nurse Communication Mobility status ?  ? ?   ? ?Time: 5409-8119 ?OT Time Calculation (min): 20 min ? ?Charges: OT General Charges ?$OT Visit: 1 Visit ?OT Treatments ?$Therapeutic Activity: 8-22 mins ? ?Malachy Chamber, OTR/L ?Acute Rehab  Services ?Office: [phone]  ? ?Jacob Frank ?01/25/2022, 2:34 PM ?

## 2022-01-25 NOTE — Progress Notes (Addendum)
?PROGRESS NOTE ? ? ? ?Jacob Frank  CHE:[id number] DOB: June 14, 1951 DOA: 01/19/2022 ?PCP: [name], MD  ? ? ?Brief Narrative:  ? ?Jacob Frank is a 71 year old male with past medical history significant for EtOH abuse, paroxysmal atrial fibrillation, COPD, HTN, B12 deficiency, anxiety, BPH who presented to [facility] ED on 4/21 from his boarding home with weakness and dizziness.  Patient reports onset over the last 2 weeks, now with needing assistance to even walk.  He reports decreased oral intake for at least several weeks per PCP note.  Current lives in a boardinghouse, and reports compliance with his home medications.  Denies fever/chills/chest pain, shortness of breath, abdominal pain, constipation, diarrhea, nausea/vomiting. ? ?In the ED, heart rate initially in the 110s, improved to the 90s after IV fluid bolus.  Sodium 121, chloride 87, magnesium level normal.  CBC within normal limits.  EtOH level less than 10.  CT head without acute intracranial abnormality.  Patient was given IV fluid bolus in addition to folate and thiamine.  Hospital service consulted for further evaluation and management of hyponatremia in the setting of EtOH abuse and weakness/dizziness. ? ? ?Assessment & Plan: ?  ?Hyponatremia ?Patient was found to have low sodium of 121 on admission.  Serum osmolality 254, urine osmolality 322, urine sodium less than 10.  Etiology likely secondary to chronic alcohol abuse.  Patient was started IV fluid hydration with improvement of sodium to 134. ?--Continue IV fluid hydration ?--Counseled on need for complete EtOH cessation ?--BMP in a.m. ? ?Hypomagnesemia ?Magnesium 1.6, will replete. ?--Repeat magnesium level in a.m. ? ?Generalized weakness ?Orthostatic hypotension ?Patient presenting with generalized weakness, gait disturbance.  Etiology likely multifactorial in the setting of autonomic dysfunction in the view of chronic alcohol abuse and deconditioning with poor oral intake. ?--Home metoprolol  discontinued ?--Midodrine 10 mg p.o. 3 times daily ?--TED hose ?--IV fluid hydration ?--Orthostatics daily ?--Continue PT/OT efforts ? ?Paroxysmal atrial fibrillation ?Currently in normal sinus rhythm.  Metoprolol discontinued due to orthostatic hypotension.  Not on chronic anticoagulation due to recurrent falls and continued alcohol abuse. ?--Monitor on telemetry ? ?Chronic alcohol abuse ?EtOH level less than 10 on admission.  Monitor on CIWA for withdrawal symptoms, no significant withdrawal symptoms noted during hospitalization. ?--Thiamine, folic acid, multivitamin ?--Counsel need for complete cessation ?--TOC for subs abuse resources ? ?Head imaging abnormality likely secondary to artifact ?CT head without contrast with no acute abnormality.  MR brain with small focus of subacute ischemia within the left frontal white matter; seen by neurology and doubt small lacunar infarct in the left frontal white matter and cyst suspicious for artifact.  MRA head/neck unrevealing.  TTE with LVEF 53-6%, grade 1 diastolic dysfunction, redundancy of atrial septum.  Carotid Dopplers extracranial vessels near normal with minimal plaque, antegrade flow bilateral vertebral arteries.  LDL 61, hemoglobin A1c 4.9.  No further work-up per neurology, Dr. Leonie Man. ? ?Anxiety/depression ?--Wellbutrin 300 mg p.o. daily ?--Seroquel 400 mg p.o. twice daily ? ?COPD ?--Brovana neb twice daily ?--Incruse Ellipta 1 puff daily ? ?Physical deconditioning ?--PT and OT following, recommending home health ?--May be an issue as patient lives in a boardinghouse per case management ?--Continue therapy efforts while inpatient ? ? ?DVT prophylaxis: enoxaparin (LOVENOX) injection 40 mg Start: 01/19/22 2045 ? ?  Code Status: Full Code ?Family Communication: No family present at bedside this morning ? ?Disposition Plan:  ?Level of care: Med-Surg ?Status is: Inpatient ?Remains inpatient appropriate because: Remains orthostatic, repeating IV fluid bolus and  continuous  IV fluid infusion today, from boardinghouse, social work to assess if able to order home health on discharge ?  ? ?Consultants:  ?Neurology ? ?Procedures:  ?TTE ?Carotid duplex ultrasound ? ?Antimicrobials:  ?None ? ? ?Subjective: ?Patient seen examined bedside, sitting on edge of bed.  Patient continues to complain of dizziness.  Repeat orthostatic vital signs remain positive this morning, although improved.  We will repeat IV fluid bolus followed by continuous infusion today.  Continues to work with PT/OT, able to ambulate 250 feet with a walker today.  Discussed with social work/case management, may be difficult to obtain home health at his boardinghouse, they are awaiting acceptance.  No other specific complaints or concerns at this time.  Denies headache, no chest pain, palpitations, no abdominal pain, no fever/chills/night sweats, no nausea/vomiting/diarrhea, no fatigue, no paresthesias.  No acute events overnight per nursing staff. ? ?Objective: ?Vitals:  ? 01/24/22 2031 01/24/22 2034 01/24/22 2037 01/25/22 0500  ?BP: (!) 157/104 (!) 154/105    ?Pulse: 78 79    ?Resp:      ?Temp:      ?TempSrc:      ?SpO2:   98%   ?Weight:    82 kg  ?Height:      ? ? ?Intake/Output Summary (Last 24 hours) at 01/25/2022 1154 ?Last data filed at 01/25/2022 0300 ?Gross per 24 hour  ?Intake 1046.53 ml  ?Output 2250 ml  ?Net -1203.47 ml  ? ?Filed Weights  ? 01/19/22 1658 01/25/22 0500  ?Weight: 94.3 kg 82 kg  ? ? ?Examination: ? ?Physical Exam: ?GEN: NAD, alert and oriented x 3, chronically ill appearance, appears older than stated age, poor dentition, disheveled in appearance ?HEENT: NCAT, PERRL, EOMI, sclera clear, MMM ?PULM: CTAB w/o wheezes/crackles, normal respiratory effort, on room air ?CV: RRR w/o M/G/R ?GI: abd soft, NTND, NABS, no R/G/M ?MSK: no peripheral edema, muscle strength globally intact 5/5 bilateral upper/lower extremities ?NEURO: CN II-XII intact, no focal deficits, sensation to light touch  intact ?PSYCH: normal mood/affect ?Integumentary: dry/intact, no rashes or wounds ? ? ? ?Data Reviewed: I have personally reviewed following labs and imaging studies ? ?CBC: ?Recent Labs  ?Lab 01/19/22 ?1800 01/20/22 ?0420 01/21/22 ?0128  ?WBC 5.8 4.4 4.9  ?NEUTROABS 3.7  --  3.0  ?HGB 13.1 12.6* 12.7*  ?HCT 38.0* 35.8* 37.5*  ?MCV 93.4 93.0 94.9  ?PLT 191 181 175  ? ?Basic Metabolic Panel: ?Recent Labs  ?Lab 01/19/22 ?1803 01/19/22 ?2033 01/20/22 ?0420 01/20/22 ?1116 01/21/22 ?0128 01/22/22 ?4696 01/23/22 ?1333 01/25/22 ?0424  ?NA  --  123*   < > 125* 129* 132* 132* 134*  ?K  --  3.7   < > 3.9 3.9 3.6 3.8 4.1  ?CL  --  90*   < > 92* 95* 98 97* 101  ?CO2  --  22   < > '23 25 26 27 24  '$ ?GLUCOSE  --  91   < > 134* 93 91 89 99  ?BUN  --  15   < > '12 8 14 15 15  '$ ?CREATININE  --  1.06   < > 0.88 0.85 1.23 0.93 0.81  ?CALCIUM  --  8.5*   < > 8.6* 9.0 8.8* 8.9 8.8*  ?MG 2.0  --   --   --   --   --   --  1.6*  ?PHOS  --  3.8  --   --   --   --   --   --   ? < > =  values in this interval not displayed.  ? ?GFR: ?Estimated Creatinine Clearance: 86.1 mL/min (by C-G formula based on SCr of 0.81 mg/dL). ?Liver Function Tests: ?Recent Labs  ?Lab 01/19/22 ?2033  ?AST 19  ?ALT 19  ?ALKPHOS 37*  ?BILITOT 1.4*  ?PROT 6.0*  ?ALBUMIN 3.6  ? ?No results for input(s): LIPASE, AMYLASE in the last 168 hours. ?No results for input(s): AMMONIA in the last 168 hours. ?Coagulation Profile: ?No results for input(s): INR, PROTIME in the last 168 hours. ?Cardiac Enzymes: ?No results for input(s): CKTOTAL, CKMB, CKMBINDEX, TROPONINI in the last 168 hours. ?BNP (last 3 results) ?Recent Labs  ?  11/27/21 ?1440  ?PROBNP 52.0  ? ?HbA1C: ?No results for input(s): HGBA1C in the last 72 hours. ?CBG: ?No results for input(s): GLUCAP in the last 168 hours. ?Lipid Profile: ?No results for input(s): CHOL, HDL, LDLCALC, TRIG, CHOLHDL, LDLDIRECT in the last 72 hours. ?Thyroid Function Tests: ?No results for input(s): TSH, T4TOTAL, FREET4, T3FREE, THYROIDAB in  the last 72 hours. ?Anemia Panel: ?No results for input(s): VITAMINB12, FOLATE, FERRITIN, TIBC, IRON, RETICCTPCT in the last 72 hours. ?Sepsis Labs: ?No results for input(s): PROCALCITON, LATICACIDVEN in the last 168 h

## 2022-01-26 LAB — BASIC METABOLIC PANEL
Anion gap: 9 (ref 5–15)
BUN: 13 mg/dL (ref 8–23)
CO2: 21 mmol/L — ABNORMAL LOW (ref 22–32)
Calcium: 8.3 mg/dL — ABNORMAL LOW (ref 8.9–10.3)
Chloride: 100 mmol/L (ref 98–111)
Creatinine, Ser: 0.84 mg/dL (ref 0.61–1.24)
GFR, Estimated: >60 mL/min (ref >60)
Glucose, Bld: 85 mg/dL (ref 70–99)
Potassium: 4.8 mmol/L (ref 3.5–5.1)
Sodium: 130 mmol/L — ABNORMAL LOW (ref 135–145)

## 2022-01-26 LAB — VITAMIN B6: Vitamin B6: 16.5 ug/L (ref 3.4–65.2)

## 2022-01-26 LAB — MAGNESIUM: Magnesium: 2.1 mg/dL (ref 1.7–2.4)

## 2022-01-26 MED ORDER — MIDODRINE HCL 10 MG PO TABS: 10.0000 mg | ORAL_TABLET | Freq: Three times a day (TID) | ORAL | 2 refills | Status: DC

## 2022-01-26 NOTE — TOC Progression Note (Signed)
Transition of Care (TOC) - Progression Note  ? ? ?Patient Details  ?Name: Jacob Frank ?MRN: [id number] ?Date of Birth: January 28, 1951 ? ?Transition of Care (TOC) CM/SW Contact  ?Joanne Chars, LCSW ?Phone Number: ?01/26/2022, 11:57 AM ? ?Clinical Narrative:   CSW contacted for transportation assistance--pt is from [place].  Per pt, he has no one to pick him up, no transportation options.  CSW contacted supervisor Barbette Or regarding this cab ride, told to move forward.  ? ? ? ?Expected Discharge Plan: [place] ?Barriers to Discharge: No Barriers Identified ? ?Expected Discharge Plan and Services ?Expected Discharge Plan: [place] ?  ?Discharge Planning Services: CM Consult ?  ?  ?Expected Discharge Date: 01/26/22               ?  ?  ?  ?  ?  ?HH Arranged: PT, Social Work ?[place] Agency: [place] ([place]) ?Date HH Agency Contacted: 01/26/22 ?Time [place]: [phone] ?Representative spoke with at [place]: [place] ? ? ?Social Determinants of Health (SDOH) Interventions ?  ? ?Readmission Risk Interventions ? ?  12/28/2020  ? 12:55 PM  ?Readmission Risk Prevention Plan  ?Transportation Screening Complete  ?Home Care Screening Complete  ?Medication Review (RN CM) Complete  ? ? ?

## 2022-01-26 NOTE — Discharge Summary (Signed)
?Physician Discharge Summary  ?[name] XIP:[id number] DOB: 01-21-1951 DOA: 01/19/2022 ? ?PCP: [name], MD ? ?Admit date: 01/19/2022 ?Discharge date: 01/26/2022 ? ?Admitted From: Boardinghouse ?Disposition: Boardinghouse ? ?Recommendations for Outpatient Follow-up:  ?Follow up with PCP in 1-2 weeks ?Discontinue metoprolol, amlodipine, furosemide due to orthostatic hypotension ?Started on midodrine ?Continue encourage alcohol cessation, likely etiology for patient's hospitalization ?Please obtain BMP/CBC in one week ? ?Home Health: PT/social work ?Equipment/Devices: None ? ?Discharge Condition: Stable ?CODE STATUS: Full code ?Diet recommendation: Heart healthy diet ? ?History of present illness: ? ?Jacob Frank is a 71 year old male with past medical history significant for EtOH abuse, paroxysmal atrial fibrillation, COPD, HTN, B12 deficiency, anxiety, BPH who presented to [facility] ED on 4/21 from his boarding home with weakness and dizziness.  Patient reports onset over the last 2 weeks, now with needing assistance to even walk.  He reports decreased oral intake for at least several weeks per PCP note.  Current lives in a boardinghouse, and reports compliance with his home medications.  Denies fever/chills/chest pain, shortness of breath, abdominal pain, constipation, diarrhea, nausea/vomiting. ?  ?In the ED, heart rate initially in the 110s, improved to the 90s after IV fluid bolus.  Sodium 121, chloride 87, magnesium level normal.  CBC within normal limits.  EtOH level less than 10.  CT head without acute intracranial abnormality.  Patient was given IV fluid bolus in addition to folate and thiamine.  Hospital service consulted for further evaluation and management of hyponatremia in the setting of EtOH abuse and weakness/dizziness. ? ?Hospital course: ? ?Hyponatremia ?Patient was found to have low sodium of 121 on admission.  Serum osmolality 254, urine osmolality 322, urine sodium less than 10.  Etiology  likely secondary to chronic alcohol abuse.  Patient was started IV fluid hydration with improvement of sodium to 130 at time of discharge.  Recommend repeat BMP 1 week.  Continue to encourage EtOH cessation as likely etiology to his hyponatremia. ?  ?Hypomagnesemia ?Repleted during hospitalization. ?  ?Generalized weakness ?Orthostatic hypotension ?Patient presenting with generalized weakness, gait disturbance.  Etiology likely multifactorial in the setting of autonomic dysfunction in the view of chronic alcohol abuse and deconditioning with poor oral intake.  Home metoprolol, furosemide, amlodipine discontinued.  Started on midodrine 10 mg p.o. 3 times daily and supported with IV fluid hydration with improvement of orthostasis.  Recommend continue TED hose.  Alcohol cessation.  Discharged with home health PT. ?  ?Paroxysmal atrial fibrillation ?Currently in normal sinus rhythm.  Metoprolol discontinued due to orthostatic hypotension.  Not on chronic anticoagulation due to recurrent falls and continued alcohol abuse.  Outpatient follow-up with PCP. ?  ?Chronic alcohol abuse ?EtOH level less than 10 on admission.  Monitor on CIWA for withdrawal symptoms, no significant withdrawal symptoms noted during hospitalization.  Continue thiamine, folic acid, multivitamin.  Seen by transitions of care and given resources for substance abuse.  Continue to encourage alcohol cessation. ?  ?Head imaging abnormality likely secondary to artifact ?CT head without contrast with no acute abnormality.  MR brain with small focus of subacute ischemia within the left frontal white matter; seen by neurology and doubt small lacunar infarct in the left frontal white matter and cyst suspicious for artifact.  MRA head/neck unrevealing.  TTE with LVEF 67-3%, grade 1 diastolic dysfunction, redundancy of atrial septum.  Carotid Dopplers extracranial vessels near normal with minimal plaque, antegrade flow bilateral vertebral arteries.  LDL 61,  hemoglobin A1c 4.9.  No further work-up  per neurology, Dr. Leonie Man. ?  ?Anxiety/depression ?Wellbutrin 300 mg p.o. daily, Seroquel 400 mg p.o. twice daily ?  ?COPD ?Continue home Bevespi inhaler and nebs as needed ?  ?Physical deconditioning ?Discharging with home health PT. ? ? ? ? ? ?Discharge Diagnoses:  ?Principal Problem: ?  Weakness ?Active Problems: ?  COPD (chronic obstructive pulmonary disease) ([place]) ?  HTN (hypertension), benign ?  Vitamin B12 deficiency ?  GAD (generalized anxiety disorder) ?  Hyponatremia ?  Alcohol abuse ?  Chronic atrial fibrillation (H[facility]) ?  Benign prostatic hyperplasia with urinary obstruction ?  Generalized weakness ?  Cerebral thrombosis with cerebral infarction ?  Malnutrition of moderate degree ? ? ? ?Discharge Instructions ? ?Discharge Instructions   ? ? Call MD for:  difficulty breathing, headache or visual disturbances   Complete by: As directed ?  ? Call MD for:  extreme fatigue   Complete by: As directed ?  ? Call MD for:  persistant dizziness or light-headedness   Complete by: As directed ?  ? Call MD for:  persistant nausea and vomiting   Complete by: As directed ?  ? Call MD for:  severe uncontrolled pain   Complete by: As directed ?  ? Call MD for:  temperature >100.4   Complete by: As directed ?  ? Diet - low sodium heart healthy   Complete by: As directed ?  ? Increase activity slowly   Complete by: As directed ?  ? ?  ? ?Allergies as of 01/26/2022   ? ?   Reactions  ? Citalopram Other (See Comments)  ? Question of whether it was making him feel like his throat was "closed up".  ? ?  ? ?  ?Medication List  ?  ? ?STOP taking these medications   ? ?amLODipine 10 MG tablet ?Commonly known as: NORVASC ?  ?furosemide 20 MG tablet ?Commonly known as: LASIX ?  ?metoprolol succinate 100 MG 24 hr tablet ?Commonly known as: TOPROL-XL ?  ?potassium chloride SA 20 MEQ tablet ?Commonly known as: KLOR-CON M ?  ?SYRINGE-NEEDLE (DISP) 3 ML 25G X 5/8" 3 ML Misc ?  ? ?  ? ?TAKE these  medications   ? ?albuterol (2.5 MG/3ML) 0.083% nebulizer solution ?Commonly known as: PROVENTIL ?INHALE 1 VIAL VIA NEBULIZER EVERY 6 HOURS AS NEEDED FOR WHEEZING OR SHORTNESS OF BREATH. ?What changed:  ?how much to take ?how to take this ?when to take this ?reasons to take this ?  ?albuterol 108 (90 Base) MCG/ACT inhaler ?Commonly known as: ProAir HFA ?2 PUFFS EVERY FOUR HOURS AS NEEDED FOR WHEEZING ?What changed:  ?how much to take ?when to take this ?reasons to take this ?  ?aspirin EC 81 MG tablet ?Take 81 mg by mouth daily. Swallow whole. ?  ?Bevespi Aerosphere 9-4.8 MCG/ACT Aero ?Generic drug: Glycopyrrolate-Formoterol ?2 puffs bid ?  ?buPROPion 300 MG 24 hr tablet ?Commonly known as: WELLBUTRIN XL ?TAKE (1) TABLET BY MOUTH ONCE DAILY. ?What changed:  ?how much to take ?how to take this ?when to take this ?  ?clotrimazole 1 % cream ?Commonly known as: LOTRIMIN ?Apply topically 2 (two) times daily. ?  ?Compressor/Nebulizer Misc ?1 unit dose of albuterol via nebulizer q4h prn wheezing and shortness of breath. ?  ?cyanocobalamin 1000 MCG/ML injection ?Commonly known as: (VITAMIN B-12) ?Inject 1 mL (1,000 mcg total) into the muscle every 30 (thirty) days. ?  ?folic acid 1 MG tablet ?Commonly known as: FOLVITE ?Take 1 tablet (1 mg total) by mouth  daily. ?  ?ibuprofen 800 MG tablet ?Commonly known as: ADVIL ?1 tab po qd prn joint pain ?  ?midodrine 10 MG tablet ?Commonly known as: PROAMATINE ?Take 1 tablet (10 mg total) by mouth 3 (three) times daily with meals. ?  ?nystatin cream ?Commonly known as: MYCOSTATIN ?Apply to groin rash 3 times per day as needed ?  ?pantoprazole 40 MG tablet ?Commonly known as: PROTONIX ?TAKE (1) TABLET BY MOUTH ONCE DAILY. ?What changed: See the new instructions. ?  ?QUEtiapine 400 MG tablet ?Commonly known as: SEROQUEL ?TAKE 1 TABLET BY MOUTH TWICE DAILY. ?What changed:  ?how much to take ?how to take this ?when to take this ?  ?thiamine 100 MG tablet ?Take 1 tablet (100 mg total) by  mouth daily. ?  ? ?  ? ?  ?  ? ? ?  ?Durable Medical Equipment  ?(From admission, onward)  ?  ? ? ?  ? ?  Start     Ordered  ? 01/25/22 0759  For home use only DME [facility]  Once       ? 01/25/22 0759  ? 01/24/22 1502  F

## 2022-01-26 NOTE — Progress Notes (Signed)
Mobility Specialist Progress Note  ? ? 01/26/22 1028  ?Mobility  ?Activity Ambulated with assistance in hallway  ?Level of Assistance Contact guard assist, steadying assist  ?Assistive Device Front wheel walker  ?Distance Ambulated (ft) 150 ft ?(75+75)  ?Activity Response Tolerated fair  ?$Mobility charge 1 Mobility  ? ?Post-Mobility: 115/81 BP ? ?Pt received sitting EOB and agreeable. C/o dizziness and blurred vision. Took x1 seated rest break. Returned to sitting EOB with call bell in reach.  ? ?Hildred Alamin ?Mobility Specialist  ?Primary: 5N M.S. Phone: [phone] ?Secondary: 6N M.S. Phone: [phone] ?  ?

## 2022-01-26 NOTE — TOC Initial Note (Signed)
Transition of Care (TOC) - Initial/Assessment Note  ? ? ?Patient Details  ?Name: Jacob Frank ?MRN: [id number] ?Date of Birth: 06-May-1951 ? ?Transition of Care [facility]) CM/SW Contact:    ?Sharin Mons, RN ?Phone Number: ?01/26/2022, 11:26 AM ? ?Clinical Narrative:                 ?Patient will DC to: home ?Anticipated DC date: 01/26/2022 ?Transport by: [facility] ? ? ?Per MD patient ready for DC today. RN, patient, and [place] notified of DC.  ?Post hospital f/u noted on AVS. ?Pt without Rx med concerns. States local pharmacy will deliver Rx meds to residence. ?Pt with transportation issue to get home. CSW to discussion with [facility] leadership regarding taxi voucher to [place], [place]. ?DME : cane and BSC delivered to bedside by [employer]. Pt declined BSC. ? ?  ?RNCM will sign off for now as intervention is no longer needed. Please consult [place] again if new needs arise.  ? ?Expected Discharge Plan: [place] ?Barriers to Discharge: No Barriers Identified ? ? ?Patient Goals and CMS Choice ?  ?  ?Choice offered to / list presented to : Patient ? ?Expected Discharge Plan and Services ?Expected Discharge Plan: [place] ?  ?Discharge Planning Services: CM Consult ?  ?  ?Expected Discharge Date: 01/26/22               ?  ?  ?  ?  ?  ?HH Arranged: PT, Social Work ?[place] Agency: [place] ([place]) ?Date HH Agency Contacted: 01/26/22 ?Time [place]: 2952 ?Representative spoke with at [place]: [place] ? ?Prior Living Arrangements/Services ?  ?Lives with:: Self ?Patient language and need for interpreter reviewed:: Yes ?Do you feel safe going back to the place where you live?: Yes      ?Need for Family Participation in Patient Care: Yes (Comment) ?Care giver support system in place?: No (comment) ?  ?Criminal Activity/Legal Involvement Pertinent to Current Situation/Hospitalization: No - Comment as needed ? ?Activities of Daily Living ?Home Assistive Devices/Equipment:  Cane (specify quad or straight) ?ADL Screening (condition at time of admission) ?Patient's cognitive ability adequate to safely complete daily activities?: Yes ?Is the patient deaf or have difficulty hearing?: Yes ?Does the patient have difficulty seeing, even when wearing glasses/contacts?: No ?Does the patient have difficulty concentrating, remembering, or making decisions?: No ?Patient able to express need for assistance with ADLs?: No ?Does the patient have difficulty dressing or bathing?: No ?Independently performs ADLs?: Yes (appropriate for developmental age) ?Does the patient have difficulty walking or climbing stairs?: Yes ?Weakness of Legs: Both ?Weakness of Arms/Hands: None ? ?Permission Sought/Granted ?  ?  ?   ?   ?   ?   ? ?Emotional Assessment ?Appearance:: Appears stated age ?  ?  ?Orientation: : Oriented to Self, Oriented to Place, Oriented to  Time, Oriented to Situation ?Alcohol / Substance Use: Not Applicable ?Psych Involvement: No (comment) ? ?Admission diagnosis:  ETOH abuse [F10.10] ?Weakness [R53.1] ?Generalized weakness [R53.1] ?Fall, initial encounter [W19.XXXA] ?Patient Active Problem List  ? Diagnosis Date Noted  ? Malnutrition of moderate degree 01/25/2022  ? Cerebral thrombosis with cerebral infarction 01/21/2022  ? Generalized weakness 01/20/2022  ? Weakness 01/19/2022  ? Alcohol use disorder, severe, dependence ([place])   ? Alcohol withdrawal syndrome with complication, with unspecified complication ([place])   ? BPH with urinary obstruction 11/24/2020  ? Unstable gait 06/24/2020  ? Imbalance 06/24/2020  ?  Benign prostatic hyperplasia with urinary obstruction 04/14/2020  ? Vitamin D deficiency 03/29/2020  ? Urinary retention 03/29/2020  ? Refeeding syndrome 03/27/2020  ? Hypomagnesemia 03/27/2020  ? Hypophosphatemia 03/27/2020  ? Acute renal failure (ARF) ([place]) 03/25/2020  ? Rhabdomyolysis 03/25/2020  ? Leukocytosis 03/25/2020  ? Chronic atrial fibrillation ([place]) 03/25/2020  ? Atrial  fibrillation with RVR ([place])   ? Syncope and collapse 04/24/2019  ? Alcohol abuse 04/24/2019  ? Anxiety and depression   ? Hyponatremia 12/12/2016  ? Alcohol abuse with intoxication ([place]) 12/12/2016  ? Syncope 12/12/2016  ? Elevated PSA 12/02/2013  ? Health maintenance examination 12/01/2013  ? Olecranon bursitis of right elbow 04/13/2013  ? GAD (generalized anxiety disorder) 03/04/2013  ? Prostate cancer screening 05/12/2012  ? Gross hematuria 04/23/2012  ? Prostatitis 04/23/2012  ? COPD (chronic obstructive pulmonary disease) ([place]) 02/06/2012  ? HTN (hypertension), benign 02/06/2012  ? Tobacco dependence 02/06/2012  ? COPD exacerbation ([place]) 02/06/2012  ? Vitamin B12 deficiency 02/06/2012  ? ?PCP:  [name], MD ?Pharmacy:   ?[place] ?[place] [address] ?Phone: [phone] Fax: [phone] ? ?[place] [place] ?[place] [address] ?Phone: [phone] Fax: [phone] ? ?[place] [address] ?Phone: [phone] Fax: [phone] ? ? ? ? ?Social Determinants of Health (SDOH) Interventions ?  ? ?Readmission Risk Interventions ? ?  12/28/2020  ? 12:55 PM  ?Readmission Risk Prevention Plan  ?Transportation Screening Complete  ?Home Care Screening Complete  ?Medication Review (RN CM) Complete  ? ? ? ?

## 2022-01-31 DIAGNOSIS — F32A Depression, unspecified: Secondary | ICD-10-CM | POA: Diagnosis not present

## 2022-01-31 DIAGNOSIS — F17210 Nicotine dependence, cigarettes, uncomplicated: Secondary | ICD-10-CM | POA: Diagnosis not present

## 2022-01-31 DIAGNOSIS — I951 Orthostatic hypotension: Secondary | ICD-10-CM | POA: Diagnosis not present

## 2022-01-31 DIAGNOSIS — I509 Heart failure, unspecified: Secondary | ICD-10-CM | POA: Diagnosis not present

## 2022-01-31 DIAGNOSIS — J449 Chronic obstructive pulmonary disease, unspecified: Secondary | ICD-10-CM | POA: Diagnosis not present

## 2022-01-31 DIAGNOSIS — I480 Paroxysmal atrial fibrillation: Secondary | ICD-10-CM | POA: Diagnosis not present

## 2022-01-31 DIAGNOSIS — Z9181 History of falling: Secondary | ICD-10-CM | POA: Diagnosis not present

## 2022-01-31 DIAGNOSIS — M159 Polyosteoarthritis, unspecified: Secondary | ICD-10-CM | POA: Diagnosis not present

## 2022-01-31 DIAGNOSIS — H905 Unspecified sensorineural hearing loss: Secondary | ICD-10-CM | POA: Diagnosis not present

## 2022-01-31 DIAGNOSIS — E871 Hypo-osmolality and hyponatremia: Secondary | ICD-10-CM | POA: Diagnosis not present

## 2022-01-31 DIAGNOSIS — I110 Hypertensive heart disease with heart failure: Secondary | ICD-10-CM | POA: Diagnosis not present

## 2022-01-31 DIAGNOSIS — K7460 Unspecified cirrhosis of liver: Secondary | ICD-10-CM | POA: Diagnosis not present

## 2022-01-31 DIAGNOSIS — Z8673 Personal history of transient ischemic attack (TIA), and cerebral infarction without residual deficits: Secondary | ICD-10-CM | POA: Diagnosis not present

## 2022-01-31 DIAGNOSIS — R2681 Unsteadiness on feet: Secondary | ICD-10-CM | POA: Diagnosis not present

## 2022-01-31 DIAGNOSIS — R338 Other retention of urine: Secondary | ICD-10-CM | POA: Diagnosis not present

## 2022-01-31 DIAGNOSIS — E538 Deficiency of other specified B group vitamins: Secondary | ICD-10-CM | POA: Diagnosis not present

## 2022-01-31 DIAGNOSIS — Z7982 Long term (current) use of aspirin: Secondary | ICD-10-CM | POA: Diagnosis not present

## 2022-02-01 ENCOUNTER — Encounter: Payer: Medicare Other | Admitting: Family Medicine

## 2022-02-01 ENCOUNTER — Encounter: Payer: Self-pay | Admitting: Family Medicine

## 2022-02-01 NOTE — Progress Notes (Signed)
This encounter was created in error - please disregard.

## 2022-02-02 ENCOUNTER — Telehealth: Payer: Self-pay

## 2022-02-02 NOTE — Telephone Encounter (Signed)
Patient stated he missed his virtual appt yesterday because he just feels awful.  He is requesting to see Dr. Anitra Lauth on Monday, I told patient provider is out of office all next week. ?He is wanting to know what to do. ?He stated he hasnt been able to use the bathroom in about 2 weeks.  ?Please advise.  Patient will answer the phone, please call [phone] ?

## 2022-02-02 NOTE — Telephone Encounter (Signed)
LM for pt regarding message below. Is pt having issues with urinating or bowel movements?  ?

## 2022-02-05 NOTE — Telephone Encounter (Signed)
LVM for pt to CB regarding prior message.  ?

## 2022-02-06 DIAGNOSIS — F17210 Nicotine dependence, cigarettes, uncomplicated: Secondary | ICD-10-CM | POA: Diagnosis not present

## 2022-02-06 DIAGNOSIS — K7460 Unspecified cirrhosis of liver: Secondary | ICD-10-CM | POA: Diagnosis not present

## 2022-02-06 DIAGNOSIS — E538 Deficiency of other specified B group vitamins: Secondary | ICD-10-CM | POA: Diagnosis not present

## 2022-02-06 DIAGNOSIS — E871 Hypo-osmolality and hyponatremia: Secondary | ICD-10-CM | POA: Diagnosis not present

## 2022-02-06 DIAGNOSIS — I509 Heart failure, unspecified: Secondary | ICD-10-CM | POA: Diagnosis not present

## 2022-02-06 DIAGNOSIS — Z8673 Personal history of transient ischemic attack (TIA), and cerebral infarction without residual deficits: Secondary | ICD-10-CM | POA: Diagnosis not present

## 2022-02-06 DIAGNOSIS — J449 Chronic obstructive pulmonary disease, unspecified: Secondary | ICD-10-CM | POA: Diagnosis not present

## 2022-02-06 DIAGNOSIS — I480 Paroxysmal atrial fibrillation: Secondary | ICD-10-CM | POA: Diagnosis not present

## 2022-02-06 DIAGNOSIS — M159 Polyosteoarthritis, unspecified: Secondary | ICD-10-CM | POA: Diagnosis not present

## 2022-02-06 DIAGNOSIS — I951 Orthostatic hypotension: Secondary | ICD-10-CM | POA: Diagnosis not present

## 2022-02-06 DIAGNOSIS — I110 Hypertensive heart disease with heart failure: Secondary | ICD-10-CM | POA: Diagnosis not present

## 2022-02-06 DIAGNOSIS — R338 Other retention of urine: Secondary | ICD-10-CM | POA: Diagnosis not present

## 2022-02-06 DIAGNOSIS — R2681 Unsteadiness on feet: Secondary | ICD-10-CM | POA: Diagnosis not present

## 2022-02-06 DIAGNOSIS — Z7982 Long term (current) use of aspirin: Secondary | ICD-10-CM | POA: Diagnosis not present

## 2022-02-06 DIAGNOSIS — Z9181 History of falling: Secondary | ICD-10-CM | POA: Diagnosis not present

## 2022-02-06 DIAGNOSIS — F32A Depression, unspecified: Secondary | ICD-10-CM | POA: Diagnosis not present

## 2022-02-06 DIAGNOSIS — H905 Unspecified sensorineural hearing loss: Secondary | ICD-10-CM | POA: Diagnosis not present

## 2022-02-06 NOTE — Telephone Encounter (Signed)
Spoke with patient regarding results/recommendations.  ?Scheduled pt for in office  ?

## 2022-02-08 ENCOUNTER — Telehealth: Payer: Self-pay

## 2022-02-08 DIAGNOSIS — J449 Chronic obstructive pulmonary disease, unspecified: Secondary | ICD-10-CM | POA: Diagnosis not present

## 2022-02-08 DIAGNOSIS — E538 Deficiency of other specified B group vitamins: Secondary | ICD-10-CM | POA: Diagnosis not present

## 2022-02-08 DIAGNOSIS — I951 Orthostatic hypotension: Secondary | ICD-10-CM | POA: Diagnosis not present

## 2022-02-08 DIAGNOSIS — I509 Heart failure, unspecified: Secondary | ICD-10-CM | POA: Diagnosis not present

## 2022-02-08 DIAGNOSIS — F17210 Nicotine dependence, cigarettes, uncomplicated: Secondary | ICD-10-CM | POA: Diagnosis not present

## 2022-02-08 DIAGNOSIS — Z9181 History of falling: Secondary | ICD-10-CM | POA: Diagnosis not present

## 2022-02-08 DIAGNOSIS — I480 Paroxysmal atrial fibrillation: Secondary | ICD-10-CM | POA: Diagnosis not present

## 2022-02-08 DIAGNOSIS — M159 Polyosteoarthritis, unspecified: Secondary | ICD-10-CM | POA: Diagnosis not present

## 2022-02-08 DIAGNOSIS — I110 Hypertensive heart disease with heart failure: Secondary | ICD-10-CM | POA: Diagnosis not present

## 2022-02-08 DIAGNOSIS — Z8673 Personal history of transient ischemic attack (TIA), and cerebral infarction without residual deficits: Secondary | ICD-10-CM | POA: Diagnosis not present

## 2022-02-08 DIAGNOSIS — E871 Hypo-osmolality and hyponatremia: Secondary | ICD-10-CM | POA: Diagnosis not present

## 2022-02-08 DIAGNOSIS — H905 Unspecified sensorineural hearing loss: Secondary | ICD-10-CM | POA: Diagnosis not present

## 2022-02-08 DIAGNOSIS — F32A Depression, unspecified: Secondary | ICD-10-CM | POA: Diagnosis not present

## 2022-02-08 DIAGNOSIS — Z7982 Long term (current) use of aspirin: Secondary | ICD-10-CM | POA: Diagnosis not present

## 2022-02-08 DIAGNOSIS — R2681 Unsteadiness on feet: Secondary | ICD-10-CM | POA: Diagnosis not present

## 2022-02-08 DIAGNOSIS — K7460 Unspecified cirrhosis of liver: Secondary | ICD-10-CM | POA: Diagnosis not present

## 2022-02-08 DIAGNOSIS — R338 Other retention of urine: Secondary | ICD-10-CM | POA: Diagnosis not present

## 2022-02-08 NOTE — Telephone Encounter (Signed)
Home health PT orders received 02/05/22 for Jacob Frank ?Home health initiation orders: Yes.  ?Home health re-certification orders: No. ?Patient last seen by ordering physician for this condition: 01/19/22. Must be less than 90 days for re-certification and less than 30 days prior for initiation. Visit must have been for the condition the orders are being placed.  ?Patient meets criteria for Physician to sign orders: Yes.  ?      Current med list has been attached: Yes  ?      Orders placed on physicians desk for signature: 02/08/22 (date) ?If patient does not meet criteria for orders to be signed: pt was called to schedule appt. Appt is scheduled for 02/19/22.  ? ?Jacob Frank ? ?

## 2022-02-10 DIAGNOSIS — J449 Chronic obstructive pulmonary disease, unspecified: Secondary | ICD-10-CM | POA: Diagnosis not present

## 2022-02-12 ENCOUNTER — Other Ambulatory Visit: Payer: Self-pay | Admitting: Family Medicine

## 2022-02-15 DIAGNOSIS — I509 Heart failure, unspecified: Secondary | ICD-10-CM | POA: Diagnosis not present

## 2022-02-15 DIAGNOSIS — Z9181 History of falling: Secondary | ICD-10-CM | POA: Diagnosis not present

## 2022-02-15 DIAGNOSIS — M159 Polyosteoarthritis, unspecified: Secondary | ICD-10-CM | POA: Diagnosis not present

## 2022-02-15 DIAGNOSIS — E871 Hypo-osmolality and hyponatremia: Secondary | ICD-10-CM | POA: Diagnosis not present

## 2022-02-15 DIAGNOSIS — R2681 Unsteadiness on feet: Secondary | ICD-10-CM | POA: Diagnosis not present

## 2022-02-15 DIAGNOSIS — I480 Paroxysmal atrial fibrillation: Secondary | ICD-10-CM | POA: Diagnosis not present

## 2022-02-15 DIAGNOSIS — Z8673 Personal history of transient ischemic attack (TIA), and cerebral infarction without residual deficits: Secondary | ICD-10-CM | POA: Diagnosis not present

## 2022-02-15 DIAGNOSIS — K7460 Unspecified cirrhosis of liver: Secondary | ICD-10-CM | POA: Diagnosis not present

## 2022-02-15 DIAGNOSIS — R338 Other retention of urine: Secondary | ICD-10-CM | POA: Diagnosis not present

## 2022-02-15 DIAGNOSIS — I951 Orthostatic hypotension: Secondary | ICD-10-CM | POA: Diagnosis not present

## 2022-02-15 DIAGNOSIS — F17210 Nicotine dependence, cigarettes, uncomplicated: Secondary | ICD-10-CM | POA: Diagnosis not present

## 2022-02-15 DIAGNOSIS — F32A Depression, unspecified: Secondary | ICD-10-CM | POA: Diagnosis not present

## 2022-02-15 DIAGNOSIS — H905 Unspecified sensorineural hearing loss: Secondary | ICD-10-CM | POA: Diagnosis not present

## 2022-02-15 DIAGNOSIS — I110 Hypertensive heart disease with heart failure: Secondary | ICD-10-CM | POA: Diagnosis not present

## 2022-02-15 DIAGNOSIS — E538 Deficiency of other specified B group vitamins: Secondary | ICD-10-CM | POA: Diagnosis not present

## 2022-02-15 DIAGNOSIS — J449 Chronic obstructive pulmonary disease, unspecified: Secondary | ICD-10-CM | POA: Diagnosis not present

## 2022-02-15 DIAGNOSIS — Z7982 Long term (current) use of aspirin: Secondary | ICD-10-CM | POA: Diagnosis not present

## 2022-02-19 ENCOUNTER — Ambulatory Visit: Payer: Medicare Other | Admitting: Family Medicine
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
## 2022-02-20 DIAGNOSIS — I951 Orthostatic hypotension: Secondary | ICD-10-CM | POA: Diagnosis not present

## 2022-02-20 DIAGNOSIS — Z7982 Long term (current) use of aspirin: Secondary | ICD-10-CM | POA: Diagnosis not present

## 2022-02-20 DIAGNOSIS — E538 Deficiency of other specified B group vitamins: Secondary | ICD-10-CM | POA: Diagnosis not present

## 2022-02-20 DIAGNOSIS — I110 Hypertensive heart disease with heart failure: Secondary | ICD-10-CM | POA: Diagnosis not present

## 2022-02-20 DIAGNOSIS — E871 Hypo-osmolality and hyponatremia: Secondary | ICD-10-CM | POA: Diagnosis not present

## 2022-02-20 DIAGNOSIS — F17210 Nicotine dependence, cigarettes, uncomplicated: Secondary | ICD-10-CM | POA: Diagnosis not present

## 2022-02-20 DIAGNOSIS — H905 Unspecified sensorineural hearing loss: Secondary | ICD-10-CM | POA: Diagnosis not present

## 2022-02-20 DIAGNOSIS — I480 Paroxysmal atrial fibrillation: Secondary | ICD-10-CM | POA: Diagnosis not present

## 2022-02-20 DIAGNOSIS — R2681 Unsteadiness on feet: Secondary | ICD-10-CM | POA: Diagnosis not present

## 2022-02-20 DIAGNOSIS — F32A Depression, unspecified: Secondary | ICD-10-CM | POA: Diagnosis not present

## 2022-02-20 DIAGNOSIS — M159 Polyosteoarthritis, unspecified: Secondary | ICD-10-CM | POA: Diagnosis not present

## 2022-02-20 DIAGNOSIS — Z8673 Personal history of transient ischemic attack (TIA), and cerebral infarction without residual deficits: Secondary | ICD-10-CM | POA: Diagnosis not present

## 2022-02-20 DIAGNOSIS — K7460 Unspecified cirrhosis of liver: Secondary | ICD-10-CM | POA: Diagnosis not present

## 2022-02-20 DIAGNOSIS — R338 Other retention of urine: Secondary | ICD-10-CM | POA: Diagnosis not present

## 2022-02-20 DIAGNOSIS — J449 Chronic obstructive pulmonary disease, unspecified: Secondary | ICD-10-CM | POA: Diagnosis not present

## 2022-02-20 DIAGNOSIS — Z9181 History of falling: Secondary | ICD-10-CM | POA: Diagnosis not present

## 2022-02-20 DIAGNOSIS — I509 Heart failure, unspecified: Secondary | ICD-10-CM | POA: Diagnosis not present

## 2022-02-22 DIAGNOSIS — I480 Paroxysmal atrial fibrillation: Secondary | ICD-10-CM | POA: Diagnosis not present

## 2022-02-22 DIAGNOSIS — R2681 Unsteadiness on feet: Secondary | ICD-10-CM | POA: Diagnosis not present

## 2022-02-22 DIAGNOSIS — K7460 Unspecified cirrhosis of liver: Secondary | ICD-10-CM | POA: Diagnosis not present

## 2022-02-22 DIAGNOSIS — I509 Heart failure, unspecified: Secondary | ICD-10-CM | POA: Diagnosis not present

## 2022-02-22 DIAGNOSIS — Z8673 Personal history of transient ischemic attack (TIA), and cerebral infarction without residual deficits: Secondary | ICD-10-CM | POA: Diagnosis not present

## 2022-02-22 DIAGNOSIS — J449 Chronic obstructive pulmonary disease, unspecified: Secondary | ICD-10-CM | POA: Diagnosis not present

## 2022-02-22 DIAGNOSIS — I110 Hypertensive heart disease with heart failure: Secondary | ICD-10-CM | POA: Diagnosis not present

## 2022-02-22 DIAGNOSIS — M159 Polyosteoarthritis, unspecified: Secondary | ICD-10-CM | POA: Diagnosis not present

## 2022-02-22 DIAGNOSIS — Z9181 History of falling: Secondary | ICD-10-CM | POA: Diagnosis not present

## 2022-02-22 DIAGNOSIS — E871 Hypo-osmolality and hyponatremia: Secondary | ICD-10-CM | POA: Diagnosis not present

## 2022-02-22 DIAGNOSIS — Z7982 Long term (current) use of aspirin: Secondary | ICD-10-CM | POA: Diagnosis not present

## 2022-02-22 DIAGNOSIS — E538 Deficiency of other specified B group vitamins: Secondary | ICD-10-CM | POA: Diagnosis not present

## 2022-02-22 DIAGNOSIS — H905 Unspecified sensorineural hearing loss: Secondary | ICD-10-CM | POA: Diagnosis not present

## 2022-02-22 DIAGNOSIS — F32A Depression, unspecified: Secondary | ICD-10-CM | POA: Diagnosis not present

## 2022-02-22 DIAGNOSIS — R338 Other retention of urine: Secondary | ICD-10-CM | POA: Diagnosis not present

## 2022-02-22 DIAGNOSIS — I951 Orthostatic hypotension: Secondary | ICD-10-CM | POA: Diagnosis not present

## 2022-02-22 DIAGNOSIS — F17210 Nicotine dependence, cigarettes, uncomplicated: Secondary | ICD-10-CM | POA: Diagnosis not present

## 2022-02-26 DIAGNOSIS — Z792 Long term (current) use of antibiotics: Secondary | ICD-10-CM | POA: Diagnosis not present

## 2022-02-26 DIAGNOSIS — R410 Disorientation, unspecified: Secondary | ICD-10-CM | POA: Diagnosis not present

## 2022-02-26 DIAGNOSIS — I700 Atherosclerosis of aorta: Secondary | ICD-10-CM | POA: Diagnosis not present

## 2022-02-26 DIAGNOSIS — Z20822 Contact with and (suspected) exposure to covid-19: Secondary | ICD-10-CM | POA: Diagnosis not present

## 2022-02-26 DIAGNOSIS — J449 Chronic obstructive pulmonary disease, unspecified: Secondary | ICD-10-CM | POA: Diagnosis not present

## 2022-02-26 DIAGNOSIS — N281 Cyst of kidney, acquired: Secondary | ICD-10-CM | POA: Diagnosis not present

## 2022-02-26 DIAGNOSIS — S32030D Wedge compression fracture of third lumbar vertebra, subsequent encounter for fracture with routine healing: Secondary | ICD-10-CM | POA: Diagnosis not present

## 2022-02-26 DIAGNOSIS — E876 Hypokalemia: Secondary | ICD-10-CM | POA: Diagnosis not present

## 2022-02-26 DIAGNOSIS — R059 Cough, unspecified: Secondary | ICD-10-CM | POA: Diagnosis not present

## 2022-02-26 DIAGNOSIS — R0902 Hypoxemia: Secondary | ICD-10-CM | POA: Diagnosis not present

## 2022-02-26 DIAGNOSIS — G9341 Metabolic encephalopathy: Secondary | ICD-10-CM | POA: Diagnosis not present

## 2022-02-26 DIAGNOSIS — K5730 Diverticulosis of large intestine without perforation or abscess without bleeding: Secondary | ICD-10-CM | POA: Diagnosis not present

## 2022-02-26 DIAGNOSIS — W19XXXD Unspecified fall, subsequent encounter: Secondary | ICD-10-CM | POA: Diagnosis not present

## 2022-02-26 DIAGNOSIS — E860 Dehydration: Secondary | ICD-10-CM | POA: Diagnosis not present

## 2022-02-26 DIAGNOSIS — W07XXXD Fall from chair, subsequent encounter: Secondary | ICD-10-CM | POA: Diagnosis not present

## 2022-02-26 DIAGNOSIS — M4856XA Collapsed vertebra, not elsewhere classified, lumbar region, initial encounter for fracture: Secondary | ICD-10-CM | POA: Diagnosis not present

## 2022-02-26 DIAGNOSIS — R748 Abnormal levels of other serum enzymes: Secondary | ICD-10-CM | POA: Diagnosis not present

## 2022-02-26 DIAGNOSIS — N19 Unspecified kidney failure: Secondary | ICD-10-CM | POA: Diagnosis not present

## 2022-02-26 DIAGNOSIS — N179 Acute kidney failure, unspecified: Secondary | ICD-10-CM | POA: Diagnosis not present

## 2022-02-26 DIAGNOSIS — Z79899 Other long term (current) drug therapy: Secondary | ICD-10-CM | POA: Diagnosis not present

## 2022-02-28 ENCOUNTER — Ambulatory Visit: Payer: Medicare Other | Admitting: Family Medicine

## 2022-02-28 DIAGNOSIS — E538 Deficiency of other specified B group vitamins: Secondary | ICD-10-CM
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
## 2022-03-05 DIAGNOSIS — J449 Chronic obstructive pulmonary disease, unspecified: Secondary | ICD-10-CM | POA: Diagnosis not present

## 2022-03-05 DIAGNOSIS — I480 Paroxysmal atrial fibrillation: Secondary | ICD-10-CM | POA: Diagnosis not present

## 2022-03-05 DIAGNOSIS — E871 Hypo-osmolality and hyponatremia: Secondary | ICD-10-CM | POA: Diagnosis not present

## 2022-03-05 DIAGNOSIS — M159 Polyosteoarthritis, unspecified: Secondary | ICD-10-CM | POA: Diagnosis not present

## 2022-03-05 DIAGNOSIS — Z8673 Personal history of transient ischemic attack (TIA), and cerebral infarction without residual deficits: Secondary | ICD-10-CM | POA: Diagnosis not present

## 2022-03-05 DIAGNOSIS — I509 Heart failure, unspecified: Secondary | ICD-10-CM | POA: Diagnosis not present

## 2022-03-05 DIAGNOSIS — I951 Orthostatic hypotension: Secondary | ICD-10-CM | POA: Diagnosis not present

## 2022-03-05 DIAGNOSIS — Z7982 Long term (current) use of aspirin: Secondary | ICD-10-CM | POA: Diagnosis not present

## 2022-03-05 DIAGNOSIS — F32A Depression, unspecified: Secondary | ICD-10-CM | POA: Diagnosis not present

## 2022-03-05 DIAGNOSIS — R338 Other retention of urine: Secondary | ICD-10-CM | POA: Diagnosis not present

## 2022-03-05 DIAGNOSIS — R2681 Unsteadiness on feet: Secondary | ICD-10-CM | POA: Diagnosis not present

## 2022-03-05 DIAGNOSIS — I110 Hypertensive heart disease with heart failure: Secondary | ICD-10-CM | POA: Diagnosis not present

## 2022-03-05 DIAGNOSIS — E538 Deficiency of other specified B group vitamins: Secondary | ICD-10-CM | POA: Diagnosis not present

## 2022-03-05 DIAGNOSIS — K7460 Unspecified cirrhosis of liver: Secondary | ICD-10-CM | POA: Diagnosis not present

## 2022-03-05 DIAGNOSIS — F17210 Nicotine dependence, cigarettes, uncomplicated: Secondary | ICD-10-CM | POA: Diagnosis not present

## 2022-03-05 DIAGNOSIS — H905 Unspecified sensorineural hearing loss: Secondary | ICD-10-CM | POA: Diagnosis not present

## 2022-03-05 DIAGNOSIS — Z9181 History of falling: Secondary | ICD-10-CM | POA: Diagnosis not present

## 2022-03-09 ENCOUNTER — Encounter: Payer: Self-pay | Admitting: Family Medicine

## 2022-03-09 ENCOUNTER — Ambulatory Visit (INDEPENDENT_AMBULATORY_CARE_PROVIDER_SITE_OTHER): Payer: Medicare Other | Admitting: Family Medicine

## 2022-03-09 ENCOUNTER — Telehealth: Payer: Self-pay

## 2022-03-09 VITALS — BP 123/84 | HR 98 | Temp 97.8°F | Wt 163.2 lb

## 2022-03-09 DIAGNOSIS — R338 Other retention of urine: Secondary | ICD-10-CM | POA: Diagnosis not present

## 2022-03-09 DIAGNOSIS — I951 Orthostatic hypotension: Secondary | ICD-10-CM

## 2022-03-09 DIAGNOSIS — H905 Unspecified sensorineural hearing loss: Secondary | ICD-10-CM | POA: Diagnosis not present

## 2022-03-09 DIAGNOSIS — F1029 Alcohol dependence with unspecified alcohol-induced disorder: Secondary | ICD-10-CM

## 2022-03-09 DIAGNOSIS — E871 Hypo-osmolality and hyponatremia: Secondary | ICD-10-CM

## 2022-03-09 DIAGNOSIS — I110 Hypertensive heart disease with heart failure: Secondary | ICD-10-CM | POA: Diagnosis not present

## 2022-03-09 DIAGNOSIS — F5101 Primary insomnia: Secondary | ICD-10-CM

## 2022-03-09 DIAGNOSIS — E538 Deficiency of other specified B group vitamins: Secondary | ICD-10-CM | POA: Diagnosis not present

## 2022-03-09 DIAGNOSIS — Z7982 Long term (current) use of aspirin: Secondary | ICD-10-CM | POA: Diagnosis not present

## 2022-03-09 DIAGNOSIS — I480 Paroxysmal atrial fibrillation: Secondary | ICD-10-CM | POA: Diagnosis not present

## 2022-03-09 DIAGNOSIS — M159 Polyosteoarthritis, unspecified: Secondary | ICD-10-CM | POA: Diagnosis not present

## 2022-03-09 DIAGNOSIS — F17210 Nicotine dependence, cigarettes, uncomplicated: Secondary | ICD-10-CM | POA: Diagnosis not present

## 2022-03-09 DIAGNOSIS — J449 Chronic obstructive pulmonary disease, unspecified: Secondary | ICD-10-CM | POA: Diagnosis not present

## 2022-03-09 DIAGNOSIS — I509 Heart failure, unspecified: Secondary | ICD-10-CM | POA: Diagnosis not present

## 2022-03-09 DIAGNOSIS — R2681 Unsteadiness on feet: Secondary | ICD-10-CM | POA: Diagnosis not present

## 2022-03-09 DIAGNOSIS — F32A Depression, unspecified: Secondary | ICD-10-CM | POA: Diagnosis not present

## 2022-03-09 DIAGNOSIS — Z8673 Personal history of transient ischemic attack (TIA), and cerebral infarction without residual deficits: Secondary | ICD-10-CM | POA: Diagnosis not present

## 2022-03-09 DIAGNOSIS — K7460 Unspecified cirrhosis of liver: Secondary | ICD-10-CM | POA: Diagnosis not present

## 2022-03-09 DIAGNOSIS — Z9181 History of falling: Secondary | ICD-10-CM | POA: Diagnosis not present

## 2022-03-09 MED ORDER — MIDODRINE HCL 10 MG PO TABS: 10.0000 mg | ORAL_TABLET | Freq: Three times a day (TID) | ORAL | 2 refills | Status: AC

## 2022-03-09 MED ORDER — THIAMINE HCL 100 MG PO TABS: 100.0000 mg | ORAL_TABLET | Freq: Every day | ORAL | 1 refills | Status: DC

## 2022-03-09 MED ORDER — ALBUTEROL SULFATE HFA 108 (90 BASE) MCG/ACT IN AERS: INHALATION_SPRAY | RESPIRATORY_TRACT | 2 refills | Status: DC

## 2022-03-09 MED ORDER — IBUPROFEN 800 MG PO TABS: ORAL_TABLET | ORAL | 0 refills | Status: DC

## 2022-03-09 MED ORDER — CYANOCOBALAMIN 1000 MCG/ML IJ SOLN: 1000.0000 ug | Freq: Once | INTRAMUSCULAR | Status: DC

## 2022-03-09 MED ORDER — PANTOPRAZOLE SODIUM 40 MG PO TBEC: 40.0000 mg | DELAYED_RELEASE_TABLET | Freq: Every day | ORAL | 1 refills | Status: DC

## 2022-03-09 MED ORDER — BEVESPI AEROSPHERE 9-4.8 MCG/ACT IN AERO: INHALATION_SPRAY | RESPIRATORY_TRACT | 3 refills | Status: AC

## 2022-03-09 MED ORDER — CYANOCOBALAMIN 1000 MCG/ML IJ SOLN
1000.0000 ug | Freq: Once | INTRAMUSCULAR | Status: AC
  Administered 2022-03-09: 1000 ug via INTRAMUSCULAR
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
## 2022-03-09 NOTE — Telephone Encounter (Signed)
Home health orders received 03/09/22 for [name] initiation orders: No.  Home health re-certification orders: Yes. Patient last seen by ordering physician for this condition: 02/01/22. Must be less than 90 days for re-certification and less than 30 days prior for initiation. Visit must have been for the condition the orders are being placed.  Patient meets criteria for Physician to sign orders: Yes.        Current med list has been attached: Yes        Orders placed on physicians desk for signature: 03/09/22 (date) If patient does not meet criteria for orders to be signed: pt was called to schedule appt. Appt is scheduled for n/a.   Jacob Frank.

## 2022-03-09 NOTE — Telephone Encounter (Signed)
Yes ok 

## 2022-03-09 NOTE — Telephone Encounter (Signed)
Orders placed on desk

## 2022-03-12 DIAGNOSIS — R2681 Unsteadiness on feet: Secondary | ICD-10-CM | POA: Diagnosis not present

## 2022-03-12 DIAGNOSIS — R338 Other retention of urine: Secondary | ICD-10-CM | POA: Diagnosis not present

## 2022-03-12 DIAGNOSIS — Z9181 History of falling: Secondary | ICD-10-CM | POA: Diagnosis not present

## 2022-03-12 DIAGNOSIS — H905 Unspecified sensorineural hearing loss: Secondary | ICD-10-CM | POA: Diagnosis not present

## 2022-03-12 DIAGNOSIS — I951 Orthostatic hypotension: Secondary | ICD-10-CM | POA: Diagnosis not present

## 2022-03-12 DIAGNOSIS — I509 Heart failure, unspecified: Secondary | ICD-10-CM | POA: Diagnosis not present

## 2022-03-12 DIAGNOSIS — J449 Chronic obstructive pulmonary disease, unspecified: Secondary | ICD-10-CM | POA: Diagnosis not present

## 2022-03-12 DIAGNOSIS — Z7982 Long term (current) use of aspirin: Secondary | ICD-10-CM | POA: Diagnosis not present

## 2022-03-12 DIAGNOSIS — I480 Paroxysmal atrial fibrillation: Secondary | ICD-10-CM | POA: Diagnosis not present

## 2022-03-12 DIAGNOSIS — M159 Polyosteoarthritis, unspecified: Secondary | ICD-10-CM | POA: Diagnosis not present

## 2022-03-12 DIAGNOSIS — E871 Hypo-osmolality and hyponatremia: Secondary | ICD-10-CM | POA: Diagnosis not present

## 2022-03-12 DIAGNOSIS — I110 Hypertensive heart disease with heart failure: Secondary | ICD-10-CM | POA: Diagnosis not present

## 2022-03-12 DIAGNOSIS — K7460 Unspecified cirrhosis of liver: Secondary | ICD-10-CM | POA: Diagnosis not present

## 2022-03-12 DIAGNOSIS — Z8673 Personal history of transient ischemic attack (TIA), and cerebral infarction without residual deficits: Secondary | ICD-10-CM | POA: Diagnosis not present

## 2022-03-12 DIAGNOSIS — F32A Depression, unspecified: Secondary | ICD-10-CM | POA: Diagnosis not present

## 2022-03-12 DIAGNOSIS — E538 Deficiency of other specified B group vitamins: Secondary | ICD-10-CM | POA: Diagnosis not present

## 2022-03-12 DIAGNOSIS — F17210 Nicotine dependence, cigarettes, uncomplicated: Secondary | ICD-10-CM | POA: Diagnosis not present

## 2022-03-12 NOTE — Telephone Encounter (Signed)
Signed and put in box to go up front.  

## 2022-03-13 ENCOUNTER — Other Ambulatory Visit: Payer: Self-pay | Admitting: Family Medicine

## 2022-03-13 DIAGNOSIS — J449 Chronic obstructive pulmonary disease, unspecified: Secondary | ICD-10-CM | POA: Diagnosis not present

## 2022-03-14 DIAGNOSIS — Z8673 Personal history of transient ischemic attack (TIA), and cerebral infarction without residual deficits: Secondary | ICD-10-CM | POA: Diagnosis not present

## 2022-03-14 DIAGNOSIS — R2681 Unsteadiness on feet: Secondary | ICD-10-CM | POA: Diagnosis not present

## 2022-03-14 DIAGNOSIS — I110 Hypertensive heart disease with heart failure: Secondary | ICD-10-CM | POA: Diagnosis not present

## 2022-03-14 DIAGNOSIS — I509 Heart failure, unspecified: Secondary | ICD-10-CM | POA: Diagnosis not present

## 2022-03-14 DIAGNOSIS — E538 Deficiency of other specified B group vitamins: Secondary | ICD-10-CM | POA: Diagnosis not present

## 2022-03-14 DIAGNOSIS — M159 Polyosteoarthritis, unspecified: Secondary | ICD-10-CM | POA: Diagnosis not present

## 2022-03-14 DIAGNOSIS — J449 Chronic obstructive pulmonary disease, unspecified: Secondary | ICD-10-CM | POA: Diagnosis not present

## 2022-03-14 DIAGNOSIS — I951 Orthostatic hypotension: Secondary | ICD-10-CM | POA: Diagnosis not present

## 2022-03-14 DIAGNOSIS — Z7982 Long term (current) use of aspirin: Secondary | ICD-10-CM | POA: Diagnosis not present

## 2022-03-14 DIAGNOSIS — F32A Depression, unspecified: Secondary | ICD-10-CM | POA: Diagnosis not present

## 2022-03-14 DIAGNOSIS — I480 Paroxysmal atrial fibrillation: Secondary | ICD-10-CM | POA: Diagnosis not present

## 2022-03-14 DIAGNOSIS — F17210 Nicotine dependence, cigarettes, uncomplicated: Secondary | ICD-10-CM | POA: Diagnosis not present

## 2022-03-14 DIAGNOSIS — K7460 Unspecified cirrhosis of liver: Secondary | ICD-10-CM | POA: Diagnosis not present

## 2022-03-14 DIAGNOSIS — E871 Hypo-osmolality and hyponatremia: Secondary | ICD-10-CM | POA: Diagnosis not present

## 2022-03-14 DIAGNOSIS — H905 Unspecified sensorineural hearing loss: Secondary | ICD-10-CM | POA: Diagnosis not present

## 2022-03-14 DIAGNOSIS — Z9181 History of falling: Secondary | ICD-10-CM | POA: Diagnosis not present

## 2022-03-14 DIAGNOSIS — R338 Other retention of urine: Secondary | ICD-10-CM | POA: Diagnosis not present

## 2022-03-19 DIAGNOSIS — F32A Depression, unspecified: Secondary | ICD-10-CM | POA: Diagnosis not present

## 2022-03-19 DIAGNOSIS — R2681 Unsteadiness on feet: Secondary | ICD-10-CM | POA: Diagnosis not present

## 2022-03-19 DIAGNOSIS — F17210 Nicotine dependence, cigarettes, uncomplicated: Secondary | ICD-10-CM | POA: Diagnosis not present

## 2022-03-19 DIAGNOSIS — I509 Heart failure, unspecified: Secondary | ICD-10-CM | POA: Diagnosis not present

## 2022-03-19 DIAGNOSIS — J449 Chronic obstructive pulmonary disease, unspecified: Secondary | ICD-10-CM | POA: Diagnosis not present

## 2022-03-19 DIAGNOSIS — Z8673 Personal history of transient ischemic attack (TIA), and cerebral infarction without residual deficits: Secondary | ICD-10-CM | POA: Diagnosis not present

## 2022-03-19 DIAGNOSIS — M159 Polyosteoarthritis, unspecified: Secondary | ICD-10-CM | POA: Diagnosis not present

## 2022-03-19 DIAGNOSIS — E871 Hypo-osmolality and hyponatremia: Secondary | ICD-10-CM | POA: Diagnosis not present

## 2022-03-19 DIAGNOSIS — R338 Other retention of urine: Secondary | ICD-10-CM | POA: Diagnosis not present

## 2022-03-19 DIAGNOSIS — E538 Deficiency of other specified B group vitamins: Secondary | ICD-10-CM | POA: Diagnosis not present

## 2022-03-19 DIAGNOSIS — I951 Orthostatic hypotension: Secondary | ICD-10-CM | POA: Diagnosis not present

## 2022-03-19 DIAGNOSIS — I480 Paroxysmal atrial fibrillation: Secondary | ICD-10-CM | POA: Diagnosis not present

## 2022-03-19 DIAGNOSIS — Z7982 Long term (current) use of aspirin: Secondary | ICD-10-CM | POA: Diagnosis not present

## 2022-03-19 DIAGNOSIS — Z9181 History of falling: Secondary | ICD-10-CM | POA: Diagnosis not present

## 2022-03-19 DIAGNOSIS — I110 Hypertensive heart disease with heart failure: Secondary | ICD-10-CM | POA: Diagnosis not present

## 2022-03-19 DIAGNOSIS — K7460 Unspecified cirrhosis of liver: Secondary | ICD-10-CM | POA: Diagnosis not present

## 2022-03-19 DIAGNOSIS — H905 Unspecified sensorineural hearing loss: Secondary | ICD-10-CM | POA: Diagnosis not present

## 2022-03-23 DIAGNOSIS — E871 Hypo-osmolality and hyponatremia: Secondary | ICD-10-CM | POA: Diagnosis not present

## 2022-03-23 DIAGNOSIS — I951 Orthostatic hypotension: Secondary | ICD-10-CM | POA: Diagnosis not present

## 2022-03-23 DIAGNOSIS — R338 Other retention of urine: Secondary | ICD-10-CM | POA: Diagnosis not present

## 2022-03-23 DIAGNOSIS — F32A Depression, unspecified: Secondary | ICD-10-CM | POA: Diagnosis not present

## 2022-03-23 DIAGNOSIS — F17210 Nicotine dependence, cigarettes, uncomplicated: Secondary | ICD-10-CM | POA: Diagnosis not present

## 2022-03-23 DIAGNOSIS — Z7982 Long term (current) use of aspirin: Secondary | ICD-10-CM | POA: Diagnosis not present

## 2022-03-23 DIAGNOSIS — H905 Unspecified sensorineural hearing loss: Secondary | ICD-10-CM | POA: Diagnosis not present

## 2022-03-23 DIAGNOSIS — I480 Paroxysmal atrial fibrillation: Secondary | ICD-10-CM | POA: Diagnosis not present

## 2022-03-23 DIAGNOSIS — I110 Hypertensive heart disease with heart failure: Secondary | ICD-10-CM | POA: Diagnosis not present

## 2022-03-23 DIAGNOSIS — M159 Polyosteoarthritis, unspecified: Secondary | ICD-10-CM | POA: Diagnosis not present

## 2022-03-23 DIAGNOSIS — R2681 Unsteadiness on feet: Secondary | ICD-10-CM | POA: Diagnosis not present

## 2022-03-23 DIAGNOSIS — Z8673 Personal history of transient ischemic attack (TIA), and cerebral infarction without residual deficits: Secondary | ICD-10-CM | POA: Diagnosis not present

## 2022-03-23 DIAGNOSIS — K7460 Unspecified cirrhosis of liver: Secondary | ICD-10-CM | POA: Diagnosis not present

## 2022-03-23 DIAGNOSIS — I509 Heart failure, unspecified: Secondary | ICD-10-CM | POA: Diagnosis not present

## 2022-03-23 DIAGNOSIS — E538 Deficiency of other specified B group vitamins: Secondary | ICD-10-CM | POA: Diagnosis not present

## 2022-03-23 DIAGNOSIS — Z9181 History of falling: Secondary | ICD-10-CM | POA: Diagnosis not present

## 2022-03-23 DIAGNOSIS — J449 Chronic obstructive pulmonary disease, unspecified: Secondary | ICD-10-CM | POA: Diagnosis not present

## 2022-04-09 ENCOUNTER — Other Ambulatory Visit: Payer: Self-pay | Admitting: Family Medicine

## 2022-04-12 DIAGNOSIS — J449 Chronic obstructive pulmonary disease, unspecified: Secondary | ICD-10-CM | POA: Diagnosis not present

## 2022-04-17 ENCOUNTER — Telehealth: Payer: Self-pay

## 2022-04-17 ENCOUNTER — Ambulatory Visit: Payer: Medicare Other | Admitting: Family Medicine

## 2022-04-17 DIAGNOSIS — E876 Hypokalemia: Secondary | ICD-10-CM | POA: Diagnosis not present

## 2022-04-17 DIAGNOSIS — N179 Acute kidney failure, unspecified: Secondary | ICD-10-CM | POA: Diagnosis not present

## 2022-04-17 DIAGNOSIS — I639 Cerebral infarction, unspecified: Secondary | ICD-10-CM | POA: Diagnosis not present

## 2022-04-17 DIAGNOSIS — R279 Unspecified lack of coordination: Secondary | ICD-10-CM | POA: Diagnosis not present

## 2022-04-17 DIAGNOSIS — I10 Essential (primary) hypertension: Secondary | ICD-10-CM | POA: Diagnosis not present

## 2022-04-17 DIAGNOSIS — R404 Transient alteration of awareness: Secondary | ICD-10-CM | POA: Diagnosis not present

## 2022-04-17 DIAGNOSIS — Z79899 Other long term (current) drug therapy: Secondary | ICD-10-CM | POA: Diagnosis not present

## 2022-04-17 DIAGNOSIS — R42 Dizziness and giddiness: Secondary | ICD-10-CM | POA: Diagnosis not present

## 2022-04-17 DIAGNOSIS — Z955 Presence of coronary angioplasty implant and graft: Secondary | ICD-10-CM | POA: Diagnosis not present

## 2022-04-17 DIAGNOSIS — R2689 Other abnormalities of gait and mobility: Secondary | ICD-10-CM | POA: Diagnosis not present

## 2022-04-17 DIAGNOSIS — K5641 Fecal impaction: Secondary | ICD-10-CM | POA: Diagnosis not present

## 2022-04-17 DIAGNOSIS — R2681 Unsteadiness on feet: Secondary | ICD-10-CM | POA: Diagnosis not present

## 2022-04-17 DIAGNOSIS — I110 Hypertensive heart disease with heart failure: Secondary | ICD-10-CM | POA: Diagnosis not present

## 2022-04-17 DIAGNOSIS — R55 Syncope and collapse: Secondary | ICD-10-CM | POA: Diagnosis not present

## 2022-04-17 DIAGNOSIS — Z743 Need for continuous supervision: Secondary | ICD-10-CM | POA: Diagnosis not present

## 2022-04-17 DIAGNOSIS — J449 Chronic obstructive pulmonary disease, unspecified: Secondary | ICD-10-CM | POA: Diagnosis not present

## 2022-04-17 DIAGNOSIS — K5900 Constipation, unspecified: Secondary | ICD-10-CM | POA: Diagnosis not present

## 2022-04-17 DIAGNOSIS — R748 Abnormal levels of other serum enzymes: Secondary | ICD-10-CM | POA: Diagnosis not present

## 2022-04-17 DIAGNOSIS — E860 Dehydration: Secondary | ICD-10-CM | POA: Diagnosis not present

## 2022-04-17 DIAGNOSIS — M6281 Muscle weakness (generalized): Secondary | ICD-10-CM | POA: Diagnosis not present

## 2022-04-17 DIAGNOSIS — J9690 Respiratory failure, unspecified, unspecified whether with hypoxia or hypercapnia: Secondary | ICD-10-CM | POA: Diagnosis not present

## 2022-04-17 DIAGNOSIS — H814 Vertigo of central origin: Secondary | ICD-10-CM | POA: Diagnosis not present

## 2022-04-17 DIAGNOSIS — Z8673 Personal history of transient ischemic attack (TIA), and cerebral infarction without residual deficits: Secondary | ICD-10-CM | POA: Diagnosis not present

## 2022-04-17 DIAGNOSIS — R0902 Hypoxemia: Secondary | ICD-10-CM | POA: Diagnosis not present

## 2022-04-17 DIAGNOSIS — R262 Difficulty in walking, not elsewhere classified: Secondary | ICD-10-CM | POA: Diagnosis not present

## 2022-04-17 DIAGNOSIS — I952 Hypotension due to drugs: Secondary | ICD-10-CM | POA: Diagnosis not present

## 2022-04-17 DIAGNOSIS — I509 Heart failure, unspecified: Secondary | ICD-10-CM | POA: Diagnosis not present

## 2022-04-17 DIAGNOSIS — Z9981 Dependence on supplemental oxygen: Secondary | ICD-10-CM | POA: Diagnosis not present

## 2022-04-17 DIAGNOSIS — Z5989 Other problems related to housing and economic circumstances: Secondary | ICD-10-CM | POA: Diagnosis not present

## 2022-04-17 DIAGNOSIS — I252 Old myocardial infarction: Secondary | ICD-10-CM | POA: Diagnosis not present

## 2022-04-17 NOTE — Telephone Encounter (Signed)
Patient was scheduled to see Dr. Anitra Lauth today at [place].  Patient requires transportation to all appts.  When patient did not show, I called to verify with patient about transportation.  He stated that he is currently at [facility] ED, he was taken by EMS earlier this morning.  Please do not charge patient "no show" fee for today due to hospitalization.
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
## 2022-04-17 NOTE — Telephone Encounter (Signed)
Noted; PCP made aware

## 2022-04-18 DIAGNOSIS — I10 Essential (primary) hypertension: Secondary | ICD-10-CM | POA: Diagnosis not present

## 2022-04-18 DIAGNOSIS — N179 Acute kidney failure, unspecified: Secondary | ICD-10-CM | POA: Diagnosis not present

## 2022-04-18 DIAGNOSIS — R748 Abnormal levels of other serum enzymes: Secondary | ICD-10-CM | POA: Diagnosis not present

## 2022-04-18 DIAGNOSIS — K5900 Constipation, unspecified: Secondary | ICD-10-CM | POA: Diagnosis not present

## 2022-04-18 DIAGNOSIS — R2681 Unsteadiness on feet: Secondary | ICD-10-CM | POA: Diagnosis not present

## 2022-04-18 DIAGNOSIS — Z79899 Other long term (current) drug therapy: Secondary | ICD-10-CM | POA: Diagnosis not present

## 2022-04-18 DIAGNOSIS — Z9981 Dependence on supplemental oxygen: Secondary | ICD-10-CM | POA: Diagnosis not present

## 2022-04-18 DIAGNOSIS — J9690 Respiratory failure, unspecified, unspecified whether with hypoxia or hypercapnia: Secondary | ICD-10-CM | POA: Diagnosis not present

## 2022-04-18 DIAGNOSIS — R42 Dizziness and giddiness: Secondary | ICD-10-CM | POA: Diagnosis not present

## 2022-04-18 DIAGNOSIS — J449 Chronic obstructive pulmonary disease, unspecified: Secondary | ICD-10-CM | POA: Diagnosis not present

## 2022-04-19 DIAGNOSIS — J449 Chronic obstructive pulmonary disease, unspecified: Secondary | ICD-10-CM | POA: Diagnosis not present

## 2022-04-19 DIAGNOSIS — R748 Abnormal levels of other serum enzymes: Secondary | ICD-10-CM | POA: Diagnosis not present

## 2022-04-19 DIAGNOSIS — Z9981 Dependence on supplemental oxygen: Secondary | ICD-10-CM | POA: Diagnosis not present

## 2022-04-19 DIAGNOSIS — K5900 Constipation, unspecified: Secondary | ICD-10-CM | POA: Diagnosis not present

## 2022-04-19 DIAGNOSIS — R42 Dizziness and giddiness: Secondary | ICD-10-CM | POA: Diagnosis not present

## 2022-04-19 DIAGNOSIS — I10 Essential (primary) hypertension: Secondary | ICD-10-CM | POA: Diagnosis not present

## 2022-04-19 DIAGNOSIS — R2681 Unsteadiness on feet: Secondary | ICD-10-CM | POA: Diagnosis not present

## 2022-04-19 DIAGNOSIS — Z79899 Other long term (current) drug therapy: Secondary | ICD-10-CM | POA: Diagnosis not present

## 2022-04-20 DIAGNOSIS — R748 Abnormal levels of other serum enzymes: Secondary | ICD-10-CM | POA: Diagnosis not present

## 2022-04-20 DIAGNOSIS — Z720 Tobacco use: Secondary | ICD-10-CM | POA: Diagnosis not present

## 2022-04-20 DIAGNOSIS — H814 Vertigo of central origin: Secondary | ICD-10-CM | POA: Diagnosis not present

## 2022-04-20 DIAGNOSIS — R42 Dizziness and giddiness: Secondary | ICD-10-CM | POA: Diagnosis not present

## 2022-04-20 DIAGNOSIS — F1491 Cocaine use, unspecified, in remission: Secondary | ICD-10-CM | POA: Diagnosis not present

## 2022-04-20 DIAGNOSIS — J449 Chronic obstructive pulmonary disease, unspecified: Secondary | ICD-10-CM | POA: Diagnosis not present

## 2022-04-20 DIAGNOSIS — Z743 Need for continuous supervision: Secondary | ICD-10-CM | POA: Diagnosis not present

## 2022-04-20 DIAGNOSIS — R262 Difficulty in walking, not elsewhere classified: Secondary | ICD-10-CM | POA: Diagnosis not present

## 2022-04-20 DIAGNOSIS — K5900 Constipation, unspecified: Secondary | ICD-10-CM | POA: Diagnosis not present

## 2022-04-20 DIAGNOSIS — R2689 Other abnormalities of gait and mobility: Secondary | ICD-10-CM | POA: Diagnosis not present

## 2022-04-20 DIAGNOSIS — R279 Unspecified lack of coordination: Secondary | ICD-10-CM | POA: Diagnosis not present

## 2022-04-20 DIAGNOSIS — E878 Other disorders of electrolyte and fluid balance, not elsewhere classified: Secondary | ICD-10-CM | POA: Diagnosis not present

## 2022-04-20 DIAGNOSIS — N179 Acute kidney failure, unspecified: Secondary | ICD-10-CM | POA: Diagnosis not present

## 2022-04-20 DIAGNOSIS — M6281 Muscle weakness (generalized): Secondary | ICD-10-CM | POA: Diagnosis not present

## 2022-04-20 DIAGNOSIS — I10 Essential (primary) hypertension: Secondary | ICD-10-CM | POA: Diagnosis not present

## 2022-04-23 DIAGNOSIS — N179 Acute kidney failure, unspecified: Secondary | ICD-10-CM | POA: Diagnosis not present

## 2022-04-23 DIAGNOSIS — Z720 Tobacco use: Secondary | ICD-10-CM | POA: Diagnosis not present

## 2022-04-23 DIAGNOSIS — K5900 Constipation, unspecified: Secondary | ICD-10-CM | POA: Diagnosis not present

## 2022-04-23 DIAGNOSIS — F1491 Cocaine use, unspecified, in remission: Secondary | ICD-10-CM | POA: Diagnosis not present

## 2022-04-23 DIAGNOSIS — I10 Essential (primary) hypertension: Secondary | ICD-10-CM | POA: Diagnosis not present

## 2022-04-23 DIAGNOSIS — E878 Other disorders of electrolyte and fluid balance, not elsewhere classified: Secondary | ICD-10-CM | POA: Diagnosis not present

## 2022-04-23 DIAGNOSIS — R42 Dizziness and giddiness: Secondary | ICD-10-CM | POA: Diagnosis not present

## 2022-04-24 ENCOUNTER — Inpatient Hospital Stay: Payer: Medicare Other | Admitting: Family Medicine
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
## 2022-04-25 DIAGNOSIS — M6281 Muscle weakness (generalized): Secondary | ICD-10-CM | POA: Diagnosis not present

## 2022-04-25 DIAGNOSIS — N179 Acute kidney failure, unspecified: Secondary | ICD-10-CM | POA: Diagnosis not present

## 2022-04-25 DIAGNOSIS — R42 Dizziness and giddiness: Secondary | ICD-10-CM | POA: Diagnosis not present

## 2022-04-25 DIAGNOSIS — K5900 Constipation, unspecified: Secondary | ICD-10-CM | POA: Diagnosis not present

## 2022-04-25 DIAGNOSIS — I10 Essential (primary) hypertension: Secondary | ICD-10-CM | POA: Diagnosis not present

## 2022-04-26 ENCOUNTER — Ambulatory Visit: Payer: Self-pay | Admitting: *Deleted

## 2022-04-26 NOTE — Patient Outreach (Signed)
  Care Coordination [facility] Note Transition Care Management Unsuccessful Follow-up Telephone Call  Date of discharge and from where:  04/20/22 FROM [facility]  Attempts:  1st Attempt  Reason for unsuccessful TCM follow-up call:  Unable to leave message  Hubert Azure RN, MSN RN Care Management Coordinator  [place] [phone] [email or web address]

## 2022-04-27 ENCOUNTER — Ambulatory Visit: Payer: Self-pay | Admitting: *Deleted

## 2022-04-27 NOTE — Patient Outreach (Signed)
  Care Coordination [facility] Note Transition Care Management Unsuccessful Follow-up Telephone Call  Date of discharge and from where:  04/20/22 FROM [facility]  Attempts:  2nd Attempt  Reason for unsuccessful TCM follow-up call:  Unable to reach patient  Hubert Azure RN, MSN RN Care Management Coordinator  [place] [phone] [email or web address]

## 2022-04-30 ENCOUNTER — Encounter: Payer: Self-pay | Admitting: Family Medicine

## 2022-05-04 DIAGNOSIS — H814 Vertigo of central origin: Secondary | ICD-10-CM | POA: Diagnosis not present

## 2022-05-04 DIAGNOSIS — R2689 Other abnormalities of gait and mobility: Secondary | ICD-10-CM | POA: Diagnosis not present

## 2022-05-04 DIAGNOSIS — J449 Chronic obstructive pulmonary disease, unspecified: Secondary | ICD-10-CM | POA: Diagnosis not present

## 2022-05-04 DIAGNOSIS — R42 Dizziness and giddiness: Secondary | ICD-10-CM | POA: Diagnosis not present

## 2022-05-04 DIAGNOSIS — N179 Acute kidney failure, unspecified: Secondary | ICD-10-CM | POA: Diagnosis not present

## 2022-05-04 DIAGNOSIS — I10 Essential (primary) hypertension: Secondary | ICD-10-CM | POA: Diagnosis not present

## 2022-05-04 DIAGNOSIS — M6281 Muscle weakness (generalized): Secondary | ICD-10-CM | POA: Diagnosis not present

## 2022-05-04 DIAGNOSIS — R262 Difficulty in walking, not elsewhere classified: Secondary | ICD-10-CM | POA: Diagnosis not present

## 2022-05-04 DIAGNOSIS — K5900 Constipation, unspecified: Secondary | ICD-10-CM | POA: Diagnosis not present

## 2022-05-06 DIAGNOSIS — J449 Chronic obstructive pulmonary disease, unspecified: Secondary | ICD-10-CM | POA: Diagnosis not present

## 2022-05-06 DIAGNOSIS — M6281 Muscle weakness (generalized): Secondary | ICD-10-CM | POA: Diagnosis not present

## 2022-05-06 DIAGNOSIS — R262 Difficulty in walking, not elsewhere classified: Secondary | ICD-10-CM | POA: Diagnosis not present

## 2022-05-06 DIAGNOSIS — R2689 Other abnormalities of gait and mobility: Secondary | ICD-10-CM | POA: Diagnosis not present

## 2022-05-07 DIAGNOSIS — I10 Essential (primary) hypertension: Secondary | ICD-10-CM | POA: Diagnosis not present

## 2022-05-07 DIAGNOSIS — M6281 Muscle weakness (generalized): Secondary | ICD-10-CM | POA: Diagnosis not present

## 2022-05-07 DIAGNOSIS — R262 Difficulty in walking, not elsewhere classified: Secondary | ICD-10-CM | POA: Diagnosis not present

## 2022-05-07 DIAGNOSIS — J449 Chronic obstructive pulmonary disease, unspecified: Secondary | ICD-10-CM | POA: Diagnosis not present

## 2022-05-07 DIAGNOSIS — R2689 Other abnormalities of gait and mobility: Secondary | ICD-10-CM | POA: Diagnosis not present

## 2022-05-08 DIAGNOSIS — R262 Difficulty in walking, not elsewhere classified: Secondary | ICD-10-CM | POA: Diagnosis not present

## 2022-05-08 DIAGNOSIS — N179 Acute kidney failure, unspecified: Secondary | ICD-10-CM | POA: Diagnosis not present

## 2022-05-08 DIAGNOSIS — J449 Chronic obstructive pulmonary disease, unspecified: Secondary | ICD-10-CM | POA: Diagnosis not present

## 2022-05-08 DIAGNOSIS — M6281 Muscle weakness (generalized): Secondary | ICD-10-CM | POA: Diagnosis not present

## 2022-05-08 DIAGNOSIS — R42 Dizziness and giddiness: Secondary | ICD-10-CM | POA: Diagnosis not present

## 2022-05-08 DIAGNOSIS — K5900 Constipation, unspecified: Secondary | ICD-10-CM | POA: Diagnosis not present

## 2022-05-08 DIAGNOSIS — I10 Essential (primary) hypertension: Secondary | ICD-10-CM | POA: Diagnosis not present

## 2022-05-08 DIAGNOSIS — R2689 Other abnormalities of gait and mobility: Secondary | ICD-10-CM | POA: Diagnosis not present

## 2022-05-13 DIAGNOSIS — J449 Chronic obstructive pulmonary disease, unspecified: Secondary | ICD-10-CM | POA: Diagnosis not present

## 2022-05-21 ENCOUNTER — Telehealth: Payer: Self-pay | Admitting: Family Medicine

## 2022-05-23 ENCOUNTER — Other Ambulatory Visit: Payer: Self-pay | Admitting: Family Medicine

## 2022-05-24 NOTE — Telephone Encounter (Signed)
Patient called about his blood pressure meds. He was nt sure of the name when he called earlier.  He needs refill sent to [facility].  Patient is scheduled to see Dr. Anitra Lauth on 8/31.  metoprolol succinate (TOPROL-XL) 100 MG 24 hr tablet [[id number]]

## 2022-05-24 NOTE — Telephone Encounter (Signed)
Patient called back saying he has been suffering for two days and needs a re-fill on his blood pressure medication. He could not tell me the name of the medication but wants it sent to [facility] in [place].

## 2022-05-24 NOTE — Telephone Encounter (Signed)
Error.  Patient does not take this medication.  He wasn't sure name of blood pressure meds.  See previous telephone note dated 8/21.

## 2022-05-24 NOTE — Telephone Encounter (Signed)
Spoke with pharmacy, patient called stating he could not find his medication. He was given temporary supply of #15 tabs today. He will have labs done during 8/31 appointment. Patient was advised this supply should last him until his next appointment. Patient voiced understanding

## 2022-05-31 ENCOUNTER — Ambulatory Visit (INDEPENDENT_AMBULATORY_CARE_PROVIDER_SITE_OTHER): Payer: Medicare Other | Admitting: Family Medicine

## 2022-05-31 ENCOUNTER — Encounter: Payer: Self-pay | Admitting: Family Medicine

## 2022-05-31 VITALS — BP 129/86 | HR 79 | Temp 98.5°F | Ht 69.0 in | Wt 169.4 lb

## 2022-05-31 DIAGNOSIS — F1020 Alcohol dependence, uncomplicated: Secondary | ICD-10-CM

## 2022-05-31 DIAGNOSIS — E538 Deficiency of other specified B group vitamins: Secondary | ICD-10-CM | POA: Diagnosis not present

## 2022-05-31 DIAGNOSIS — F5101 Primary insomnia: Secondary | ICD-10-CM | POA: Diagnosis not present

## 2022-05-31 DIAGNOSIS — F320 Major depressive disorder, single episode, mild: Secondary | ICD-10-CM

## 2022-05-31 DIAGNOSIS — I951 Orthostatic hypotension: Secondary | ICD-10-CM | POA: Diagnosis not present

## 2022-05-31 DIAGNOSIS — I10 Essential (primary) hypertension: Secondary | ICD-10-CM

## 2022-05-31 DIAGNOSIS — M150 Primary generalized (osteo)arthritis: Secondary | ICD-10-CM

## 2022-05-31 DIAGNOSIS — M159 Polyosteoarthritis, unspecified: Secondary | ICD-10-CM

## 2022-05-31 MED ORDER — IBUPROFEN 800 MG PO TABS: ORAL_TABLET | ORAL | 1 refills | Status: DC

## 2022-05-31 MED ORDER — CYANOCOBALAMIN 1000 MCG/ML IJ SOLN
1000.0000 ug | Freq: Once | INTRAMUSCULAR | Status: AC
  Administered 2022-05-31: 1000 ug via INTRAMUSCULAR

## 2022-05-31 MED ORDER — ALBUTEROL SULFATE HFA 108 (90 BASE) MCG/ACT IN AERS: INHALATION_SPRAY | RESPIRATORY_TRACT | 2 refills | Status: DC

## 2022-05-31 MED ORDER — METOPROLOL SUCCINATE ER 100 MG PO TB24
ORAL_TABLET | ORAL | 1 refills | Status: DC
Start: 2022-05-31 — End: 2022-06-25

## 2022-05-31 MED ORDER — QUETIAPINE FUMARATE 400 MG PO TABS
400.0000 mg | ORAL_TABLET | Freq: Two times a day (BID) | ORAL | 1 refills | Status: DC
Start: 2022-05-31 — End: 2022-06-25
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
## 2022-06-13 DIAGNOSIS — J449 Chronic obstructive pulmonary disease, unspecified: Secondary | ICD-10-CM | POA: Diagnosis not present

## 2022-06-17 ENCOUNTER — Emergency Department (HOSPITAL_COMMUNITY): Payer: Medicare Other

## 2022-06-17 ENCOUNTER — Other Ambulatory Visit: Payer: Self-pay

## 2022-06-17 ENCOUNTER — Encounter (HOSPITAL_COMMUNITY): Payer: Self-pay

## 2022-06-17 ENCOUNTER — Inpatient Hospital Stay (HOSPITAL_COMMUNITY)
Admission: EM | Admit: 2022-06-17 | Discharge: 2022-06-25 | DRG: 480 | Disposition: A | Discharge status: Skilled Nursing Facility | Payer: Medicare Other | Attending: Family Medicine | Admitting: Family Medicine

## 2022-06-17 DIAGNOSIS — Z7401 Bed confinement status: Secondary | ICD-10-CM | POA: Diagnosis not present

## 2022-06-17 DIAGNOSIS — E876 Hypokalemia: Secondary | ICD-10-CM | POA: Diagnosis present

## 2022-06-17 DIAGNOSIS — J9601 Acute respiratory failure with hypoxia: Secondary | ICD-10-CM | POA: Diagnosis not present

## 2022-06-17 DIAGNOSIS — F32A Depression, unspecified: Secondary | ICD-10-CM | POA: Diagnosis present

## 2022-06-17 DIAGNOSIS — Z743 Need for continuous supervision: Secondary | ICD-10-CM | POA: Diagnosis not present

## 2022-06-17 DIAGNOSIS — J449 Chronic obstructive pulmonary disease, unspecified: Secondary | ICD-10-CM | POA: Diagnosis not present

## 2022-06-17 DIAGNOSIS — I5032 Chronic diastolic (congestive) heart failure: Secondary | ICD-10-CM | POA: Diagnosis present

## 2022-06-17 DIAGNOSIS — S72142A Displaced intertrochanteric fracture of left femur, initial encounter for closed fracture: Secondary | ICD-10-CM | POA: Diagnosis not present

## 2022-06-17 DIAGNOSIS — I10 Essential (primary) hypertension: Secondary | ICD-10-CM | POA: Diagnosis present

## 2022-06-17 DIAGNOSIS — Y92009 Unspecified place in unspecified non-institutional (private) residence as the place of occurrence of the external cause: Secondary | ICD-10-CM

## 2022-06-17 DIAGNOSIS — S72145D Nondisplaced intertrochanteric fracture of left femur, subsequent encounter for closed fracture with routine healing: Secondary | ICD-10-CM | POA: Diagnosis not present

## 2022-06-17 DIAGNOSIS — F17200 Nicotine dependence, unspecified, uncomplicated: Secondary | ICD-10-CM | POA: Diagnosis present

## 2022-06-17 DIAGNOSIS — R5381 Other malaise: Secondary | ICD-10-CM | POA: Diagnosis not present

## 2022-06-17 DIAGNOSIS — I110 Hypertensive heart disease with heart failure: Secondary | ICD-10-CM | POA: Diagnosis not present

## 2022-06-17 DIAGNOSIS — G9341 Metabolic encephalopathy: Secondary | ICD-10-CM | POA: Diagnosis not present

## 2022-06-17 DIAGNOSIS — W19XXXA Unspecified fall, initial encounter: Secondary | ICD-10-CM | POA: Diagnosis present

## 2022-06-17 DIAGNOSIS — Z8261 Family history of arthritis: Secondary | ICD-10-CM

## 2022-06-17 DIAGNOSIS — H905 Unspecified sensorineural hearing loss: Secondary | ICD-10-CM | POA: Diagnosis present

## 2022-06-17 DIAGNOSIS — M6281 Muscle weakness (generalized): Secondary | ICD-10-CM | POA: Diagnosis not present

## 2022-06-17 DIAGNOSIS — I700 Atherosclerosis of aorta: Secondary | ICD-10-CM | POA: Diagnosis not present

## 2022-06-17 DIAGNOSIS — R55 Syncope and collapse: Secondary | ICD-10-CM | POA: Diagnosis not present

## 2022-06-17 DIAGNOSIS — I959 Hypotension, unspecified: Secondary | ICD-10-CM | POA: Diagnosis present

## 2022-06-17 DIAGNOSIS — K7460 Unspecified cirrhosis of liver: Secondary | ICD-10-CM | POA: Diagnosis present

## 2022-06-17 DIAGNOSIS — K219 Gastro-esophageal reflux disease without esophagitis: Secondary | ICD-10-CM | POA: Diagnosis present

## 2022-06-17 DIAGNOSIS — J439 Emphysema, unspecified: Secondary | ICD-10-CM | POA: Diagnosis present

## 2022-06-17 DIAGNOSIS — E871 Hypo-osmolality and hyponatremia: Secondary | ICD-10-CM | POA: Diagnosis present

## 2022-06-17 DIAGNOSIS — I4891 Unspecified atrial fibrillation: Secondary | ICD-10-CM | POA: Diagnosis not present

## 2022-06-17 DIAGNOSIS — F10231 Alcohol dependence with withdrawal delirium: Secondary | ICD-10-CM | POA: Diagnosis not present

## 2022-06-17 DIAGNOSIS — N138 Other obstructive and reflux uropathy: Secondary | ICD-10-CM | POA: Diagnosis not present

## 2022-06-17 DIAGNOSIS — Y901 Blood alcohol level of 20-39 mg/100 ml: Secondary | ICD-10-CM | POA: Diagnosis present

## 2022-06-17 DIAGNOSIS — E860 Dehydration: Secondary | ICD-10-CM | POA: Diagnosis present

## 2022-06-17 DIAGNOSIS — F17210 Nicotine dependence, cigarettes, uncomplicated: Secondary | ICD-10-CM | POA: Diagnosis not present

## 2022-06-17 DIAGNOSIS — Z20822 Contact with and (suspected) exposure to covid-19: Secondary | ICD-10-CM | POA: Diagnosis not present

## 2022-06-17 DIAGNOSIS — E538 Deficiency of other specified B group vitamins: Secondary | ICD-10-CM | POA: Diagnosis not present

## 2022-06-17 DIAGNOSIS — I480 Paroxysmal atrial fibrillation: Secondary | ICD-10-CM | POA: Diagnosis not present

## 2022-06-17 DIAGNOSIS — M1389 Other specified arthritis, multiple sites: Secondary | ICD-10-CM | POA: Diagnosis not present

## 2022-06-17 DIAGNOSIS — R0689 Other abnormalities of breathing: Secondary | ICD-10-CM | POA: Diagnosis not present

## 2022-06-17 DIAGNOSIS — Z7982 Long term (current) use of aspirin: Secondary | ICD-10-CM

## 2022-06-17 DIAGNOSIS — Z9079 Acquired absence of other genital organ(s): Secondary | ICD-10-CM

## 2022-06-17 DIAGNOSIS — R278 Other lack of coordination: Secondary | ICD-10-CM | POA: Diagnosis not present

## 2022-06-17 DIAGNOSIS — E559 Vitamin D deficiency, unspecified: Secondary | ICD-10-CM | POA: Diagnosis present

## 2022-06-17 DIAGNOSIS — I6529 Occlusion and stenosis of unspecified carotid artery: Secondary | ICD-10-CM | POA: Diagnosis not present

## 2022-06-17 DIAGNOSIS — R531 Weakness: Secondary | ICD-10-CM | POA: Diagnosis not present

## 2022-06-17 DIAGNOSIS — R6889 Other general symptoms and signs: Secondary | ICD-10-CM | POA: Diagnosis not present

## 2022-06-17 DIAGNOSIS — R269 Unspecified abnormalities of gait and mobility: Secondary | ICD-10-CM | POA: Diagnosis not present

## 2022-06-17 DIAGNOSIS — Z808 Family history of malignant neoplasm of other organs or systems: Secondary | ICD-10-CM

## 2022-06-17 DIAGNOSIS — S72142D Displaced intertrochanteric fracture of left femur, subsequent encounter for closed fracture with routine healing: Secondary | ICD-10-CM | POA: Diagnosis not present

## 2022-06-17 DIAGNOSIS — J432 Centrilobular emphysema: Secondary | ICD-10-CM | POA: Diagnosis not present

## 2022-06-17 DIAGNOSIS — Z01818 Encounter for other preprocedural examination: Secondary | ICD-10-CM | POA: Diagnosis not present

## 2022-06-17 DIAGNOSIS — Z4789 Encounter for other orthopedic aftercare: Secondary | ICD-10-CM | POA: Diagnosis not present

## 2022-06-17 DIAGNOSIS — Z79899 Other long term (current) drug therapy: Secondary | ICD-10-CM

## 2022-06-17 DIAGNOSIS — N401 Enlarged prostate with lower urinary tract symptoms: Secondary | ICD-10-CM | POA: Diagnosis present

## 2022-06-17 DIAGNOSIS — R404 Transient alteration of awareness: Secondary | ICD-10-CM | POA: Diagnosis not present

## 2022-06-17 DIAGNOSIS — S72145A Nondisplaced intertrochanteric fracture of left femur, initial encounter for closed fracture: Secondary | ICD-10-CM | POA: Diagnosis not present

## 2022-06-17 DIAGNOSIS — M4812 Ankylosing hyperostosis [Forestier], cervical region: Secondary | ICD-10-CM | POA: Diagnosis not present

## 2022-06-17 DIAGNOSIS — R0600 Dyspnea, unspecified: Secondary | ICD-10-CM | POA: Diagnosis not present

## 2022-06-17 DIAGNOSIS — Z87442 Personal history of urinary calculi: Secondary | ICD-10-CM

## 2022-06-17 DIAGNOSIS — I499 Cardiac arrhythmia, unspecified: Secondary | ICD-10-CM | POA: Diagnosis not present

## 2022-06-17 DIAGNOSIS — F419 Anxiety disorder, unspecified: Secondary | ICD-10-CM | POA: Diagnosis present

## 2022-06-17 DIAGNOSIS — R0789 Other chest pain: Secondary | ICD-10-CM | POA: Diagnosis not present

## 2022-06-17 LAB — RESP PANEL BY RT-PCR (FLU A&B, COVID) ARPGX2
Influenza A by PCR: NEGATIVE
Influenza B by PCR: NEGATIVE
SARS Coronavirus 2 by RT PCR: NEGATIVE

## 2022-06-17 LAB — CBC
HCT: 37.7 % — ABNORMAL LOW (ref 39.0–52.0)
Hemoglobin: 13 g/dL (ref 13.0–17.0)
MCH: 32.7 pg (ref 26.0–34.0)
MCHC: 34.5 g/dL (ref 30.0–36.0)
MCV: 94.7 fL (ref 80.0–100.0)
Platelets: 198 10*3/uL (ref 150–400)
RBC: 3.98 MIL/uL — ABNORMAL LOW (ref 4.22–5.81)
RDW: 13.2 % (ref 11.5–15.5)
WBC: 5.5 10*3/uL (ref 4.0–10.5)
nRBC: 0 % (ref 0.0–0.2)

## 2022-06-17 LAB — COMPREHENSIVE METABOLIC PANEL
ALT: 20 U/L (ref 0–44)
AST: 32 U/L (ref 15–41)
Albumin: 3.8 g/dL (ref 3.5–5.0)
Alkaline Phosphatase: 44 U/L (ref 38–126)
Anion gap: 16 — ABNORMAL HIGH (ref 5–15)
BUN: 15 mg/dL (ref 8–23)
CO2: 22 mmol/L (ref 22–32)
Calcium: 8.4 mg/dL — ABNORMAL LOW (ref 8.9–10.3)
Chloride: 88 mmol/L — ABNORMAL LOW (ref 98–111)
Creatinine, Ser: 1.03 mg/dL (ref 0.61–1.24)
GFR, Estimated: >60 mL/min (ref >60)
Glucose, Bld: 66 mg/dL — ABNORMAL LOW (ref 70–99)
Potassium: 3.5 mmol/L (ref 3.5–5.1)
Sodium: 126 mmol/L — ABNORMAL LOW (ref 135–145)
Total Bilirubin: 1.2 mg/dL (ref 0.3–1.2)
Total Protein: 6.4 g/dL — ABNORMAL LOW (ref 6.5–8.1)

## 2022-06-17 LAB — TSH: TSH: 1.795 u[IU]/mL (ref 0.350–4.500)

## 2022-06-17 LAB — GLUCOSE, CAPILLARY: Glucose-Capillary: 108 mg/dL — ABNORMAL HIGH (ref 70–99)

## 2022-06-17 LAB — ETHANOL: Alcohol, Ethyl (B): 23 mg/dL — ABNORMAL HIGH (ref <10)

## 2022-06-17 LAB — PROTIME-INR
INR: 1 (ref 0.8–1.2)
Prothrombin Time: 13.4 seconds (ref 11.4–15.2)

## 2022-06-17 LAB — MRSA NEXT GEN BY PCR, NASAL: MRSA by PCR Next Gen: NOT DETECTED

## 2022-06-17 MED ORDER — METOPROLOL TARTRATE 5 MG/5ML IV SOLN
2.5000 mg | Freq: Once | INTRAVENOUS | Status: AC
  Administered 2022-06-17: 2.5 mg via INTRAVENOUS
  Filled 2022-06-17: qty 5

## 2022-06-17 MED ORDER — ALBUTEROL SULFATE (2.5 MG/3ML) 0.083% IN NEBU: 2.5000 mg | INHALATION_SOLUTION | Freq: Three times a day (TID) | RESPIRATORY_TRACT | Status: DC | PRN

## 2022-06-17 MED ORDER — SODIUM CHLORIDE 0.9 % IV SOLN: INTRAVENOUS | Status: DC

## 2022-06-17 MED ORDER — METHOCARBAMOL 500 MG PO TABS
500.0000 mg | ORAL_TABLET | Freq: Four times a day (QID) | ORAL | Status: DC | PRN
  Administered 2022-06-17 – 2022-06-25 (×3): 500 mg via ORAL
  Filled 2022-06-17 (×3): qty 1

## 2022-06-17 MED ORDER — MIDODRINE HCL 5 MG PO TABS
10.0000 mg | ORAL_TABLET | Freq: Three times a day (TID) | ORAL | Status: DC
  Administered 2022-06-17 – 2022-06-22 (×13): 10 mg via ORAL
  Filled 2022-06-17 (×15): qty 2

## 2022-06-17 MED ORDER — ADULT MULTIVITAMIN W/MINERALS CH
1.0000 | ORAL_TABLET | Freq: Every day | ORAL | Status: DC
  Administered 2022-06-17 – 2022-06-25 (×8): 1 via ORAL
  Filled 2022-06-17 (×9): qty 1

## 2022-06-17 MED ORDER — CHLORHEXIDINE GLUCONATE CLOTH 2 % EX PADS
6.0000 | MEDICATED_PAD | Freq: Every day | CUTANEOUS | Status: DC
  Administered 2022-06-17 – 2022-06-19 (×3): 6 via TOPICAL

## 2022-06-17 MED ORDER — ORAL CARE MOUTH RINSE: 15.0000 mL | OROMUCOSAL | Status: DC | PRN

## 2022-06-17 MED ORDER — MAGNESIUM SULFATE 2 GM/50ML IV SOLN
2.0000 g | Freq: Once | INTRAVENOUS | Status: AC
  Administered 2022-06-17: 2 g via INTRAVENOUS
  Filled 2022-06-17: qty 50

## 2022-06-17 MED ORDER — ASPIRIN 81 MG PO TBEC
81.0000 mg | DELAYED_RELEASE_TABLET | Freq: Every day | ORAL | Status: DC
  Administered 2022-06-17 – 2022-06-18 (×2): 81 mg via ORAL
  Filled 2022-06-17 (×4): qty 1

## 2022-06-17 MED ORDER — LORAZEPAM 2 MG/ML IJ SOLN
1.0000 mg | INTRAMUSCULAR | Status: AC | PRN
  Administered 2022-06-17 – 2022-06-19 (×2): 1 mg via INTRAVENOUS
  Administered 2022-06-19 – 2022-06-20 (×2): 2 mg via INTRAVENOUS
  Filled 2022-06-17 (×4): qty 1

## 2022-06-17 MED ORDER — FENTANYL CITRATE PF 50 MCG/ML IJ SOSY
25.0000 ug | PREFILLED_SYRINGE | Freq: Once | INTRAMUSCULAR | Status: AC
  Administered 2022-06-17: 25 ug via INTRAVENOUS
  Filled 2022-06-17: qty 1

## 2022-06-17 MED ORDER — METOPROLOL SUCCINATE ER 50 MG PO TB24
75.0000 mg | ORAL_TABLET | Freq: Two times a day (BID) | ORAL | Status: DC
  Administered 2022-06-17 – 2022-06-18 (×3): 75 mg via ORAL
  Filled 2022-06-17 (×4): qty 1

## 2022-06-17 MED ORDER — NICOTINE 14 MG/24HR TD PT24
14.0000 mg | MEDICATED_PATCH | Freq: Every day | TRANSDERMAL | Status: DC
  Administered 2022-06-17 – 2022-06-25 (×9): 14 mg via TRANSDERMAL
  Filled 2022-06-17 (×9): qty 1

## 2022-06-17 MED ORDER — PANTOPRAZOLE SODIUM 40 MG PO TBEC
40.0000 mg | DELAYED_RELEASE_TABLET | Freq: Every day | ORAL | Status: DC
  Administered 2022-06-17 – 2022-06-25 (×8): 40 mg via ORAL
  Filled 2022-06-17 (×9): qty 1

## 2022-06-17 MED ORDER — METOPROLOL TARTRATE 5 MG/5ML IV SOLN
5.0000 mg | Freq: Three times a day (TID) | INTRAVENOUS | Status: DC | PRN
  Administered 2022-06-19: 5 mg via INTRAVENOUS
  Filled 2022-06-17: qty 5

## 2022-06-17 MED ORDER — HEPARIN SODIUM (PORCINE) 5000 UNIT/ML IJ SOLN
5000.0000 [IU] | Freq: Three times a day (TID) | INTRAMUSCULAR | Status: DC
  Administered 2022-06-17 – 2022-06-18 (×3): 5000 [IU] via SUBCUTANEOUS
  Filled 2022-06-17 (×3): qty 1

## 2022-06-17 MED ORDER — QUETIAPINE FUMARATE 100 MG PO TABS
200.0000 mg | ORAL_TABLET | Freq: Two times a day (BID) | ORAL | Status: DC
  Administered 2022-06-17 – 2022-06-25 (×16): 200 mg via ORAL
  Filled 2022-06-17 (×17): qty 2

## 2022-06-17 MED ORDER — METHOCARBAMOL 1000 MG/10ML IJ SOLN: 500.0000 mg | Freq: Four times a day (QID) | INTRAVENOUS | Status: DC | PRN

## 2022-06-17 MED ORDER — SODIUM CHLORIDE 0.9 % IV BOLUS
1000.0000 mL | Freq: Once | INTRAVENOUS | Status: AC
  Administered 2022-06-17: 1000 mL via INTRAVENOUS

## 2022-06-17 MED ORDER — FOLIC ACID 1 MG PO TABS
1.0000 mg | ORAL_TABLET | Freq: Every day | ORAL | Status: DC
  Administered 2022-06-17 – 2022-06-25 (×8): 1 mg via ORAL
  Filled 2022-06-17 (×9): qty 1

## 2022-06-17 MED ORDER — UMECLIDINIUM BROMIDE 62.5 MCG/ACT IN AEPB
1.0000 | INHALATION_SPRAY | Freq: Every day | RESPIRATORY_TRACT | Status: DC
  Administered 2022-06-17 – 2022-06-25 (×9): 1 via RESPIRATORY_TRACT
  Filled 2022-06-17 (×2): qty 7

## 2022-06-17 MED ORDER — HYDROMORPHONE HCL 1 MG/ML IJ SOLN
1.0000 mg | INTRAMUSCULAR | Status: DC | PRN
  Administered 2022-06-17 – 2022-06-23 (×18): 1 mg via INTRAVENOUS
  Filled 2022-06-17 (×19): qty 1

## 2022-06-17 MED ORDER — INFLUENZA VAC A&B SA ADJ QUAD 0.5 ML IM PRSY: 0.5000 mL | PREFILLED_SYRINGE | INTRAMUSCULAR | Status: DC

## 2022-06-17 MED ORDER — ONDANSETRON HCL 4 MG/2ML IJ SOLN
4.0000 mg | Freq: Four times a day (QID) | INTRAMUSCULAR | Status: DC | PRN
  Administered 2022-06-17: 4 mg via INTRAVENOUS
  Filled 2022-06-17: qty 2

## 2022-06-17 MED ORDER — FENTANYL CITRATE PF 50 MCG/ML IJ SOSY
50.0000 ug | PREFILLED_SYRINGE | Freq: Once | INTRAMUSCULAR | Status: AC
  Administered 2022-06-17: 50 ug via INTRAVENOUS
  Filled 2022-06-17: qty 1

## 2022-06-17 MED ORDER — LORAZEPAM 1 MG PO TABS
1.0000 mg | ORAL_TABLET | ORAL | Status: AC | PRN
  Administered 2022-06-18: 1 mg via ORAL
  Filled 2022-06-17: qty 1

## 2022-06-17 MED ORDER — PNEUMOCOCCAL 20-VAL CONJ VACC 0.5 ML IM SUSY: 0.5000 mL | PREFILLED_SYRINGE | INTRAMUSCULAR | Status: DC

## 2022-06-17 MED ORDER — THIAMINE MONONITRATE 100 MG PO TABS
100.0000 mg | ORAL_TABLET | Freq: Every day | ORAL | Status: DC
  Administered 2022-06-17 – 2022-06-25 (×8): 100 mg via ORAL
  Filled 2022-06-17 (×11): qty 1

## 2022-06-17 MED ORDER — OXYCODONE HCL 5 MG PO TABS
5.0000 mg | ORAL_TABLET | ORAL | Status: DC | PRN
  Administered 2022-06-17 – 2022-06-18 (×5): 10 mg via ORAL
  Filled 2022-06-17 (×5): qty 2

## 2022-06-17 MED ORDER — BUPROPION HCL ER (XL) 300 MG PO TB24
300.0000 mg | ORAL_TABLET | Freq: Every day | ORAL | Status: DC
  Administered 2022-06-17 – 2022-06-24 (×8): 300 mg via ORAL
  Filled 2022-06-17: qty 2
  Filled 2022-06-17 (×7): qty 1

## 2022-06-17 MED ORDER — ARFORMOTEROL TARTRATE 15 MCG/2ML IN NEBU
15.0000 ug | INHALATION_SOLUTION | Freq: Two times a day (BID) | RESPIRATORY_TRACT | Status: DC
  Administered 2022-06-17 – 2022-06-25 (×17): 15 ug via RESPIRATORY_TRACT
  Filled 2022-06-17 (×17): qty 2

## 2022-06-17 NOTE — ED Provider Notes (Signed)
[facility] EMERGENCY DEPARTMENT Provider Note   CSN: [id number] Arrival date & time: 06/17/22  3976     History  Chief Complaint  Patient presents with   Loss of Consciousness  Level 5 caveat due to acuity of condition  Jacob Frank is a 71 y.o. male.  The history is provided by the patient and the EMS personnel.  Loss of Consciousness Patient presents from home after a syncopal episode.  Patient reports he has been drinking alcohol, he passed out at home and fell.  He thinks he broke his left hip.  No chest or abdominal pain.  He has had previous episodes of fainting.  EMS reports they were called out for a fall.  On their arrival they noted patient was a complaint of left hip pain and was hypotensive.     Home Medications Prior to Admission medications   Medication Sig Start Date End Date Taking? Authorizing Provider  albuterol (PROAIR HFA) 108 (90 Base) MCG/ACT inhaler 2 PUFFS EVERY FOUR HOURS AS NEEDED FOR WHEEZING 05/31/22   McGowen, Adrian Blackwater, MD  albuterol (PROVENTIL) (2.5 MG/3ML) 0.083% nebulizer solution INHALE 1 VIAL VIA NEBULIZER EVERY 6 HOURS AS NEEDED FOR WHEEZING OR SHORTNESS OF BREATH. Patient taking differently: Take 2.5 mg by nebulization every 6 (six) hours as needed for shortness of breath. INHALE 1 VIAL VIA NEBULIZER EVERY 6 HOURS AS NEEDED FOR WHEEZING OR SHORTNESS OF BREATH. 10/18/21   McGowen, Adrian Blackwater, MD  aspirin EC 81 MG tablet Take 81 mg by mouth daily. Swallow whole.    [provider]  buPROPion (WELLBUTRIN XL) 300 MG 24 hr tablet TAKE (1) TABLET BY MOUTH ONCE DAILY. Patient taking differently: Take 300 mg by mouth daily. TAKE (1) TABLET BY MOUTH ONCE DAILY. 10/18/21   McGowen, Adrian Blackwater, MD  clotrimazole (LOTRIMIN) 1 % cream Apply topically 2 (two) times daily. 01/27/21   [provider]  cyanocobalamin (,VITAMIN B-12,) 1000 MCG/ML injection Inject 1 mL (1,000 mcg total) into the muscle every 30 (thirty) days. 10/18/21   McGowen, Adrian Blackwater,  MD  folic acid (FOLVITE) 1 MG tablet Take 1 tablet (1 mg total) by mouth daily. 10/18/21   McGowen, Adrian Blackwater, MD  Glycopyrrolate-Formoterol (BEVESPI AEROSPHERE) 9-4.8 MCG/ACT AERO 2 puffs bid 03/09/22   McGowen, Adrian Blackwater, MD  ibuprofen (IBU) 800 MG tablet TAKE 1 TABLET BY MOUTH ONCE DAILY AS NEEDED FOR JOINT PAIN. 05/31/22   McGowen, Adrian Blackwater, MD  metoprolol succinate (TOPROL-XL) 100 MG 24 hr tablet TAKE 1 AND 1/2 TABLET BY MOUTH ONCE DAILY WITH MEAL. 05/31/22   McGowen, Adrian Blackwater, MD  Nebulizers (COMPRESSOR/NEBULIZER) MISC 1 unit dose of albuterol via nebulizer q4h prn wheezing and shortness of breath. 07/12/21   McGowen, Adrian Blackwater, MD  nystatin cream (MYCOSTATIN) Apply to groin rash 3 times per day as needed 07/12/21   McGowen, Adrian Blackwater, MD  pantoprazole (PROTONIX) 40 MG tablet Take 1 tablet (40 mg total) by mouth daily. TAKE (1) TABLET BY MOUTH ONCE DAILY. 03/09/22   McGowen, Adrian Blackwater, MD  QUEtiapine (SEROQUEL) 400 MG tablet Take 1 tablet (400 mg total) by mouth 2 (two) times daily. 05/31/22   McGowen, Adrian Blackwater, MD  thiamine 100 MG tablet Take 1 tablet (100 mg total) by mouth daily. 03/09/22   McGowen, Adrian Blackwater, MD      Allergies    Citalopram    Review of Systems   Review of Systems  Unable to perform ROS: Acuity of condition  Cardiovascular:  Positive for syncope.    Physical Exam Updated Vital Signs BP (!) 94/59   Pulse 72   Temp 97.7 F (36.5 C) (Oral)   Resp 18   Ht 1.753 m ('5\' 9"'$ )   Wt 77 kg   SpO2 90%   BMI 25.07 kg/m  Physical Exam CONSTITUTIONAL: Elderly, disheveled, anxious HEAD: Normocephalic/atraumatic EYES: EOMI/PERRL ENMT: Mucous membranes moist, edentulous, no signs of trauma SPINE/BACK:entire spine nontender No bruising/crepitance/stepoffs noted to spine Patient maintained in spinal precautions/logroll utilized CV: Tachycardic LUNGS: Lungs are clear to auscultation bilaterally, no apparent distress ABDOMEN: soft, nontender, no bruising NEURO: Pt is  awake/alert/appropriate, he can move extremities but limited in his left leg due to pain EXTREMITIES: pulses normal/equal, distal pulses equal and intact.  Tenderness with range of motion of left lower hip All other extremities/joints palpated/ranged and nontender SKIN: warm, color normal PSYCH: Anxious  ED Results / Procedures / Treatments   Labs (all labs ordered are listed, but only abnormal results are displayed) Labs Reviewed  COMPREHENSIVE METABOLIC PANEL - Abnormal; Notable for the following components:      Result Value   Sodium 126 (*)    Chloride 88 (*)    Glucose, Bld 66 (*)    Calcium 8.4 (*)    Total Protein 6.4 (*)    Anion gap 16 (*)    All other components within normal limits  CBC - Abnormal; Notable for the following components:   RBC 3.98 (*)    HCT 37.7 (*)    All other components within normal limits  ETHANOL - Abnormal; Notable for the following components:   Alcohol, Ethyl (B) 23 (*)    All other components within normal limits  RESP PANEL BY RT-PCR (FLU A&B, COVID) [id number]  PROTIME-INR  URINALYSIS, ROUTINE W REFLEX MICROSCOPIC  RAPID URINE DRUG SCREEN, HOSP PERFORMED    EKG EKG Interpretation  Date/Time:  Sunday June 17 2022 05:14:07 EDT Ventricular Rate:  175 PR Interval:  81 QRS Duration: 87 QT Interval:  300 QTC Calculation: 512 R Axis:   65 Text Interpretation: Atrial fibrillation Multiform ventricular premature complexes Anterior infarct, old ST depression, probably rate related Confirmed by Ripley Fraise [phone]) on 06/17/2022 5:21:23 AM  Radiology CT CERVICAL SPINE WO CONTRAST  Result Date: 06/17/2022 CLINICAL DATA:  71 year old male with syncope and fall. EXAM: CT CERVICAL SPINE WITHOUT CONTRAST TECHNIQUE: Multidetector CT imaging of the cervical spine was performed without intravenous contrast. Multiplanar CT image reconstructions were also generated. RADIATION DOSE REDUCTION: This exam was performed according to the departmental  dose-optimization program which includes automated exposure control, adjustment of the mA and/or kV according to patient size and/or use of iterative reconstruction technique. COMPARISON:  Head CT today.  Cervical spine CT 03/25/2020. FINDINGS: Alignment: Mild straightening of cervical lordosis since 2021. New rightward flexion of the head and neck. Cervicothoracic junction alignment is within normal limits. Bilateral posterior element alignment is within normal limits. Skull base and vertebrae: Visualized skull base is intact. No atlanto-occipital dissociation. C1 and C2 appear intact [address] and aligned. No acute osseous abnormality identified. Soft tissues and spinal canal: No prevertebral fluid or swelling. No visible canal hematoma. Retropharyngeal carotids with calcified atherosclerosis redemonstrated. Stable visible noncontrast neck soft tissues. Disc levels: Widespread cervical spine degeneration, Diffuse idiopathic skeletal hyperostosis (DISH). Up to mild associated cervical spinal stenosis appears stable since 2021. Upper chest: Severe apical centrilobular and paraseptal emphysema. No apical septal thickening or pleural effusion. Grossly intact visible upper thoracic levels. Negative noncontrast visible thoracic  inlet. IMPRESSION: 1. No acute traumatic injury identified in the cervical spine. 2. Widespread cervical spine degeneration and diffuse idiopathic skeletal hyperostosis (DISH) appear stable since 2021. 3. Severe apical lung Emphysema (ICD10-J43.9). Electronically Signed   By: Genevie Ann M.D.   On: 06/17/2022 06:37   CT HEAD WO CONTRAST  Result Date: 06/17/2022 CLINICAL DATA:  71 year old male with syncope and fall. EXAM: CT HEAD WITHOUT CONTRAST TECHNIQUE: Contiguous axial images were obtained from the base of the skull through the vertex without intravenous contrast. RADIATION DOSE REDUCTION: This exam was performed according to the departmental dose-optimization program which includes automated  exposure control, adjustment of the mA and/or kV according to patient size and/or use of iterative reconstruction technique. COMPARISON:  Brain MRI 04/17/2022.  Head CT 04/17/2022. FINDINGS: Brain: No midline shift, ventriculomegaly, mass effect, evidence of mass lesion, intracranial hemorrhage or evidence of cortically based acute infarction. Stable cerebral volume. Stable Patchy and confluent bilateral cerebral white matter hypodensity. Small cavum septum pellucidum, normal variant. No cortical encephalomalacia identified. Vascular: Calcified atherosclerosis at the skull base. Dolichoectatic basilar artery redemonstrated. No suspicious intracranial vascular hyperdensity. Skull: Stable, intact. Sinuses/Orbits: Visualized paranasal sinuses and mastoids are stable and well aerated. Other: No orbit or scalp soft tissue injury identified. Small volume retained secretions in the nasopharynx. IMPRESSION: 1. No acute intracranial abnormality or acute traumatic injury identified. 2. Chronic cerebral white matter disease, dolichoectatic Basilar artery. Electronically Signed   By: Genevie Ann M.D.   On: 06/17/2022 06:34   DG HIP UNILAT WITH PELVIS 2-3 VIEWS LEFT  Result Date: 06/17/2022 CLINICAL DATA:  71 year old male with syncope and fall. EXAM: DG HIP (WITH OR WITHOUT PELVIS) 2-3V LEFT COMPARISON:  CT Abdomen and Pelvis 02/26/2022. FINDINGS: Left hip series at 0534 hours. Femoral heads are normally located. Pelvis appears stable and intact. SI joints and symphysis appears stable. Grossly intact proximal right femur. Negative visible bowel gas pattern. Heterogeneity of the proximal left femur intertrochanteric segment strongly suggestive of comminuted but nondisplaced intertrochanteric fracture. Left femoral neck appears to remain intact. Proximal shaft distal to the trochanter appears intact. IMPRESSION: Nondisplaced proximal left femur intertrochanteric fracture. Electronically Signed   By: Genevie Ann M.D.   On: 06/17/2022  06:31   DG Chest Port 1 View  Result Date: 06/17/2022 CLINICAL DATA:  71 year old male with syncope and fall. EXAM: PORTABLE CHEST 1 VIEW COMPARISON:  Portable chest 04/17/2022 and earlier. FINDINGS: Portable AP semi upright view at 0536 hours. Larger lung volumes, suggestion of pulmonary hyperinflation as seen in 2017. Normal cardiac size and mediastinal contours. Visualized tracheal air column is within normal limits. Coarse bilateral pulmonary interstitial opacity is chronic to a degree. Pulmonary vascularity does appear increased since July. No pneumothorax, pleural effusion, or confluent pulmonary opacity. Mild eventration of the diaphragm, normal variant. No acute osseous abnormality identified. Paucity of bowel gas in the visible upper abdomen. IMPRESSION: 1. Chronic hyperinflation with acute on chronic pulmonary interstitial opacity. Consider mild or developing interstitial edema, viral/atypical respiratory infection. 2.  No acute traumatic injury identified. Electronically Signed   By: Genevie Ann M.D.   On: 06/17/2022 06:29    Procedures .Critical Care  Performed by: Ripley Fraise, MD Authorized by: Ripley Fraise, MD   Critical care provider statement:    Critical care time (minutes):  80   Critical care start time:  06/17/2022 5:30 AM   Critical care end time:  06/17/2022 6:50 AM   Critical care time was exclusive of:  Separately billable procedures and treating  other patients   Critical care was necessary to treat or prevent imminent or life-threatening deterioration of the following conditions:  Toxidrome, CNS failure or compromise, dehydration, cardiac failure and shock   Critical care was time spent personally by me on the following activities:  Examination of patient, development of treatment plan with patient or surrogate, pulse oximetry, ordering and review of radiographic studies, ordering and review of laboratory studies, ordering and performing treatments and interventions,  re-evaluation of patient's condition, review of old charts, discussions with consultants and obtaining history from patient or surrogate   I assumed direction of critical care for this patient from another provider in my specialty: no     Care discussed with: admitting provider       Medications Ordered in ED Medications  fentaNYL (SUBLIMAZE) injection 25 mcg (has no administration in time range)  sodium chloride 0.9 % bolus 1,000 mL (has no administration in time range)  fentaNYL (SUBLIMAZE) injection 50 mcg (50 mcg Intravenous Given 06/17/22 0531)  sodium chloride 0.9 % bolus 1,000 mL (1,000 mLs Intravenous New Bag/Given 06/17/22 0537)  metoprolol tartrate (LOPRESSOR) injection 2.5 mg (2.5 mg Intravenous Given 06/17/22 0643)  sodium chloride 0.9 % bolus 1,000 mL (0 mLs Intravenous Stopped 06/17/22 0648)  metoprolol tartrate (LOPRESSOR) injection 2.5 mg (2.5 mg Intravenous Given 06/17/22 9892)    ED Course/ Medical Decision Making/ A&P Clinical Course as of 06/17/22 1194  [facility] Jun 17, 2022  0526 Patient reports history of COPD, alcohol use disorder as well as with static hypotension presents after fall.  He is tachycardic but appears to be in A-fib.  He has a history of this in the past but is not on anticoagulation due to fall risk and alcohol abuse.  Imaging and labs are pending at this time [DW]  0550 Sodium(!): 126 Acute on chronic hyponatremia [DW]  0640 Patient still tachycardic with atrial fibrillation.  Manual blood pressure was in the 90s.  Patient reports he has not taken his metoprolol in the past month.  He has been given 2 L IV fluid.  We will give an additional third liter, and then give a small dose of metoprolol to help control his heart rate [DW]  0640 Patient found to have a left proximal femur fracture.  We will consult orthopedics [DW]  0713 Heart rate is starting to improve with metoprolol blood pressure starting to stabilize.  Patient reports pain only in left hip.  He has  no abdominal pain or chest pain. [DW]  1740 Consulted Dr. Aline Brochure with orthopedics.  Initial plan will be to admit to this hospital under the hospitalist service but no plans for operative management today. [DW]  0728 D/w dr Dyann Kief with hospitalist [DW]    Clinical Course User Index [DW] Ripley Fraise, MD         Glasgow Coma Scale Score: 15      NEXUS Criteria Score: 2            Medical Decision Making Amount and/or Complexity of Data Reviewed Labs: ordered. Decision-making details documented in ED Course. Radiology: ordered.  Risk Prescription drug management. Decision regarding hospitalization.   This patient presents to the ED for concern of syncope, this involves an extensive number of treatment options, and is a complaint that carries with it a high risk of complications and morbidity.  The differential diagnosis includes but is not limited to cardiac dysrhythmia, orthostatic hypotension, CVA  Comorbidities that complicate the patient evaluation: Patient's presentation is complicated by their  history of orthostatic hypotension, COPD  Social Determinants of Health: Patient's  alcohol use disorder   increases the complexity of managing their presentation  Additional history obtained: Additional history obtained from EMS discussed with paramedic at the bedside Records reviewed Primary Care Documents  Lab Tests: I Ordered, and personally interpreted labs.  The pertinent results include: Hyponatremia and dehydration  Imaging Studies ordered: I ordered imaging studies including CT scan head and C-spine and X-ray chest and left hip   I independently visualized and interpreted imaging which showed left intertrochanteric fracture, no other acute findings I agree with the radiologist interpretation  Cardiac Monitoring: The patient was maintained on a cardiac monitor.  I personally viewed and interpreted the cardiac monitor which showed an underlying rhythm of:  Atrial  Fibrillation  Medicines ordered and prescription drug management: I ordered medication including fentanyl for pain Reevaluation of the patient after these medicines showed that the patient    stayed the same Lopressor for atrial fibrillation with RVR  Critical Interventions:  IV fluids, Lopressor  Consultations Obtained: I requested consultation with the admitting physician Dr. Dyann Kief with [facility] and consultant Dr. Aline Brochure , and discussed  findings as well as pertinent plan - they recommend: We will admit for hip fracture as well as atrial fibrillation  Reevaluation: After the interventions noted above, I reevaluated the patient and found that they have :improved  Complexity of problems addressed: Patient's presentation is most consistent with  acute presentation with potential threat to life or bodily function  Disposition: After consideration of the diagnostic results and the patient's response to treatment,  I feel that the patent would benefit from admission   .    Patient awake and alert.  He is only having pain in his left hip.  He denies any chest or abdominal pain.  No other signs of acute traumatic injury.  CT head and C-spine was negative. Patient endorsed to hospitalist team for admission.       Final Clinical Impression(s) / ED Diagnoses Final diagnoses:  Syncope and collapse  Dehydration  Hyponatremia  Atrial fibrillation with rapid ventricular response (HCC)  Closed nondisplaced intertrochanteric fracture of left femur, initial encounter [facility])    Rx / [facility] Orders ED Discharge Orders     None         Ripley Fraise, MD 06/17/22 0730

## 2022-06-17 NOTE — Assessment & Plan Note (Signed)
-  Currently stable and not demonstrating exacerbation -Continue as needed bronchodilators -Good saturation on room air appreciated on today's exam. -Patient did not use oxygen at baseline. -Patient educated regarding smoking cessation.

## 2022-06-17 NOTE — Progress Notes (Signed)
Patient ID: Jacob Frank, male   DOB: Apr 19, 1951, 71 y.o.   MRN: [id number]  71 year old male with a left intertrochanteric hip fracture presented to the emergency room unable to weight-bear and with several metabolic issues related to his underlying alcoholism.  He was admitted to the ICU for medical management  Based on his condition and the operating room schedule, the surgical fixation will be performed on Tuesday afternoon  BP 119/87   Pulse 99   Temp 98.6 F (37 C) (Oral)   Resp 13   Ht '5\' 5"'$  (1.651 m)   Wt 78.2 kg   SpO2 (!) 89%   BMI 28.69 kg/m      Latest Ref Rng & Units 06/17/2022    5:15 AM 01/26/2022    5:05 AM 01/25/2022    4:24 AM  BMP  Glucose 70 - 99 mg/dL 66  85  99   BUN 8 - 23 mg/dL '15  13  15   '$ Creatinine 0.61 - 1.24 mg/dL 1.03  0.84  0.81   Sodium 135 - 145 mmol/L 126  130  134   Potassium 3.5 - 5.1 mmol/L 3.5  4.8  4.1   Chloride 98 - 111 mmol/L 88  100  101   CO2 22 - 32 mmol/L '22  21  24   '$ Calcium 8.9 - 10.3 mg/dL 8.4  8.3  8.8       Latest Ref Rng & Units 06/17/2022    5:15 AM 01/21/2022    1:28 AM 01/20/2022    4:20 AM  CBC  WBC 4.0 - 10.5 K/uL 5.5  4.9  4.4   Hemoglobin 13.0 - 17.0 g/dL 13.0  12.7  12.6   Hematocrit 39.0 - 52.0 % 37.7  37.5  35.8   Platelets 150 - 400 K/uL 198  175  181    Alcohol level was 23  Patient is vitamin D deficient

## 2022-06-17 NOTE — Assessment & Plan Note (Addendum)
-  Continue analgesic therapy and supportive care -Orthopedic surgery has been consulted and there is plan for surgical fixation/repair on 06/19/2022 -After surgical intervention physical therapy/Occupational Therapy evaluation will be requested to determine safe discharge plan for patient.. -Patient expressed that he might require nursing home placement for rehabilitation and he is open to that.

## 2022-06-17 NOTE — Assessment & Plan Note (Addendum)
-  Cessation counseling provided -Continue nicotine patch.

## 2022-06-17 NOTE — ED Notes (Signed)
Pt becoming more restless. Reassessing CIWA

## 2022-06-17 NOTE — ED Triage Notes (Signed)
Pt endorsing consuming ETOH prior to fall at home tonight.
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
## 2022-06-17 NOTE — ED Notes (Signed)
Patient transported to CT 

## 2022-06-17 NOTE — Assessment & Plan Note (Signed)
-   Present at time of admission -Patient chronically on metoprolol for rate control -Not a candidate for anticoagulation -Continue aspirin -Receive IV metoprolol with good response -Will resume home oral extended release metoprolol and follow his rates; given initial presentation of A-fib with RVR will place in a stepdown bed with intention to have close monitoring and if needed start drip for rate control. -Checking TSH; follow electrolytes and replete as needed with goals for potassium above 4 and magnesium above 2.

## 2022-06-17 NOTE — Assessment & Plan Note (Addendum)
-  Soft on presentation; patient with underlying history of orthostatic hypotension -Continue the use of midodrine -Continue to maintain adequate hydration -Continue to follow vital signs.

## 2022-06-17 NOTE — Plan of Care (Signed)
Patient admitted today with a traumatic fall. Plan for surgery Tuesday.   Problem: Education: Goal: Knowledge of General Education information will improve Description: Including pain rating scale, medication(s)/side effects and non-pharmacologic comfort measures Outcome: Progressing   Problem: Health Behavior/Discharge Planning: Goal: Ability to manage health-related needs will improve Outcome: Progressing   Problem: Clinical Measurements: Goal: Ability to maintain clinical measurements within normal limits will improve Outcome: Progressing Goal: Will remain free from infection Outcome: Progressing Goal: Diagnostic test results will improve Outcome: Progressing Goal: Respiratory complications will improve Outcome: Progressing Goal: Cardiovascular complication will be avoided Outcome: Progressing   Problem: Activity: Goal: Risk for activity intolerance will decrease Outcome: Progressing   Problem: Nutrition: Goal: Adequate nutrition will be maintained Outcome: Progressing   Problem: Coping: Goal: Level of anxiety will decrease Outcome: Progressing   Problem: Elimination: Goal: Will not experience complications related to bowel motility Outcome: Progressing Goal: Will not experience complications related to urinary retention Outcome: Progressing   Problem: Pain Managment: Goal: General experience of comfort will improve Outcome: Progressing   Problem: Safety: Goal: Ability to remain free from injury will improve Outcome: Progressing   Problem: Skin Integrity: Goal: Risk for impaired skin integrity will decrease Outcome: Progressing

## 2022-06-17 NOTE — Assessment & Plan Note (Signed)
-  vitamin D level 26.17 -Continue weekly oral supplementation.

## 2022-06-17 NOTE — ED Notes (Signed)
Respiratory at bedside.

## 2022-06-17 NOTE — ED Notes (Signed)
Pt repositioned and given water.

## 2022-06-17 NOTE — ED Triage Notes (Signed)
Pt arrived from home via [facility] for syncopal episode tonight, falling and injuring left hip. Pt alert and oriented in Triage. EDP at bedside.

## 2022-06-17 NOTE — Assessment & Plan Note (Signed)
-  Chronic in the setting of alcohol abuse -At time of admission sodium level was lower than his baseline; after fluid resuscitation sodium level is 129. -Continue to follow electrolytes trend.  -Alcohol cessation and adequate nutrition discussed with patient.

## 2022-06-17 NOTE — Assessment & Plan Note (Signed)
-  Continue outpatient monthly injection for B12 repletion.

## 2022-06-18 DIAGNOSIS — R55 Syncope and collapse: Secondary | ICD-10-CM | POA: Diagnosis not present

## 2022-06-18 DIAGNOSIS — S72145A Nondisplaced intertrochanteric fracture of left femur, initial encounter for closed fracture: Secondary | ICD-10-CM | POA: Diagnosis not present

## 2022-06-18 DIAGNOSIS — J449 Chronic obstructive pulmonary disease, unspecified: Secondary | ICD-10-CM | POA: Diagnosis not present

## 2022-06-18 DIAGNOSIS — I4891 Unspecified atrial fibrillation: Secondary | ICD-10-CM | POA: Diagnosis not present

## 2022-06-18 LAB — MAGNESIUM: Magnesium: 1.9 mg/dL (ref 1.7–2.4)

## 2022-06-18 LAB — CBC
HCT: 34.4 % — ABNORMAL LOW (ref 39.0–52.0)
Hemoglobin: 11.3 g/dL — ABNORMAL LOW (ref 13.0–17.0)
MCH: 32.8 pg (ref 26.0–34.0)
MCHC: 32.8 g/dL (ref 30.0–36.0)
MCV: 100 fL (ref 80.0–100.0)
Platelets: 164 10*3/uL (ref 150–400)
RBC: 3.44 MIL/uL — ABNORMAL LOW (ref 4.22–5.81)
RDW: 13.5 % (ref 11.5–15.5)
WBC: 5.8 10*3/uL (ref 4.0–10.5)
nRBC: 0 % (ref 0.0–0.2)

## 2022-06-18 LAB — BASIC METABOLIC PANEL
Anion gap: 5 (ref 5–15)
BUN: 13 mg/dL (ref 8–23)
CO2: 23 mmol/L (ref 22–32)
Calcium: 7.5 mg/dL — ABNORMAL LOW (ref 8.9–10.3)
Chloride: 101 mmol/L (ref 98–111)
Creatinine, Ser: 0.74 mg/dL (ref 0.61–1.24)
GFR, Estimated: >60 mL/min (ref >60)
Glucose, Bld: 122 mg/dL — ABNORMAL HIGH (ref 70–99)
Potassium: 3.6 mmol/L (ref 3.5–5.1)
Sodium: 129 mmol/L — ABNORMAL LOW (ref 135–145)

## 2022-06-18 LAB — VITAMIN D 25 HYDROXY (VIT D DEFICIENCY, FRACTURES): Vit D, 25-Hydroxy: 26.17 ng/mL — ABNORMAL LOW (ref 30–100)

## 2022-06-18 MED ORDER — VITAMIN D (ERGOCALCIFEROL) 1.25 MG (50000 UNIT) PO CAPS
50000.0000 [IU] | ORAL_CAPSULE | ORAL | Status: DC
  Administered 2022-06-18: 50000 [IU] via ORAL
  Filled 2022-06-18: qty 1

## 2022-06-18 MED ORDER — HEPARIN SODIUM (PORCINE) 5000 UNIT/ML IJ SOLN
5000.0000 [IU] | Freq: Three times a day (TID) | INTRAMUSCULAR | Status: AC
  Administered 2022-06-18: 5000 [IU] via SUBCUTANEOUS
  Filled 2022-06-18: qty 1

## 2022-06-18 NOTE — Progress Notes (Signed)
Patient trasnferred up from ICU in stable condition.  Patient is on 2 liters of oxygen. Paitent does have dry cough.   Patient vitals stable.  Patient still c/o severe pain to left hip.  Patient scheduled for surgery on Tuesday. Pain medication given per MD order.

## 2022-06-18 NOTE — TOC Progression Note (Signed)
Transition of Care [facility]) - Progression Note    Patient Details  Name: Jacob Frank MRN: [id number] Date of Birth: 08/10/1951  Transition of Care [facility]) CM/SW Contact  Salome Arnt, [place] Phone Number: 06/18/2022, 10:41 AM  Clinical Narrative:   Transition of Care [facility]) Screening Note   Patient Details  Name: Jacob Frank Date of Birth: 01-02-1951   Transition of Care [facility]) CM/SW Contact:    Salome Arnt, LCSW Phone Number: 06/18/2022, 10:41 AM  OR tomorrow. TOC will follow up after PT evaluation to discuss recommendations.   Transition of Care Department [facility]) has reviewed patient and no TOC needs have been identified at this time. We will continue to monitor patient advancement through interdisciplinary progression rounds. If new patient transition needs arise, please place a TOC consult.         Barriers to Discharge: Continued Medical Work up  Expected Discharge Plan and Services                                                 Social Determinants of Health (SDOH) Interventions    Readmission Risk Interventions    12/28/2020   12:55 PM  Readmission Risk Prevention Plan  Transportation Screening Complete  Home Care Screening Complete  Medication Review (RN CM) Complete
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
## 2022-06-18 NOTE — Progress Notes (Signed)
Progress Note   Patient: Jacob Frank RSW:[id number] DOB: 07/10/1951 DOA: 06/17/2022     1 DOS: the patient was seen and examined on 06/18/2022   Brief hospital course: DIARRA KOS is a 71 y.o. male with medical history significant of alcohol abuse, paroxysmal atrial fibrillation, orthostatic hypotension, gastroesophageal reflux disease, COPD, chronic diastolic heart failure, tobacco abuse, vitamin D 12 deficiency and vitamin D deficiency; presented to the hospital after experiencing syncope event at home landing on his left side and having difficulties bearing weight and experiencing uncontrolled pain. Patient expressed multiple episodes of passing out especially when changing positions rapidly and using alcohol; patient reports drinking some beers with his roommate the night prior to falling.  There was no chest pain, palpitations, headaches, focal weakness, dizziness or lightheadedness; nausea, vomiting, shortness of breath or any other complaints.   In the ED work-up demonstrated left intertrochanteric fracture; patient found to be in A-fib with RVR with an alcohol level of 23.  He was also dehydrated and hyponatremic.  Fluid resuscitation, metoprolol IV as rate control agent were provided as long with analgesic therapy for left hip pain.  Orthopedic service consulted and TRH contacted to place patient in the hospital for further evaluation and management.  Assessment and Plan: * Intertrochanteric fracture of left hip (HCC) -Continue analgesic therapy and supportive care -Orthopedic surgery has been consulted and there is plan for surgical fixation/repair on 06/19/2022 -After surgical intervention physical therapy/Occupational Therapy evaluation will be requested to determine safe discharge plan for patient.. -Patient expressed that he might require nursing home placement for rehabilitation and he is open to that.  Vitamin D deficiency -vitamin D level 26.17 -Continue weekly oral  supplementation.  Atrial fibrillation with RVR ([place]) - Present at time of admission -After hydration and resumption of home beta-blocker and adjusted dose patient's RVR component has resolved. -Continue telemetry monitoring. -Continue to follow electrolytes and replete as needed (goal is for potassium above 4 and magnesium above 2).  Hyponatremia -Chronic in the setting of alcohol abuse -At time of admission sodium level was lower than his baseline; after fluid resuscitation sodium level is 129. -Continue to follow electrolytes trend.  -Alcohol cessation and adequate nutrition discussed with patient.  Vitamin B12 deficiency -Continue outpatient monthly injection for B12 repletion.  Tobacco dependence -Cessation counseling provided -Continue nicotine patch.  HTN (hypertension), benign -Soft on presentation; patient with underlying history of orthostatic hypotension -Continue the use of midodrine -Continue to maintain adequate hydration -Continue to follow vital signs.   COPD (chronic obstructive pulmonary disease) (HCC) -Currently stable and not demonstrating exacerbation -Continue as needed bronchodilators -Good saturation on 2 L nasal cannula supplementation; mainly added as patient was desaturating at night while receiving the medication. -Patient did not use oxygen at baseline. -Patient educated regarding smoking cessation.   Subjective:  Afebrile, no palpitations, no nausea, no vomiting, no using accessory muscle.  Hemodynamically stable and not demonstrating acute alcohol withdrawals.  Complaining of left leg pain.  Physical Exam: Vitals:   06/18/22 0600 06/18/22 0823 06/18/22 1000 06/18/22 1419  BP: 108/74  93/72 103/73  Pulse: 80 85 77 79  Resp: '16 16 10 20  '$ Temp: 98.7 F (37.1 C)  (!) 97.4 F (36.3 C) 98.3 F (36.8 C)  TempSrc:   Oral   SpO2: 91% 92% 93% 90%  Weight:      Height:       General exam: Alert, awake, oriented x 3; no acute withdrawal  appreciated; reports no shortness of breath  or chest pain.  Patient denies palpitation.  Still having pain in his left hip. Respiratory system: Clear to auscultation. Respiratory effort normal.  No using accessory muscles.  2 L nasal cannula supplementation added at nighttime as patient was controlled pain his oxygen level after receiving pain medications and falling asleep. Cardiovascular system: Rate controlled, no rubs, no gallops, no JVD. Gastrointestinal system: Abdomen is nondistended, soft and nontender. No organomegaly or masses felt. Normal bowel sounds heard. Central nervous system: Alert and oriented. No focal neurological deficits. Extremities: No cyanosis or clubbing; trace edema appreciated bilaterally.  Patient reports pain with movement on his left leg. Skin: No petechiae. Psychiatry: Judgement and insight appear normal. Mood & affect appropriate.   Data Reviewed: Magnesium 1.9 CBC: WBCs 5.8, hemoglobin 11.3 and platelet count 325 K Basic metabolic: Sodium 498, potassium 3.6, chloride 101, bicarb 23, BUN 13, creatinine 0.74 and GFR more than 60.  Family Communication: None at bedside.  Disposition: Status is: Inpatient Remains inpatient appropriate because: Pending treatment for left hip fracture.    Planned Discharge Destination: Determined; will most likely will require skilled nursing facility for rehabilitation and conditioning.   Author: Barton Dubois, MD 06/18/2022 4:52 PM  For on call review [email or web address].

## 2022-06-19 ENCOUNTER — Inpatient Hospital Stay (HOSPITAL_COMMUNITY): Payer: Medicare Other

## 2022-06-19 ENCOUNTER — Inpatient Hospital Stay (HOSPITAL_COMMUNITY): Payer: Medicare Other | Admitting: Anesthesiology

## 2022-06-19 ENCOUNTER — Encounter (HOSPITAL_COMMUNITY): Payer: Self-pay | Admitting: Internal Medicine

## 2022-06-19 ENCOUNTER — Other Ambulatory Visit: Payer: Self-pay

## 2022-06-19 ENCOUNTER — Encounter (HOSPITAL_COMMUNITY)
Admission: EM | Disposition: A | Discharge status: Skilled Nursing Facility | Payer: Self-pay | Source: Home / Self Care | Attending: Family Medicine

## 2022-06-19 DIAGNOSIS — S72145A Nondisplaced intertrochanteric fracture of left femur, initial encounter for closed fracture: Secondary | ICD-10-CM | POA: Diagnosis not present

## 2022-06-19 DIAGNOSIS — I4891 Unspecified atrial fibrillation: Secondary | ICD-10-CM | POA: Diagnosis not present

## 2022-06-19 DIAGNOSIS — F17210 Nicotine dependence, cigarettes, uncomplicated: Secondary | ICD-10-CM

## 2022-06-19 DIAGNOSIS — S72142A Displaced intertrochanteric fracture of left femur, initial encounter for closed fracture: Secondary | ICD-10-CM

## 2022-06-19 DIAGNOSIS — R55 Syncope and collapse: Secondary | ICD-10-CM | POA: Diagnosis not present

## 2022-06-19 DIAGNOSIS — J449 Chronic obstructive pulmonary disease, unspecified: Secondary | ICD-10-CM | POA: Diagnosis not present

## 2022-06-19 DIAGNOSIS — I10 Essential (primary) hypertension: Secondary | ICD-10-CM

## 2022-06-19 HISTORY — PX: COMPRESSION HIP SCREW: SHX1386

## 2022-06-19 LAB — URINALYSIS, ROUTINE W REFLEX MICROSCOPIC
Bacteria, UA: NONE SEEN
Bilirubin Urine: NEGATIVE
Glucose, UA: 50 mg/dL — AB
Ketones, ur: 5 mg/dL — AB
Leukocytes,Ua: NEGATIVE
Nitrite: NEGATIVE
Protein, ur: NEGATIVE mg/dL
Specific Gravity, Urine: 1.014 (ref 1.005–1.030)
pH: 6 (ref 5.0–8.0)

## 2022-06-19 LAB — RAPID URINE DRUG SCREEN, HOSP PERFORMED
Amphetamines: NOT DETECTED
Barbiturates: NOT DETECTED
Benzodiazepines: NOT DETECTED
Cocaine: NOT DETECTED
Opiates: POSITIVE — AB
Tetrahydrocannabinol: NOT DETECTED

## 2022-06-19 LAB — SURGICAL PCR SCREEN
MRSA, PCR: NEGATIVE
Staphylococcus aureus: NEGATIVE

## 2022-06-19 SURGERY — COMPRESSION HIP
Anesthesia: General | Site: Hip | Laterality: Left

## 2022-06-19 MED ORDER — IPRATROPIUM-ALBUTEROL 0.5-2.5 (3) MG/3ML IN SOLN
3.0000 mL | Freq: Once | RESPIRATORY_TRACT | Status: AC
  Administered 2022-06-19: 3 mL via RESPIRATORY_TRACT

## 2022-06-19 MED ORDER — DEXAMETHASONE SODIUM PHOSPHATE 10 MG/ML IJ SOLN
INTRAMUSCULAR | Status: DC | PRN
  Administered 2022-06-19: 10 mg via INTRAVENOUS

## 2022-06-19 MED ORDER — HYDROCODONE-ACETAMINOPHEN 5-325 MG PO TABS
1.0000 | ORAL_TABLET | ORAL | Status: DC | PRN
  Administered 2022-06-20 – 2022-06-22 (×7): 1 via ORAL
  Administered 2022-06-23 – 2022-06-25 (×7): 2 via ORAL
  Filled 2022-06-19 (×2): qty 2
  Filled 2022-06-19: qty 1
  Filled 2022-06-19 (×2): qty 2
  Filled 2022-06-19 (×3): qty 1
  Filled 2022-06-19 (×2): qty 2
  Filled 2022-06-19 (×3): qty 1
  Filled 2022-06-19: qty 2
  Filled 2022-06-19: qty 1

## 2022-06-19 MED ORDER — ACETAMINOPHEN 325 MG PO TABS
325.0000 mg | ORAL_TABLET | Freq: Four times a day (QID) | ORAL | Status: DC | PRN
  Administered 2022-06-22: 650 mg via ORAL
  Filled 2022-06-19: qty 2

## 2022-06-19 MED ORDER — ONDANSETRON HCL 4 MG PO TABS: 4.0000 mg | ORAL_TABLET | Freq: Four times a day (QID) | ORAL | Status: DC | PRN

## 2022-06-19 MED ORDER — FENTANYL CITRATE PF 50 MCG/ML IJ SOSY: 25.0000 ug | PREFILLED_SYRINGE | INTRAMUSCULAR | Status: DC | PRN

## 2022-06-19 MED ORDER — METOPROLOL SUCCINATE ER 50 MG PO TB24
150.0000 mg | ORAL_TABLET | Freq: Every day | ORAL | Status: DC
  Administered 2022-06-19 – 2022-06-25 (×7): 150 mg via ORAL
  Filled 2022-06-19 (×7): qty 3

## 2022-06-19 MED ORDER — PHENYLEPHRINE 80 MCG/ML (10ML) SYRINGE FOR IV PUSH (FOR BLOOD PRESSURE SUPPORT)
PREFILLED_SYRINGE | INTRAVENOUS | Status: AC
  Filled 2022-06-19: qty 10

## 2022-06-19 MED ORDER — CEFAZOLIN SODIUM-DEXTROSE 2-4 GM/100ML-% IV SOLN
INTRAVENOUS | Status: AC
  Filled 2022-06-19: qty 100

## 2022-06-19 MED ORDER — ONDANSETRON HCL 4 MG/2ML IJ SOLN: 4.0000 mg | Freq: Once | INTRAMUSCULAR | Status: DC | PRN

## 2022-06-19 MED ORDER — IPRATROPIUM-ALBUTEROL 0.5-2.5 (3) MG/3ML IN SOLN
RESPIRATORY_TRACT | Status: AC
  Filled 2022-06-19: qty 3

## 2022-06-19 MED ORDER — PHENOL 1.4 % MT LIQD: 1.0000 | OROMUCOSAL | Status: DC | PRN

## 2022-06-19 MED ORDER — MENTHOL 3 MG MT LOZG: 1.0000 | LOZENGE | OROMUCOSAL | Status: DC | PRN

## 2022-06-19 MED ORDER — BUPIVACAINE LIPOSOME 1.3 % IJ SUSP
INTRAMUSCULAR | Status: AC
  Filled 2022-06-19: qty 20

## 2022-06-19 MED ORDER — ONDANSETRON HCL 4 MG/2ML IJ SOLN
4.0000 mg | Freq: Four times a day (QID) | INTRAMUSCULAR | Status: DC | PRN
  Filled 2022-06-19: qty 2

## 2022-06-19 MED ORDER — FENTANYL CITRATE PF 50 MCG/ML IJ SOSY
25.0000 ug | PREFILLED_SYRINGE | INTRAMUSCULAR | Status: DC | PRN
  Administered 2022-06-19: 50 ug via INTRAVENOUS

## 2022-06-19 MED ORDER — CEFAZOLIN SODIUM-DEXTROSE 2-4 GM/100ML-% IV SOLN
2.0000 g | INTRAVENOUS | Status: AC
  Administered 2022-06-19: 2 g via INTRAVENOUS

## 2022-06-19 MED ORDER — LACTATED RINGERS IV SOLN: INTRAVENOUS | Status: DC

## 2022-06-19 MED ORDER — POVIDONE-IODINE 10 % EX SWAB: 2.0000 | Freq: Once | CUTANEOUS | Status: DC

## 2022-06-19 MED ORDER — ORAL CARE MOUTH RINSE: 15.0000 mL | Freq: Once | OROMUCOSAL | Status: AC

## 2022-06-19 MED ORDER — ONDANSETRON HCL 4 MG/2ML IJ SOLN
INTRAMUSCULAR | Status: DC | PRN
  Administered 2022-06-19: 4 mg via INTRAVENOUS

## 2022-06-19 MED ORDER — DOCUSATE SODIUM 100 MG PO CAPS
100.0000 mg | ORAL_CAPSULE | Freq: Two times a day (BID) | ORAL | Status: DC
  Administered 2022-06-20 – 2022-06-23 (×8): 100 mg via ORAL
  Filled 2022-06-19 (×8): qty 1

## 2022-06-19 MED ORDER — METOCLOPRAMIDE HCL 5 MG/ML IJ SOLN: 5.0000 mg | Freq: Three times a day (TID) | INTRAMUSCULAR | Status: DC | PRN

## 2022-06-19 MED ORDER — METOCLOPRAMIDE HCL 10 MG PO TABS: 5.0000 mg | ORAL_TABLET | Freq: Three times a day (TID) | ORAL | Status: DC | PRN

## 2022-06-19 MED ORDER — FENTANYL CITRATE (PF) 100 MCG/2ML IJ SOLN
INTRAMUSCULAR | Status: DC | PRN
  Administered 2022-06-19 (×2): 50 ug via INTRAVENOUS
  Administered 2022-06-19: 25 ug via INTRAVENOUS
  Administered 2022-06-19: 50 ug via INTRAVENOUS
  Administered 2022-06-19: 25 ug via INTRAVENOUS
  Administered 2022-06-19: 50 ug via INTRAVENOUS

## 2022-06-19 MED ORDER — PHENYLEPHRINE HCL-NACL 20-0.9 MG/250ML-% IV SOLN
INTRAVENOUS | Status: AC
  Filled 2022-06-19: qty 250

## 2022-06-19 MED ORDER — CHLORHEXIDINE GLUCONATE 4 % EX LIQD
60.0000 mL | Freq: Once | CUTANEOUS | Status: AC
  Administered 2022-06-19: 4 via TOPICAL
  Filled 2022-06-19: qty 15

## 2022-06-19 MED ORDER — CHLORHEXIDINE GLUCONATE 0.12 % MT SOLN
15.0000 mL | Freq: Once | OROMUCOSAL | Status: AC
  Administered 2022-06-19: 15 mL via OROMUCOSAL

## 2022-06-19 MED ORDER — TRAMADOL HCL 50 MG PO TABS
50.0000 mg | ORAL_TABLET | Freq: Four times a day (QID) | ORAL | Status: DC
  Administered 2022-06-19 – 2022-06-20 (×3): 50 mg via ORAL
  Filled 2022-06-19 (×3): qty 1

## 2022-06-19 MED ORDER — FENTANYL CITRATE (PF) 250 MCG/5ML IJ SOLN
INTRAMUSCULAR | Status: AC
  Filled 2022-06-19: qty 5

## 2022-06-19 MED ORDER — PHENYLEPHRINE 80 MCG/ML (10ML) SYRINGE FOR IV PUSH (FOR BLOOD PRESSURE SUPPORT)
PREFILLED_SYRINGE | INTRAVENOUS | Status: DC | PRN
  Administered 2022-06-19 (×3): 160 ug via INTRAVENOUS
  Administered 2022-06-19 (×2): 80 ug via INTRAVENOUS

## 2022-06-19 MED ORDER — LIDOCAINE HCL (CARDIAC) PF 100 MG/5ML IV SOSY
PREFILLED_SYRINGE | INTRAVENOUS | Status: DC | PRN
  Administered 2022-06-19: 100 mg via INTRATRACHEAL

## 2022-06-19 MED ORDER — OXYCODONE HCL 5 MG/5ML PO SOLN: 5.0000 mg | Freq: Once | ORAL | Status: DC | PRN

## 2022-06-19 MED ORDER — BUPIVACAINE HCL (PF) 0.5 % IJ SOLN
INTRAMUSCULAR | Status: AC
  Filled 2022-06-19: qty 30

## 2022-06-19 MED ORDER — OXYCODONE HCL 5 MG PO TABS: 5.0000 mg | ORAL_TABLET | Freq: Once | ORAL | Status: DC | PRN

## 2022-06-19 MED ORDER — SODIUM CHLORIDE 0.9 % IV SOLN: INTRAVENOUS | Status: DC

## 2022-06-19 MED ORDER — PROPOFOL 10 MG/ML IV BOLUS
INTRAVENOUS | Status: DC | PRN
  Administered 2022-06-19: 100 mg via INTRAVENOUS

## 2022-06-19 MED ORDER — CEFAZOLIN SODIUM-DEXTROSE 2-4 GM/100ML-% IV SOLN
2.0000 g | Freq: Four times a day (QID) | INTRAVENOUS | Status: AC
  Administered 2022-06-19 – 2022-06-20 (×2): 2 g via INTRAVENOUS
  Filled 2022-06-19 (×2): qty 100

## 2022-06-19 MED ORDER — BUPIVACAINE-EPINEPHRINE (PF) 0.5% -1:200000 IJ SOLN
INTRAMUSCULAR | Status: DC | PRN
  Administered 2022-06-19: 30 mL

## 2022-06-19 MED ORDER — 0.9 % SODIUM CHLORIDE (POUR BTL) OPTIME
TOPICAL | Status: DC | PRN
  Administered 2022-06-19: 1000 mL

## 2022-06-19 MED ORDER — ASPIRIN 325 MG PO TBEC
325.0000 mg | DELAYED_RELEASE_TABLET | Freq: Every day | ORAL | Status: DC
  Administered 2022-06-20: 325 mg via ORAL
  Filled 2022-06-19: qty 1

## 2022-06-19 MED ORDER — MORPHINE SULFATE (PF) 2 MG/ML IV SOLN: 0.5000 mg | INTRAVENOUS | Status: DC | PRN

## 2022-06-19 MED ORDER — FENTANYL CITRATE PF 50 MCG/ML IJ SOSY
PREFILLED_SYRINGE | INTRAMUSCULAR | Status: AC
  Filled 2022-06-19: qty 1

## 2022-06-19 SURGICAL SUPPLY — 52 items
APL PRP STRL LF DISP 70% ISPRP (MISCELLANEOUS) ×1
BIT DRILL TWIST 3.5MM (BIT) ×2 IMPLANT
BLADE SURG SZ10 CARB STEEL (BLADE) ×4 IMPLANT
BNDG GAUZE ELAST 4 BULKY (GAUZE/BANDAGES/DRESSINGS) ×2 IMPLANT
CHLORAPREP W/TINT 26 (MISCELLANEOUS) ×2 IMPLANT
CLOTH BEACON ORANGE TIMEOUT ST (SAFETY) ×2 IMPLANT
COVER LIGHT HANDLE STERIS (MISCELLANEOUS) ×4 IMPLANT
COVER PERINEAL POST (MISCELLANEOUS) ×2 IMPLANT
DRAPE STERI IOBAN 125X83 (DRAPES) ×2 IMPLANT
DRILL TWIST 3.5MM (BIT) ×1
DRSG MEPILEX BORDER 4X12 (GAUZE/BANDAGES/DRESSINGS) ×2 IMPLANT
DRSG MEPILEX POST OP 4X12 (GAUZE/BANDAGES/DRESSINGS) IMPLANT
DRSG MEPILEX SACRM 8.7X9.8 (GAUZE/BANDAGES/DRESSINGS) ×2 IMPLANT
ELECT REM PT RETURN 9FT ADLT (ELECTROSURGICAL) ×1
ELECTRODE REM PT RTRN 9FT ADLT (ELECTROSURGICAL) ×2 IMPLANT
GLOVE BIO SURGEON STRL SZ7 (GLOVE) IMPLANT
GLOVE BIOGEL PI IND STRL 7.0 (GLOVE) ×4 IMPLANT
GLOVE BIOGEL PI IND STRL 8.5 (GLOVE) IMPLANT
GLOVE SKINSENSE STRL SZ8.0 LF (GLOVE) IMPLANT
GOWN STRL REUS W/TWL LRG LVL3 (GOWN DISPOSABLE) ×2 IMPLANT
GOWN STRL REUS W/TWL XL LVL3 (GOWN DISPOSABLE) ×2 IMPLANT
GUIDE PIN 3.2X230MM (PIN) ×1
INST SET MAJOR BONE (KITS) ×2 IMPLANT
KIT BLADEGUARD II DBL (SET/KITS/TRAYS/PACK) ×2 IMPLANT
KIT TURNOVER CYSTO (KITS) ×2 IMPLANT
MANIFOLD NEPTUNE II (INSTRUMENTS) ×2 IMPLANT
MARKER SKIN DUAL TIP RULER LAB (MISCELLANEOUS) ×2 IMPLANT
NDL HYPO 21X1.5 SAFETY (NEEDLE) ×2 IMPLANT
NDL SPNL 18GX3.5 QUINCKE PK (NEEDLE) ×2 IMPLANT
NEEDLE HYPO 21X1.5 SAFETY (NEEDLE) ×1 IMPLANT
NEEDLE SPNL 18GX3.5 QUINCKE PK (NEEDLE) ×1 IMPLANT
NS IRRIG 1000ML POUR BTL (IV SOLUTION) ×2 IMPLANT
PACK BASIC III (CUSTOM PROCEDURE TRAY) ×1
PACK SRG BSC III STRL LF ECLPS (CUSTOM PROCEDURE TRAY) ×2 IMPLANT
PAD ARMBOARD 7.5X6 YLW CONV (MISCELLANEOUS) ×2 IMPLANT
PENCIL SMOKE EVACUATOR COATED (MISCELLANEOUS) ×2 IMPLANT
PIN GUIDE 3.2X230MM (PIN) ×2 IMPLANT
PLATE BONE CLASSIC 135 3H (Plate) IMPLANT
SCREW COMPRESSION 19.0M (Screw) IMPLANT
SCREW CORTICAL SFTP 4.5X38MM (Screw) IMPLANT
SCREW CORTICAL SFTP 4.5X40MM (Screw) IMPLANT
SCREW LAG 105MM (Screw) IMPLANT
SET BASIN LINEN APH (SET/KITS/TRAYS/PACK) ×2 IMPLANT
SPONGE T-LAP 18X18 ~~LOC~~+RFID (SPONGE) ×4 IMPLANT
STAPLER VISISTAT 35W (STAPLE) ×2 IMPLANT
SUT BRALON NAB BRD #1 30IN (SUTURE) ×4 IMPLANT
SUT MNCRL 0 VIOLET CTX 36 (SUTURE) ×2 IMPLANT
SUT MON AB 0 CT1 (SUTURE) IMPLANT
SUT MONOCRYL 0 CTX 36 (SUTURE) ×1
SYR 30ML LL (SYRINGE) ×2 IMPLANT
SYR BULB IRRIG 60ML STRL (SYRINGE) ×2 IMPLANT
YANKAUER SUCT BULB TIP 10FT TU (MISCELLANEOUS) ×2 IMPLANT

## 2022-06-19 NOTE — Plan of Care (Signed)
Pt alert and oriented to self. Easily reoriented but forgetful. Per report patient is on 2L/[place] acute. Pt repeatedly pulls off oxygen. Sats in 70-80's after pulling off oxygen. Oxygen reapplied. Sats low 80's on 2L. Increased to 4.5L and sat 93%. Respiratory came by and increased to HFNC 6L. Continuous pox placed this am sats 100% on 6 L. Decrased to 5.5 sating 99%. Decrased to 5L and sating 98%. Pt sating 95% at 4.5 L. Will attempt to wean more. Ativan given x 2 due to ciwa. Dilaudid given x 2. Pt not complaint with turning mobility.  Problem: Clinical Measurements: Goal: Respiratory complications will improve Outcome: Not Progressing   Problem: Pain Managment: Goal: General experience of comfort will improve Outcome: Not Progressing   Problem: Education: Goal: Knowledge of General Education information will improve Description: Including pain rating scale, medication(s)/side effects and non-pharmacologic comfort measures Outcome: Progressing   Problem: Health Behavior/Discharge Planning: Goal: Ability to manage health-related needs will improve Outcome: Progressing   Problem: Clinical Measurements: Goal: Ability to maintain clinical measurements within normal limits will improve Outcome: Progressing Goal: Will remain free from infection Outcome: Progressing Goal: Diagnostic test results will improve Outcome: Progressing Goal: Cardiovascular complication will be avoided Outcome: Progressing   Problem: Activity: Goal: Risk for activity intolerance will decrease Outcome: Progressing   Problem: Nutrition: Goal: Adequate nutrition will be maintained Outcome: Progressing   Problem: Coping: Goal: Level of anxiety will decrease Outcome: Progressing   Problem: Elimination: Goal: Will not experience complications related to bowel motility Outcome: Progressing Goal: Will not experience complications related to urinary retention Outcome: Progressing   Problem: Safety: Goal:  Ability to remain free from injury will improve Outcome: Progressing   Problem: Skin Integrity: Goal: Risk for impaired skin integrity will decrease Outcome: Progressing

## 2022-06-19 NOTE — Transfer of Care (Signed)
Immediate Anesthesia Transfer of Care Note  Patient: Jacob Frank  Procedure(s) Performed: COMPRESSION HIP (Left: Hip)  Patient Location: PACU  Anesthesia Type:General  Level of Consciousness: drowsy  Airway & Oxygen Therapy: Patient Spontanous Breathing and Patient connected to face mask oxygen  Post-op Assessment: Report given to RN and Post -op Vital signs reviewed and stable  Post vital signs: Reviewed and stable  Last Vitals:  Vitals Value Taken Time  BP 132/96 06/19/22 1438  Temp 97.7   Pulse 81 06/19/22 1440  Resp 15 06/19/22 1440  SpO2 92 % 06/19/22 1440  Vitals shown include unvalidated device data.  Last Pain:  Vitals:   06/19/22 1217  TempSrc: Oral  PainSc: 0-No pain      Patients Stated Pain Goal: 2 (42/35/36 1443)  Complications: No notable events documented.

## 2022-06-19 NOTE — Progress Notes (Signed)
Progress Note   Patient: Jacob Frank PFX:[id number] DOB: November 26, 1950 DOA: 06/17/2022     2 DOS: the patient was seen and examined on 06/19/2022   Brief hospital course: Jacob Frank is a 71 y.o. male with medical history significant of alcohol abuse, paroxysmal atrial fibrillation, orthostatic hypotension, gastroesophageal reflux disease, COPD, chronic diastolic heart failure, tobacco abuse, vitamin D 12 deficiency and vitamin D deficiency; presented to the hospital after experiencing syncope event at home landing on his left side and having difficulties bearing weight and experiencing uncontrolled pain. Patient expressed multiple episodes of passing out especially when changing positions rapidly and using alcohol; patient reports drinking some beers with his roommate the night prior to falling.  There was no chest pain, palpitations, headaches, focal weakness, dizziness or lightheadedness; nausea, vomiting, shortness of breath or any other complaints.   In the ED work-up demonstrated left intertrochanteric fracture; patient found to be in A-fib with RVR with an alcohol level of 23.  He was also dehydrated and hyponatremic.  Fluid resuscitation, metoprolol IV as rate control agent were provided as long with analgesic therapy for left hip pain.  Orthopedic service consulted and TRH contacted to place patient in the hospital for further evaluation and management.  Assessment and Plan: * Intertrochanteric fracture of left hip ([place]) -Continue analgesic therapy and supportive care -Appreciate assistance and recommendation by orthopedic service; planning for ORIF later today (06/19/2022). -After surgical intervention physical therapy/Occupational Therapy evaluation will be requested to determine safe discharge plan for patient.. -Patient expressed that he might require nursing home placement for rehabilitation and he is open to that.  Vitamin D deficiency -vitamin D level 26.17 -Continue weekly oral  supplementation.  Atrial fibrillation with RVR ([place]) - Present at time of admission -After hydration and resumption of home beta-blocker and adjusted dose patient's RVR component has resolved. -Continue telemetry monitoring. -Continue to follow electrolytes and replete as needed (goal is for potassium above 4 and magnesium above 2).  Hyponatremia -Chronic in the setting of alcohol abuse -At time of admission sodium level was lower than his baseline; after fluid resuscitation sodium level is 129. -Continue to follow electrolytes trend.  -Alcohol cessation and adequate nutrition discussed with patient.  Vitamin B12 deficiency -Continue outpatient monthly injection for B12 repletion.  Tobacco dependence -Cessation counseling provided -Continue nicotine patch.  HTN (hypertension), benign -Soft on presentation; patient with underlying history of orthostatic hypotension -Continue the use of midodrine -Continue to maintain adequate hydration -Continue to follow vital signs.   COPD (chronic obstructive pulmonary disease) (HCC) -Currently stable and not demonstrating exacerbation -Continue as needed bronchodilators -Good saturation on room air appreciated on today's exam. -Patient did not use oxygen at baseline. -Patient educated regarding smoking cessation.   Subjective:  Afebrile, no chest pain, no nausea or vomiting.  Not demonstrating acute withdrawal symptoms.  Reporting pain in his left hip.  Physical Exam: Vitals:   06/19/22 0430 06/19/22 0552 06/19/22 0900 06/19/22 1217  BP:  (!) 149/112  (!) 164/107  Pulse:  90 84 92  Resp:  '15 18 17  '$ Temp:  98.9 F (37.2 C)  98.8 F (37.1 C)  TempSrc:    Oral  SpO2: 95% 97% 94% 90%  Weight:    78.2 kg  Height:    '5\' 5"'$  (1.651 m)   General exam: Alert, awake, oriented x 3; reporting pain in his left hip; no chest pain, no nausea, no vomiting.  No withdrawal symptoms. Respiratory system: Good air movement bilaterally; no using  accessory muscle.  Good saturation on room air appreciated on today's exam. Cardiovascular system: Rate controlled, no rubs, no gallops, no JVD. Gastrointestinal system: Abdomen is nondistended, soft and nontender. No organomegaly or masses felt. Normal bowel sounds heard. Central nervous system: Alert and oriented. No focal neurological deficits. Extremities: No cyanosis or clubbing Skin: No petechiae. Psychiatry: Judgement and insight appear normal. Mood & affect appropriate.   Data Reviewed: Magnesium 1.9 CBC: WBCs 5.8, hemoglobin 11.3 and platelet count 628 K Basic metabolic: Sodium 366, potassium 3.6, chloride 101, bicarb 23, BUN 13, creatinine 0.74 and GFR more than 60.  Family Communication: None at bedside.  Disposition: Status is: Inpatient Remains inpatient appropriate because: Pending treatment for left hip fracture.    Planned Discharge Destination: Determined; will most likely will require skilled nursing facility for rehabilitation and conditioning.   Author: Barton Dubois, MD 06/19/2022 12:54 PM  For on call review [email or web address].

## 2022-06-19 NOTE — Progress Notes (Signed)
Patient ID: Jacob Frank, male   DOB: [birth year], 71 y.o.   MRN: [id number] 71 year old male with history of alcoholism reportedly confused.  Patient needs left hip surgery.  The surgery needs to be performed and consent needs to be obtained administratively if necessary.  Delaying the surgery any further is associated with poor outcomes and increased mortality.  On June 18, 2022 I had a long conversation with the patient and he understood the risks and benefits of the surgery at the time and agreed to surgery.

## 2022-06-19 NOTE — Progress Notes (Signed)
Awake. Restless. Refuses to leave nasal cannula or simple mask in place. Resp adequate/nonlabored. O2 sat 77-90% on room air. Mild expiratory wheezes present. Dr Briant Cedar notified. Order given for duo neb.

## 2022-06-19 NOTE — Interval H&P Note (Signed)
History and Physical Interval Note:  06/19/2022 12:37 PM  Jacob Frank  has presented today for surgery, with the diagnosis of left hip intertrochanteric fracture.  The various methods of treatment have been discussed with the patient and family. After consideration of risks, benefits and other options for treatment, the patient has consented to  Procedure(s) with comments: COMPRESSION HIP (Left) - 2 hole or 3 hole plate as a surgical intervention.  The patient's history has been reviewed, patient examined, no change in status, stable for surgery.  I have reviewed the patient's chart and labs.  Questions were answered to the patient's satisfaction.     Arther Abbott

## 2022-06-19 NOTE — H&P (View-Only) (Signed)
Patient ID: Jacob Frank, male   DOB: [birth year], 71 y.o.   MRN: [id number] 71 year old male with history of alcoholism reportedly confused.  Patient needs left hip surgery.  The surgery needs to be performed and consent needs to be obtained administratively if necessary.  Delaying the surgery any further is associated with poor outcomes and increased mortality.  On June 18, 2022 I had a long conversation with the patient and he understood the risks and benefits of the surgery at the time and agreed to surgery.

## 2022-06-19 NOTE — Anesthesia Procedure Notes (Signed)
Procedure Name: LMA Insertion Date/Time: 06/19/2022 12:46 PM  Performed by: Karna Dupes, CRNAPre-anesthesia Checklist: Emergency Drugs available, Patient identified, Suction available and Patient being monitored Patient Re-evaluated:Patient Re-evaluated prior to induction Oxygen Delivery Method: Circle system utilized Preoxygenation: Pre-oxygenation with 100% oxygen Induction Type: IV induction LMA: LMA inserted LMA Size: 4.0 Number of attempts: 1 Placement Confirmation: positive ETCO2 and breath sounds checked- equal and bilateral Tube secured with: Tape Dental Injury: Teeth and Oropharynx as per pre-operative assessment

## 2022-06-19 NOTE — Anesthesia Preprocedure Evaluation (Signed)
Anesthesia Evaluation  Patient identified by MRN, date of birth, ID band Patient awake    Reviewed: Allergy & Precautions, H&P , NPO status , Patient's Chart, lab work & pertinent test results, reviewed documented beta blocker date and time   Airway Mallampati: II  TM Distance: >3 FB Neck ROM: full    Dental no notable dental hx.    Pulmonary COPD, Current Smoker,    Pulmonary exam normal breath sounds clear to auscultation       Cardiovascular Exercise Tolerance: Good hypertension, negative cardio ROS   Rhythm:regular Rate:Normal     Neuro/Psych PSYCHIATRIC DISORDERS Anxiety Depression CVA    GI/Hepatic hiatal hernia, GERD  ,(+)     substance abuse  alcohol use,   Endo/Other  negative endocrine ROS  Renal/GU Renal disease  negative genitourinary   Musculoskeletal   Abdominal   Peds  Hematology negative hematology ROS (+)   Anesthesia Other Findings 1. Left ventricular ejection fraction, by estimation, is 55 to 60%. The  left ventricle has normal function. The left ventricle has no regional  wall motion abnormalities. Left ventricular diastolic parameters are  consistent with Grade I diastolic  dysfunction (impaired relaxation). The average left ventricular global  longitudinal strain is -14.3 %. The global longitudinal strain is  abnormal.  2. Right ventricular systolic function is normal. The right ventricular  size is normal. There is mildly elevated pulmonary artery systolic  pressure. The estimated right ventricular systolic pressure is 16.1 mmHg.  3. The mitral valve is grossly normal. No evidence of mitral valve  regurgitation.  4. The aortic valve is tricuspid. There is moderate calcification of the  aortic valve. Aortic valve regurgitation is trivial. Aortic valve  sclerosis/calcification is present, without any evidence of aortic  stenosis.  5. The inferior vena cava is normal in size with  greater than 50%  respiratory variability, suggesting right atrial pressure of 3 mmHg.  6. Cannot exclude a small PFO. There is redundancy of the atrial septum.  Consider bubble study.   Reproductive/Obstetrics negative OB ROS                             Anesthesia Physical Anesthesia Plan  ASA: 3 and emergent  Anesthesia Plan: General and General LMA   Post-op Pain Management:    Induction:   PONV Risk Score and Plan: Ondansetron  Airway Management Planned:   Additional Equipment:   Intra-op Plan:   Post-operative Plan:   Informed Consent: I have reviewed the patients History and Physical, chart, labs and discussed the procedure including the risks, benefits and alternatives for the proposed anesthesia with the patient or authorized representative who has indicated his/her understanding and acceptance.     Dental Advisory Given  Plan Discussed with: CRNA  Anesthesia Plan Comments:         Anesthesia Quick Evaluation

## 2022-06-19 NOTE — Progress Notes (Signed)
#  14 foley catheter inserted. Clear yellow urine obtained. Foley balloon inflated with 10 ml of sterile water. Foley secured to right thigh. Complete bath given. Clean dry linens applied. Tolerated well.

## 2022-06-19 NOTE — Brief Op Note (Signed)
06/19/2022  2:29 PM  PATIENT:  Jacob Frank  71 y.o. male  PRE-OPERATIVE DIAGNOSIS:  left hip intertrochanteric fracture  POST-OPERATIVE DIAGNOSIS:  left hip intertrochanteric fracture  PROCEDURE:  Procedure(s) with comments: COMPRESSION HIP (Left) - 2 hole or 3 hole plate  [employer] dynamic hip screw 135 degree plate with 3 holes, 254 mm lag screw, three 4.5 millimeter screws, compression screw  Findings 2 part stable intertrochanteric fracture left hip   SURGEON:  Surgeon(s) and Role:    Carole Civil, MD - Primary  PHYSICIAN ASSISTANT:   ASSISTANTS: none   ANESTHESIA:   general  EBL:  100 mL   BLOOD ADMINISTERED:none  DRAINS: none   LOCAL MEDICATIONS USED:  MARCAINE     SPECIMEN:  No Specimen  DISPOSITION OF SPECIMEN:  N/A  COUNTS:  YES  TOURNIQUET:  * No tourniquets in log *  DICTATION: .Dragon Dictation  PLAN OF CARE: Admit to inpatient   PATIENT DISPOSITION:  PACU - hemodynamically stable.   Delay start of Pharmacological VTE agent (>24hrs) due to surgical blood loss or risk of bleeding: not applicable

## 2022-06-19 NOTE — Progress Notes (Signed)
Incontinent of large amt of yellow urine. Dr Aline Brochure notified and ordered foley catheter.

## 2022-06-19 NOTE — Progress Notes (Signed)
   06/19/22 0552  Assess: MEWS Score  Temp 98.9 F (37.2 C)  BP (!) 149/112  MAP (mmHg) 123  Pulse Rate 90  Resp 15  SpO2 97 %  O2 Device Nasal Cannula  O2 Flow Rate (L/min) 4.5 L/min  Assess: MEWS Score  MEWS Temp 0   Pt medicated with Dilaudid and Metoprolol.

## 2022-06-20 DIAGNOSIS — J449 Chronic obstructive pulmonary disease, unspecified: Secondary | ICD-10-CM | POA: Diagnosis not present

## 2022-06-20 DIAGNOSIS — F17200 Nicotine dependence, unspecified, uncomplicated: Secondary | ICD-10-CM | POA: Diagnosis not present

## 2022-06-20 DIAGNOSIS — E871 Hypo-osmolality and hyponatremia: Secondary | ICD-10-CM | POA: Diagnosis not present

## 2022-06-20 DIAGNOSIS — S72145A Nondisplaced intertrochanteric fracture of left femur, initial encounter for closed fracture: Secondary | ICD-10-CM | POA: Diagnosis not present

## 2022-06-20 LAB — CBC
HCT: 31.5 % — ABNORMAL LOW (ref 39.0–52.0)
Hemoglobin: 10.4 g/dL — ABNORMAL LOW (ref 13.0–17.0)
MCH: 33.1 pg (ref 26.0–34.0)
MCHC: 33 g/dL (ref 30.0–36.0)
MCV: 100.3 fL — ABNORMAL HIGH (ref 80.0–100.0)
Platelets: 164 10*3/uL (ref 150–400)
RBC: 3.14 MIL/uL — ABNORMAL LOW (ref 4.22–5.81)
RDW: 13.2 % (ref 11.5–15.5)
WBC: 10.8 10*3/uL — ABNORMAL HIGH (ref 4.0–10.5)
nRBC: 0 % (ref 0.0–0.2)

## 2022-06-20 LAB — BASIC METABOLIC PANEL
Anion gap: 8 (ref 5–15)
BUN: 8 mg/dL (ref 8–23)
CO2: 25 mmol/L (ref 22–32)
Calcium: 7.7 mg/dL — ABNORMAL LOW (ref 8.9–10.3)
Chloride: 100 mmol/L (ref 98–111)
Creatinine, Ser: 0.7 mg/dL (ref 0.61–1.24)
GFR, Estimated: >60 mL/min (ref >60)
Glucose, Bld: 106 mg/dL — ABNORMAL HIGH (ref 70–99)
Potassium: 3.7 mmol/L (ref 3.5–5.1)
Sodium: 133 mmol/L — ABNORMAL LOW (ref 135–145)

## 2022-06-20 LAB — MAGNESIUM: Magnesium: 1.5 mg/dL — ABNORMAL LOW (ref 1.7–2.4)

## 2022-06-20 MED ORDER — MAGNESIUM SULFATE 4 GM/100ML IV SOLN
4.0000 g | Freq: Once | INTRAVENOUS | Status: AC
  Administered 2022-06-20: 4 g via INTRAVENOUS
  Filled 2022-06-20: qty 100

## 2022-06-20 MED ORDER — ENOXAPARIN SODIUM 40 MG/0.4ML IJ SOSY
40.0000 mg | PREFILLED_SYRINGE | INTRAMUSCULAR | Status: DC
  Administered 2022-06-20 – 2022-06-24 (×5): 40 mg via SUBCUTANEOUS
  Filled 2022-06-20 (×5): qty 0.4

## 2022-06-20 MED ORDER — LORAZEPAM 2 MG/ML IJ SOLN
0.5000 mg | Freq: Four times a day (QID) | INTRAMUSCULAR | Status: DC | PRN
  Administered 2022-06-21 – 2022-06-23 (×3): 0.5 mg via INTRAVENOUS
  Filled 2022-06-20 (×3): qty 1

## 2022-06-20 NOTE — Progress Notes (Signed)
SLP Cancellation Note  Patient Details Name: Jacob Frank MRN: [id number] DOB: [birth year]   Cancelled treatment:       Reason Eval/Treat Not Completed: Fatigue/lethargy limiting ability to participate;Patient's level of consciousness. Pt was recently given dilaudid and is not appropriate or responsive despite max verbal cues to rouse. ST will continue efforts and will complete BSE when Pt is alert. Thank you,  Tanelle Lanzo H. Roddie Mc, CCC-SLP Speech Language Pathologist    Wende Bushy 06/20/2022, 1:23 PM

## 2022-06-20 NOTE — Plan of Care (Signed)
  Problem: Acute Rehab PT Goals(only PT should resolve) Goal: Pt Will Go Supine/Side To Sit Outcome: Progressing Flowsheets (Taken 06/20/2022 1448) Pt will go Supine/Side to Sit: with minimal assist Goal: Patient Will Transfer Sit To/From Stand Outcome: Progressing Flowsheets (Taken 06/20/2022 1448) Patient will transfer sit to/from stand: with minimal assist Goal: Pt Will Transfer Bed To Chair/Chair To Bed Outcome: Progressing Flowsheets (Taken 06/20/2022 1448) Pt will Transfer Bed to Chair/Chair to Bed: with min assist Goal: Pt Will Ambulate Outcome: Progressing Flowsheets (Taken 06/20/2022 1448) Pt will Ambulate:  25 feet  with minimal assist  with moderate assist  with rolling walker   2:48 PM, 06/20/22 Lonell Grandchild, MPT Physical Therapist with [facility] [phone] office [phone] mobile phone

## 2022-06-20 NOTE — Progress Notes (Addendum)
Patient drowsy and can say his name but thought he was in [place], [place].  Did know that he fell and broke his hip.  Dressing to hip is dry and intact.  C/O pain.  Assisted to chair with PT but  unable to stand well or pivot.  Sitting up in chair now and drinking from cup.  Mitts off.  Gave norco earllier and gave dilaudid after up in chair. Friend, roommate called and said that patient drinks 5-7 beers a day.Patient does not drive and friend transports him and gets his groceries.  Friend said that he can be listed as a contact and he was added to patient profile with phone number

## 2022-06-20 NOTE — Op Note (Signed)
Date of surgery 06/19/2022  Date of dictation 06/20/2022  Patient Jacob Frank. Bahar 71 year old male  Preop diagnosis left intertrochanteric hip fracture  Postop diagnosis same  Procedure open treatment internal fixation left hip  Implants [employer] dynamic hip screw.  135 degree 3 hole plate.  105 mm lag screw.  3, 4.5 mm shaft screws with 1 compression screw  Findings 2 part stable intertrochanter fracture left hip  Surgeon Aline Brochure  No assistance  Anesthesia General  100 cc estimated blood loss  No blood administered  No drains  Local medication Marcaine with epinephrine  No specimens  Sponge and needle count correct  Dragon Dictation  Patient will continue admission  Patient went to PACU in hemodynamically stable condition  No delay in pharmacologic VTE prophylaxis necessary  Details of procedure  After reevaluation of the patient in the preop area I cleared him for surgery.  I reviewed his imaging and determined that the preop plan was appropriate.  I checked the implants and they were available.  His surgical site was confirmed and marked as left hip and he was taken to the operating for general anesthesia  He was then placed on the fracture table with a padded perineal post his right leg was abducted his left leg was padded and placed in traction  The C-arm was brought in and the limb was placed in gentle traction with internal rotation which reduce the fracture.  It was a stable fracture pattern with no posteromedial comminution.  The hip and left lower extremity were prepped and draped in the usual sterile technique  This was followed by completion of the timeout.  The incision was started at the tip of the trochanter and extended distally along the line of the femoral shaft.  The subcutaneous tissue was divided down to the fascia which was split in line with the skin incision.  The vastus lateralis was split as well coagulating perforating vessels with  electrocautery  The periosteum was dissected from the proximal femur and a Bennett retractor was placed exposing it.  We used a fixed angle 135 degree guide to place a pin into the center of the femoral head on the AP and lateral x-ray.  After several adjustments we obtained center center position.  We measured the guidepin to be at 110 mm.  I placed the reamer over the guidepin and then placed the lag screw.  Using a rondure or I dilated the opening on the lateral femur to assist in placing the plate this went in well with a candle stick and a mallet.  I used a clamp to clamp the plate to the bone and then using a 3.2 drill bit placed 3 shaft screws.  Final imaging confirmed position of the hardware to be in excellent position  The wound was irrigated checked for bleeding.  After bleeding was successfully controlled with electrocautery we closed the vastus fascia with 0 Monocryl suture followed by fascial closure with #1 Braylon in running fashion we then closed the subcu tissue with 0 Monocryl again and then used skin staples to close the skin.  We injected the subcu breast this area with 30 cc of Marcaine with epinephrine   In terms of a postop plan The weightbearing status is as tolerated DVT prevention for 35 days aspirin is fine Staples out at 2 weeks Office appointment at 4 weeks for x-ray

## 2022-06-20 NOTE — Progress Notes (Signed)
Bladder scan showed 122 and has not voided since catheter removed this morning.  Spoke with Dr. Joesph Fillers about this and patient being lethargic.  Has received norco and dilaudid once each today but no ativan.  Dr. Joesph Fillers thinks patient is dehydrated and did not order in and out.  Also, wants night shift to hold back on ativan if remains lethargic.  Patient just now opened eyes and when asked how he was feeling said "much better".  Fed him two cartons of ice cream and he swallowed with no difficulty even though he had his eyes closed most of the time.  Drank small amount of ensure.

## 2022-06-20 NOTE — TOC Initial Note (Signed)
Transition of Care [facility]) - Initial/Assessment Note    Patient Details  Name: Jacob Frank MRN: [id number] Date of Birth: 12-14-1950  Transition of [facility] CM/SW Contact:    Ihor Gully, LCSW Phone Number: 06/20/2022, 2:28 PM  Clinical Narrative:                 [place] admitted for Intertrochanteric fracture of left hip. PT recommends SNF. Review of chart indicates that patient was provided [facility] and a cane in April and a RW in 6/21. Patient is medicated currently. TOC will discuss patient when he is clear  Expected Discharge Plan: Skilled Nursing Facility Barriers to Discharge: Continued Medical Work up   Patient Goals and CMS Choice        Expected Discharge Plan and Services Expected Discharge Plan: [place]                                              Prior Living Arrangements/Services     Patient language and need for interpreter reviewed:: Yes        Need for Family Participation in Patient Care: Yes (Comment) Care giver support system in place?: Yes (comment) Current home services: DME Criminal Activity/Legal Involvement Pertinent to Current Situation/Hospitalization: No - Comment as needed  Activities of Daily Living Home Assistive Devices/Equipment: Walker (specify type) ADL Screening (condition at time of admission) Patient's cognitive ability adequate to safely complete daily activities?: Yes Is the patient deaf or have difficulty hearing?: No Does the patient have difficulty seeing, even when wearing glasses/contacts?: No Does the patient have difficulty concentrating, remembering, or making decisions?: Yes Patient able to express need for assistance with ADLs?: Yes Does the patient have difficulty dressing or bathing?: No Independently performs ADLs?: Yes (appropriate for developmental age) Does the patient have difficulty walking or climbing stairs?: No Weakness of Legs: Both Weakness of Arms/Hands: None  Permission  Sought/Granted                  Emotional Assessment       Orientation: : Oriented to Self, Oriented to Place, Oriented to Situation Alcohol / Substance Use: Not Applicable Psych Involvement: No (comment)  Admission diagnosis:  Syncope and collapse [R55] Dehydration [E86.0] Hyponatremia [E87.1] Atrial fibrillation with rapid ventricular response ([place]) [I48.91] Intertrochanteric fracture of left hip (HCC) [S72.142A] Closed nondisplaced intertrochanteric fracture of left femur, initial encounter ([place]) [S72.145A] Patient Active Problem List   Diagnosis Date Noted   Intertrochanteric fracture of left hip ([place]) 06/17/2022   Malnutrition of moderate degree 01/25/2022   Cerebral thrombosis with cerebral infarction 01/21/2022   Generalized weakness 01/20/2022   Weakness 01/19/2022   Alcohol use disorder, severe, dependence ([place])    Alcohol withdrawal syndrome with complication, with unspecified complication ([place])    BPH with urinary obstruction 11/24/2020   Unstable gait 06/24/2020   Imbalance 06/24/2020   Benign prostatic hyperplasia with urinary obstruction 04/14/2020   Vitamin D deficiency 03/29/2020   Urinary retention 03/29/2020   Refeeding syndrome 03/27/2020   Hypomagnesemia 03/27/2020   Hypophosphatemia 03/27/2020   Acute renal failure (ARF) ([place]) 03/25/2020   Rhabdomyolysis 03/25/2020   Leukocytosis 03/25/2020   Chronic atrial fibrillation ([place]) 03/25/2020   Atrial fibrillation with RVR ([place])    Syncope and collapse 04/24/2019   Alcohol abuse 04/24/2019   Anxiety and depression    Hyponatremia 12/12/2016  Alcohol abuse with intoxication ([place]) 12/12/2016   Syncope 12/12/2016   Elevated PSA 12/02/2013   Health maintenance examination 12/01/2013   Olecranon bursitis of right elbow 04/13/2013   GAD (generalized anxiety disorder) 03/04/2013   Prostate cancer screening 05/12/2012   Gross hematuria 04/23/2012   Prostatitis 04/23/2012   COPD (chronic obstructive  pulmonary disease) ([place]) 02/06/2012   HTN (hypertension), benign 02/06/2012   Tobacco dependence 02/06/2012   COPD exacerbation ([place]) 02/06/2012   Vitamin B12 deficiency 02/06/2012   PCP:  [name], MD Pharmacy:   [place], [place] [address] [place] [address] Phone: [phone] Fax: [place], [place] [address] Phone: [phone] Fax: [phone]  [place], [place] [address] Phone: [phone] Fax: [phone]     Social Determinants of Health (SDOH) Interventions    Readmission Risk Interventions    12/28/2020   12:55 PM  Readmission Risk Prevention Plan  Transportation Screening Complete  Home Care Screening Complete  Medication Review (RN CM) Complete

## 2022-06-20 NOTE — Progress Notes (Signed)
Progress Note   Patient: VIRAJ LIBY NFA:[id number] DOB: 11-25-1950 DOA: 06/17/2022     3 DOS: the patient was seen and examined on 06/20/2022   Brief hospital course: JAMYSON JIRAK is a 71 y.o. male with medical history significant of alcohol abuse, paroxysmal atrial fibrillation, orthostatic hypotension, gastroesophageal reflux disease, COPD, chronic diastolic heart failure, tobacco abuse, vitamin D 12 deficiency and vitamin D deficiency; presented to the hospital after experiencing syncope event at home landing on his left side and having difficulties bearing weight and experiencing uncontrolled pain. Patient expressed multiple episodes of passing out especially when changing positions rapidly and using alcohol; patient reports drinking some beers with his roommate the night prior to falling.  There was no chest pain, palpitations, headaches, focal weakness, dizziness or lightheadedness; nausea, vomiting, shortness of breath or any other complaints.   In the ED work-up demonstrated left intertrochanteric fracture; patient found to be in A-fib with RVR with an alcohol level of 23.  He was also dehydrated and hyponatremic.  Fluid resuscitation, metoprolol IV as rate control agent were provided as long with analgesic therapy for left hip pain.  Orthopedic service consulted and TRH contacted to place patient in the hospital for further evaluation and management.  Assessment and Plan: 1) Intertrochanteric fracture of left hip ([place]) -Continue analgesic therapy and supportive care -Appreciate assistance and recommendation by orthopedic service; planning for ORIF later today (06/19/2022). -After surgical intervention physical therapy/Occupational Therapy evaluation will be requested to determine safe discharge plan for patient.. -Patient expressed that he might require nursing home placement for rehabilitation and he is open to that.  Vitamin D deficiency -vitamin D level 26.17 -Continue weekly oral  supplementation.  Atrial fibrillation with RVR ([place]) - Present at time of admission -After hydration and resumption of home beta-blocker and adjusted dose patient's RVR component has resolved. -Continue telemetry monitoring. -Continue to follow electrolytes and replete as needed (goal is for potassium above 4 and magnesium above 2).  Hyponatremia -Chronic in the setting of alcohol abuse -Maintain adequate hydration -Alcohol cessation and adequate nutrition discussed with patient.  Hypomagnesemia----in an alcoholic male, replace mag,  potassium WNL,  Vitamin B12 deficiency -Continue outpatient monthly injection for B12 repletion.  Tobacco dependence -Cessation counseling provided -Continue nicotine patch.  HTN (hypertension), benign -Soft on presentation; patient with underlying history of orthostatic hypotension -Continue the use of midodrine -  COPD (chronic obstructive pulmonary disease) (HCC) -Stable without acute exacerbation, no hypoxia -Continue bronchodilators  Subjective:  -Somewhat sleepy -RN Izora Gala at bedside -Patient is cooperative  Physical Exam: Vitals:   06/20/22 0010 06/20/22 0539 06/20/22 0815 06/20/22 1154  BP: (!) 165/110 (!) 143/106  110/79  Pulse: 89 89 85 71  Resp: '20 19 18 16  '$ Temp: (!) 97 F (36.1 C) 98.9 F (37.2 C)  (!) 97.5 F (36.4 C)  TempSrc: Axillary   Oral  SpO2:  94% 95% 90%  Weight:      Height:        Physical Exam  Gen:-Somewhat sleepy, cooperative  HEENT:- [place].AT, No sclera icterus Neck-Supple Neck,No JVD,.  Lungs-  CTAB , fair air movement bilaterally  CV- S1, S2 normal, RRR Abd-  +ve B.Sounds, Abd Soft, No tenderness,    Extremity/Skin:- No  edema,   good pedal pulses  Psych-affect is appropriate, oriented x3 Neuro-no new focal deficits, +ve tremors MSK--Lt Hip wound is clean and intact GU-Foley removed 06/20/2022   Family Communication: None at bedside.  Disposition: Status is: Inpatient Remains inpatient  appropriate because: Pending treatment for left hip fracture.    Planned Discharge Destination: Determined; will most likely will require skilled nursing facility for rehabilitation and conditioning.   Author: Roxan Hockey, MD 06/20/2022 5:20 PM  For on call review [email or web address].

## 2022-06-20 NOTE — NC FL2 (Signed)
[place] LEVEL OF CARE SCREENING TOOL     IDENTIFICATION  Patient Name: Jacob Frank Birthdate: 05/03/1951 Sex: male Admission Date (Current Location): 06/17/2022  [place] and [place] Number:  [place] [id number] [place] and Address:  [facility] [place] [address] [place]      Provider Number: [id number]  Attending Physician Name and Address:  Roxan Hockey, MD  Relative Name and Phone Number:  [name] Denman [phone]    Current Level of Care: Hospital Recommended Level of Care: [place] Prior Approval Number:    Date Approved/Denied:   PASRR Number: [id number]  Discharge Plan: SNF    Current Diagnoses: Patient Active Problem List   Diagnosis Date Noted   Intertrochanteric fracture of left hip ([place]) 06/17/2022   Malnutrition of moderate degree 01/25/2022   Cerebral thrombosis with cerebral infarction 01/21/2022   Generalized weakness 01/20/2022   Weakness 01/19/2022   Alcohol use disorder, severe, dependence (HCC)    Alcohol withdrawal syndrome with complication, with unspecified complication ([place])    BPH with urinary obstruction 11/24/2020   Unstable gait 06/24/2020   Imbalance 06/24/2020   Benign prostatic hyperplasia with urinary obstruction 04/14/2020   Vitamin D deficiency 03/29/2020   Urinary retention 03/29/2020   Refeeding syndrome 03/27/2020   Hypomagnesemia 03/27/2020   Hypophosphatemia 03/27/2020   Acute renal failure (ARF) ([place]) 03/25/2020   Rhabdomyolysis 03/25/2020   Leukocytosis 03/25/2020   Chronic atrial fibrillation ([place]) 03/25/2020   Atrial fibrillation with RVR ([place])    Syncope and collapse 04/24/2019   Alcohol abuse 04/24/2019   Anxiety and depression    Hyponatremia 12/12/2016   Alcohol abuse with intoxication ([place]) 12/12/2016   Syncope 12/12/2016   Elevated PSA 12/02/2013   Health maintenance examination 12/01/2013   Olecranon bursitis of right elbow  04/13/2013   GAD (generalized anxiety disorder) 03/04/2013   Prostate cancer screening 05/12/2012   Gross hematuria 04/23/2012   Prostatitis 04/23/2012   COPD (chronic obstructive pulmonary disease) ([place]) 02/06/2012   HTN (hypertension), benign 02/06/2012   Tobacco dependence 02/06/2012   COPD exacerbation ([place]) 02/06/2012   Vitamin B12 deficiency 02/06/2012    Orientation RESPIRATION BLADDER Height & Weight     Self, Time, Situation, Place  O2 (2L) Incontinent Weight: 172 lb 6.4 oz (78.2 kg) Height:  '5\' 5"'$  (165.1 cm)  BEHAVIORAL SYMPTOMS/MOOD NEUROLOGICAL BOWEL NUTRITION STATUS      Incontinent Diet (diet soft, chopped meat)  AMBULATORY STATUS COMMUNICATION OF NEEDS Skin   Limited Assist Verbally Surgical wounds (Left hip)                       Personal Care Assistance Level of Assistance  Bathing, Feeding, Dressing Bathing Assistance: Limited assistance Feeding assistance: Independent Dressing Assistance: Limited assistance     Functional Limitations Info  Sight, Hearing, Speech Sight Info: Adequate Hearing Info: Adequate Speech Info: Adequate    SPECIAL CARE FACTORS FREQUENCY  PT (By licensed PT)     PT Frequency: 5x/week              Contractures Contractures Info: Not present    Additional Factors Info  Psychotropic, Code Status, Allergies Code Status Info: Full Code Allergies Info: Citalopram Psychotropic Info: Seroquel, Wellbutrin         Current Medications (06/20/2022):  This is the current hospital active medication list Current Facility-Administered Medications  Medication Dose Route Frequency Provider Last Rate Last Admin   0.9 %  sodium chloride infusion  Intravenous Continuous Carole Civil, MD 100 mL/hr at 06/20/22 0559 Infusion Verify at 06/20/22 0559   acetaminophen (TYLENOL) tablet 325-650 mg  325-650 mg Oral Q6H PRN Carole Civil, MD       albuterol (PROVENTIL) (2.5 MG/3ML) 0.083% nebulizer solution 2.5 mg  2.5 mg  Nebulization Q8H PRN Carole Civil, MD       arformoterol [facility]) nebulizer solution 15 mcg  15 mcg Nebulization BID Carole Civil, MD   15 mcg at 06/20/22 9604   And   umeclidinium bromide (INCRUSE ELLIPTA) 62.5 MCG/ACT 1 puff  1 puff Inhalation Daily Carole Civil, MD   1 puff at 06/20/22 5409   aspirin EC tablet 325 mg  325 mg Oral Q breakfast Carole Civil, MD   325 mg at 06/20/22 0856   buPROPion (WELLBUTRIN XL) 24 hr tablet 300 mg  300 mg Oral QHS Carole Civil, MD   300 mg at 06/20/22 0009   docusate sodium (COLACE) capsule 100 mg  100 mg Oral BID Carole Civil, MD   100 mg at 81/19/14 7829   folic acid (FOLVITE) tablet 1 mg  1 mg Oral Daily Carole Civil, MD   1 mg at 06/20/22 5621   HYDROcodone-acetaminophen (NORCO/VICODIN) 5-325 MG per tablet 1-2 tablet  1-2 tablet Oral Q4H PRN Carole Civil, MD   1 tablet at 06/20/22 0856   HYDROmorphone (DILAUDID) injection 1 mg  1 mg Intravenous Q4H PRN Carole Civil, MD   1 mg at 06/20/22 1111   influenza vaccine adjuvanted (FLUAD) injection 0.5 mL  0.5 mL Intramuscular Tomorrow-1000 Barton Dubois, MD       LORazepam (ATIVAN) injection 0.5 mg  0.5 mg Intravenous Q6H PRN Emokpae, Courage, MD       menthol-cetylpyridinium (CEPACOL) lozenge 3 mg  1 lozenge Oral PRN Carole Civil, MD       Or   phenol (CHLORASEPTIC) mouth spray 1 spray  1 spray Mouth/Throat PRN Carole Civil, MD       methocarbamol (ROBAXIN) tablet 500 mg  500 mg Oral Q6H PRN Carole Civil, MD   500 mg at 06/17/22 2005   Or   methocarbamol (ROBAXIN) 500 mg in dextrose 5 % 50 mL IVPB  500 mg Intravenous Q6H PRN Carole Civil, MD       metoCLOPramide (REGLAN) tablet 5-10 mg  5-10 mg Oral Q8H PRN Carole Civil, MD       Or   metoCLOPramide (REGLAN) injection 5-10 mg  5-10 mg Intravenous Q8H PRN Carole Civil, MD       metoprolol succinate (TOPROL-XL) 24 hr tablet 150 mg  150 mg Oral Daily  Carole Civil, MD   150 mg at 06/20/22 0842   metoprolol tartrate (LOPRESSOR) injection 5 mg  5 mg Intravenous Q8H PRN Carole Civil, MD   5 mg at 06/19/22 0604   midodrine (PROAMATINE) tablet 10 mg  10 mg Oral TID WC Carole Civil, MD   10 mg at 06/20/22 0845   morphine (PF) 2 MG/ML injection 0.5-1 mg  0.5-1 mg Intravenous Q2H PRN Carole Civil, MD       multivitamin with minerals tablet 1 tablet  1 tablet Oral Daily Carole Civil, MD   1 tablet at 06/20/22 0846   nicotine (NICODERM CQ - dosed in mg/24 hours) patch 14 mg  14 mg Transdermal Daily Carole Civil, MD   14 mg at 06/20/22  0846   ondansetron (ZOFRAN) injection 4 mg  4 mg Intravenous Q6H PRN Carole Civil, MD   4 mg at 06/17/22 0915   ondansetron (ZOFRAN) tablet 4 mg  4 mg Oral Q6H PRN Carole Civil, MD       Or   ondansetron [facility]) injection 4 mg  4 mg Intravenous Q6H PRN Carole Civil, MD       Oral care mouth rinse  15 mL Mouth Rinse PRN Carole Civil, MD       pantoprazole (PROTONIX) EC tablet 40 mg  40 mg Oral Daily Carole Civil, MD   40 mg at 06/20/22 0841   pneumococcal 20-valent conjugate vaccine (PREVNAR 20) injection 0.5 mL  0.5 mL Intramuscular Tomorrow-1000 Carole Civil, MD       QUEtiapine (SEROQUEL) tablet 200 mg  200 mg Oral BID Carole Civil, MD   200 mg at 06/20/22 5945   thiamine (VITAMIN B1) tablet 100 mg  100 mg Oral Daily Carole Civil, MD   100 mg at 06/20/22 0844   traMADol (ULTRAM) tablet 50 mg  50 mg Oral Q6H Carole Civil, MD   50 mg at 06/20/22 0549   Vitamin D (Ergocalciferol) (DRISDOL) 1.25 MG (50000 UNIT) capsule 50,000 Units  50,000 Units Oral Q7 days Carole Civil, MD   50,000 Units at 06/18/22 1756     Discharge Medications: Please see discharge summary for a list of discharge medications.  Relevant Imaging Results:  Relevant Lab Results:   Additional Information SSN [id number] Clydene Pugh, [place]

## 2022-06-20 NOTE — Evaluation (Signed)
Physical Therapy Evaluation Patient Details Name: Jacob Frank MRN: [id number] DOB: Feb 15, 1951 Today's Date: 06/20/2022  History of Present Illness  Jacob Frank is a 71 y.o. male s/p open treatment internal fixation left hip on 06/19/22  with medical history significant of alcohol abuse, paroxysmal atrial fibrillation, orthostatic hypotension, gastroesophageal reflux disease, COPD, chronic diastolic heart failure, tobacco abuse, vitamin D 12 deficiency and vitamin D deficiency; presented to the hospital after experiencing syncope event at home landing on his left side and having difficulties bearing weight and experiencing uncontrolled pain.  Patient expressed multiple episodes of passing out especially when changing positions rapidly and using alcohol; patient reports drinking some beers with his roommate the night prior to falling.  There was no chest pain, palpitations, headaches, focal weakness, dizziness or lightheadedness; nausea, vomiting, shortness of breath or any other complaints.   Clinical Impression  Patient demonstrates slow labored movement for sitting up at bedside with difficulty moving LLE due to increased pain, very unsteady on feet and limited to a few side steps before having to sit due to poor standing and fall risk.  Patient tolerated sitting up in chair after therapy - nursing aware.  Patient will benefit from continued skilled physical therapy in hospital and recommended venue below to increase strength, balance, endurance for safe ADLs and gait.          Recommendations for follow up therapy are one component of a multi-disciplinary discharge planning process, led by the attending physician.  Recommendations may be updated based on patient status, additional functional criteria and insurance authorization.  Follow Up Recommendations Skilled nursing-short term rehab (<3 hours/day) Can patient physically be transported by private vehicle: No    Assistance Recommended at  Discharge Intermittent Supervision/Assistance  Patient can return home with the following  A lot of help with walking and/or transfers;A lot of help with bathing/dressing/bathroom;Help with stairs or ramp for entrance;Assistance with cooking/housework    Equipment Recommendations Rolling walker (2 wheels)  Recommendations for Other Services       Functional Status Assessment Patient has had a recent decline in their functional status and demonstrates the ability to make significant improvements in function in a reasonable and predictable amount of time.     Precautions / Restrictions Precautions Precautions: Fall Restrictions Weight Bearing Restrictions: Yes LLE Weight Bearing: Weight bearing as tolerated      Mobility  Bed Mobility Overal bed mobility: Needs Assistance Bed Mobility: Supine to Sit     Supine to sit: Min assist, Mod assist     General bed mobility comments: increase time, labored movement with difficulty moving LLE due to increased pain    Transfers Overall transfer level: Needs assistance Equipment used: Rolling walker (2 wheels) Transfers: Sit to/from Stand, Bed to chair/wheelchair/BSC Sit to Stand: Mod assist   Step pivot transfers: Mod assist       General transfer comment: increased time, increased pain LLE    Ambulation/Gait Ambulation/Gait assistance: Max assist, Mod assist Gait Distance (Feet): 3 Feet Assistive device: Rolling walker (2 wheels) Gait Pattern/deviations: Decreased step length - right, Decreased step length - left, Decreased stride length, Decreased stance time - left, Antalgic Gait velocity: slow     General Gait Details: limited to a few slow labored unsteady sides steps and limited mostly due to severe pain left hip when weightbearing  Stairs            Wheelchair Mobility    Modified Rankin (Stroke Patients Only)  Balance Overall balance assessment: Needs assistance Sitting-balance support: Feet  supported, Bilateral upper extremity supported Sitting balance-Leahy Scale: Fair Sitting balance - Comments: seated at EOB   Standing balance support: During functional activity, Bilateral upper extremity supported, Reliant on assistive device for balance Standing balance-Leahy Scale: Poor Standing balance comment: fair/poor using RW                             Pertinent Vitals/Pain Pain Assessment Pain Assessment: Faces Faces Pain Scale: Hurts little more Pain Location: left hip with movement Pain Descriptors / Indicators: Grimacing, Guarding, Sore Pain Intervention(s): Limited activity within patient's tolerance, Monitored during session, Premedicated before session, Repositioned    Home Living Family/patient expects to be discharged to:: Private residence Living Arrangements: Non-relatives/Friends Available Help at Discharge: Other (Comment) Type of Home: House Home Access: Stairs to enter Entrance Stairs-Rails: Right Entrance Stairs-Number of Steps: 3-4, then an entire flight to top floor Alternate Level Stairs-Number of Steps: flight Home Layout: Two level Home Equipment: None Additional Comments: most per chart, patient is poor historian    Prior Function Prior Level of Function : Independent/Modified Independent             Mobility Comments: household ambulator leaning on walls and furniture ADLs Comments: Independent     Hand Dominance   Dominant Hand: Right    Extremity/Trunk Assessment   Upper Extremity Assessment Upper Extremity Assessment: Overall WFL for tasks assessed    Lower Extremity Assessment Lower Extremity Assessment: Generalized weakness;LLE deficits/detail LLE Deficits / Details: grossly 3/5 LLE: Unable to fully assess due to pain LLE Sensation: WNL LLE Coordination: WNL    Cervical / Trunk Assessment Cervical / Trunk Assessment: Normal  Communication   Communication: No difficulties  Cognition Arousal/Alertness:  Awake/alert Behavior During Therapy: WFL for tasks assessed/performed Overall Cognitive Status: Within Functional Limits for tasks assessed                                          General Comments      Exercises     Assessment/Plan    PT Assessment Patient needs continued PT services  PT Problem List Decreased strength;Decreased activity tolerance;Decreased balance;Decreased mobility       PT Treatment Interventions DME instruction;Gait training;Stair training;Functional mobility training;Therapeutic exercise;Therapeutic activities;Patient/family education;Balance training    PT Goals (Current goals can be found in the Care Plan section)  Acute Rehab PT Goals Patient Stated Goal: return home PT Goal Formulation: With patient Time For Goal Achievement: 07/04/22 Potential to Achieve Goals: Good    Frequency Min 4X/week     Co-evaluation               AM-PAC PT "6 Clicks" Mobility  Outcome Measure Help needed turning from your back to your side while in a flat bed without using bedrails?: A Lot Help needed moving from lying on your back to sitting on the side of a flat bed without using bedrails?: A Lot Help needed moving to and from a bed to a chair (including a wheelchair)?: A Lot Help needed standing up from a chair using your arms (e.g., wheelchair or bedside chair)?: A Lot Help needed to walk in hospital room?: A Lot Help needed climbing 3-5 steps with a railing? : Total 6 Click Score: 11    End of Session Equipment Utilized During Treatment:  Oxygen Activity Tolerance: Patient tolerated treatment well;Patient limited by fatigue Patient left: in chair;with call bell/phone within reach Nurse Communication: Mobility status PT Visit Diagnosis: Unsteadiness on feet (R26.81);Other abnormalities of gait and mobility (R26.89);Muscle weakness (generalized) (M62.81)    Time: 1000-1032 PT Time Calculation (min) (ACUTE ONLY): 32 min   Charges:    PT Evaluation $PT Eval Moderate Complexity: 1 Mod PT Treatments $Therapeutic Activity: 23-37 mins        2:46 PM, 06/20/22 Lonell Grandchild, MPT Physical Therapist with [facility] [phone] office [phone] mobile phone

## 2022-06-20 NOTE — Anesthesia Postprocedure Evaluation (Signed)
Anesthesia Post Note  Patient: Jacob Frank  Procedure(s) Performed: COMPRESSION HIP (Left: Hip)  Patient location during evaluation: Phase II Anesthesia Type: General Level of consciousness: awake Pain management: pain level controlled Vital Signs Assessment: post-procedure vital signs reviewed and stable Respiratory status: spontaneous breathing and respiratory function stable Cardiovascular status: blood pressure returned to baseline and stable Postop Assessment: no headache and no apparent nausea or vomiting Anesthetic complications: no Comments: Late entry   No notable events documented.   Last Vitals:  Vitals:   06/20/22 0010 06/20/22 0539  BP: (!) 165/110 (!) 143/106  Pulse: 89 89  Resp: 20 19  Temp: (!) 36.1 C 37.2 C  SpO2:  94%    Last Pain:  Vitals:   06/20/22 0634  TempSrc:   PainSc: [name]

## 2022-06-21 ENCOUNTER — Inpatient Hospital Stay (HOSPITAL_COMMUNITY): Payer: Medicare Other

## 2022-06-21 DIAGNOSIS — J449 Chronic obstructive pulmonary disease, unspecified: Secondary | ICD-10-CM | POA: Diagnosis not present

## 2022-06-21 DIAGNOSIS — S72145A Nondisplaced intertrochanteric fracture of left femur, initial encounter for closed fracture: Secondary | ICD-10-CM | POA: Diagnosis not present

## 2022-06-21 DIAGNOSIS — I10 Essential (primary) hypertension: Secondary | ICD-10-CM | POA: Diagnosis not present

## 2022-06-21 DIAGNOSIS — E871 Hypo-osmolality and hyponatremia: Secondary | ICD-10-CM | POA: Diagnosis not present

## 2022-06-21 LAB — CBC
HCT: 31.2 % — ABNORMAL LOW (ref 39.0–52.0)
Hemoglobin: 10.4 g/dL — ABNORMAL LOW (ref 13.0–17.0)
MCH: 33.4 pg (ref 26.0–34.0)
MCHC: 33.3 g/dL (ref 30.0–36.0)
MCV: 100.3 fL — ABNORMAL HIGH (ref 80.0–100.0)
Platelets: 154 10*3/uL (ref 150–400)
RBC: 3.11 MIL/uL — ABNORMAL LOW (ref 4.22–5.81)
RDW: 13.4 % (ref 11.5–15.5)
WBC: 6.6 10*3/uL (ref 4.0–10.5)
nRBC: 0 % (ref 0.0–0.2)

## 2022-06-21 LAB — RENAL FUNCTION PANEL
Albumin: 2.8 g/dL — ABNORMAL LOW (ref 3.5–5.0)
Anion gap: 6 (ref 5–15)
BUN: 14 mg/dL (ref 8–23)
CO2: 24 mmol/L (ref 22–32)
Calcium: 7.9 mg/dL — ABNORMAL LOW (ref 8.9–10.3)
Chloride: 104 mmol/L (ref 98–111)
Creatinine, Ser: 0.78 mg/dL (ref 0.61–1.24)
GFR, Estimated: >60 mL/min (ref >60)
Glucose, Bld: 81 mg/dL (ref 70–99)
Phosphorus: 2.5 mg/dL (ref 2.5–4.6)
Potassium: 3.3 mmol/L — ABNORMAL LOW (ref 3.5–5.1)
Sodium: 134 mmol/L — ABNORMAL LOW (ref 135–145)

## 2022-06-21 MED ORDER — DIAZEPAM 2 MG PO TABS
2.0000 mg | ORAL_TABLET | Freq: Two times a day (BID) | ORAL | Status: DC
  Administered 2022-06-21: 2 mg via ORAL
  Filled 2022-06-21: qty 1

## 2022-06-21 MED ORDER — DIAZEPAM 2 MG PO TABS
2.0000 mg | ORAL_TABLET | Freq: Two times a day (BID) | ORAL | Status: AC
  Administered 2022-06-22 – 2022-06-23 (×3): 2 mg via ORAL
  Filled 2022-06-21 (×3): qty 1

## 2022-06-21 MED ORDER — POTASSIUM CHLORIDE CRYS ER 20 MEQ PO TBCR
40.0000 meq | EXTENDED_RELEASE_TABLET | ORAL | Status: AC
  Administered 2022-06-21 (×2): 40 meq via ORAL
  Filled 2022-06-21 (×2): qty 2
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
## 2022-06-21 NOTE — TOC Progression Note (Signed)
Transition of Care [facility]) - Progression Note    Patient Details  Name: Jacob Frank MRN: [id number] Date of Birth: 07/11/1951  [facility] CM/SW Contact  Ihor Gully, LCSW Phone Number: 06/21/2022, 1:28 PM  Clinical Narrative:    Patient has bed offers. However; patient is oriented to person only. His friend/roommate, [name]', phone number listed on the chart is not correct. The phone number for patient is disconnected. [employer] to ride by patient's house to see if roommate is home and will give him TOC's contact number. Roommate is only person listed on patient's chart.    Expected Discharge Plan: Skilled Nursing Facility Barriers to Discharge: Continued Medical Work up  Expected Discharge Plan and Services Expected Discharge Plan: [place]                                               Social Determinants of Health (SDOH) Interventions    Readmission Risk Interventions    12/28/2020   12:55 PM  Readmission Risk Prevention Plan  Transportation Screening Complete  Home Care Screening Complete  Medication Review (RN CM) Complete

## 2022-06-21 NOTE — Progress Notes (Signed)
Patient attempting to climb out of bed again. Disoriented. Once again removing his oxygen. Patient not aware of condition or medical issues. Bed alarms and fall mats in place.

## 2022-06-21 NOTE — Progress Notes (Signed)
Patient reaching for things rather incoherently. Found to be trying to get up out of bed. Explained we would need to help him and could assist him urinating. Expressed understanding.

## 2022-06-21 NOTE — Progress Notes (Signed)
Patient found to be 85% on RA by CNA during vitals round. Patient had removed his oxygen. Patient placed back on  2l/, sats improved after a few minutes to 90%, slowly rising. Notified Dr. Joesph Fillers. Patient drowsy, but responsive to stimulus and name call.

## 2022-06-21 NOTE — Progress Notes (Signed)
Physical Therapy Treatment Patient Details Name: Jacob Frank MRN: [id number] DOB: 1950-10-22 Today's Date: 06/21/2022   History of Present Illness Jacob Frank is a 71 y.o. male s/p open treatment internal fixation left hip on 06/19/22  with medical history significant of alcohol abuse, paroxysmal atrial fibrillation, orthostatic hypotension, gastroesophageal reflux disease, COPD, chronic diastolic heart failure, tobacco abuse, vitamin D 12 deficiency and vitamin D deficiency; presented to the hospital after experiencing syncope event at home landing on his left side and having difficulties bearing weight and experiencing uncontrolled pain.  Patient expressed multiple episodes of passing out especially when changing positions rapidly and using alcohol; patient reports drinking some beers with his roommate the night prior to falling.  There was no chest pain, palpitations, headaches, focal weakness, dizziness or lightheadedness; nausea, vomiting, shortness of breath or any other complaints.    PT Comments    Patient presents slightly lethargic and able to participate with therapy requiring repeated verbal/tactile cueing.  Patient demonstrates fair/good return for sitting up at bedside with labored movement and Min/mod assist to move LLE, tolerated standing with RW x 3 trials, but unable to take steps or transfer to chair due to c/o severe pain LLE when weightbearing.  Patient put back to bed after therapy.  Patient will benefit from continued skilled physical therapy in hospital and recommended venue below to increase strength, balance, endurance for safe ADLs and gait.     Recommendations for follow up therapy are one component of a multi-disciplinary discharge planning process, led by the attending physician.  Recommendations may be updated based on patient status, additional functional criteria and insurance authorization.  Follow Up Recommendations  Skilled nursing-short term rehab (<3  hours/day) Can patient physically be transported by private vehicle: No   Assistance Recommended at Discharge Intermittent Supervision/Assistance  Patient can return home with the following A lot of help with walking and/or transfers;A lot of help with bathing/dressing/bathroom;Help with stairs or ramp for entrance;Assistance with cooking/housework   Equipment Recommendations  Rolling walker (2 wheels)    Recommendations for Other Services       Precautions / Restrictions Precautions Precautions: Fall Restrictions Weight Bearing Restrictions: Yes LLE Weight Bearing: Weight bearing as tolerated     Mobility  Bed Mobility Overal bed mobility: Needs Assistance Bed Mobility: Supine to Sit, Sit to Supine     Supine to sit: Min assist, Mod assist Sit to supine: Min assist   General bed mobility comments: increased time, labored movement    Transfers Overall transfer level: Needs assistance Equipment used: Rolling walker (2 wheels) Transfers: Sit to/from Stand Sit to Stand: Mod assist           General transfer comment: tolerated sit to stands x 3 trials, but unable to transfer to chair due to increased left hip pain    Ambulation/Gait                   Stairs             Wheelchair Mobility    Modified Rankin (Stroke Patients Only)       Balance Overall balance assessment: Needs assistance Sitting-balance support: Feet supported, No upper extremity supported Sitting balance-Leahy Scale: Fair Sitting balance - Comments: fair/good seated at EOB   Standing balance support: Reliant on assistive device for balance, During functional activity, Bilateral upper extremity supported Standing balance-Leahy Scale: Poor Standing balance comment: fair/poor using RW  Cognition Arousal/Alertness: Awake/alert, Lethargic Behavior During Therapy: WFL for tasks assessed/performed Overall Cognitive Status: Within  Functional Limits for tasks assessed                                          Exercises General Exercises - Lower Extremity Long Arc Quad: Seated, AROM, Strengthening, Both, 10 reps Hip ABduction/ADduction: Seated, AROM, Strengthening, Both, 10 reps    General Comments        Pertinent Vitals/Pain Pain Assessment Pain Assessment: Faces Faces Pain Scale: Hurts even more Pain Location: left hip with movement Pain Descriptors / Indicators: Grimacing, Guarding, Sore Pain Intervention(s): Limited activity within patient's tolerance, Monitored during session, Repositioned    Home Living                          Prior Function            PT Goals (current goals can now be found in the care plan section) Acute Rehab PT Goals Patient Stated Goal: return home PT Goal Formulation: With patient Time For Goal Achievement: 07/04/22 Potential to Achieve Goals: Good Progress towards PT goals: Progressing toward goals    Frequency    Min 4X/week      PT Plan Current plan remains appropriate    Co-evaluation              AM-PAC PT "6 Clicks" Mobility   Outcome Measure  Help needed turning from your back to your side while in a flat bed without using bedrails?: A Lot Help needed moving from lying on your back to sitting on the side of a flat bed without using bedrails?: A Lot Help needed moving to and from a bed to a chair (including a wheelchair)?: A Lot Help needed standing up from a chair using your arms (e.g., wheelchair or bedside chair)?: A Lot Help needed to walk in hospital room?: A Lot Help needed climbing 3-5 steps with a railing? : Total 6 Click Score: 11    End of Session Equipment Utilized During Treatment: Oxygen Activity Tolerance: Patient tolerated treatment well;Patient limited by fatigue Patient left: in chair;with call bell/phone within reach Nurse Communication: Mobility status PT Visit Diagnosis: Unsteadiness on feet  (R26.81);Other abnormalities of gait and mobility (R26.89);Muscle weakness (generalized) (M62.81)     Time: 7741-2878 PT Time Calculation (min) (ACUTE ONLY): 25 min  Charges:  $Therapeutic Exercise: 8-22 mins $Therapeutic Activity: 8-22 mins                     2:46 PM, 06/21/22 Lonell Grandchild, MPT Physical Therapist with [facility] [address] [phone] office [phone] mobile phone

## 2022-06-21 NOTE — Progress Notes (Signed)
Progress Note   Patient: Jacob Frank PXT:[id number] DOB: [birth year] DOA: 06/17/2022     4 DOS: the patient was seen and examined on 06/21/2022   Brief hospital course: Jacob Frank is a 71 y.o. male with medical history significant of alcohol abuse, paroxysmal atrial fibrillation, orthostatic hypotension, gastroesophageal reflux disease, COPD, chronic diastolic heart failure, tobacco abuse, vitamin D 12 deficiency and vitamin D deficiency; presented to the hospital after experiencing syncope event at home landing on his left side and having difficulties bearing weight and experiencing uncontrolled pain. Patient expressed multiple episodes of passing out especially when changing positions rapidly and using alcohol; patient reports drinking some beers with his roommate the night prior to falling.  There was no chest pain, palpitations, headaches, focal weakness, dizziness or lightheadedness; nausea, vomiting, shortness of breath or any other complaints.   In the ED work-up demonstrated left intertrochanteric fracture; patient found to be in A-fib with RVR with an alcohol level of 23.  He was also dehydrated and hyponatremic.  Fluid resuscitation, metoprolol IV as rate control agent were provided as long with analgesic therapy for left hip pain.  Orthopedic service consulted and TRH contacted to place patient in the hospital for further evaluation and management.  Assessment and Plan: 1) Intertrochanteric fracture of left hip (HCC) -Continue analgesic therapy and supportive care -s/p ORIF 06/19/2022) PT OT eval appreciated recommends SNF rehab  2) acute metabolic encephalopathy--suspect some degree of alcohol withdrawal/DTs -Continue benzos and multivitamin- -continue delirium precautions/redirection  3) acute hypoxic respiratory failure--- multifactorial in the postop patient -Recheck chest x-ray in a.m.  Vitamin D deficiency -vitamin D level 26.17 -Continue weekly oral  supplementation.  Atrial fibrillation with RVR ([place]) - Present at time of admission -After hydration and resumption of home beta-blocker and adjusted dose patient's RVR component has resolved. -Continue telemetry monitoring. -Continue to follow electrolytes and replete as needed (goal is for potassium above 4 and magnesium above 2  Hyponatremia -Chronic in the setting of alcohol abuse -Maintain adequate hydration -Alcohol cessation and adequate nutrition discussed with patient.  Hypomagnesemia/hypokalemia----in an alcoholic male, replace  Vitamin B12 deficiency -Continue outpatient monthly injection for B12 repletion.  Tobacco dependence -Cessation counseling provided -Continue nicotine patch.  HTN (hypertension), benign -Soft on presentation; patient with underlying history of orthostatic hypotension -Continue the use of midodrine -  COPD (chronic obstructive pulmonary disease) (HCC) -Stable without acute exacerbation, no hypoxia -Continue bronchodilators  Subjective:  -RN Brandie at bedside -Intermittently confused and disoriented -Oral intake is fair  Physical Exam: Vitals:   06/21/22 0900 06/21/22 0946 06/21/22 1218 06/21/22 1221  BP:  (!) 164/97 (!) 128/111   Pulse: 82 83 79   Resp:   18   Temp:  98 F (36.7 C) 98.2 F (36.8 C)   TempSrc:  Oral    SpO2:  95% (!) 89% 90%  Weight:      Height:        Physical Exam  Gen:-Somewhat sleepy, cooperative  HEENT:- [place].AT, No sclera icterus Neck-Supple Neck,No JVD,.  Lungs-  CTAB , fair air movement bilaterally  CV- S1, S2 normal, RRR Abd-  +ve B.Sounds, Abd Soft, No tenderness,    Extremity/Skin:- No  edema,   good pedal pulses  Psych-confused and disoriented, delirium tremens  neuro-no new focal deficits, +ve tremors MSK--Lt Hip wound is clean and intact GU-Foley removed 06/20/2022  Family Communication: None at bedside.  Disposition: Anticipate discharge to SNF rehab once delirium improves and once  TOC/social workers are able  to the current legal guardian or decision maker to sign for patient to go to SNF rehab  Status is: Inpatient Remains inpatient appropriate because: Awaiting SNF rehab   Planned Discharge Destination: Determined; will most likely will require skilled nursing facility for rehabilitation and conditioning.   Author: Roxan Hockey, MD 06/21/2022 6:16 PM  For on call review [email or web address].

## 2022-06-21 NOTE — Progress Notes (Signed)
Patient agitated, pulling at IV, Trying to disconnect cords in room, maneuvering his bed in un safe positions. Refusing to allow nurse to place him back on o2. PRN meds given at this time.

## 2022-06-22 DIAGNOSIS — S72145A Nondisplaced intertrochanteric fracture of left femur, initial encounter for closed fracture: Secondary | ICD-10-CM | POA: Diagnosis not present

## 2022-06-22 DIAGNOSIS — E538 Deficiency of other specified B group vitamins: Secondary | ICD-10-CM | POA: Diagnosis not present

## 2022-06-22 DIAGNOSIS — J449 Chronic obstructive pulmonary disease, unspecified: Secondary | ICD-10-CM | POA: Diagnosis not present

## 2022-06-22 LAB — CBC
HCT: 33.1 % — ABNORMAL LOW (ref 39.0–52.0)
Hemoglobin: 10.9 g/dL — ABNORMAL LOW (ref 13.0–17.0)
MCH: 32.8 pg (ref 26.0–34.0)
MCHC: 32.9 g/dL (ref 30.0–36.0)
MCV: 99.7 fL (ref 80.0–100.0)
Platelets: 178 10*3/uL (ref 150–400)
RBC: 3.32 MIL/uL — ABNORMAL LOW (ref 4.22–5.81)
RDW: 13.4 % (ref 11.5–15.5)
WBC: 5.4 10*3/uL (ref 4.0–10.5)
nRBC: 0 % (ref 0.0–0.2)

## 2022-06-22 LAB — BASIC METABOLIC PANEL
Anion gap: 9 (ref 5–15)
BUN: 6 mg/dL — ABNORMAL LOW (ref 8–23)
CO2: 26 mmol/L (ref 22–32)
Calcium: 8 mg/dL — ABNORMAL LOW (ref 8.9–10.3)
Chloride: 100 mmol/L (ref 98–111)
Creatinine, Ser: 0.6 mg/dL — ABNORMAL LOW (ref 0.61–1.24)
GFR, Estimated: >60 mL/min (ref >60)
Glucose, Bld: 79 mg/dL (ref 70–99)
Potassium: 3.1 mmol/L — ABNORMAL LOW (ref 3.5–5.1)
Sodium: 135 mmol/L (ref 135–145)

## 2022-06-22 LAB — MAGNESIUM: Magnesium: 1.5 mg/dL — ABNORMAL LOW (ref 1.7–2.4)

## 2022-06-22 MED ORDER — AMOXICILLIN-POT CLAVULANATE 875-125 MG PO TABS
1.0000 | ORAL_TABLET | Freq: Two times a day (BID) | ORAL | Status: DC
  Administered 2022-06-22 – 2022-06-25 (×6): 1 via ORAL
  Filled 2022-06-22 (×6): qty 1

## 2022-06-22 MED ORDER — POTASSIUM CHLORIDE CRYS ER 20 MEQ PO TBCR
40.0000 meq | EXTENDED_RELEASE_TABLET | ORAL | Status: AC
  Administered 2022-06-22 (×2): 40 meq via ORAL
  Filled 2022-06-22 (×2): qty 2

## 2022-06-22 MED ORDER — MAGNESIUM SULFATE 4 GM/100ML IV SOLN
4.0000 g | Freq: Once | INTRAVENOUS | Status: AC
  Administered 2022-06-22: 4 g via INTRAVENOUS
  Filled 2022-06-22: qty 100

## 2022-06-22 NOTE — Care Management Important Message (Signed)
Important Message  Patient Details  Name: Jacob Frank MRN: [id number] Date of Birth: Sep 10, 1951   Medicare Important Message Given:  Yes     Tommy Medal 06/22/2022, 11:29 AM

## 2022-06-22 NOTE — Progress Notes (Addendum)
Progress Note   Patient: Jacob Frank MHD:[id number] DOB: Feb 14, 1951 DOA: 06/17/2022     5 DOS: the patient was seen and examined on 06/22/2022   Brief hospital course: Jacob Frank is a 71 y.o. male with medical history significant of alcohol abuse, paroxysmal atrial fibrillation, orthostatic hypotension, gastroesophageal reflux disease, COPD, chronic diastolic heart failure, tobacco abuse, vitamin D 12 deficiency and vitamin D deficiency; presented to the hospital after experiencing syncope event at home landing on his left side and having difficulties bearing weight and experiencing uncontrolled pain. Patient expressed multiple episodes of passing out especially when changing positions rapidly and using alcohol; patient reports drinking some beers with his roommate the night prior to falling.  There was no chest pain, palpitations, headaches, focal weakness, dizziness or lightheadedness; nausea, vomiting, shortness of breath or any other complaints.   In the ED work-up demonstrated left intertrochanteric fracture; patient found to be in A-fib with RVR with an alcohol level of 23.  He was also dehydrated and hyponatremic.  Fluid resuscitation, metoprolol IV as rate control agent were provided as long with analgesic therapy for left hip pain.  Orthopedic service consulted and TRH contacted to place patient in the hospital for further evaluation and management.  Assessment and Plan: 1) Intertrochanteric fracture of left hip (HCC) -Continue analgesic therapy and supportive care -s/p ORIF 06/19/2022) PT OT eval appreciated recommends SNF rehab  2) acute metabolic encephalopathy--suspect some degree of alcohol withdrawal/DTs -Mentation improving overall -Continue benzos and multivitamin- -continue delirium precautions/redirection  3) acute hypoxic respiratory failure--- multifactorial in the postop patient Repeat chest x-ray suggest emphysema cannot rule out pneumonia treat empirically with  Augmentin -Cannot rule out aspiration component given altered mentation/lethargy in the postop alcoholic patient  Vitamin D deficiency -vitamin D level 26.17 -Continue weekly oral supplementation.  Atrial fibrillation with RVR ([place]) - Present at time of admission -After hydration and resumption of home beta-blocker and adjusted dose patient's RVR component has resolved. -Continue telemetry monitoring. -Continue to follow electrolytes and replete as needed (goal is for potassium above 4 and magnesium above 2  Hyponatremia -Chronic in the setting of alcohol abuse -Maintain adequate hydration -Alcohol cessation and adequate nutrition discussed with patient.  Hypomagnesemia/hypokalemia----in an alcoholic male, replace recheck  Vitamin B12 deficiency -Continue outpatient monthly injection for B12 repletion.  Tobacco dependence -Cessation counseling provided -Continue nicotine patch.  HTN (hypertension), benign -Soft on presentation; patient with underlying history of orthostatic hypotension -Continue the use of midodrine -  COPD (chronic obstructive pulmonary disease) (HCC) -Stable without acute exacerbation, no hypoxia -Continue bronchodilators  Subjective:  -More cooperative -Less confused, less disoriented -Oral intake is fair  Physical Exam: Vitals:   06/21/22 2110 06/22/22 0547 06/22/22 0714 06/22/22 1419  BP: (!) 151/93 (!) 167/94  (!) 148/99  Pulse: 79 75  79  Resp: '16 18  16  '$ Temp: 98.2 F (36.8 C) 98 F (36.7 C)  98.1 F (36.7 C)  TempSrc: Oral Oral  Oral  SpO2: 96% 94% 95% 96%  Weight:      Height:        Physical Exam  Gen:-Somewhat sleepy, cooperative  HEENT:- [place].AT, No sclera icterus Neck-Supple Neck,No JVD,.  Lungs-  CTAB , fair air movement bilaterally  CV- S1, S2 normal, RRR Abd-  +ve B.Sounds, Abd Soft, No tenderness,    Extremity/Skin:- No  edema,   good pedal pulses  Psych-less confused and less disoriented,   neuro-no new focal  deficits, improved +ve tremors MSK--Lt Hip wound is  clean and intact GU-Foley removed 06/20/2022  Family Communication: None at bedside.  Disposition: Anticipate discharge to SNF rehab once delirium improves and once TOC/social workers are able to the current legal guardian or decision maker to sign for patient to go to SNF rehab  Status is: Inpatient Remains inpatient appropriate because: Awaiting SNF rehab   Planned Discharge Destination: Determined; will most likely will require skilled nursing facility for rehabilitation and conditioning.   Author: Roxan Hockey, MD 06/22/2022 7:06 PM  For on call review [email or web address].

## 2022-06-22 NOTE — Progress Notes (Signed)
Physical Therapy Treatment Patient Details Name: Jacob Frank MRN: [id number] DOB: 1951/02/16 Today's Date: 06/22/2022   History of Present Illness Jacob Frank is a 71 y.o. male s/p open treatment internal fixation left hip on 06/19/22  with medical history significant of alcohol abuse, paroxysmal atrial fibrillation, orthostatic hypotension, gastroesophageal reflux disease, COPD, chronic diastolic heart failure, tobacco abuse, vitamin D 12 deficiency and vitamin D deficiency; presented to the hospital after experiencing syncope event at home landing on his left side and having difficulties bearing weight and experiencing uncontrolled pain.  Patient expressed multiple episodes of passing out especially when changing positions rapidly and using alcohol; patient reports drinking some beers with his roommate the night prior to falling.  There was no chest pain, palpitations, headaches, focal weakness, dizziness or lightheadedness; nausea, vomiting, shortness of breath or any other complaints.    PT Comments    Patient requires assist for LLE mobility and to upright trunk to transition to seated EOB. He demonstrates good sitting balance and sitting tolerance. He requires assist and RW to power up to standing  and demonstrates very limited standing tolerance due to LLE pain and fatigue. He is assisted back to bed after completing seated exercises and is left with nursing. Patient will benefit from continued skilled physical therapy in hospital and recommended venue below to increase strength, balance, endurance for safe ADLs and gait.    Recommendations for follow up therapy are one component of a multi-disciplinary discharge planning process, led by the attending physician.  Recommendations may be updated based on patient status, additional functional criteria and insurance authorization.  Follow Up Recommendations  Skilled nursing-short term rehab (<3 hours/day) Can patient physically be transported by  private vehicle: No   Assistance Recommended at Discharge Intermittent Supervision/Assistance  Patient can return home with the following A lot of help with walking and/or transfers;A lot of help with bathing/dressing/bathroom;Help with stairs or ramp for entrance;Assistance with cooking/housework   Equipment Recommendations  Rolling walker (2 wheels)    Recommendations for Other Services       Precautions / Restrictions Precautions Precautions: Fall Restrictions Weight Bearing Restrictions: Yes LLE Weight Bearing: Weight bearing as tolerated     Mobility  Bed Mobility Overal bed mobility: Needs Assistance Bed Mobility: Supine to Sit, Sit to Supine     Supine to sit: Min assist, Mod assist Sit to supine: Min assist   General bed mobility comments: increased time, labored movement    Transfers Overall transfer level: Needs assistance Equipment used: Rolling walker (2 wheels)   Sit to Stand: Mod assist   Step pivot transfers: Mod assist       General transfer comment: requires assist to power up to standing with RW    Ambulation/Gait                   Stairs             Wheelchair Mobility    Modified Rankin (Stroke Patients Only)       Balance Overall balance assessment: Needs assistance Sitting-balance support: Feet supported, No upper extremity supported Sitting balance-Leahy Scale: Fair Sitting balance - Comments: fair/good seated at EOB   Standing balance support: Reliant on assistive device for balance, During functional activity, Bilateral upper extremity supported Standing balance-Leahy Scale: Poor Standing balance comment: poor using RW                            Cognition  Arousal/Alertness: Awake/alert Behavior During Therapy: WFL for tasks assessed/performed Overall Cognitive Status: Within Functional Limits for tasks assessed                                          Exercises General  Exercises - Lower Extremity Long Arc Quad: AROM, Right, 10 reps, Left, 5 reps, Seated Toe Raises: AROM, Both, 10 reps, Seated Heel Raises: AROM, Both, 10 reps, Seated    General Comments        Pertinent Vitals/Pain Pain Assessment Faces Pain Scale: Hurts even more Pain Location: left hip with movement Pain Descriptors / Indicators: Grimacing, Guarding, Sore Pain Intervention(s): Limited activity within patient's tolerance, Monitored during session, Repositioned    Home Living                          Prior Function            PT Goals (current goals can now be found in the care plan section) Acute Rehab PT Goals Patient Stated Goal: return home PT Goal Formulation: With patient Time For Goal Achievement: 07/04/22 Potential to Achieve Goals: Good Progress towards PT goals: Progressing toward goals    Frequency    Min 4X/week      PT Plan Current plan remains appropriate    Co-evaluation              AM-PAC PT "6 Clicks" Mobility   Outcome Measure  Help needed turning from your back to your side while in a flat bed without using bedrails?: A Lot Help needed moving from lying on your back to sitting on the side of a flat bed without using bedrails?: A Lot Help needed moving to and from a bed to a chair (including a wheelchair)?: A Lot Help needed standing up from a chair using your arms (e.g., wheelchair or bedside chair)?: A Lot Help needed to walk in hospital room?: A Lot Help needed climbing 3-5 steps with a railing? : Total 6 Click Score: 11    End of Session Equipment Utilized During Treatment: Oxygen;Gait belt Activity Tolerance: Patient tolerated treatment well;Patient limited by fatigue;Patient limited by pain Patient left: in bed;with call bell/phone within reach;with nursing/sitter in room Nurse Communication: Mobility status PT Visit Diagnosis: Unsteadiness on feet (R26.81);Other abnormalities of gait and mobility (R26.89);Muscle  weakness (generalized) (M62.81)     Time: 9629-5284 PT Time Calculation (min) (ACUTE ONLY): 14 min  Charges:  $Therapeutic Activity: 8-22 mins                     12:22 PM, 06/22/22 Mearl Latin PT, DPT Physical Therapist at [facility]

## 2022-06-22 NOTE — Progress Notes (Signed)
Patient slept on and off this shift, waking when staff entered room to put his oxygen back on. Patient complained of leg pain and '5mg'$  Norco given PO. This nurse reoriented patient to current time, patient alert to self and knows he has a hurt leg but if forgetful at times. Patient did transfer from bed to bedside commode with assistance and had one bowel movement this shift.

## 2022-06-23 DIAGNOSIS — I10 Essential (primary) hypertension: Secondary | ICD-10-CM | POA: Diagnosis not present

## 2022-06-23 DIAGNOSIS — S72145A Nondisplaced intertrochanteric fracture of left femur, initial encounter for closed fracture: Secondary | ICD-10-CM | POA: Diagnosis not present

## 2022-06-23 LAB — RENAL FUNCTION PANEL
Albumin: 3 g/dL — ABNORMAL LOW (ref 3.5–5.0)
Anion gap: 8 (ref 5–15)
BUN: 7 mg/dL — ABNORMAL LOW (ref 8–23)
CO2: 30 mmol/L (ref 22–32)
Calcium: 8 mg/dL — ABNORMAL LOW (ref 8.9–10.3)
Chloride: 96 mmol/L — ABNORMAL LOW (ref 98–111)
Creatinine, Ser: 0.6 mg/dL — ABNORMAL LOW (ref 0.61–1.24)
GFR, Estimated: >60 mL/min (ref >60)
Glucose, Bld: 87 mg/dL (ref 70–99)
Phosphorus: 2.4 mg/dL — ABNORMAL LOW (ref 2.5–4.6)
Potassium: 3.2 mmol/L — ABNORMAL LOW (ref 3.5–5.1)
Sodium: 134 mmol/L — ABNORMAL LOW (ref 135–145)

## 2022-06-23 MED ORDER — DIAZEPAM 2 MG PO TABS
2.0000 mg | ORAL_TABLET | Freq: Every day | ORAL | Status: AC
  Administered 2022-06-23 – 2022-06-24 (×2): 2 mg via ORAL
  Filled 2022-06-23 (×2): qty 1

## 2022-06-23 MED ORDER — POTASSIUM CHLORIDE 20 MEQ PO PACK
40.0000 meq | PACK | ORAL | Status: AC
  Administered 2022-06-23 (×2): 40 meq via ORAL
  Filled 2022-06-23 (×2): qty 2

## 2022-06-23 MED ORDER — SENNOSIDES-DOCUSATE SODIUM 8.6-50 MG PO TABS
2.0000 | ORAL_TABLET | Freq: Every day | ORAL | Status: DC
  Administered 2022-06-23 – 2022-06-24 (×2): 2 via ORAL
  Filled 2022-06-23 (×2): qty 2

## 2022-06-23 NOTE — Progress Notes (Signed)
Progress Note   Patient: Jacob Frank CVE:[id number] DOB: 17-Mar-1951 DOA: 06/17/2022     6 DOS: the patient was seen and examined on 06/23/2022   Brief hospital course: Jacob Frank is a 71 y.o. male with medical history significant of alcohol abuse, paroxysmal atrial fibrillation, orthostatic hypotension, gastroesophageal reflux disease, COPD, chronic diastolic heart failure, tobacco abuse, vitamin D 12 deficiency and vitamin D deficiency; presented to the hospital after experiencing syncope event at home landing on his left side and having difficulties bearing weight and experiencing uncontrolled pain. Patient expressed multiple episodes of passing out especially when changing positions rapidly and using alcohol; patient reports drinking some beers with his roommate the night prior to falling.  There was no chest pain, palpitations, headaches, focal weakness, dizziness or lightheadedness; nausea, vomiting, shortness of breath or any other complaints.   In the ED work-up demonstrated left intertrochanteric fracture; patient found to be in A-fib with RVR with an alcohol level of 23.  He was also dehydrated and hyponatremic.  Fluid resuscitation, metoprolol IV as rate control agent were provided as long with analgesic therapy for left hip pain.  Orthopedic service consulted and TRH contacted to place patient in the hospital for further evaluation and management.  Assessment and Plan: 1) Intertrochanteric fracture of left hip (HCC) -Continue analgesic therapy and supportive care -s/p ORIF 06/19/2022) PT OT eval appreciated recommends SNF rehab -Medically stable for discharge to SNF facility-TOC working on guardianship  2) acute metabolic encephalopathy--suspect some degree of alcohol withdrawal/DTs -Mentation improving overall -Continue benzos and multivitamin- -continue delirium precautions/redirection  3) acute hypoxic respiratory failure--- multifactorial in the postop patient Repeat chest x-ray  suggest emphysema cannot rule out pneumonia treat empirically with Augmentin -Cannot rule out aspiration component given altered mentation/lethargy in the postop alcoholic patient  Vitamin D deficiency -vitamin D level 26.17 -Continue weekly oral supplementation.  Atrial fibrillation with RVR ([place]) - Present at time of admission -After hydration and resumption of home beta-blocker and adjusted dose patient's RVR component has resolved. -Continue telemetry monitoring. -Continue to follow electrolytes and replete as needed (goal is for potassium above 4 and magnesium above 2  Hyponatremia -Chronic in the setting of alcohol abuse -Maintain adequate hydration -Alcohol cessation and adequate nutrition discussed with patient.  Hypomagnesemia/hypokalemia----in an alcoholic male, replace recheck  Vitamin B12 deficiency -Continue outpatient monthly injection for B12 repletion.  Tobacco dependence -Cessation counseling provided -Continue nicotine patch.  HTN (hypertension), benign -Soft on presentation; patient with underlying history of orthostatic hypotension -Continue the use of midodrine -  COPD (chronic obstructive pulmonary disease) (HCC) -Stable without acute exacerbation, no hypoxia -Continue bronchodilators  Subjective:  -More cooperative -Less confused, less disoriented -No fevers no vomiting no additional new concerns --Medically stable for discharge to SNF facility-TOC working on guardianship  Physical Exam: Vitals:   06/23/22 0748 06/23/22 0749 06/23/22 0816 06/23/22 1243  BP:   (!) 162/103 120/78  Pulse:   71 61  Resp:    20  Temp:    97.9 F (36.6 C)  TempSrc:    Oral  SpO2: (!) 85% 96%  99%  Weight:      Height:        Physical Exam  Gen:-Somewhat sleepy, cooperative  HEENT:- [place].AT, No sclera icterus Neck-Supple Neck,No JVD,.  Lungs-  CTAB , fair air movement bilaterally  CV- S1, S2 normal, RRR Abd-  +ve B.Sounds, Abd Soft, No tenderness,     Extremity/Skin:- No  edema,   good pedal pulses  Psych-  much less confused and much less disoriented,   neuro-generalized weakness no new focal deficits, improved +ve tremors MSK--Lt Hip wound is clean and intact GU-Foley removed 06/20/2022  Family Communication: None at bedside.  Disposition: Anticipate discharge to SNF rehab  --Medically stable for discharge to SNF facility-TOC working on guardianship  Status is: Inpatient Remains inpatient appropriate because: Awaiting SNF rehab    Author: Roxan Hockey, MD 06/23/2022 7:08 PM  For on call review [email or web address].

## 2022-06-23 NOTE — Progress Notes (Addendum)
Has tried to get out of bed several times tonight but is easily redirectable.  Knows name and birthday and thought it was 2023.  Continually takes off oxygen. Received dilaudid earlier in shift with scheduled meds.

## 2022-06-23 NOTE — Progress Notes (Signed)
Requested ativan to help relax.

## 2022-06-23 NOTE — Progress Notes (Signed)
Pulled off malewick.  Needed complete bed change.  Now alert to year and knows who president is but says that he is in [facility].  Stated that he needs rehab and it's because he broke his hip.  Asked for more pain medicine.  Has slept some tonight. Tele monitor and SCDs put back on

## 2022-06-24 DIAGNOSIS — I10 Essential (primary) hypertension: Secondary | ICD-10-CM | POA: Diagnosis not present

## 2022-06-24 DIAGNOSIS — J449 Chronic obstructive pulmonary disease, unspecified: Secondary | ICD-10-CM | POA: Diagnosis not present

## 2022-06-24 DIAGNOSIS — I4891 Unspecified atrial fibrillation: Secondary | ICD-10-CM | POA: Diagnosis not present

## 2022-06-24 DIAGNOSIS — S72145A Nondisplaced intertrochanteric fracture of left femur, initial encounter for closed fracture: Secondary | ICD-10-CM | POA: Diagnosis not present

## 2022-06-24 LAB — RENAL FUNCTION PANEL
Albumin: 3 g/dL — ABNORMAL LOW (ref 3.5–5.0)
Anion gap: 5 (ref 5–15)
BUN: 13 mg/dL (ref 8–23)
CO2: 29 mmol/L (ref 22–32)
Calcium: 8.3 mg/dL — ABNORMAL LOW (ref 8.9–10.3)
Chloride: 101 mmol/L (ref 98–111)
Creatinine, Ser: 0.86 mg/dL (ref 0.61–1.24)
GFR, Estimated: >60 mL/min (ref >60)
Glucose, Bld: 93 mg/dL (ref 70–99)
Phosphorus: 3.3 mg/dL (ref 2.5–4.6)
Potassium: 4.4 mmol/L (ref 3.5–5.1)
Sodium: 135 mmol/L (ref 135–145)

## 2022-06-24 MED ORDER — AMLODIPINE BESYLATE 5 MG PO TABS
5.0000 mg | ORAL_TABLET | Freq: Every day | ORAL | Status: DC
  Administered 2022-06-24: 5 mg via ORAL
  Filled 2022-06-24 (×2): qty 1

## 2022-06-24 MED ORDER — HYDRALAZINE HCL 20 MG/ML IJ SOLN: 10.0000 mg | Freq: Four times a day (QID) | INTRAMUSCULAR | Status: DC | PRN

## 2022-06-24 NOTE — Progress Notes (Signed)
Progress Note   Patient: Jacob Frank [id number]:[id number] DOB: 06/24/1951 DOA: 06/17/2022     7 DOS: the patient was seen and examined on 06/24/2022   Brief hospital course: Jacob Frank is a 71 y.o. male with medical history significant of alcohol abuse, paroxysmal atrial fibrillation, orthostatic hypotension, gastroesophageal reflux disease, COPD, chronic diastolic heart failure, tobacco abuse, vitamin D 12 deficiency and vitamin D deficiency; presented to the hospital after experiencing syncope event at home landing on his left side and having difficulties bearing weight and experiencing uncontrolled pain. Patient expressed multiple episodes of passing out especially when changing positions rapidly and using alcohol; patient reports drinking some beers with his roommate the night prior to falling.  There was no chest pain, palpitations, headaches, focal weakness, dizziness or lightheadedness; nausea, vomiting, shortness of breath or any other complaints.   In the ED work-up demonstrated left intertrochanteric fracture; patient found to be in A-fib with RVR with an alcohol level of 23.  He was also dehydrated and hyponatremic.  Fluid resuscitation, metoprolol IV as rate control agent were provided as long with analgesic therapy for left hip pain.  Orthopedic service consulted and TRH contacted to place patient in the hospital for further evaluation and management.  Assessment and Plan: 1) Intertrochanteric fracture of left hip (HCC) -Continue analgesic therapy and supportive care -s/p ORIF 06/19/2022) PT OT eval appreciated recommends SNF rehab -06/24/22 --Remains Medically stable for discharge to SNF facility-TOC working on guardianship  2) acute metabolic encephalopathy--suspect some degree of alcohol withdrawal/DTs -Mentation improving overall -Continue benzos and multivitamin- -alcohol withdrawal symptoms largely resolved   3) acute hypoxic respiratory failure--- multifactorial in the postop  patient Repeat chest x-ray suggest emphysema cannot rule out pneumonia treat empirically with Augmentin -Cannot rule out aspiration component given altered mentation/lethargy in the postop alcoholic patient  Vitamin D deficiency -vitamin D level 26.17 -Continue weekly oral supplementation.  Atrial fibrillation with RVR (Jacob Frank) - Present at time of admission -After hydration and resumption of home beta-blocker and adjusted dose patient's RVR component has resolved. -Continue telemetry monitoring. -Continue to follow electrolytes and replete as needed (goal is for potassium above 4 and magnesium above 2 -Continue Toprol-XL 150 mg daily  Hyponatremia -Chronic in the setting of alcohol abuse -Sodium mostly normalized at this time -Alcohol cessation and adequate nutrition discussed with patient.  Hypomagnesemia/hypokalemia----in an alcoholic male, replace recheck  Vitamin B12 deficiency -Continue outpatient monthly injection for B12 repletion.  Tobacco dependence -Cessation counseling provided -Continue nicotine patch.  HTN (hypertension), benign -BP running higher stop midodrine -Amlodipine 5 mg daily -Continue Toprol-XL 150 mg daily -May use IV hydralazine as needed elevated BP  COPD (chronic obstructive pulmonary disease) ([name]) -Stable without acute exacerbation, no hypoxia -Continue bronchodilators  Subjective:  -More cooperative -Less confused, less disoriented -No fevers no vomiting no additional new concerns 06/24/22 --Remains Medically stable for discharge to SNF facility-TOC working on guardianship  Physical Exam: Vitals:   06/24/22 0625 06/24/22 0817 06/24/22 0821 06/24/22 0826  BP: (!) 149/86   (!) 160/102  Pulse: 65   67  Resp: 18     Temp: 97.7 F (36.5 C)     TempSrc: Oral     SpO2: 93% (!) 86% 96%   Weight:      Height:        Physical Exam  Gen:-Somewhat sleepy, cooperative  HEENT:- [name].AT, No sclera icterus Neck-Supple Neck,No JVD,.  Lungs-   CTAB , fair air movement bilaterally  CV- S1, S2 normal, RRR  Abd-  +ve B.Sounds, Abd Soft, No tenderness,    Extremity/Skin:- No  edema,   good pedal pulses  Psych-appropriate affect, cooperative, occasional episodes of confusion and disorientation neuro-generalized weakness no new focal deficits, no significant tremors MSK--Lt Hip wound is clean and intact GU-Foley removed 06/20/2022  Family Communication: None at bedside.  Disposition: Anticipate discharge to SNF rehab  --Medically stable for discharge to SNF facility-TOC working on guardianship  Status is: Inpatient Remains inpatient appropriate because: Awaiting SNF rehab   Author: Roxan Hockey, MD 06/24/2022 10:39 AM  For on call review [email or web address].

## 2022-06-25 DIAGNOSIS — Z5181 Encounter for therapeutic drug level monitoring: Secondary | ICD-10-CM | POA: Diagnosis not present

## 2022-06-25 DIAGNOSIS — R269 Unspecified abnormalities of gait and mobility: Secondary | ICD-10-CM | POA: Diagnosis not present

## 2022-06-25 DIAGNOSIS — M1389 Other specified arthritis, multiple sites: Secondary | ICD-10-CM | POA: Diagnosis not present

## 2022-06-25 DIAGNOSIS — S72142D Displaced intertrochanteric fracture of left femur, subsequent encounter for closed fracture with routine healing: Secondary | ICD-10-CM | POA: Diagnosis not present

## 2022-06-25 DIAGNOSIS — R5381 Other malaise: Secondary | ICD-10-CM | POA: Diagnosis not present

## 2022-06-25 DIAGNOSIS — G9341 Metabolic encephalopathy: Secondary | ICD-10-CM | POA: Diagnosis not present

## 2022-06-25 DIAGNOSIS — I10 Essential (primary) hypertension: Secondary | ICD-10-CM | POA: Diagnosis not present

## 2022-06-25 DIAGNOSIS — I700 Atherosclerosis of aorta: Secondary | ICD-10-CM | POA: Diagnosis not present

## 2022-06-25 DIAGNOSIS — R0789 Other chest pain: Secondary | ICD-10-CM

## 2022-06-25 DIAGNOSIS — Z20822 Contact with and (suspected) exposure to covid-19: Secondary | ICD-10-CM | POA: Diagnosis not present

## 2022-06-25 DIAGNOSIS — K7460 Unspecified cirrhosis of liver: Secondary | ICD-10-CM | POA: Diagnosis not present

## 2022-06-25 DIAGNOSIS — R52 Pain, unspecified: Secondary | ICD-10-CM | POA: Diagnosis not present

## 2022-06-25 DIAGNOSIS — I959 Hypotension, unspecified: Secondary | ICD-10-CM | POA: Diagnosis not present

## 2022-06-25 DIAGNOSIS — J9601 Acute respiratory failure with hypoxia: Secondary | ICD-10-CM | POA: Diagnosis not present

## 2022-06-25 DIAGNOSIS — Z87891 Personal history of nicotine dependence: Secondary | ICD-10-CM | POA: Diagnosis not present

## 2022-06-25 DIAGNOSIS — S72145D Nondisplaced intertrochanteric fracture of left femur, subsequent encounter for closed fracture with routine healing: Secondary | ICD-10-CM | POA: Diagnosis not present

## 2022-06-25 DIAGNOSIS — Z743 Need for continuous supervision: Secondary | ICD-10-CM | POA: Diagnosis not present

## 2022-06-25 DIAGNOSIS — E559 Vitamin D deficiency, unspecified: Secondary | ICD-10-CM | POA: Diagnosis not present

## 2022-06-25 DIAGNOSIS — J439 Emphysema, unspecified: Secondary | ICD-10-CM | POA: Diagnosis not present

## 2022-06-25 DIAGNOSIS — I4891 Unspecified atrial fibrillation: Secondary | ICD-10-CM | POA: Diagnosis not present

## 2022-06-25 DIAGNOSIS — S72145A Nondisplaced intertrochanteric fracture of left femur, initial encounter for closed fracture: Secondary | ICD-10-CM | POA: Diagnosis not present

## 2022-06-25 DIAGNOSIS — J449 Chronic obstructive pulmonary disease, unspecified: Secondary | ICD-10-CM | POA: Diagnosis not present

## 2022-06-25 DIAGNOSIS — R278 Other lack of coordination: Secondary | ICD-10-CM | POA: Diagnosis not present

## 2022-06-25 DIAGNOSIS — E871 Hypo-osmolality and hyponatremia: Secondary | ICD-10-CM | POA: Diagnosis not present

## 2022-06-25 DIAGNOSIS — Z5329 Procedure and treatment not carried out because of patient's decision for other reasons: Secondary | ICD-10-CM | POA: Diagnosis not present

## 2022-06-25 DIAGNOSIS — I5032 Chronic diastolic (congestive) heart failure: Secondary | ICD-10-CM | POA: Diagnosis not present

## 2022-06-25 DIAGNOSIS — R531 Weakness: Secondary | ICD-10-CM | POA: Diagnosis not present

## 2022-06-25 DIAGNOSIS — E538 Deficiency of other specified B group vitamins: Secondary | ICD-10-CM | POA: Diagnosis not present

## 2022-06-25 DIAGNOSIS — M6281 Muscle weakness (generalized): Secondary | ICD-10-CM | POA: Diagnosis not present

## 2022-06-25 DIAGNOSIS — I110 Hypertensive heart disease with heart failure: Secondary | ICD-10-CM | POA: Diagnosis not present

## 2022-06-25 DIAGNOSIS — Z7401 Bed confinement status: Secondary | ICD-10-CM | POA: Diagnosis not present

## 2022-06-25 DIAGNOSIS — Z4789 Encounter for other orthopedic aftercare: Secondary | ICD-10-CM | POA: Diagnosis not present

## 2022-06-25 LAB — BASIC METABOLIC PANEL
Anion gap: 8 (ref 5–15)
BUN: 11 mg/dL (ref 8–23)
CO2: 29 mmol/L (ref 22–32)
Calcium: 8.4 mg/dL — ABNORMAL LOW (ref 8.9–10.3)
Chloride: 99 mmol/L (ref 98–111)
Creatinine, Ser: 0.73 mg/dL (ref 0.61–1.24)
GFR, Estimated: >60 mL/min (ref >60)
Glucose, Bld: 107 mg/dL — ABNORMAL HIGH (ref 70–99)
Potassium: 3.9 mmol/L (ref 3.5–5.1)
Sodium: 136 mmol/L (ref 135–145)

## 2022-06-25 LAB — CBC
HCT: 33 % — ABNORMAL LOW (ref 39.0–52.0)
Hemoglobin: 11 g/dL — ABNORMAL LOW (ref 13.0–17.0)
MCH: 32.7 pg (ref 26.0–34.0)
MCHC: 33.3 g/dL (ref 30.0–36.0)
MCV: 98.2 fL (ref 80.0–100.0)
Platelets: 235 10*3/uL (ref 150–400)
RBC: 3.36 MIL/uL — ABNORMAL LOW (ref 4.22–5.81)
RDW: 13.6 % (ref 11.5–15.5)
WBC: 6.2 10*3/uL (ref 4.0–10.5)
nRBC: 0 % (ref 0.0–0.2)

## 2022-06-25 LAB — TROPONIN I (HIGH SENSITIVITY)
Troponin I (High Sensitivity): 2 ng/L (ref <18)
Troponin I (High Sensitivity): 2 ng/L (ref <18)

## 2022-06-25 LAB — PHOSPHORUS: Phosphorus: 4 mg/dL (ref 2.5–4.6)

## 2022-06-25 LAB — MAGNESIUM: Magnesium: 1.7 mg/dL (ref 1.7–2.4)

## 2022-06-25 MED ORDER — METOPROLOL SUCCINATE ER 100 MG PO TB24: ORAL_TABLET | ORAL | 1 refills | Status: AC

## 2022-06-25 MED ORDER — AMOXICILLIN-POT CLAVULANATE 875-125 MG PO TABS: 1.0000 | ORAL_TABLET | Freq: Two times a day (BID) | ORAL | 0 refills | Status: AC

## 2022-06-25 MED ORDER — RIVAROXABAN 10 MG PO TABS: 10.0000 mg | ORAL_TABLET | Freq: Every day | ORAL | 0 refills | Status: DC

## 2022-06-25 MED ORDER — QUETIAPINE FUMARATE 200 MG PO TABS: 200.0000 mg | ORAL_TABLET | Freq: Every day | ORAL | 2 refills | Status: AC

## 2022-06-25 MED ORDER — PANTOPRAZOLE SODIUM 40 MG PO TBEC: 40.0000 mg | DELAYED_RELEASE_TABLET | Freq: Every day | ORAL | 3 refills | Status: AC

## 2022-06-25 MED ORDER — NICOTINE 14 MG/24HR TD PT24: 14.0000 mg | MEDICATED_PATCH | Freq: Every day | TRANSDERMAL | 0 refills | Status: AC

## 2022-06-25 MED ORDER — AMLODIPINE BESYLATE 5 MG PO TABS
10.0000 mg | ORAL_TABLET | Freq: Every day | ORAL | Status: DC
  Administered 2022-06-25: 10 mg via ORAL

## 2022-06-25 MED ORDER — FLUTICASONE-SALMETEROL 232-14 MCG/ACT IN AEPB: 1.0000 | INHALATION_SPRAY | Freq: Two times a day (BID) | RESPIRATORY_TRACT | 3 refills | Status: AC

## 2022-06-25 MED ORDER — VITAMIN D (ERGOCALCIFEROL) 1.25 MG (50000 UNIT) PO CAPS: 50000.0000 [IU] | ORAL_CAPSULE | ORAL | 2 refills | Status: AC

## 2022-06-25 MED ORDER — PREDNISONE 20 MG PO TABS: 40.0000 mg | ORAL_TABLET | Freq: Every day | ORAL | 0 refills | Status: AC

## 2022-06-25 MED ORDER — SENNOSIDES-DOCUSATE SODIUM 8.6-50 MG PO TABS: 2.0000 | ORAL_TABLET | Freq: Every day | ORAL | 2 refills | Status: AC

## 2022-06-25 MED ORDER — AMLODIPINE BESYLATE 10 MG PO TABS: 10.0000 mg | ORAL_TABLET | Freq: Every day | ORAL | 3 refills | Status: AC

## 2022-06-25 MED ORDER — ISOSORBIDE MONONITRATE ER 30 MG PO TB24: 30.0000 mg | ORAL_TABLET | Freq: Every day | ORAL | 11 refills | Status: AC

## 2022-06-25 MED ORDER — ALPRAZOLAM 0.5 MG PO TABS: 0.5000 mg | ORAL_TABLET | Freq: Two times a day (BID) | ORAL | 0 refills | Status: DC | PRN

## 2022-06-25 MED ORDER — METHOCARBAMOL 500 MG PO TABS: 500.0000 mg | ORAL_TABLET | Freq: Two times a day (BID) | ORAL | 0 refills | Status: AC

## 2022-06-25 MED ORDER — THIAMINE HCL 100 MG PO TABS: 100.0000 mg | ORAL_TABLET | Freq: Every day | ORAL | 1 refills | Status: AC

## 2022-06-25 MED ORDER — ALBUTEROL SULFATE HFA 108 (90 BASE) MCG/ACT IN AERS: INHALATION_SPRAY | RESPIRATORY_TRACT | 2 refills | Status: AC

## 2022-06-25 MED ORDER — OXYCODONE HCL 5 MG PO TABS: 5.0000 mg | ORAL_TABLET | Freq: Four times a day (QID) | ORAL | 0 refills | Status: DC | PRN

## 2022-06-25 MED ORDER — ASPIRIN EC 81 MG PO TBEC: 81.0000 mg | DELAYED_RELEASE_TABLET | Freq: Every day | ORAL | 11 refills | Status: AC

## 2022-06-25 MED ORDER — ALBUTEROL SULFATE (2.5 MG/3ML) 0.083% IN NEBU: 2.5000 mg | INHALATION_SOLUTION | Freq: Four times a day (QID) | RESPIRATORY_TRACT | 1 refills | Status: AC | PRN

## 2022-06-25 MED ORDER — ADULT MULTIVITAMIN W/MINERALS CH: 1.0000 | ORAL_TABLET | Freq: Every day | ORAL | 1 refills | Status: AC

## 2022-06-25 NOTE — TOC Transition Note (Signed)
Transition of Care [facility]) - CM/SW Discharge Note   Patient Details  Name: Jacob [name] MRN: [id number] Date of Birth: 1951/04/10  [facility] CM/SW Contact:  Iona Beard, [place] Phone Number: 06/25/2022, 1:22 PM   Clinical Narrative:    CSW updated that pt is alert and able to make decisions per MD. CSW spoke with pt in room about SNF options. Pt states that he would like to accept bed at [facility]. CSW confirmed insurance Josem Kaufmann has been approved for SNF. CSW confirmed with Santiago Glad in admissions at [facility] rehab room and report numbers. CSW to complete med necessity. CSW to set up EMS when RN is ready. TOC signing off.   Final next level of care: Skilled Nursing Facility Barriers to Discharge: Continued Medical Work up   Patient Goals and CMS Choice Patient states their goals for this hospitalization and ongoing recovery are:: go to SNF CMS Medicare.gov Compare Post Acute Care list provided to:: Patient Choice offered to / list presented to : Patient  Discharge Placement              Patient chooses bed at: [facility] Patient to be transferred to facility by: EMS      Discharge Plan and Services                                     Social Determinants of Health (SDOH) Interventions     Readmission Risk Interventions    12/28/2020   12:55 PM  Readmission Risk Prevention Plan  Transportation Screening Complete  Home Care Screening Complete  Medication Review (RN CM) Complete

## 2022-06-25 NOTE — Discharge Instructions (Addendum)
1) left hip postop care as follows the weightbearing status is as tolerated DVT prevention with Xarelto 10 mg daily for 1 month from now [employer] out in 1 week from now Office appointment in  3 weeks from now for x-ray  2)Avoid ibuprofen/Advil/Aleve/Motrin/Goody Powders/Naproxen/BC powders/Meloxicam/Diclofenac/Indomethacin and other Nonsteroidal anti-inflammatory medications as these will make you more likely to bleed and can cause stomach ulcers, can also cause Kidney problems.   3) Xarelto 10 mg daily for DVT prophylaxis for 1 month only  4) CBC and BMP blood test in 1 week around October 1st

## 2022-06-25 NOTE — Care Management Important Message (Signed)
Important Message  Patient Details  Name: Jacob [name] MRN: [id number] Date of Birth: 02/27/1951   Medicare Important Message Given:  Yes     Tommy Medal 06/25/2022, 11:48 AM

## 2022-06-25 NOTE — Discharge Summary (Signed)
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  IncredibleEmployment.be  This test is no t yet approved or cleared by the [place] FDA and  has been authorized for detection and/or diagnosis of SARS-CoV-2 by FDA under an Emergency Use Authorization (EUA). This EUA will remain  in effect (meaning this test can be used) for the duration of the COVID-19 declaration under Section 564(b)(1) of  the Act, 21 U.S.C.section 360bbb-3(b)(1), unless the authorization is terminated  or revoked sooner.       Influenza A by PCR NEGATIVE NEGATIVE Final   Influenza B by PCR NEGATIVE NEGATIVE Final    Comment: (NOTE) The Xpert Xpress SARS-CoV-2/FLU/RSV plus assay is intended as an aid in the diagnosis of influenza from Nasopharyngeal swab specimens and should not be  used as a sole basis for treatment. Nasal washings and aspirates are unacceptable for Xpert Xpress SARS-CoV-2/FLU/RSV testing.  Fact Sheet for Patients: EntrepreneurPulse.com.au  Fact Sheet for Healthcare Providers: IncredibleEmployment.be  This test is not yet approved or cleared by the [place] FDA and has been authorized for detection and/or diagnosis of SARS-CoV-2 by FDA under an Emergency Use Authorization (EUA). This EUA will remain in effect (meaning this test can be used) for the duration of the COVID-19 declaration under Section 564(b)(1) of the Act, 21 U.S.C. section 360bbb-3(b)(1), unless the authorization is terminated or revoked.  Performed at [facility], [address], [place], [place] [address]   MRSA Next Gen by PCR, Nasal     Status: None   Collection Time: 06/17/22 11:41 AM   Specimen: Nasal Mucosa; Nasal Swab  Result Value Ref Range Status   MRSA by PCR Next Gen NOT DETECTED NOT DETECTED Final    Comment: (NOTE) The GeneXpert MRSA Assay (FDA approved for NASAL specimens only), is one component of a comprehensive MRSA colonization surveillance program. It is not intended to diagnose MRSA infection nor to guide or monitor treatment for MRSA infections. Test performance is not FDA approved in patients less than 4 years old. Performed at [facility], [address], [place], [place] [address]   Surgical pcr screen     Status: None   Collection Time: 06/19/22  1:37 AM   Specimen: Nasal Mucosa; Nasal Swab  Result Value Ref Range Status   MRSA, PCR NEGATIVE NEGATIVE Final   Staphylococcus aureus NEGATIVE NEGATIVE Final    Comment: (NOTE) The Xpert SA Assay (FDA approved for NASAL specimens in patients 19 years of age and older), is one component of a comprehensive surveillance program. It is not intended to diagnose infection nor to guide or monitor treatment. Performed at [facility], [address], [place],  [place] [address]     Today   Subjective    Jacob Jacob [name] today has no new complaints        .  Had brief episode of chest discomfort overnight EKG and troponins were reassuring -No further chest pains at this time No fever  Or chills   Patient has been seen and examined prior to discharge   Objective   Blood pressure (!) 152/106, pulse 78, temperature (!) 97.1 F (36.2 C), resp. rate 18, height '5\' 5"'$  (1.651 m), weight 78.2 kg, SpO2 100 %.   Intake/Output Summary (Last 24 hours) at 06/25/2022 1302 Last data filed at 06/25/2022 0500 Gross per 24 hour  Intake 720 ml  Output 1100 ml  Net -380 ml   Exam Gen:-Awake alert and oriented x3, in no acute distress, cooperative HEENT:- [place].AT, No sclera icterus Neck-Supple Neck,No JVD,.  Lungs-  CTAB , fair air movement bilaterally  CV- S1, S2 normal, RRR Abd-  +ve B.Sounds, Abd Soft, No tenderness,    Extremity/Skin:- No  edema,   good pedal pulses  Psych-appropriate affect, cooperative, mentation has improved significantly neuro-generalized weakness no new focal deficits, no significant tremors MSK--Lt Hip wound is clean and intact GU-Foley removed 06/20/2022  Data Review   CBC w Diff:  Lab Results  Component Value Date   WBC 6.2 06/25/2022   HGB 11.0 (L) 06/25/2022   HCT 33.0 (L) 06/25/2022   PLT 235 06/25/2022   LYMPHOPCT 25 01/21/2022   MONOPCT 13 01/21/2022   EOSPCT 1 01/21/2022   BASOPCT 1 01/21/2022   CMP:  Lab Results  Component Value Date   NA 136 06/25/2022   K 3.9 06/25/2022   CL 99 06/25/2022   CO2 29 06/25/2022   BUN 11 06/25/2022   CREATININE 0.73 06/25/2022   PROT 6.4 (L) 06/17/2022   ALBUMIN 3.0 (L) 06/24/2022   BILITOT 1.2 06/17/2022   ALKPHOS 44 06/17/2022   AST 32 06/17/2022   ALT 20 06/17/2022   Total Discharge time is about 33 minutes  Roxan Hockey M.D on 06/25/2022 at 1:02 PM  Go to [email or web address] -  for contact info  [facility] - Office  [phone]

## 2022-06-25 NOTE — Consult Note (Signed)
   [facility] Inpatient Consult   06/25/2022  Jacob [name] 06/04/1951 [id number]  [place] [facility] [ACO] Patient: Jacob [name]  Primary Care Provider:  [name], MD, [place] at [facility]  If the patient goes to a [facility] affiliated facility then, patient can be followed by [facility] [facility] PAC RN with traditional Medicare and approved Medicare Advantage plans.    Plan:   [facility] PAC RN can follow for any known or needs for transitional care needs for returning to post facility care or complex disease management.  Patient transitioned to [facility],  For questions or referrals, please contact:   Natividad Brood, RN BSN [facility]iaison  [phone] business mobile phone Toll free office [phone]  Fax number: [phone] [email or web address]

## 2022-06-25 NOTE — Progress Notes (Signed)
[facility] [place], report called to receiving facility. Discharge paper work placed in packet for facility. Waiting for EMS to transport.

## 2022-06-25 NOTE — Progress Notes (Signed)
RN called due to patient complaining of chest tightness, patient was without the supplemental oxygen when this occurred.  He was placed back on supplemental oxygen and when getting to bedside, he states that the chest tightness has resolved.  The chest tightness was nonreproducible and nonradiating. EKG and troponin were ordered and pending Morning labs were ordered

## 2022-06-27 ENCOUNTER — Encounter (HOSPITAL_COMMUNITY): Payer: Self-pay | Admitting: Orthopedic Surgery

## 2022-06-27 DIAGNOSIS — M1389 Other specified arthritis, multiple sites: Secondary | ICD-10-CM | POA: Diagnosis not present

## 2022-06-27 DIAGNOSIS — R5381 Other malaise: Secondary | ICD-10-CM | POA: Diagnosis not present

## 2022-06-27 DIAGNOSIS — Z4789 Encounter for other orthopedic aftercare: Secondary | ICD-10-CM | POA: Diagnosis not present

## 2022-06-27 DIAGNOSIS — I5032 Chronic diastolic (congestive) heart failure: Secondary | ICD-10-CM | POA: Diagnosis not present

## 2022-06-27 DIAGNOSIS — I4891 Unspecified atrial fibrillation: Secondary | ICD-10-CM | POA: Diagnosis not present

## 2022-06-27 DIAGNOSIS — J449 Chronic obstructive pulmonary disease, unspecified: Secondary | ICD-10-CM | POA: Diagnosis not present

## 2022-06-27 DIAGNOSIS — S72142D Displaced intertrochanteric fracture of left femur, subsequent encounter for closed fracture with routine healing: Secondary | ICD-10-CM | POA: Diagnosis not present

## 2022-06-27 DIAGNOSIS — S72145D Nondisplaced intertrochanteric fracture of left femur, subsequent encounter for closed fracture with routine healing: Secondary | ICD-10-CM | POA: Diagnosis not present

## 2022-06-27 DIAGNOSIS — J439 Emphysema, unspecified: Secondary | ICD-10-CM | POA: Diagnosis not present

## 2022-06-27 DIAGNOSIS — Z87891 Personal history of nicotine dependence: Secondary | ICD-10-CM | POA: Diagnosis not present

## 2022-06-29 DIAGNOSIS — S72145D Nondisplaced intertrochanteric fracture of left femur, subsequent encounter for closed fracture with routine healing: Secondary | ICD-10-CM | POA: Diagnosis not present

## 2022-06-29 DIAGNOSIS — R5381 Other malaise: Secondary | ICD-10-CM | POA: Diagnosis not present

## 2022-06-29 DIAGNOSIS — R52 Pain, unspecified: Secondary | ICD-10-CM | POA: Diagnosis not present

## 2022-06-29 DIAGNOSIS — S72142D Displaced intertrochanteric fracture of left femur, subsequent encounter for closed fracture with routine healing: Secondary | ICD-10-CM | POA: Diagnosis not present

## 2022-07-02 ENCOUNTER — Ambulatory Visit: Payer: Medicare Other | Admitting: Family Medicine

## 2022-07-02 DIAGNOSIS — S72142D Displaced intertrochanteric fracture of left femur, subsequent encounter for closed fracture with routine healing: Secondary | ICD-10-CM | POA: Diagnosis not present

## 2022-07-02 DIAGNOSIS — S72145D Nondisplaced intertrochanteric fracture of left femur, subsequent encounter for closed fracture with routine healing: Secondary | ICD-10-CM | POA: Diagnosis not present

## 2022-07-02 DIAGNOSIS — Z5329 Procedure and treatment not carried out because of patient's decision for other reasons: Secondary | ICD-10-CM | POA: Diagnosis not present
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
## 2022-07-09 ENCOUNTER — Ambulatory Visit: Payer: Medicare Other | Admitting: Family Medicine

## 2022-07-11 ENCOUNTER — Telehealth: Payer: Self-pay | Admitting: Family Medicine

## 2022-07-11 NOTE — Telephone Encounter (Signed)
Yes pls

## 2022-07-11 NOTE — Telephone Encounter (Signed)
Patient 3rd no show was 07/02/2022. Are sending dismissal letter at this time?

## 2022-07-13 ENCOUNTER — Encounter (HOSPITAL_COMMUNITY): Payer: Self-pay | Admitting: Emergency Medicine

## 2022-07-13 ENCOUNTER — Emergency Department (HOSPITAL_COMMUNITY): Payer: Medicare Other

## 2022-07-13 ENCOUNTER — Emergency Department (HOSPITAL_COMMUNITY)
Admission: EM | Admit: 2022-07-13 | Discharge: 2022-07-14 | Disposition: A | Discharge status: Home / Self Care | Payer: Medicare Other | Attending: Emergency Medicine | Admitting: Emergency Medicine

## 2022-07-13 DIAGNOSIS — M79606 Pain in leg, unspecified: Secondary | ICD-10-CM | POA: Diagnosis not present

## 2022-07-13 DIAGNOSIS — Z743 Need for continuous supervision: Secondary | ICD-10-CM | POA: Diagnosis not present

## 2022-07-13 DIAGNOSIS — R6889 Other general symptoms and signs: Secondary | ICD-10-CM | POA: Diagnosis not present

## 2022-07-13 DIAGNOSIS — Z79899 Other long term (current) drug therapy: Secondary | ICD-10-CM | POA: Insufficient documentation

## 2022-07-13 DIAGNOSIS — Z5900 Homelessness unspecified: Secondary | ICD-10-CM | POA: Diagnosis not present

## 2022-07-13 DIAGNOSIS — R531 Weakness: Secondary | ICD-10-CM | POA: Diagnosis not present

## 2022-07-13 DIAGNOSIS — J441 Chronic obstructive pulmonary disease with (acute) exacerbation: Secondary | ICD-10-CM | POA: Diagnosis not present

## 2022-07-13 DIAGNOSIS — J449 Chronic obstructive pulmonary disease, unspecified: Secondary | ICD-10-CM | POA: Diagnosis not present

## 2022-07-13 DIAGNOSIS — R0602 Shortness of breath: Secondary | ICD-10-CM | POA: Diagnosis not present

## 2022-07-13 DIAGNOSIS — Z7901 Long term (current) use of anticoagulants: Secondary | ICD-10-CM | POA: Insufficient documentation

## 2022-07-13 DIAGNOSIS — G8918 Other acute postprocedural pain: Secondary | ICD-10-CM | POA: Diagnosis not present

## 2022-07-13 DIAGNOSIS — F17210 Nicotine dependence, cigarettes, uncomplicated: Secondary | ICD-10-CM | POA: Insufficient documentation

## 2022-07-13 DIAGNOSIS — I10 Essential (primary) hypertension: Secondary | ICD-10-CM | POA: Diagnosis not present

## 2022-07-13 DIAGNOSIS — E871 Hypo-osmolality and hyponatremia: Secondary | ICD-10-CM

## 2022-07-13 DIAGNOSIS — M7989 Other specified soft tissue disorders: Secondary | ICD-10-CM

## 2022-07-13 DIAGNOSIS — Z7982 Long term (current) use of aspirin: Secondary | ICD-10-CM | POA: Insufficient documentation

## 2022-07-13 DIAGNOSIS — Z7951 Long term (current) use of inhaled steroids: Secondary | ICD-10-CM | POA: Insufficient documentation

## 2022-07-13 DIAGNOSIS — R069 Unspecified abnormalities of breathing: Secondary | ICD-10-CM | POA: Diagnosis not present

## 2022-07-13 LAB — CBC
HCT: 32.7 % — ABNORMAL LOW (ref 39.0–52.0)
Hemoglobin: 11.2 g/dL — ABNORMAL LOW (ref 13.0–17.0)
MCH: 32.5 pg (ref 26.0–34.0)
MCHC: 34.3 g/dL (ref 30.0–36.0)
MCV: 94.8 fL (ref 80.0–100.0)
Platelets: 159 10*3/uL (ref 150–400)
RBC: 3.45 MIL/uL — ABNORMAL LOW (ref 4.22–5.81)
RDW: 14.7 % (ref 11.5–15.5)
WBC: 6.7 10*3/uL (ref 4.0–10.5)
nRBC: 0 % (ref 0.0–0.2)

## 2022-07-13 LAB — BASIC METABOLIC PANEL
Anion gap: 12 (ref 5–15)
BUN: 18 mg/dL (ref 8–23)
CO2: 25 mmol/L (ref 22–32)
Calcium: 8.5 mg/dL — ABNORMAL LOW (ref 8.9–10.3)
Chloride: 92 mmol/L — ABNORMAL LOW (ref 98–111)
Creatinine, Ser: 0.88 mg/dL (ref 0.61–1.24)
GFR, Estimated: >60 mL/min (ref >60)
Glucose, Bld: 76 mg/dL (ref 70–99)
Potassium: 3.9 mmol/L (ref 3.5–5.1)
Sodium: 129 mmol/L — ABNORMAL LOW (ref 135–145)

## 2022-07-13 LAB — LACTIC ACID, PLASMA: Lactic Acid, Venous: 2.1 mmol/L (ref 0.5–1.9)

## 2022-07-13 LAB — BRAIN NATRIURETIC PEPTIDE: B Natriuretic Peptide: 185 pg/mL — ABNORMAL HIGH (ref 0.0–100.0)

## 2022-07-13 MED ORDER — IPRATROPIUM-ALBUTEROL 0.5-2.5 (3) MG/3ML IN SOLN
3.0000 mL | Freq: Once | RESPIRATORY_TRACT | Status: AC
  Administered 2022-07-13: 3 mL via RESPIRATORY_TRACT
  Filled 2022-07-13: qty 3

## 2022-07-13 MED ORDER — SODIUM CHLORIDE 0.9 % IV BOLUS
1000.0000 mL | Freq: Once | INTRAVENOUS | Status: AC
  Administered 2022-07-13: 1000 mL via INTRAVENOUS

## 2022-07-13 MED ORDER — ALBUTEROL SULFATE (2.5 MG/3ML) 0.083% IN NEBU
10.0000 mg/h | INHALATION_SOLUTION | Freq: Once | RESPIRATORY_TRACT | Status: AC
  Administered 2022-07-13: 10 mg/h via RESPIRATORY_TRACT
  Filled 2022-07-13: qty 12

## 2022-07-13 MED ORDER — METHYLPREDNISOLONE SODIUM SUCC 125 MG IJ SOLR
125.0000 mg | Freq: Once | INTRAMUSCULAR | Status: AC
  Administered 2022-07-13: 125 mg via INTRAVENOUS
  Filled 2022-07-13: qty 2

## 2022-07-13 NOTE — ED Notes (Signed)
Date and time results received: 07/13/22 2224   Test: LACTIC Critical Value: 2.1  Name of Provider Notified: Sabra Heck, MD

## 2022-07-13 NOTE — ED Provider Notes (Signed)
[facility] EMERGENCY DEPARTMENT Provider Note   CSN: [id number] Arrival date & time: 07/13/22  2053     History {Add pertinent medical, surgical, social history, OB history to HPI:1} Chief Complaint  Patient presents with   Shortness of Breath    Jacob Frank is a 71 y.o. male.   Shortness of Breath    This patient is a 71 year old male who arrives by EMS for shortness of breath.  The patient has a history of hypertension on amlodipine, he has a history of COPD and still continues to smoke cigarettes but does take occasional breathing treatments, he also is on rivaroxaban but is unclear if he is actually taking the medication.  I have reviewed the medical record, it appears that the patient had been admitted to the hospital in September, approximately 1 month ago during which time he had had a syncopal episode leading to an orthopedic fracture, he had unfortunately developed a intertrochanteric fracture that required surgery.  He went to a rehab, he finally signed himself out of the rehab to live with the person that he was currently living with however he is now being kicked out of that house.  This prompted him calling for ambulance transport this evening.  The patient states he has nowhere else to live.  He has a occasional cough he is not having fevers he is not having chest pain.  Home Medications Prior to Admission medications   Medication Sig Start Date End Date Taking? Authorizing Provider  albuterol (PROAIR HFA) 108 (90 Base) MCG/ACT inhaler 2 PUFFS EVERY FOUR HOURS AS NEEDED FOR WHEEZING 06/25/22   Emokpae, Courage, MD  albuterol (PROVENTIL) (2.5 MG/3ML) 0.083% nebulizer solution Take 3 mLs (2.5 mg total) by nebulization every 6 (six) hours as needed for shortness of breath. INHALE 1 VIAL VIA NEBULIZER EVERY 6 HOURS AS NEEDED FOR WHEEZING OR SHORTNESS OF BREATH. 06/25/22   Roxan Hockey, MD  ALPRAZolam Duanne Moron) 0.5 MG tablet Take 1 tablet (0.5 mg total) by mouth 2 (two) times  daily as needed for sleep or anxiety. 06/25/22 06/25/23  Roxan Hockey, MD  amLODipine (NORVASC) 10 MG tablet Take 1 tablet (10 mg total) by mouth daily. 06/26/22   Roxan Hockey, MD  aspirin EC 81 MG tablet Take 1 tablet (81 mg total) by mouth daily with breakfast. Swallow whole. 06/25/22   Emokpae, Courage, MD  buPROPion (WELLBUTRIN XL) 300 MG 24 hr tablet TAKE (1) TABLET BY MOUTH ONCE DAILY. Patient taking differently: Take 300 mg by mouth daily. TAKE (1) TABLET BY MOUTH ONCE DAILY. 10/18/21   McGowen, Adrian Blackwater, MD  cyanocobalamin (,VITAMIN B-12,) 1000 MCG/ML injection Inject 1 mL (1,000 mcg total) into the muscle every 30 (thirty) days. Patient not taking: Reported on 06/17/2022 10/18/21   [name], MD  Fluticasone-Salmeterol [phone] MCG/ACT AEPB Inhale 1 Inhalation into the lungs 2 (two) times daily. 06/25/22   Roxan Hockey, MD  folic acid (FOLVITE) 1 MG tablet Take 1 tablet (1 mg total) by mouth daily. 10/18/21   McGowen, Adrian Blackwater, MD  Glycopyrrolate-Formoterol (BEVESPI AEROSPHERE) 9-4.8 MCG/ACT AERO 2 puffs bid Patient taking differently: Inhale 2 puffs into the lungs in the morning and at bedtime. 03/09/22   McGowen, Adrian Blackwater, MD  isosorbide mononitrate (IMDUR) 30 MG 24 hr tablet Take 1 tablet (30 mg total) by mouth daily. 06/25/22 06/25/23  Roxan Hockey, MD  metoprolol succinate (TOPROL-XL) 100 MG 24 hr tablet TAKE 1 AND 1/2 TABLET BY MOUTH ONCE DAILY WITH MEAL. 06/25/22  Roxan Hockey, MD  Multiple Vitamin (MULTIVITAMIN WITH MINERALS) TABS tablet Take 1 tablet by mouth daily. 06/26/22   Roxan Hockey, MD  Nebulizers (COMPRESSOR/NEBULIZER) MISC 1 unit dose of albuterol via nebulizer q4h prn wheezing and shortness of breath. 07/12/21   McGowen, Adrian Blackwater, MD  nicotine (NICODERM CQ - DOSED IN MG/24 HOURS) 14 mg/24hr patch Place 1 patch (14 mg total) onto the skin daily. 06/26/22   Roxan Hockey, MD  oxyCODONE (ROXICODONE) 5 MG immediate release tablet Take 1 tablet (5 mg total)  by mouth every 6 (six) hours as needed for severe pain. 06/25/22   Roxan Hockey, MD  pantoprazole (PROTONIX) 40 MG tablet Take 1 tablet (40 mg total) by mouth daily. 06/26/22   Roxan Hockey, MD  QUEtiapine (SEROQUEL) 200 MG tablet Take 1 tablet (200 mg total) by mouth at bedtime. 06/25/22   Roxan Hockey, MD  rivaroxaban (XARELTO) 10 MG TABS tablet Take 1 tablet (10 mg total) by mouth daily. For DVT prophylaxis for 1 month Only 06/25/22   Emokpae, Courage, MD  senna-docusate (SENOKOT-S) 8.6-50 MG tablet Take 2 tablets by mouth at bedtime. 06/25/22   Roxan Hockey, MD  thiamine (VITAMIN B1) 100 MG tablet Take 1 tablet (100 mg total) by mouth daily. 06/25/22   Roxan Hockey, MD  Vitamin D, Ergocalciferol, (DRISDOL) 1.25 MG (50000 UNIT) CAPS capsule Take 1 capsule (50,000 Units total) by mouth every 7 (seven) days. 06/25/22   Roxan Hockey, MD      Allergies    Citalopram    Review of Systems   Review of Systems  Respiratory:  Positive for shortness of breath.   All other systems reviewed and are negative.   Physical Exam Updated Vital Signs BP (!) 87/68 (BP Location: Right Arm)   Pulse 79   Temp 98.2 F (36.8 C) (Oral)   Resp 18   SpO2 95%  Physical Exam Vitals and nursing note reviewed.  Constitutional:      General: He is not in acute distress.    Appearance: He is well-developed.  HENT:     Head: Normocephalic and atraumatic.     Mouth/Throat:     Pharynx: No oropharyngeal exudate.  Eyes:     General: No scleral icterus.       Right eye: No discharge.        Left eye: No discharge.     Conjunctiva/sclera: Conjunctivae normal.     Pupils: Pupils are equal, round, and reactive to light.  Neck:     Thyroid: No thyromegaly.     Vascular: No JVD.  Cardiovascular:     Rate and Rhythm: Normal rate and regular rhythm.     Heart sounds: Normal heart sounds. No murmur heard.    No friction rub. No gallop.  Pulmonary:     Effort: No respiratory distress.      Breath sounds: Wheezing present. No rales.     Comments: Slight increased work of breathing, speaks in shortened sentences, blood pressure slightly low Abdominal:     General: Bowel sounds are normal. There is no distension.     Palpations: Abdomen is soft. There is no mass.     Tenderness: There is no abdominal tenderness.  Musculoskeletal:        General: No tenderness. Normal range of motion.     Cervical back: Normal range of motion and neck supple.     Right lower leg: No tenderness. No edema.     Left lower leg: No tenderness. Edema  present.  Lymphadenopathy:     Cervical: No cervical adenopathy.  Skin:    General: Skin is warm and dry.     Findings: No erythema or rash.  Neurological:     Mental Status: He is alert.     Coordination: Coordination normal.  Psychiatric:        Behavior: Behavior normal.     ED Results / Procedures / Treatments   Labs (all labs ordered are listed, but only abnormal results are displayed) Labs Reviewed  [place] PANEL    EKG None  Radiology No results found.  Procedures Procedures  {Document cardiac monitor, telemetry assessment procedure when appropriate:1}  Medications Ordered in ED Medications  methylPREDNISolone sodium succinate (SOLU-MEDROL) 125 mg/2 mL injection 125 mg (has no administration in time range)  albuterol (PROVENTIL,VENTOLIN) solution continuous neb (has no administration in time range)  ipratropium-albuterol (DUONEB) 0.5-2.5 (3) MG/3ML nebulizer solution 3 mL (has no administration in time range)    ED Course/ Medical Decision Making/ A&P                           Medical Decision Making Amount and/or Complexity of Data Reviewed Labs: ordered. Radiology: ordered.  Risk Prescription drug management.   This patient presents to the ED for concern of shortness of breath and wheezing, this involves an extensive number of treatment options, and is a complaint that  carries with it a high risk of complications and morbidity.  The differential diagnosis includes potential COPD exacerbation, consider congestive heart failure given the edema of the leg, this could also be postoperative edema, this could also be a DVT given that he recently had surgery and is currently homeless and maybe not be taking the medications   Co morbidities that complicate the patient evaluation  COPD   Additional history obtained:  Additional history obtained from electronic medical record  External records from outside source obtained and reviewed including surgical notes, discharge summary   Lab Tests:  I Ordered, and personally interpreted labs.  The pertinent results include:  ***   Imaging Studies ordered:  I ordered imaging studies including ***  I independently visualized and interpreted imaging which showed *** I agree with the radiologist interpretation   Cardiac Monitoring: / EKG:  The patient was maintained on a cardiac monitor.  I personally viewed and interpreted the cardiac monitored which showed an underlying rhythm of: Normal sinus rhythm   Consultations Obtained:  I requested consultation with the ***,  and discussed lab and imaging findings as well as pertinent plan - they recommend: ***   Problem List / ED Course / Critical interventions / Medication management  *** I ordered medication including ***  for ***  Reevaluation of the patient after these medicines showed that the patient {resolved/improved/worsened:23923::"improved"} I have reviewed the patients home medicines and have made adjustments as needed   Social Determinants of Health:  ***   Test / Admission - Considered:  ***   {Document critical care time when appropriate:1} {Document review of labs and clinical decision tools ie heart score, Chads2Vasc2 etc:1}  {Document your independent review of radiology images, and any outside records:1} {Document your discussion with  family members, caretakers, and with consultants:1} {Document social determinants of health affecting pt's care:1} {Document your decision making why or why not admission, treatments were needed:1} Final Clinical Impression(s) / ED Diagnoses Final diagnoses:  None    Rx / DC  Orders ED Discharge Orders     None

## 2022-07-13 NOTE — ED Triage Notes (Addendum)
Pt called 911 for breathing problems. He reports he is renting a room from some people there and they want him out. States he had hip surgery a few weeks ago. CHF and COPD. Been smoking all day. Some dyspnea upon talking. Sats were 97% on RA. BP soft 29'H systolic

## 2022-07-13 NOTE — Progress Notes (Addendum)
Went in to check on patient and how he was tolerating breathing treatment.  Patient pulled off mask and stated that he was done, that it was making his SOB worse.  I responded that it should be making it some better, but patient seemed more concerned that he had to provide urine sample before getting something to drink. Patient did however, almost finished treatment before taking it off.

## 2022-07-14 ENCOUNTER — Encounter (HOSPITAL_COMMUNITY): Payer: Self-pay

## 2022-07-14 ENCOUNTER — Emergency Department (HOSPITAL_COMMUNITY): Payer: Medicare Other

## 2022-07-14 ENCOUNTER — Other Ambulatory Visit: Payer: Self-pay

## 2022-07-14 ENCOUNTER — Emergency Department (HOSPITAL_COMMUNITY)
Admission: EM | Admit: 2022-07-14 | Discharge: 2022-07-15 | Disposition: A | Discharge status: Home / Self Care | Payer: Medicare Other | Source: Home / Self Care | Attending: Emergency Medicine | Admitting: Emergency Medicine

## 2022-07-14 DIAGNOSIS — J449 Chronic obstructive pulmonary disease, unspecified: Secondary | ICD-10-CM | POA: Insufficient documentation

## 2022-07-14 DIAGNOSIS — Z1152 Encounter for screening for COVID-19: Secondary | ICD-10-CM | POA: Diagnosis not present

## 2022-07-14 DIAGNOSIS — G8918 Other acute postprocedural pain: Secondary | ICD-10-CM

## 2022-07-14 DIAGNOSIS — Z720 Tobacco use: Secondary | ICD-10-CM | POA: Diagnosis not present

## 2022-07-14 DIAGNOSIS — Z5900 Homelessness unspecified: Secondary | ICD-10-CM

## 2022-07-14 DIAGNOSIS — R0602 Shortness of breath: Secondary | ICD-10-CM | POA: Insufficient documentation

## 2022-07-14 DIAGNOSIS — Z7901 Long term (current) use of anticoagulants: Secondary | ICD-10-CM | POA: Insufficient documentation

## 2022-07-14 DIAGNOSIS — J441 Chronic obstructive pulmonary disease with (acute) exacerbation: Secondary | ICD-10-CM | POA: Diagnosis not present

## 2022-07-14 DIAGNOSIS — M79606 Pain in leg, unspecified: Secondary | ICD-10-CM | POA: Insufficient documentation

## 2022-07-14 DIAGNOSIS — F17210 Nicotine dependence, cigarettes, uncomplicated: Secondary | ICD-10-CM | POA: Diagnosis not present

## 2022-07-14 DIAGNOSIS — Z8781 Personal history of (healed) traumatic fracture: Secondary | ICD-10-CM | POA: Diagnosis not present

## 2022-07-14 DIAGNOSIS — Z7982 Long term (current) use of aspirin: Secondary | ICD-10-CM | POA: Insufficient documentation

## 2022-07-14 DIAGNOSIS — R9431 Abnormal electrocardiogram [ECG] [EKG]: Secondary | ICD-10-CM | POA: Diagnosis not present

## 2022-07-14 DIAGNOSIS — Z20822 Contact with and (suspected) exposure to covid-19: Secondary | ICD-10-CM | POA: Diagnosis not present

## 2022-07-14 DIAGNOSIS — R569 Unspecified convulsions: Secondary | ICD-10-CM | POA: Diagnosis not present

## 2022-07-14 DIAGNOSIS — I4891 Unspecified atrial fibrillation: Secondary | ICD-10-CM | POA: Diagnosis not present

## 2022-07-14 DIAGNOSIS — Z7952 Long term (current) use of systemic steroids: Secondary | ICD-10-CM | POA: Diagnosis not present

## 2022-07-14 DIAGNOSIS — Z8673 Personal history of transient ischemic attack (TIA), and cerebral infarction without residual deficits: Secondary | ICD-10-CM | POA: Diagnosis not present

## 2022-07-14 DIAGNOSIS — I10 Essential (primary) hypertension: Secondary | ICD-10-CM | POA: Diagnosis not present

## 2022-07-14 DIAGNOSIS — R404 Transient alteration of awareness: Secondary | ICD-10-CM | POA: Diagnosis not present

## 2022-07-14 DIAGNOSIS — J9811 Atelectasis: Secondary | ICD-10-CM | POA: Diagnosis not present

## 2022-07-14 DIAGNOSIS — R110 Nausea: Secondary | ICD-10-CM | POA: Diagnosis not present

## 2022-07-14 DIAGNOSIS — R6889 Other general symptoms and signs: Secondary | ICD-10-CM | POA: Diagnosis not present

## 2022-07-14 DIAGNOSIS — R0902 Hypoxemia: Secondary | ICD-10-CM | POA: Diagnosis not present

## 2022-07-14 DIAGNOSIS — R55 Syncope and collapse: Secondary | ICD-10-CM | POA: Diagnosis not present

## 2022-07-14 DIAGNOSIS — J439 Emphysema, unspecified: Secondary | ICD-10-CM | POA: Diagnosis not present

## 2022-07-14 DIAGNOSIS — Z79899 Other long term (current) drug therapy: Secondary | ICD-10-CM | POA: Insufficient documentation

## 2022-07-14 DIAGNOSIS — Z743 Need for continuous supervision: Secondary | ICD-10-CM | POA: Diagnosis not present

## 2022-07-14 DIAGNOSIS — M79605 Pain in left leg: Secondary | ICD-10-CM | POA: Diagnosis not present

## 2022-07-14 LAB — URINALYSIS, ROUTINE W REFLEX MICROSCOPIC
Bilirubin Urine: NEGATIVE
Glucose, UA: NEGATIVE mg/dL
Hgb urine dipstick: NEGATIVE
Ketones, ur: NEGATIVE mg/dL
Leukocytes,Ua: NEGATIVE
Nitrite: NEGATIVE
Protein, ur: NEGATIVE mg/dL
Specific Gravity, Urine: 1.008 (ref 1.005–1.030)
pH: 7 (ref 5.0–8.0)

## 2022-07-14 LAB — LACTIC ACID, PLASMA: Lactic Acid, Venous: 2 mmol/L (ref 0.5–1.9)

## 2022-07-14 MED ORDER — ALBUTEROL SULFATE HFA 108 (90 BASE) MCG/ACT IN AERS
2.0000 | INHALATION_SPRAY | Freq: Once | RESPIRATORY_TRACT | Status: AC
  Administered 2022-07-14: 2 via RESPIRATORY_TRACT
  Filled 2022-07-14: qty 6.7

## 2022-07-14 MED ORDER — HYDROCODONE-ACETAMINOPHEN 5-325 MG PO TABS
1.0000 | ORAL_TABLET | Freq: Once | ORAL | Status: AC
  Administered 2022-07-15: 1 via ORAL
  Filled 2022-07-14: qty 1

## 2022-07-14 MED ORDER — HYDROCODONE-ACETAMINOPHEN 5-325 MG PO TABS
1.0000 | ORAL_TABLET | Freq: Once | ORAL | Status: AC
  Administered 2022-07-14: 1 via ORAL
  Filled 2022-07-14: qty 1

## 2022-07-14 NOTE — ED Triage Notes (Signed)
Pt here for c/o L hip and L leg pain. Pt just seen at [facility] for same. Pt is homeless.

## 2022-07-14 NOTE — ED Notes (Signed)
Pt is refusing to leave and says that he cannot walk, has no walker or wheelchair, and has a blood clot no matter what [place] says. He insists on seeing MD before he will consider leaving.   Of note, pt ambulated to and from restroom w/o assistance immediately following this discussion.

## 2022-07-14 NOTE — ED Notes (Signed)
Pt now says he will consider leaving if we can provide him with a cab voucher to get to a homeless shelter in [place]. Charge nurse notified.

## 2022-07-14 NOTE — ED Provider Notes (Signed)
Dent of the patient assumed at the change of shift. Here for SOB and left leg swelling. Recently admitted for hip fracture, discharged to rehab but signed out and now is homeless. He has had nebs and steroids, breathing improved. Will continue to monitor, likely will need to stay in the ED until AM for doppler to rule out DVT as he has not been compliant with his post-op medications.   6:06 AM Patient has been resting overnight, given one dose of pain medication. Doppler ordered for this morning, care will be signed out to the oncoming team at the change of shift.    Truddie Hidden, MD 07/14/22 [phone]

## 2022-07-14 NOTE — ED Provider Notes (Signed)
71 yo male here pending DVT ultrasound Hx of left hip fracture 1 month ago  Treated for COPD exacerbation   Physical Exam  BP (!) 116/92   Pulse 93   Temp 98.2 F (36.8 C) (Oral)   Resp 18   SpO2 91%   Physical Exam  Procedures  Procedures  ED Course / MDM    Medical Decision Making Amount and/or Complexity of Data Reviewed Labs: ordered. Radiology: ordered.  Risk Prescription drug management.   DVT ultrasound personally viewed interpreted, no significant findings.  Patient was provided with albuterol pump to take with him.  Homeless shelter resource information provided.  Okay for discharge       Wyvonnia Dusky, MD 07/14/22 [phone]

## 2022-07-15 ENCOUNTER — Other Ambulatory Visit: Payer: Self-pay

## 2022-07-15 ENCOUNTER — Encounter (HOSPITAL_COMMUNITY): Payer: Self-pay | Admitting: Emergency Medicine

## 2022-07-15 ENCOUNTER — Emergency Department (HOSPITAL_COMMUNITY)
Admission: EM | Admit: 2022-07-15 | Discharge: 2022-07-15 | Disposition: A | Discharge status: Home / Self Care | Payer: Medicare Other | Attending: Emergency Medicine | Admitting: Emergency Medicine

## 2022-07-15 DIAGNOSIS — Z79899 Other long term (current) drug therapy: Secondary | ICD-10-CM | POA: Insufficient documentation

## 2022-07-15 DIAGNOSIS — R531 Weakness: Secondary | ICD-10-CM | POA: Insufficient documentation

## 2022-07-15 DIAGNOSIS — R0602 Shortness of breath: Secondary | ICD-10-CM | POA: Diagnosis not present

## 2022-07-15 DIAGNOSIS — J449 Chronic obstructive pulmonary disease, unspecified: Secondary | ICD-10-CM | POA: Insufficient documentation

## 2022-07-15 DIAGNOSIS — Z7982 Long term (current) use of aspirin: Secondary | ICD-10-CM | POA: Insufficient documentation

## 2022-07-15 LAB — RAPID URINE DRUG SCREEN, HOSP PERFORMED
Amphetamines: NOT DETECTED
Barbiturates: NOT DETECTED
Benzodiazepines: NOT DETECTED
Cocaine: NOT DETECTED
Opiates: POSITIVE — AB
Tetrahydrocannabinol: NOT DETECTED

## 2022-07-15 LAB — URINE CULTURE: Culture: <10000 — AB

## 2022-07-15 MED ORDER — IPRATROPIUM-ALBUTEROL 0.5-2.5 (3) MG/3ML IN SOLN
3.0000 mL | Freq: Once | RESPIRATORY_TRACT | Status: AC
  Administered 2022-07-15: 3 mL via RESPIRATORY_TRACT
  Filled 2022-07-15: qty 3

## 2022-07-15 NOTE — ED Provider Notes (Cosign Needed Addendum)
[facility] Provider Note   CSN: [id number] Arrival date & time: 07/15/22  1211     History  Chief Complaint  Patient presents with   Shortness of Breath    Jacob Frank is a 71 y.o. male.  Patient complains of not being able to walk.  Patient reports that he was seen here yesterday.  He reports he has been unable to leave he does not have anywhere to go and he is not able to ambulate.  Patient complains of feeling short of breath he has a history of COPD  The history is provided by the patient. No language interpreter was used.  Shortness of Breath Severity:  Moderate Onset quality:  Gradual Duration:  2 days Timing:  Constant Progression:  Worsening Chronicity:  Recurrent      Home Medications Prior to Admission medications   Medication Sig Start Date End Date Taking? Authorizing Provider  albuterol (PROAIR HFA) 108 (90 Base) MCG/ACT inhaler 2 PUFFS EVERY FOUR HOURS AS NEEDED FOR WHEEZING Patient taking differently: Inhale 2 puffs into the lungs every 6 (six) hours as needed for wheezing or shortness of breath. 06/25/22  Yes Emokpae, Courage, MD  albuterol (PROVENTIL) (2.5 MG/3ML) 0.083% nebulizer solution Take 3 mLs (2.5 mg total) by nebulization every 6 (six) hours as needed for shortness of breath. INHALE 1 VIAL VIA NEBULIZER EVERY 6 HOURS AS NEEDED FOR WHEEZING OR SHORTNESS OF BREATH. Patient taking differently: Take 2.5 mg by nebulization every 6 (six) hours as needed for shortness of breath or wheezing. 06/25/22  Yes Emokpae, Courage, MD  ALPRAZolam (XANAX) 0.5 MG tablet Take 1 tablet (0.5 mg total) by mouth 2 (two) times daily as needed for sleep or anxiety. 06/25/22 06/25/23 Yes Emokpae, Courage, MD  amLODipine (NORVASC) 10 MG tablet Take 1 tablet (10 mg total) by mouth daily. 06/26/22  Yes Roxan Hockey, MD  Fluticasone-Salmeterol 232-14 MCG/ACT AEPB Inhale 1 Inhalation into the lungs 2 (two) times daily. 06/25/22  Yes Emokpae, Courage, MD   isosorbide mononitrate (IMDUR) 30 MG 24 hr tablet Take 1 tablet (30 mg total) by mouth daily. 06/25/22 06/25/23 Yes Emokpae, Courage, MD  methocarbamol (ROBAXIN) 500 MG tablet Take 500 mg by mouth 2 (two) times daily as needed for muscle spasms.   Yes [provider]  metoprolol succinate (TOPROL-XL) 100 MG 24 hr tablet TAKE 1 AND 1/2 TABLET BY MOUTH ONCE DAILY WITH MEAL. Patient taking differently: Take 150 mg by mouth daily. 06/25/22  Yes Emokpae, Courage, MD  Multiple Vitamin (MULTIVITAMIN WITH MINERALS) TABS tablet Take 1 tablet by mouth daily. 06/26/22  Yes Emokpae, Courage, MD  pantoprazole (PROTONIX) 40 MG tablet Take 1 tablet (40 mg total) by mouth daily. 06/26/22  Yes Emokpae, Courage, MD  QUEtiapine (SEROQUEL) 200 MG tablet Take 1 tablet (200 mg total) by mouth at bedtime. 06/25/22  Yes Emokpae, Courage, MD  rivaroxaban (XARELTO) 10 MG TABS tablet Take 1 tablet (10 mg total) by mouth daily. For DVT prophylaxis for 1 month Only 06/25/22  Yes Emokpae, Courage, MD  senna-docusate (SENOKOT-S) 8.6-50 MG tablet Take 2 tablets by mouth at bedtime. Patient taking differently: Take 1-2 tablets by mouth at bedtime as needed for mild constipation. 06/25/22  Yes Emokpae, Courage, MD  thiamine (VITAMIN B1) 100 MG tablet Take 1 tablet (100 mg total) by mouth daily. 06/25/22  Yes Roxan Hockey, MD  aspirin EC 81 MG tablet Take 1 tablet (81 mg total) by mouth daily with breakfast. Swallow whole. Patient not taking:  Reported on 07/15/2022 06/25/22   Roxan Hockey, MD  buPROPion (WELLBUTRIN XL) 300 MG 24 hr tablet TAKE (1) TABLET BY MOUTH ONCE DAILY. Patient not taking: Reported on 07/14/2022 10/18/21   McGowen, Adrian Blackwater, MD  cyanocobalamin (,VITAMIN B-12,) 1000 MCG/ML injection Inject 1 mL (1,000 mcg total) into the muscle every 30 (thirty) days. Patient not taking: Reported on 06/17/2022 10/18/21   [name], MD  Glycopyrrolate-Formoterol (BEVESPI AEROSPHERE) 9-4.8 MCG/ACT AERO 2 puffs  bid Patient not taking: Reported on 07/14/2022 03/09/22   [name], MD  Nebulizers (COMPRESSOR/NEBULIZER) MISC 1 unit dose of albuterol via nebulizer q4h prn wheezing and shortness of breath. 07/12/21   McGowen, Adrian Blackwater, MD  nicotine (NICODERM CQ - DOSED IN MG/24 HOURS) 14 mg/24hr patch Place 1 patch (14 mg total) onto the skin daily. Patient not taking: Reported on 07/15/2022 06/26/22   Roxan Hockey, MD  oxyCODONE (ROXICODONE) 5 MG immediate release tablet Take 1 tablet (5 mg total) by mouth every 6 (six) hours as needed for severe pain. Patient not taking: Reported on 07/14/2022 06/25/22   Roxan Hockey, MD  Vitamin D, Ergocalciferol, (DRISDOL) 1.25 MG (50000 UNIT) CAPS capsule Take 1 capsule (50,000 Units total) by mouth every 7 (seven) days. Patient not taking: Reported on 07/14/2022 06/25/22   Roxan Hockey, MD      Allergies    Citalopram    Review of Systems   Review of Systems  Respiratory:  Positive for shortness of breath.   All other systems reviewed and are negative.   Physical Exam Updated Vital Signs BP 111/79   Pulse 78   Temp 98.2 F (36.8 C) (Oral)   Resp 12   Ht '5\' 9"'$  (1.753 m)   Wt 77.1 kg   SpO2 96%   BMI 25.10 kg/m  Physical Exam Vitals and nursing note reviewed.  Constitutional:      Appearance: He is well-developed.  HENT:     Head: Normocephalic.  Cardiovascular:     Rate and Rhythm: Normal rate and regular rhythm.  Pulmonary:     Effort: Pulmonary effort is normal.     Breath sounds: Rhonchi present.  Abdominal:     General: There is no distension.  Musculoskeletal:     Cervical back: Normal range of motion.  Skin:    General: Skin is warm.  Neurological:     General: No focal deficit present.     Mental Status: He is alert and oriented to person, place, and time.  Psychiatric:        Mood and Affect: Mood normal.     ED Results / Procedures / Treatments   Labs (all labs ordered are listed, but only abnormal results  are displayed) Labs Reviewed - No data to display  EKG EKG Interpretation  Date/Time:  Sunday July 15 2022 13:09:58 EDT Ventricular Rate:  83 PR Interval:  164 QRS Duration: 69 QT Interval:  387 QTC Calculation: 455 R Axis:   72 Text Interpretation: Sinus rhythm Borderline low voltage, extremity leads Confirmed by Octaviano Glow [phone]) on 07/15/2022 2:40:48 PM  Radiology US Venous Img Lower  Left (DVT Study)  Result Date: 07/14/2022 CLINICAL DATA:  Left hip ORIF in September. Left lower extremity pain and edema. EXAM: LEFT LOWER EXTREMITY VENOUS DOPPLER ULTRASOUND TECHNIQUE: Gray-scale sonography with graded compression, as well as color Doppler and duplex ultrasound were performed to evaluate the lower extremity deep venous systems from the level of the common femoral vein and including the common  femoral, femoral, profunda femoral, popliteal and calf veins including the posterior tibial, peroneal and gastrocnemius veins when visible. The superficial great saphenous vein was also interrogated. Spectral Doppler was utilized to evaluate flow at rest and with distal augmentation maneuvers in the common femoral, femoral and popliteal veins. COMPARISON:  None Available. FINDINGS: Contralateral Common Femoral Vein: Respiratory phasicity is normal and symmetric with the symptomatic side. No evidence of thrombus. Normal compressibility. Common Femoral Vein: No evidence of thrombus. Normal compressibility, respiratory phasicity and response to augmentation. Saphenofemoral Junction: No evidence of thrombus. Normal compressibility and flow on color Doppler imaging. Profunda Femoral Vein: No evidence of thrombus. Normal compressibility and flow on color Doppler imaging. Femoral Vein: No evidence of thrombus. Normal compressibility, respiratory phasicity and response to augmentation. Popliteal Vein: No evidence of thrombus. Normal compressibility, respiratory phasicity and response to augmentation. Calf  Veins: No evidence of thrombus. Normal compressibility and flow on color Doppler imaging. Superficial Great Saphenous Vein: No evidence of thrombus. Normal compressibility. Venous Reflux:  None. Other Findings: No evidence of superficial thrombophlebitis or abnormal fluid collection. IMPRESSION: No evidence of left lower extremity deep venous thrombosis. Electronically Signed   By: Aletta Edouard M.D.   On: 07/14/2022 09:53   DG Chest Portable 1 View  Result Date: 07/13/2022 CLINICAL DATA:  Shortness of breath. EXAM: PORTABLE CHEST 1 VIEW COMPARISON:  Radiograph 06/21/2022 FINDINGS: The lungs are hyperinflated with bronchial thickening. Despite 2 acquisitions the right costophrenic angle is excluded from the field of view. The patient is rotated. The previous bibasilar airspace opacities have resolved. Heart is grossly stable in size allowing for rotation. No definite new airspace disease. No pneumothorax. No large pleural effusion. IMPRESSION: 1. Hyperinflation and bronchial thickening, consistent COPD. 2. Previous bibasilar airspace opacities have resolved. No definite new airspace disease. Electronically Signed   By: Keith Rake M.D.   On: 07/13/2022 22:11    Procedures Procedures    Medications Ordered in ED Medications  ipratropium-albuterol (DUONEB) 0.5-2.5 (3) MG/3ML nebulizer solution 3 mL (3 mLs Nebulization Given 07/15/22 1434)    ED Course/ Medical Decision Making/ A&P                           Medical Decision Making Is a 71 year old gentleman who had surgery on his hip September 19 by Dr. Aline Brochure.  Patient was discharged to a rehab facility.  Patient signed out of rehab after 2 weeks.  Patient states he wants to return to the rehab facility.  Patient reports continued difficulty being able to ambulate.  He was seen here yesterday he was seen here 2 days ago and in between has been seen at [facility].  Patient reports he has family and stroke still however due to his  history of substance abuse they do not interact with him.  Patient reports he was living in a boardinghouse but decided to move out.  Patient reports he was staying with a friend ever he reports his friend uses too many drugs.  Amount and/or Complexity of Data Reviewed Labs: ordered.  Risk Prescription drug management. Risk Details: Patient states he has not ate anything in over 24 hours so we will provide him with a meal I will try to obtain a walker to help patient with ambulation.  I will consult transitions of care to assist with any resource they might have. Dr. Sabra Heck in to see and examine.  Pt able to ambulate with a walker  Final Clinical Impression(s) / ED Diagnoses Final diagnoses:  Weakness    Rx / DC Orders ED Discharge Orders     None      An After Visit Summary was printed and given to the patient.    Fransico Meadow, [place] 07/15/22 1636    Wyvonnia Dusky, MD 07/15/22 1647    Fransico Meadow, PA-C 07/15/22 1738    Wyvonnia Dusky, MD 07/16/22 [phone]

## 2022-07-15 NOTE — ED Notes (Signed)
NT reported that pt refused to ambulate, edp was made aware.    Pt also requesting pain med for his leg pain, edp made aware as well.

## 2022-07-15 NOTE — ED Notes (Signed)
Gave pain  pill and went over dc papers. Pt is upset that he is being dc again. Educated he was seen multiple times and needs to go follow up with surgeon and pcp. Wheeled out to lobby.

## 2022-07-15 NOTE — ED Provider Notes (Incomplete)
[facility] EMERGENCY DEPARTMENT  Provider Note  CSN: [id number] Arrival date & time: 07/14/22 2324  History Chief Complaint  Patient presents with  . Leg Pain    Jacob Frank is a 71 y.o. male with history of EtOH abuse, afib, COPD recently admitted for hip fracture, discharged to SNF, but signed out AMA. He was in this ED last night for SOB and leg pain/swelling. Had nebs, steroids and held overnight for doppler this morning which was negative. He was discharged to homeless shelter where apparently he had a syncopal episode earlier this evening, taken to [facility] ED where he had a comprehensive workup including CTA chest which was negative. He states he was told he 'needed to come back here to see his doctors' so the police brought him here 'as a courtesy'. His only complaint at his time is leg pain. States he is interested in trying to get back into rehab.    Home Medications Prior to Admission medications   Medication Sig Start Date End Date Taking? Authorizing Provider  albuterol (PROAIR HFA) 108 (90 Base) MCG/ACT inhaler 2 PUFFS EVERY FOUR HOURS AS NEEDED FOR WHEEZING Patient taking differently: Inhale 2 puffs into the lungs every 6 (six) hours as needed for wheezing or shortness of breath. 06/25/22   Roxan Hockey, MD  albuterol (PROVENTIL) (2.5 MG/3ML) 0.083% nebulizer solution Take 3 mLs (2.5 mg total) by nebulization every 6 (six) hours as needed for shortness of breath. INHALE 1 VIAL VIA NEBULIZER EVERY 6 HOURS AS NEEDED FOR WHEEZING OR SHORTNESS OF BREATH. Patient taking differently: Take 2.5 mg by nebulization every 6 (six) hours as needed for shortness of breath or wheezing. 06/25/22   Roxan Hockey, MD  ALPRAZolam Duanne Moron) 0.5 MG tablet Take 1 tablet (0.5 mg total) by mouth 2 (two) times daily as needed for sleep or anxiety. 06/25/22 06/25/23  Roxan Hockey, MD  amLODipine (NORVASC) 10 MG tablet Take 1 tablet (10 mg total) by mouth daily. 06/26/22   Roxan Hockey, MD   aspirin EC 81 MG tablet Take 1 tablet (81 mg total) by mouth daily with breakfast. Swallow whole. 06/25/22   Emokpae, Courage, MD  buPROPion (WELLBUTRIN XL) 300 MG 24 hr tablet TAKE (1) TABLET BY MOUTH ONCE DAILY. Patient not taking: Reported on 07/14/2022 10/18/21   McGowen, Adrian Blackwater, MD  cyanocobalamin (,VITAMIN B-12,) 1000 MCG/ML injection Inject 1 mL (1,000 mcg total) into the muscle every 30 (thirty) days. Patient not taking: Reported on 06/17/2022 10/18/21   [name], MD  Fluticasone-Salmeterol [phone] MCG/ACT AEPB Inhale 1 Inhalation into the lungs 2 (two) times daily. 06/25/22   Roxan Hockey, MD  Glycopyrrolate-Formoterol (BEVESPI AEROSPHERE) 9-4.8 MCG/ACT AERO 2 puffs bid Patient not taking: Reported on 07/14/2022 03/09/22   [name], MD  isosorbide mononitrate (IMDUR) 30 MG 24 hr tablet Take 1 tablet (30 mg total) by mouth daily. 06/25/22 06/25/23  Roxan Hockey, MD  methocarbamol (ROBAXIN) 500 MG tablet Take 500 mg by mouth 2 (two) times daily as needed for muscle spasms.    [provider]  metoprolol succinate (TOPROL-XL) 100 MG 24 hr tablet TAKE 1 AND 1/2 TABLET BY MOUTH ONCE DAILY WITH MEAL. Patient taking differently: Take 150 mg by mouth daily. 06/25/22   Roxan Hockey, MD  Multiple Vitamin (MULTIVITAMIN WITH MINERALS) TABS tablet Take 1 tablet by mouth daily. 06/26/22   Roxan Hockey, MD  Nebulizers (COMPRESSOR/NEBULIZER) MISC 1 unit dose of albuterol via nebulizer q4h prn wheezing and shortness of breath. 07/12/21  McGowen, Adrian Blackwater, MD  nicotine (NICODERM CQ - DOSED IN MG/24 HOURS) 14 mg/24hr patch Place 1 patch (14 mg total) onto the skin daily. 06/26/22   Roxan Hockey, MD  oxyCODONE (ROXICODONE) 5 MG immediate release tablet Take 1 tablet (5 mg total) by mouth every 6 (six) hours as needed for severe pain. Patient not taking: Reported on 07/14/2022 06/25/22   Roxan Hockey, MD  pantoprazole (PROTONIX) 40 MG tablet Take 1 tablet (40 mg total)  by mouth daily. 06/26/22   Roxan Hockey, MD  QUEtiapine (SEROQUEL) 200 MG tablet Take 1 tablet (200 mg total) by mouth at bedtime. 06/25/22   Roxan Hockey, MD  rivaroxaban (XARELTO) 10 MG TABS tablet Take 1 tablet (10 mg total) by mouth daily. For DVT prophylaxis for 1 month Only 06/25/22   Emokpae, Courage, MD  senna-docusate (SENOKOT-S) 8.6-50 MG tablet Take 2 tablets by mouth at bedtime. Patient taking differently: Take 1-2 tablets by mouth at bedtime as needed for mild constipation. 06/25/22   Roxan Hockey, MD  thiamine (VITAMIN B1) 100 MG tablet Take 1 tablet (100 mg total) by mouth daily. 06/25/22   Roxan Hockey, MD  Vitamin D, Ergocalciferol, (DRISDOL) 1.25 MG (50000 UNIT) CAPS capsule Take 1 capsule (50,000 Units total) by mouth every 7 (seven) days. Patient not taking: Reported on 07/14/2022 06/25/22   Roxan Hockey, MD     Allergies    Citalopram   Review of Systems   Review of Systems Please see HPI for pertinent positives and negatives  Physical Exam BP 110/74 (BP Location: Right Arm)   Pulse 89   Temp 98 F (36.7 C) (Oral)   Resp 20   Ht '5\' 5"'$  (1.651 m)   Wt 78 kg   SpO2 94%   BMI 28.62 kg/m   Physical Exam Vitals and nursing note reviewed.  HENT:     Head: Normocephalic.     Nose: Nose normal.  Eyes:     Extraocular Movements: Extraocular movements intact.  Pulmonary:     Effort: Pulmonary effort is normal.  Musculoskeletal:        General: Normal range of motion.     Cervical back: Neck supple.  Skin:    Findings: No rash (on exposed skin).  Neurological:     Mental Status: He is alert and oriented to person, place, and time.  Psychiatric:        Mood and Affect: Mood normal.     ED Results / Procedures / Treatments   EKG None  Procedures Procedures  Medications Ordered in the ED Medications  HYDROcodone-acetaminophen (NORCO/VICODIN) 5-325 MG per tablet 1 tablet (has no administration in time range)    Initial Impression and  Plan  Patient here for re-evaluation of post-op leg pain. Advised there is no indication for further ED workup or admission and we do not have the ability to place him into SNF at this time of night on a weekend. He will be given a dose of Norco, advised to return to homeless shelter, follow up with his PCP to discuss SNF placement and with Ortho for long term post-op management.   ED Course       MDM Rules/Calculators/A&P Medical Decision Making Risk Prescription drug management.    Final Clinical Impression(s) / ED Diagnoses Final diagnoses:  Post-op pain  Homelessness    Rx / DC Orders ED Discharge Orders     None

## 2022-07-15 NOTE — ED Notes (Signed)
ED Provider at bedside. 

## 2022-07-15 NOTE — ED Notes (Signed)
Pt refuses to ambulate, offered use with walker as well. Pt states "I'll do it tomorrow morning" . Pt still refuses to ambulate at time. EDP made aware

## 2022-07-15 NOTE — ED Triage Notes (Signed)
Patient c/o shortness of breath with some chest tightness. Per patient has COPD and is supposed to be wearing oxygen via [place] but unable to get access due to finical situation. Patient also states some nausea without vomiting. Per patient productive cough with thick white sputum. Denies any fevers. Per patient homeless.

## 2022-07-15 NOTE — ED Provider Notes (Signed)
[facility] EMERGENCY DEPARTMENT  Provider Note  CSN: [id number] Arrival date & time: 07/14/22 2324  History Chief Complaint  Patient presents with   Leg Pain    Jacob Frank is a 71 y.o. male with history of EtOH abuse, afib, COPD recently admitted for hip fracture, discharged to SNF, but signed out AMA. He was in this ED last night for SOB and leg pain/swelling. Had nebs, steroids and held overnight for doppler this morning which was negative. He was discharged to homeless shelter where apparently he had a syncopal episode earlier this evening, taken to [facility] ED where he had a comprehensive workup including CTA chest which was negative. He states he was told he 'needed to come back here to see his doctors' so the police brought him here 'as a courtesy'. His only complaint at his time is leg pain. States he is interested in trying to get back into rehab.    Home Medications Prior to Admission medications   Medication Sig Start Date End Date Taking? Authorizing Provider  albuterol (PROAIR HFA) 108 (90 Base) MCG/ACT inhaler 2 PUFFS EVERY FOUR HOURS AS NEEDED FOR WHEEZING Patient taking differently: Inhale 2 puffs into the lungs every 6 (six) hours as needed for wheezing or shortness of breath. 06/25/22   Roxan Hockey, MD  albuterol (PROVENTIL) (2.5 MG/3ML) 0.083% nebulizer solution Take 3 mLs (2.5 mg total) by nebulization every 6 (six) hours as needed for shortness of breath. INHALE 1 VIAL VIA NEBULIZER EVERY 6 HOURS AS NEEDED FOR WHEEZING OR SHORTNESS OF BREATH. Patient taking differently: Take 2.5 mg by nebulization every 6 (six) hours as needed for shortness of breath or wheezing. 06/25/22   Roxan Hockey, MD  ALPRAZolam Duanne Moron) 0.5 MG tablet Take 1 tablet (0.5 mg total) by mouth 2 (two) times daily as needed for sleep or anxiety. 06/25/22 06/25/23  Roxan Hockey, MD  amLODipine (NORVASC) 10 MG tablet Take 1 tablet (10 mg total) by mouth daily. 06/26/22   Roxan Hockey, MD   aspirin EC 81 MG tablet Take 1 tablet (81 mg total) by mouth daily with breakfast. Swallow whole. 06/25/22   Emokpae, Courage, MD  buPROPion (WELLBUTRIN XL) 300 MG 24 hr tablet TAKE (1) TABLET BY MOUTH ONCE DAILY. Patient not taking: Reported on 07/14/2022 10/18/21   McGowen, Adrian Blackwater, MD  cyanocobalamin (,VITAMIN B-12,) 1000 MCG/ML injection Inject 1 mL (1,000 mcg total) into the muscle every 30 (thirty) days. Patient not taking: Reported on 06/17/2022 10/18/21   [name], MD  Fluticasone-Salmeterol [phone] MCG/ACT AEPB Inhale 1 Inhalation into the lungs 2 (two) times daily. 06/25/22   Roxan Hockey, MD  Glycopyrrolate-Formoterol (BEVESPI AEROSPHERE) 9-4.8 MCG/ACT AERO 2 puffs bid Patient not taking: Reported on 07/14/2022 03/09/22   [name], MD  isosorbide mononitrate (IMDUR) 30 MG 24 hr tablet Take 1 tablet (30 mg total) by mouth daily. 06/25/22 06/25/23  Roxan Hockey, MD  methocarbamol (ROBAXIN) 500 MG tablet Take 500 mg by mouth 2 (two) times daily as needed for muscle spasms.    [provider]  metoprolol succinate (TOPROL-XL) 100 MG 24 hr tablet TAKE 1 AND 1/2 TABLET BY MOUTH ONCE DAILY WITH MEAL. Patient taking differently: Take 150 mg by mouth daily. 06/25/22   Roxan Hockey, MD  Multiple Vitamin (MULTIVITAMIN WITH MINERALS) TABS tablet Take 1 tablet by mouth daily. 06/26/22   Roxan Hockey, MD  Nebulizers (COMPRESSOR/NEBULIZER) MISC 1 unit dose of albuterol via nebulizer q4h prn wheezing and shortness of breath. 07/12/21  McGowen, Adrian Blackwater, MD  nicotine (NICODERM CQ - DOSED IN MG/24 HOURS) 14 mg/24hr patch Place 1 patch (14 mg total) onto the skin daily. 06/26/22   Roxan Hockey, MD  oxyCODONE (ROXICODONE) 5 MG immediate release tablet Take 1 tablet (5 mg total) by mouth every 6 (six) hours as needed for severe pain. Patient not taking: Reported on 07/14/2022 06/25/22   Roxan Hockey, MD  pantoprazole (PROTONIX) 40 MG tablet Take 1 tablet (40 mg total)  by mouth daily. 06/26/22   Roxan Hockey, MD  QUEtiapine (SEROQUEL) 200 MG tablet Take 1 tablet (200 mg total) by mouth at bedtime. 06/25/22   Roxan Hockey, MD  rivaroxaban (XARELTO) 10 MG TABS tablet Take 1 tablet (10 mg total) by mouth daily. For DVT prophylaxis for 1 month Only 06/25/22   Emokpae, Courage, MD  senna-docusate (SENOKOT-S) 8.6-50 MG tablet Take 2 tablets by mouth at bedtime. Patient taking differently: Take 1-2 tablets by mouth at bedtime as needed for mild constipation. 06/25/22   Roxan Hockey, MD  thiamine (VITAMIN B1) 100 MG tablet Take 1 tablet (100 mg total) by mouth daily. 06/25/22   Roxan Hockey, MD  Vitamin D, Ergocalciferol, (DRISDOL) 1.25 MG (50000 UNIT) CAPS capsule Take 1 capsule (50,000 Units total) by mouth every 7 (seven) days. Patient not taking: Reported on 07/14/2022 06/25/22   Roxan Hockey, MD     Allergies    Citalopram   Review of Systems   Review of Systems Please see HPI for pertinent positives and negatives  Physical Exam BP 110/74 (BP Location: Right Arm)   Pulse 89   Temp 98 F (36.7 C) (Oral)   Resp 20   Ht '5\' 5"'$  (1.651 m)   Wt 78 kg   SpO2 94%   BMI 28.62 kg/m   Physical Exam Vitals and nursing note reviewed.  HENT:     Head: Normocephalic.     Nose: Nose normal.  Eyes:     Extraocular Movements: Extraocular movements intact.  Pulmonary:     Effort: Pulmonary effort is normal.  Musculoskeletal:        General: Normal range of motion.     Cervical back: Neck supple.  Skin:    Findings: No rash (on exposed skin).  Neurological:     Mental Status: He is alert and oriented to person, place, and time.  Psychiatric:        Mood and Affect: Mood normal.     ED Results / Procedures / Treatments   EKG None  Procedures Procedures  Medications Ordered in the ED Medications  HYDROcodone-acetaminophen (NORCO/VICODIN) 5-325 MG per tablet 1 tablet (has no administration in time range)    Initial Impression and  Plan  Patient here for re-evaluation of post-op leg pain. Advised there is no indication for further ED workup or admission and we do not have the ability to place him into SNF at this time of night on a weekend. He will be given a dose of Norco, advised to return to homeless shelter, follow up with his PCP to discuss SNF placement and with Ortho for long term post-op management.   ED Course       MDM Rules/Calculators/A&P Medical Decision Making Problems Addressed: Homelessness: chronic illness or injury Post-op pain: chronic illness or injury  Risk Prescription drug management.    Final Clinical Impression(s) / ED Diagnoses Final diagnoses:  Post-op pain  Homelessness    Rx / DC Orders ED Discharge Orders     None  Truddie Hidden, MD 07/15/22 0005

## 2022-07-16 ENCOUNTER — Encounter: Payer: Self-pay | Admitting: Orthopedic Surgery

## 2022-07-16 ENCOUNTER — Encounter: Payer: Self-pay | Admitting: Family Medicine

## 2022-07-16 ENCOUNTER — Encounter: Payer: Medicare Other | Admitting: Orthopedic Surgery

## 2022-07-16 NOTE — Telephone Encounter (Signed)
Dismissal letter sent.

## 2022-07-18 LAB — CULTURE, BLOOD (ROUTINE X 2)
Culture: NO GROWTH
Culture: NO GROWTH
Special Requests: ADEQUATE

## 2022-07-19 ENCOUNTER — Encounter: Payer: Self-pay | Admitting: Orthopedic Surgery

## 2022-07-19 ENCOUNTER — Ambulatory Visit (INDEPENDENT_AMBULATORY_CARE_PROVIDER_SITE_OTHER): Payer: Medicare Other

## 2022-07-19 ENCOUNTER — Ambulatory Visit (INDEPENDENT_AMBULATORY_CARE_PROVIDER_SITE_OTHER): Payer: Medicare Other | Admitting: Orthopedic Surgery

## 2022-07-19 DIAGNOSIS — S72145D Nondisplaced intertrochanteric fracture of left femur, subsequent encounter for closed fracture with routine healing: Secondary | ICD-10-CM | POA: Diagnosis not present

## 2022-07-19 MED ORDER — OXYCODONE HCL 5 MG PO TABS: 5.0000 mg | ORAL_TABLET | Freq: Four times a day (QID) | ORAL | 0 refills | Status: DC | PRN

## 2022-07-19 NOTE — Progress Notes (Signed)
Chief Complaint  Patient presents with   Post-op Follow-up    Hip left 06/19/22    Postop day 30 status post ORIF left hip with dynamic hip screw  Doing well ambulatory with a walker    He is in a lot of pain  X-ray shows no evidence of hardware complication fracture is healing well  Repeat x-ray in 3 weeks  Meds ordered this encounter  Medications   oxyCODONE (OXY IR/ROXICODONE) 5 MG immediate release tablet    Sig: Take 1 tablet (5 mg total) by mouth every 6 (six) hours as needed for up to 5 days for severe pain.    Dispense:  20 tablet    Refill:  0

## 2022-07-24 ENCOUNTER — Other Ambulatory Visit: Payer: Self-pay | Admitting: Orthopedic Surgery

## 2022-07-24 DIAGNOSIS — S72145D Nondisplaced intertrochanteric fracture of left femur, subsequent encounter for closed fracture with routine healing: Secondary | ICD-10-CM

## 2022-07-26 ENCOUNTER — Telehealth: Payer: Self-pay

## 2022-07-26 NOTE — Telephone Encounter (Signed)
For documentation purposes only.  Received returned mail with no forwarding address. Discharge letter from [facility] at [facility].  Cell phone disconnected or no longer in service.

## 2022-07-27 ENCOUNTER — Other Ambulatory Visit: Payer: Self-pay | Admitting: Orthopedic Surgery

## 2022-07-27 DIAGNOSIS — S72145D Nondisplaced intertrochanteric fracture of left femur, subsequent encounter for closed fracture with routine healing: Secondary | ICD-10-CM

## 2022-07-31 ENCOUNTER — Telehealth: Payer: Self-pay | Admitting: Orthopedic Surgery

## 2022-07-31 ENCOUNTER — Other Ambulatory Visit: Payer: Self-pay | Admitting: Orthopedic Surgery

## 2022-07-31 MED ORDER — HYDROCODONE-ACETAMINOPHEN 10-325 MG PO TABS: 1.0000 | ORAL_TABLET | Freq: Three times a day (TID) | ORAL | 0 refills | Status: AC | PRN

## 2022-07-31 NOTE — Telephone Encounter (Signed)
Ok thanks, I have tried to call him and no one answers, pain meds were sent to [place] earlier, appreciate you calling him.

## 2022-07-31 NOTE — Progress Notes (Signed)
Meds ordered this encounter  Medications   HYDROcodone-acetaminophen (NORCO) 10-325 MG tablet    Sig: Take 1 tablet by mouth every 8 (eight) hours as needed for up to 5 days.    Dispense:  15 tablet    Refill:  0

## 2022-07-31 NOTE — Telephone Encounter (Signed)
Patient called and left voicemail on triage phone. He is in a lot of pain, he is a post op patient.   Amy please call the patient.   Thank You

## 2022-07-31 NOTE — Telephone Encounter (Signed)
Can he come in tomorrow?

## 2022-07-31 NOTE — Telephone Encounter (Signed)
I called the patient and he can't come tomorrow, He doesn't know why no one can not call him from our office.   He needs something for pain and if he calls skats he has to have a 4 day notice.   He is hurting and he has called [place] twice.  The pain medicine he is taking is not working .    He thanked me for calling him, since no one will return his calls.

## 2022-08-01 ENCOUNTER — Ambulatory Visit (INDEPENDENT_AMBULATORY_CARE_PROVIDER_SITE_OTHER): Payer: Medicare Other | Admitting: Family Medicine

## 2022-08-01 ENCOUNTER — Encounter: Payer: Self-pay | Admitting: Family Medicine

## 2022-08-01 VITALS — BP 137/89 | HR 85 | Temp 98.2°F | Resp 12

## 2022-08-01 DIAGNOSIS — Z23 Encounter for immunization: Secondary | ICD-10-CM | POA: Diagnosis not present

## 2022-08-01 DIAGNOSIS — I951 Orthostatic hypotension: Secondary | ICD-10-CM | POA: Diagnosis not present

## 2022-08-01 DIAGNOSIS — F339 Major depressive disorder, recurrent, unspecified: Secondary | ICD-10-CM

## 2022-08-01 DIAGNOSIS — I10 Essential (primary) hypertension: Secondary | ICD-10-CM

## 2022-08-01 DIAGNOSIS — E538 Deficiency of other specified B group vitamins: Secondary | ICD-10-CM

## 2022-08-01 DIAGNOSIS — Z8781 Personal history of (healed) traumatic fracture: Secondary | ICD-10-CM | POA: Diagnosis not present

## 2022-08-01 DIAGNOSIS — F5101 Primary insomnia: Secondary | ICD-10-CM

## 2022-08-01 DIAGNOSIS — M25552 Pain in left hip: Secondary | ICD-10-CM

## 2022-08-01 DIAGNOSIS — F1020 Alcohol dependence, uncomplicated: Secondary | ICD-10-CM

## 2022-08-01 MED ORDER — CYANOCOBALAMIN 1000 MCG/ML IJ SOLN
1000.0000 ug | Freq: Once | INTRAMUSCULAR | Status: AC
  Administered 2022-08-01: 1000 ug via INTRAMUSCULAR
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
## 2022-08-06 ENCOUNTER — Other Ambulatory Visit: Payer: Self-pay | Admitting: Orthopedic Surgery

## 2022-08-07 ENCOUNTER — Other Ambulatory Visit: Payer: Self-pay | Admitting: Orthopedic Surgery

## 2022-08-07 MED ORDER — HYDROCODONE-ACETAMINOPHEN 5-325 MG PO TABS: 1.0000 | ORAL_TABLET | Freq: Three times a day (TID) | ORAL | 0 refills | Status: DC | PRN

## 2022-08-07 NOTE — Progress Notes (Signed)
Meds ordered this encounter  Medications   HYDROcodone-acetaminophen (NORCO/VICODIN) 5-325 MG tablet    Sig: Take 1 tablet by mouth every 8 (eight) hours as needed for up to 5 days for moderate pain.    Dispense:  15 tablet    Refill:  0

## 2022-08-09 ENCOUNTER — Encounter: Payer: Medicare Other | Admitting: Orthopedic Surgery

## 2022-08-09 ENCOUNTER — Telehealth: Payer: Self-pay | Admitting: Radiology

## 2022-08-09 NOTE — Telephone Encounter (Signed)
Noted, thanks!

## 2022-08-09 NOTE — Telephone Encounter (Signed)
Patient called, he tried to come in to appt today but cannot.  He is in some distress with his COPD, and can't make it.  He will call us back to reschedule.

## 2022-08-10 ENCOUNTER — Telehealth: Payer: Self-pay

## 2022-08-10 ENCOUNTER — Other Ambulatory Visit: Payer: Self-pay | Admitting: Orthopedic Surgery

## 2022-08-10 NOTE — Telephone Encounter (Signed)
Jacob Frank calling about trying to get prescription for [facility] for his Care Taker since July 2023, when he broke his hip.  Patient states he spoke to someone at [facility] and they told him to have his PCP write prescription saying [name] is patient's care taker. I spoke to Mr. Jacob Frank, his phone number is listed as patient's phone (patient doesn't have a personal cell at this time). Contact # is [phone]  Follow up with Patient on Monday.

## 2022-08-11 DIAGNOSIS — Z743 Need for continuous supervision: Secondary | ICD-10-CM | POA: Diagnosis not present

## 2022-08-11 DIAGNOSIS — I469 Cardiac arrest, cause unspecified: Secondary | ICD-10-CM | POA: Diagnosis not present

## 2022-08-13 DIAGNOSIS — J449 Chronic obstructive pulmonary disease, unspecified: Secondary | ICD-10-CM | POA: Diagnosis not present

## 2022-08-13 NOTE — Telephone Encounter (Signed)
Please confirm if this can be completed.

## 2022-08-15 ENCOUNTER — Encounter: Payer: Medicare Other | Admitting: Orthopedic Surgery

## 2022-08-30 ENCOUNTER — Ambulatory Visit: Payer: Medicare Other | Admitting: Family Medicine

## 2022-08-31 ENCOUNTER — Telehealth: Payer: Self-pay

## 2022-08-31 NOTE — Telephone Encounter (Signed)
Home health orders received 08/31/22 for [place] [facility] [place] health initiation orders: Yes.  Home health re-certification orders: No. Patient last seen by ordering physician for this condition: 08/01/22. Must be less than 90 days for re-certification and less than 30 days prior for initiation. Visit must have been for the condition the orders are being placed.  Patient meets criteria for Physician to sign orders: Yes.        Current med list has been attached: Yes        Orders placed on physicians desk for signature: 08/31/22 (date) If patient does not meet criteria for orders to be signed: pt was called to schedule appt. Appt is scheduled for n/a.   Beatrix Fetters

## 2022-08-31 DEATH — deceased

## 2022-10-09 ENCOUNTER — Telehealth: Payer: Self-pay | Admitting: Family Medicine

## 2022-10-09 NOTE — Telephone Encounter (Signed)
(  I received a call from Jacob Frank at [facility] today. She requested that I complete the death certificate for this patient. I did not know until now that he had passed. Date of death 08-15-22. I have completed the death certificate today.)   Britt: Please call Butch Penny at [facility] at [phone] and let her know if this is done. Thanks.

## 2022-10-09 NOTE — Telephone Encounter (Signed)
Spoke with Butch Penny and informed of completion. She mentioned on her end, it is still showing error code by cause of death. Please make sure no error codes on your end.

## 2022-11-01 ENCOUNTER — Ambulatory Visit: Payer: Medicare Other | Admitting: Family Medicine
